# Patient Record
Sex: Male | Born: 1965 | Race: Black or African American | Hispanic: No | Smoking: Current every day smoker
Health system: Southern US, Community
[De-identification: ages and names within clinical notes are randomized; demographics above are authoritative.]

## PROBLEM LIST (undated history)

## (undated) ENCOUNTER — Emergency Department (HOSPITAL_COMMUNITY): Discharge status: Absent without leave | Payer: Medicare Other

## (undated) DIAGNOSIS — M1990 Unspecified osteoarthritis, unspecified site: Secondary | ICD-10-CM

## (undated) DIAGNOSIS — T7840XA Allergy, unspecified, initial encounter: Secondary | ICD-10-CM

## (undated) DIAGNOSIS — I10 Essential (primary) hypertension: Secondary | ICD-10-CM

## (undated) DIAGNOSIS — F419 Anxiety disorder, unspecified: Secondary | ICD-10-CM

## (undated) DIAGNOSIS — R0602 Shortness of breath: Secondary | ICD-10-CM

## (undated) DIAGNOSIS — F1010 Alcohol abuse, uncomplicated: Secondary | ICD-10-CM

## (undated) DIAGNOSIS — F329 Major depressive disorder, single episode, unspecified: Secondary | ICD-10-CM

## (undated) DIAGNOSIS — F32A Depression, unspecified: Secondary | ICD-10-CM

## (undated) DIAGNOSIS — J449 Chronic obstructive pulmonary disease, unspecified: Secondary | ICD-10-CM

## (undated) DIAGNOSIS — J45909 Unspecified asthma, uncomplicated: Secondary | ICD-10-CM

## (undated) HISTORY — PX: BACK SURGERY: SHX140

## (undated) HISTORY — DX: Anxiety disorder, unspecified: F41.9

## (undated) HISTORY — PX: MANDIBLE FRACTURE SURGERY: SHX706

## (undated) HISTORY — DX: Allergy, unspecified, initial encounter: T78.40XA

## (undated) HISTORY — PX: HERNIA REPAIR: SHX51

## (undated) HISTORY — DX: Unspecified osteoarthritis, unspecified site: M19.90

## (undated) HISTORY — PX: HEMORRHOID SURGERY: SHX153

---

## 1998-05-11 ENCOUNTER — Emergency Department (HOSPITAL_COMMUNITY): Admission: EM | Admit: 1998-05-11 | Discharge: 1998-05-11 | Payer: Self-pay | Admitting: Internal Medicine

## 1998-12-08 ENCOUNTER — Emergency Department (HOSPITAL_COMMUNITY): Admission: EM | Admit: 1998-12-08 | Discharge: 1998-12-08 | Payer: Self-pay

## 1999-05-17 ENCOUNTER — Emergency Department (HOSPITAL_COMMUNITY): Admission: EM | Admit: 1999-05-17 | Discharge: 1999-05-18 | Payer: Self-pay | Admitting: Emergency Medicine

## 1999-05-17 ENCOUNTER — Encounter: Payer: Self-pay | Admitting: Emergency Medicine

## 1999-07-27 ENCOUNTER — Ambulatory Visit (HOSPITAL_COMMUNITY): Admission: RE | Admit: 1999-07-27 | Discharge: 1999-07-27 | Payer: Self-pay | Admitting: Orthopedic Surgery

## 1999-07-27 ENCOUNTER — Encounter: Payer: Self-pay | Admitting: Orthopedic Surgery

## 1999-12-21 ENCOUNTER — Emergency Department (HOSPITAL_COMMUNITY): Admission: EM | Admit: 1999-12-21 | Discharge: 1999-12-21 | Payer: Self-pay | Admitting: *Deleted

## 1999-12-30 ENCOUNTER — Encounter (INDEPENDENT_AMBULATORY_CARE_PROVIDER_SITE_OTHER): Payer: Self-pay | Admitting: *Deleted

## 1999-12-30 ENCOUNTER — Ambulatory Visit (HOSPITAL_BASED_OUTPATIENT_CLINIC_OR_DEPARTMENT_OTHER): Admission: RE | Admit: 1999-12-30 | Discharge: 1999-12-30 | Payer: Self-pay | Admitting: General Surgery

## 2000-02-02 ENCOUNTER — Emergency Department (HOSPITAL_COMMUNITY): Admission: EM | Admit: 2000-02-02 | Discharge: 2000-02-02 | Payer: Self-pay | Admitting: Emergency Medicine

## 2000-02-06 ENCOUNTER — Emergency Department (HOSPITAL_COMMUNITY): Admission: EM | Admit: 2000-02-06 | Discharge: 2000-02-06 | Payer: Self-pay | Admitting: *Deleted

## 2000-02-09 ENCOUNTER — Emergency Department (HOSPITAL_COMMUNITY): Admission: EM | Admit: 2000-02-09 | Discharge: 2000-02-09 | Payer: Self-pay | Admitting: Emergency Medicine

## 2000-05-27 ENCOUNTER — Emergency Department (HOSPITAL_COMMUNITY): Admission: EM | Admit: 2000-05-27 | Discharge: 2000-05-27 | Payer: Self-pay | Admitting: Emergency Medicine

## 2000-05-27 ENCOUNTER — Encounter: Payer: Self-pay | Admitting: Emergency Medicine

## 2000-06-24 ENCOUNTER — Emergency Department (HOSPITAL_COMMUNITY): Admission: EM | Admit: 2000-06-24 | Discharge: 2000-06-24 | Payer: Self-pay | Admitting: Emergency Medicine

## 2001-01-07 ENCOUNTER — Emergency Department (HOSPITAL_COMMUNITY): Admission: EM | Admit: 2001-01-07 | Discharge: 2001-01-08 | Payer: Self-pay | Admitting: Emergency Medicine

## 2001-03-13 ENCOUNTER — Emergency Department (HOSPITAL_COMMUNITY): Admission: EM | Admit: 2001-03-13 | Discharge: 2001-03-13 | Payer: Self-pay | Admitting: Internal Medicine

## 2001-04-30 ENCOUNTER — Emergency Department (HOSPITAL_COMMUNITY): Admission: EM | Admit: 2001-04-30 | Discharge: 2001-04-30 | Payer: Self-pay | Admitting: Emergency Medicine

## 2001-04-30 ENCOUNTER — Encounter: Payer: Self-pay | Admitting: Emergency Medicine

## 2001-10-19 ENCOUNTER — Emergency Department (HOSPITAL_COMMUNITY): Admission: EM | Admit: 2001-10-19 | Discharge: 2001-10-19 | Payer: Self-pay | Admitting: *Deleted

## 2002-04-11 ENCOUNTER — Emergency Department (HOSPITAL_COMMUNITY): Admission: EM | Admit: 2002-04-11 | Discharge: 2002-04-11 | Payer: Self-pay | Admitting: Emergency Medicine

## 2002-06-02 ENCOUNTER — Emergency Department (HOSPITAL_COMMUNITY): Admission: EM | Admit: 2002-06-02 | Discharge: 2002-06-02 | Payer: Self-pay | Admitting: Emergency Medicine

## 2002-06-11 ENCOUNTER — Emergency Department (HOSPITAL_COMMUNITY): Admission: EM | Admit: 2002-06-11 | Discharge: 2002-06-11 | Payer: Self-pay | Admitting: Emergency Medicine

## 2002-10-02 ENCOUNTER — Emergency Department (HOSPITAL_COMMUNITY): Admission: EM | Admit: 2002-10-02 | Discharge: 2002-10-02 | Payer: Self-pay | Admitting: Emergency Medicine

## 2002-12-18 ENCOUNTER — Encounter: Payer: Self-pay | Admitting: Emergency Medicine

## 2002-12-18 ENCOUNTER — Inpatient Hospital Stay (HOSPITAL_COMMUNITY): Admission: EM | Admit: 2002-12-18 | Discharge: 2002-12-20 | Payer: Self-pay | Admitting: Emergency Medicine

## 2002-12-19 ENCOUNTER — Encounter: Payer: Self-pay | Admitting: Oral Surgery

## 2003-02-02 ENCOUNTER — Encounter: Payer: Self-pay | Admitting: Emergency Medicine

## 2003-02-02 ENCOUNTER — Emergency Department (HOSPITAL_COMMUNITY): Admission: EM | Admit: 2003-02-02 | Discharge: 2003-02-02 | Payer: Self-pay | Admitting: Unknown Physician Specialty

## 2003-08-12 ENCOUNTER — Emergency Department (HOSPITAL_COMMUNITY): Admission: EM | Admit: 2003-08-12 | Discharge: 2003-08-12 | Payer: Self-pay | Admitting: Emergency Medicine

## 2003-08-20 ENCOUNTER — Emergency Department (HOSPITAL_COMMUNITY): Admission: EM | Admit: 2003-08-20 | Discharge: 2003-08-20 | Payer: Self-pay | Admitting: Emergency Medicine

## 2008-08-27 ENCOUNTER — Emergency Department (HOSPITAL_COMMUNITY): Admission: EM | Admit: 2008-08-27 | Discharge: 2008-08-27 | Payer: Self-pay | Admitting: Emergency Medicine

## 2009-08-19 ENCOUNTER — Emergency Department (HOSPITAL_COMMUNITY): Admission: EM | Admit: 2009-08-19 | Discharge: 2009-08-19 | Payer: Self-pay | Admitting: Emergency Medicine

## 2009-08-24 ENCOUNTER — Emergency Department (HOSPITAL_COMMUNITY): Admission: EM | Admit: 2009-08-24 | Discharge: 2009-08-24 | Payer: Self-pay | Admitting: Emergency Medicine

## 2010-01-24 ENCOUNTER — Emergency Department (HOSPITAL_COMMUNITY): Admission: EM | Admit: 2010-01-24 | Discharge: 2010-01-25 | Payer: Self-pay | Admitting: Emergency Medicine

## 2010-01-28 ENCOUNTER — Emergency Department (HOSPITAL_COMMUNITY): Admission: EM | Admit: 2010-01-28 | Discharge: 2010-01-28 | Payer: Self-pay | Admitting: Emergency Medicine

## 2010-02-04 ENCOUNTER — Emergency Department (HOSPITAL_COMMUNITY): Admission: EM | Admit: 2010-02-04 | Discharge: 2010-02-05 | Payer: Self-pay | Admitting: Emergency Medicine

## 2010-02-14 ENCOUNTER — Emergency Department (HOSPITAL_COMMUNITY): Admission: EM | Admit: 2010-02-14 | Discharge: 2010-02-14 | Payer: Self-pay | Admitting: Emergency Medicine

## 2010-03-18 ENCOUNTER — Emergency Department (HOSPITAL_COMMUNITY): Admission: EM | Admit: 2010-03-18 | Discharge: 2010-03-19 | Payer: Self-pay | Admitting: Emergency Medicine

## 2010-07-21 ENCOUNTER — Emergency Department (HOSPITAL_COMMUNITY)
Admission: EM | Admit: 2010-07-21 | Discharge: 2010-07-21 | Payer: Self-pay | Source: Home / Self Care | Admitting: Emergency Medicine

## 2010-11-05 LAB — BASIC METABOLIC PANEL
CO2: 27 mEq/L (ref 19–32)
Calcium: 8.8 mg/dL (ref 8.4–10.5)
GFR calc Af Amer: >60 mL/min (ref >60)
GFR calc non Af Amer: >60 mL/min (ref >60)
Glucose, Bld: 79 mg/dL (ref 70–99)
Potassium: 3.7 mEq/L (ref 3.5–5.1)
Sodium: 143 mEq/L (ref 135–145)

## 2010-11-05 LAB — DIFFERENTIAL
Lymphs Abs: 1.8 10*3/uL (ref 0.7–4.0)
Monocytes Absolute: 0.4 10*3/uL (ref 0.1–1.0)
Monocytes Relative: 8 % (ref 3–12)
Neutro Abs: 2.2 10*3/uL (ref 1.7–7.7)
Neutrophils Relative %: 49 % (ref 43–77)

## 2010-11-05 LAB — RAPID URINE DRUG SCREEN, HOSP PERFORMED
Cocaine: NOT DETECTED
Opiates: NOT DETECTED
Tetrahydrocannabinol: NOT DETECTED

## 2010-11-05 LAB — CBC
HCT: 39.7 % (ref 39.0–52.0)
Hemoglobin: 13.8 g/dL (ref 13.0–17.0)
MCH: 35 pg — ABNORMAL HIGH (ref 26.0–34.0)
MCHC: 34.9 g/dL (ref 30.0–36.0)
RBC: 3.96 MIL/uL — ABNORMAL LOW (ref 4.22–5.81)

## 2010-11-06 LAB — BASIC METABOLIC PANEL
BUN: 11 mg/dL (ref 6–23)
CO2: 25 mEq/L (ref 19–32)
Calcium: 9.1 mg/dL (ref 8.4–10.5)
Creatinine, Ser: 0.68 mg/dL (ref 0.4–1.5)
GFR calc non Af Amer: >60 mL/min (ref >60)
Glucose, Bld: 80 mg/dL (ref 70–99)

## 2010-11-06 LAB — HEPATIC FUNCTION PANEL
AST: 37 U/L (ref 0–37)
Albumin: 4.5 g/dL (ref 3.5–5.2)
Bilirubin, Direct: 0.1 mg/dL (ref 0.0–0.3)
Total Protein: 7.8 g/dL (ref 6.0–8.3)

## 2010-11-06 LAB — DIFFERENTIAL
Basophils Absolute: 0 10*3/uL (ref 0.0–0.1)
Basophils Relative: 0 % (ref 0–1)
Lymphocytes Relative: 24 % (ref 12–46)
Monocytes Absolute: 0.4 10*3/uL (ref 0.1–1.0)
Neutro Abs: 4.5 10*3/uL (ref 1.7–7.7)
Neutrophils Relative %: 68 % (ref 43–77)

## 2010-11-06 LAB — CBC
MCHC: 33.7 g/dL (ref 30.0–36.0)
Platelets: 282 10*3/uL (ref 150–400)
RDW: 14.2 % (ref 11.5–15.5)

## 2010-11-06 LAB — RAPID URINE DRUG SCREEN, HOSP PERFORMED
Amphetamines: NOT DETECTED
Barbiturates: NOT DETECTED
Benzodiazepines: NOT DETECTED
Opiates: NOT DETECTED
Tetrahydrocannabinol: POSITIVE — AB

## 2010-11-07 LAB — CBC
Hemoglobin: 11.6 g/dL — ABNORMAL LOW (ref 13.0–17.0)
MCHC: 33.9 g/dL (ref 30.0–36.0)
MCV: 103.6 fL — ABNORMAL HIGH (ref 78.0–100.0)
RBC: 3.3 MIL/uL — ABNORMAL LOW (ref 4.22–5.81)

## 2010-11-07 LAB — DIFFERENTIAL
Basophils Relative: 0 % (ref 0–1)
Eosinophils Absolute: 0.1 10*3/uL (ref 0.0–0.7)
Monocytes Absolute: 0.4 10*3/uL (ref 0.1–1.0)
Monocytes Relative: 8 % (ref 3–12)

## 2010-11-07 LAB — BASIC METABOLIC PANEL
CO2: 29 mEq/L (ref 19–32)
Chloride: 102 mEq/L (ref 96–112)
Creatinine, Ser: 0.69 mg/dL (ref 0.4–1.5)
GFR calc Af Amer: >60 mL/min (ref >60)
Glucose, Bld: 89 mg/dL (ref 70–99)
Sodium: 137 mEq/L (ref 135–145)

## 2010-11-07 LAB — RAPID URINE DRUG SCREEN, HOSP PERFORMED
Amphetamines: NOT DETECTED
Barbiturates: NOT DETECTED
Cocaine: NOT DETECTED
Opiates: NOT DETECTED

## 2010-11-18 ENCOUNTER — Emergency Department (HOSPITAL_COMMUNITY)
Admission: EM | Admit: 2010-11-18 | Discharge: 2010-11-18 | Disposition: A | Discharge status: Home / Self Care | Payer: Self-pay | Attending: Emergency Medicine | Admitting: Emergency Medicine

## 2010-11-18 DIAGNOSIS — F329 Major depressive disorder, single episode, unspecified: Secondary | ICD-10-CM | POA: Insufficient documentation

## 2010-11-18 DIAGNOSIS — K644 Residual hemorrhoidal skin tags: Secondary | ICD-10-CM | POA: Insufficient documentation

## 2010-11-18 DIAGNOSIS — F3289 Other specified depressive episodes: Secondary | ICD-10-CM | POA: Insufficient documentation

## 2010-11-18 DIAGNOSIS — K625 Hemorrhage of anus and rectum: Secondary | ICD-10-CM | POA: Insufficient documentation

## 2010-11-18 LAB — COMPREHENSIVE METABOLIC PANEL
AST: 52 U/L — ABNORMAL HIGH (ref 0–37)
Albumin: 4 g/dL (ref 3.5–5.2)
CO2: 28 mEq/L (ref 19–32)
Calcium: 8.7 mg/dL (ref 8.4–10.5)
Creatinine, Ser: 0.67 mg/dL (ref 0.4–1.5)
GFR calc Af Amer: >60 mL/min (ref >60)
GFR calc non Af Amer: >60 mL/min (ref >60)

## 2010-11-18 LAB — OCCULT BLOOD, POC DEVICE: Fecal Occult Bld: POSITIVE

## 2010-11-18 LAB — DIFFERENTIAL
Basophils Absolute: 0 10*3/uL (ref 0.0–0.1)
Basophils Relative: 1 % (ref 0–1)
Eosinophils Absolute: 0.2 10*3/uL (ref 0.0–0.7)
Eosinophils Relative: 5 % (ref 0–5)
Monocytes Absolute: 0.4 10*3/uL (ref 0.1–1.0)

## 2010-11-18 LAB — CBC
MCHC: 36.1 g/dL — ABNORMAL HIGH (ref 30.0–36.0)
RDW: 14 % (ref 11.5–15.5)

## 2010-11-19 ENCOUNTER — Emergency Department (HOSPITAL_COMMUNITY)
Admission: EM | Admit: 2010-11-19 | Discharge: 2010-11-19 | Disposition: A | Discharge status: Home / Self Care | Payer: Self-pay | Attending: Emergency Medicine | Admitting: Emergency Medicine

## 2010-11-19 DIAGNOSIS — K645 Perianal venous thrombosis: Secondary | ICD-10-CM | POA: Insufficient documentation

## 2010-11-19 DIAGNOSIS — K6289 Other specified diseases of anus and rectum: Secondary | ICD-10-CM | POA: Insufficient documentation

## 2010-11-19 DIAGNOSIS — K5900 Constipation, unspecified: Secondary | ICD-10-CM | POA: Insufficient documentation
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
## 2011-01-06 NOTE — Op Note (Signed)
[place]  Patient:    [name], [name]                     MRN: [id number] Proc. Date: 12/30/99 Adm. Date:  [id number] Disc. Date: [id number] Attending:  Ephriam Knuckles H                           Operative Report  PREOPERATIVE DIAGNOSIS:  Bleeding and painful hemorrhoids.  POSTOPERATIVE DIAGNOSIS:  Bleeding and painful hemorrhoids.  PROCEDURE:  Complete hemorrhoidectomy.  SURGEON:  Adolph Pollack, M.D.  ASSISTANT:  ANESTHESIA:  General.  INDICATIONS:  [name] is a 45 year old man who has had problems with hemorrhoids now that have been escalating.  These lead to bleeding, pain, and itching.  He has tried medical therapy, but this has failed and he is now here for hemorrhoidectomy.  He has significant hemorrhoidal disease in all three areas.  DESCRIPTION OF PROCEDURE:  He was placed supine on the operating table and a general anesthetic was administered.  He was then placed in the lithotomy position.  The perianal area was sterilely prepped and draped.  A left lateral hemorrhoid was grasped by its external component and retracted laterally after introduction of an anal speculum.  The area of excision of the hemorrhoid was marked with the cautery and then local anesthetic consisting of 0.5% Marcaine with epinephrine was infiltrated and the hemorrhoid was excised sharply.  The mucosa and anoderm were subsequently approximated after hemostasis was obtained with the cautery using a running locking 3-0 chromic suture.  Next, the right posterolateral area was approached and likewise had a sizeable hemorrhoid both in an internal and external component.  The external component was grasped and retracted laterally.  Using the Bovie, the areas of excision were marked and then ______ was infiltrated.  The hemorrhoid was then excised sharply.  The hemorrhoidal pedicle was ligated with 3-0 chromic suture and the mucosa and anoderm were closed  with a running 3-0 chromic suture.  Finally the right anterolateral hemorrhoid was identified with both an internal and external component.  The area of excision was marked with the cautery.  Then local anesthetic was infiltrated.  The hemorrhoidal pedicle was ligated with a 3-0 chromic suture and then the hemorrhoid was excised sharply. The mucosa and anoderm were closed with a running locking 3-0 chromic suture.  At the end of this procedure, he did not appear to have a significant anal tightening.  Everything was hemostatic.  A piece of Gelfoam was placed in the distal anus and a bulky dressing applied.  He tolerated the procedure well without any apparent complications and was taken to the recovery room in satisfactory condition. DD:  02/07/00 TD:  02/09/00 Job: [id number]

## 2011-01-13 ENCOUNTER — Inpatient Hospital Stay (INDEPENDENT_AMBULATORY_CARE_PROVIDER_SITE_OTHER)
Admission: RE | Admit: 2011-01-13 | Discharge: 2011-01-13 | Disposition: A | Discharge status: Home / Self Care | Payer: Self-pay | Source: Ambulatory Visit | Attending: Family Medicine | Admitting: Family Medicine

## 2011-01-13 DIAGNOSIS — K602 Anal fissure, unspecified: Secondary | ICD-10-CM

## 2011-02-05 ENCOUNTER — Inpatient Hospital Stay (INDEPENDENT_AMBULATORY_CARE_PROVIDER_SITE_OTHER)
Admission: RE | Admit: 2011-02-05 | Discharge: 2011-02-05 | Disposition: A | Discharge status: Home / Self Care | Payer: Self-pay | Source: Ambulatory Visit | Attending: Family Medicine | Admitting: Family Medicine

## 2011-02-05 DIAGNOSIS — K649 Unspecified hemorrhoids: Secondary | ICD-10-CM

## 2011-02-23 ENCOUNTER — Emergency Department (HOSPITAL_COMMUNITY): Payer: Self-pay

## 2011-02-23 ENCOUNTER — Emergency Department (HOSPITAL_COMMUNITY)
Admission: EM | Admit: 2011-02-23 | Discharge: 2011-02-23 | Disposition: A | Discharge status: Home / Self Care | Payer: Self-pay | Attending: Emergency Medicine | Admitting: Emergency Medicine

## 2011-02-23 DIAGNOSIS — G319 Degenerative disease of nervous system, unspecified: Secondary | ICD-10-CM | POA: Insufficient documentation

## 2011-02-23 DIAGNOSIS — R220 Localized swelling, mass and lump, head: Secondary | ICD-10-CM | POA: Insufficient documentation

## 2011-02-23 DIAGNOSIS — F17200 Nicotine dependence, unspecified, uncomplicated: Secondary | ICD-10-CM | POA: Insufficient documentation

## 2011-02-23 DIAGNOSIS — R221 Localized swelling, mass and lump, neck: Secondary | ICD-10-CM | POA: Insufficient documentation

## 2011-02-23 DIAGNOSIS — S060XAA Concussion with loss of consciousness status unknown, initial encounter: Secondary | ICD-10-CM | POA: Insufficient documentation

## 2011-02-23 DIAGNOSIS — S0180XA Unspecified open wound of other part of head, initial encounter: Secondary | ICD-10-CM | POA: Insufficient documentation

## 2011-02-23 DIAGNOSIS — S060X9A Concussion with loss of consciousness of unspecified duration, initial encounter: Secondary | ICD-10-CM | POA: Insufficient documentation

## 2012-01-22 ENCOUNTER — Encounter (HOSPITAL_COMMUNITY): Payer: Self-pay

## 2012-01-22 ENCOUNTER — Emergency Department (HOSPITAL_COMMUNITY)
Admission: EM | Admit: 2012-01-22 | Discharge: 2012-01-22 | Disposition: A | Discharge status: Home / Self Care | Payer: Self-pay | Attending: Emergency Medicine | Admitting: Emergency Medicine

## 2012-01-22 ENCOUNTER — Emergency Department (HOSPITAL_COMMUNITY): Payer: Self-pay

## 2012-01-22 DIAGNOSIS — M79609 Pain in unspecified limb: Secondary | ICD-10-CM | POA: Insufficient documentation

## 2012-01-22 DIAGNOSIS — F17200 Nicotine dependence, unspecified, uncomplicated: Secondary | ICD-10-CM | POA: Insufficient documentation

## 2012-01-22 DIAGNOSIS — M109 Gout, unspecified: Secondary | ICD-10-CM

## 2012-01-22 MED ORDER — DEXAMETHASONE SODIUM PHOSPHATE 10 MG/ML IJ SOLN
10.0000 mg | Freq: Once | INTRAMUSCULAR | Status: AC
  Administered 2012-01-22: 10 mg via INTRAMUSCULAR
  Filled 2012-01-22: qty 1

## 2012-01-22 MED ORDER — OXYCODONE-ACETAMINOPHEN 5-325 MG PO TABS
1.0000 | ORAL_TABLET | Freq: Once | ORAL | Status: AC
  Administered 2012-01-22: 1 via ORAL
  Filled 2012-01-22: qty 1

## 2012-01-22 MED ORDER — KETOROLAC TROMETHAMINE 60 MG/2ML IM SOLN
60.0000 mg | Freq: Once | INTRAMUSCULAR | Status: AC
  Administered 2012-01-22: 60 mg via INTRAMUSCULAR
  Filled 2012-01-22: qty 2

## 2012-01-22 MED ORDER — INDOMETHACIN 25 MG PO CAPS: 25.0000 mg | ORAL_CAPSULE | Freq: Three times a day (TID) | ORAL | Status: AC | PRN

## 2012-01-22 MED ORDER — HYDROCODONE-ACETAMINOPHEN 5-325 MG PO TABS: 1.0000 | ORAL_TABLET | Freq: Four times a day (QID) | ORAL | Status: AC | PRN

## 2012-01-22 NOTE — ED Provider Notes (Signed)
Medical screening examination/treatment/procedure(s) were performed by non-physician practitioner and as supervising physician I was immediately available for consultation/collaboration.   Koah Chisenhall, MD 01/22/12 1803 

## 2012-01-22 NOTE — ED Provider Notes (Signed)
History     CSN: [id number]  Arrival date & time 01/22/12  1128   First MD Initiated Contact with Patient 01/22/12 1211       12:37 PM HPI Patient reports pain in his left great toe that began last night. Reports swelling and redness. Denies known injury. Reports pain radiates to the entire foot. States very painful to ambulate.  Patient is a 46 y.o. male presenting with toe pain. The history is provided by the patient.  Toe Pain This is a new problem. The current episode started yesterday. The problem has been gradually worsening. Pertinent negatives include no fever, nausea, neck pain, numbness, vomiting or weakness. The symptoms are aggravated by walking and standing. He has tried nothing for the symptoms.    No past medical history on file.  No past surgical history on file.  No family history on file.  History  Substance Use Topics  . Smoking status: Current Everyday Smoker  . Smokeless tobacco: Not on file  . Alcohol Use: Yes      Review of Systems  Constitutional: Negative for fever.  HENT: Negative for neck pain.   Gastrointestinal: Negative for nausea and vomiting.  Musculoskeletal:       Toe pain  Skin: Negative for wound.  Neurological: Negative for weakness and numbness.  All other systems reviewed and are negative.    Allergies  Review of patient's allergies indicates no known allergies.  Home Medications  No current outpatient prescriptions on file.  BP 108/77  Pulse 89  Temp(Src) 98.1 F (36.7 C) (Oral)  Resp 18  SpO2 95%  Physical Exam  Constitutional: He is oriented to person, place, and time. He appears well-developed and well-nourished.  HENT:  Head: Normocephalic and atraumatic.  Eyes: Pupils are equal, round, and reactive to light.  Musculoskeletal:       Left great toe: erythema and edema of left MTP. TTP. Full ROM of foot and ankle. NML sensation and pedal pulse. Cap refill <2 secs  Neurological: He is alert and oriented to  person, place, and time.  Skin: Skin is warm and dry. No rash noted. No erythema. No pallor.  Psychiatric: He has a normal mood and affect. His behavior is normal.    ED Course  Procedures  Results for orders placed during the hospital encounter of 11/18/10  DIFFERENTIAL      Component Value Range   Neutrophils Relative 46  43 - 77 (%)   Neutro Abs 1.8  1.7 - 7.7 (K/uL)   Lymphocytes Relative 38  12 - 46 (%)   Lymphs Abs 1.4  0.7 - 4.0 (K/uL)   Monocytes Relative 11  3 - 12 (%)   Monocytes Absolute 0.4  0.1 - 1.0 (K/uL)   Eosinophils Relative 5  0 - 5 (%)   Eosinophils Absolute 0.2  0.0 - 0.7 (K/uL)   Basophils Relative 1  0 - 1 (%)   Basophils Absolute 0.0  0.0 - 0.1 (K/uL)  CBC      Component Value Range   WBC 3.8 (*) 4.0 - 10.5 (K/uL)   RBC 3.74 (*) 4.22 - 5.81 (MIL/uL)   Hemoglobin 13.3  13.0 - 17.0 (g/dL)   HCT 30.8 (*) 65.7 - 52.0 (%)   MCV 98.4  78.0 - 100.0 (fL)   MCH 35.6 (*) 26.0 - 34.0 (pg)   MCHC 36.1 (*) 30.0 - 36.0 (g/dL)   RDW 84.6  96.2 - 95.2 (%)   Platelets 210  150 -  400 (K/uL)  OCCULT BLOOD, POC      Component Value Range   Fecal Occult Bld POSITIVE    COMPREHENSIVE METABOLIC PANEL      Component Value Range   Sodium 147 (*) 135 - 145 (mEq/L)   Potassium 3.6  3.5 - 5.1 (mEq/L)   Chloride 109  96 - 112 (mEq/L)   CO2 28  19 - 32 (mEq/L)   Glucose, Bld 88  70 - 99 (mg/dL)   BUN 4 (*) 6 - 23 (mg/dL)   Creatinine, Ser 4.09  0.4 - 1.5 (mg/dL)   Calcium 8.7  8.4 - 81.1 (mg/dL)   Total Protein 7.1  6.0 - 8.3 (g/dL)   Albumin 4.0  3.5 - 5.2 (g/dL)   AST 52 (*) 0 - 37 (U/L)   ALT 43  0 - 53 (U/L)   Alkaline Phosphatase 52  39 - 117 (U/L)   Total Bilirubin 0.4  0.3 - 1.2 (mg/dL)   GFR calc non Af Amer >60  >60 (mL/min)   GFR calc Af Amer    >60 (mL/min)   Value: >60            The eGFR has been calculated     using the MDRD equation.     This calculation has not been     validated in all clinical     situations.     eGFR's persistently     <60  mL/min signify     possible Chronic Kidney Disease.   Dg Foot Complete Left  01/22/2012  *RADIOLOGY REPORT*  Clinical Data: Pain and distal left great toe since yesterday.  No known injury.  LEFT FOOT - COMPLETE 3+ VIEW  Comparison: None.  Findings: The joints are aligned.  The second through fifth toes are extended at the midportion of phalangeal joints, and flexed at the metatarsophalangeal joints.  There are degenerative changes at the first metatarsophalangeal joint, evidenced by joint space narrowing, subchondral sclerosis, and osteophyte formation. Negative for fracture.  No discrete soft tissue swelling appreciated.  IMPRESSION:  1.  Degenerative changes of the first metatarsophalangeal joint. 2.  No acute bony abnormality.  Original Report Authenticated By: Britta Mccreedy, M.D.     MDM    First MTP is edematous, erythematous, warm to touch. Suspect patient has gout. Will treat patient with indomethacin and analgesics advised followup primary care provider further evaluation if needed. Patient voices understanding and is ready for discharge      Thomasene Lot, Cordelia Poche 01/22/12 1326

## 2012-01-22 NOTE — ED Notes (Signed)
sts toe pain and swelling onset last night and worse today on right great toe

## 2012-01-22 NOTE — Progress Notes (Signed)
Orthopedic Tech Progress Note Patient Details:  [name] 1966/05/22 [id number]  Ortho Devices Type of Ortho Device: Crutches Ortho Device/Splint Interventions: Application   Layli Capshaw T 01/22/2012, 2:16 PM

## 2012-04-22 ENCOUNTER — Emergency Department (HOSPITAL_COMMUNITY)
Admission: EM | Admit: 2012-04-22 | Discharge: 2012-04-22 | Disposition: A | Discharge status: Home / Self Care | Payer: Self-pay | Attending: Emergency Medicine | Admitting: Emergency Medicine

## 2012-04-22 ENCOUNTER — Encounter (HOSPITAL_COMMUNITY): Payer: Self-pay | Admitting: Emergency Medicine

## 2012-04-22 DIAGNOSIS — R195 Other fecal abnormalities: Secondary | ICD-10-CM | POA: Insufficient documentation

## 2012-04-22 DIAGNOSIS — M79601 Pain in right arm: Secondary | ICD-10-CM

## 2012-04-22 DIAGNOSIS — F17200 Nicotine dependence, unspecified, uncomplicated: Secondary | ICD-10-CM | POA: Insufficient documentation

## 2012-04-22 DIAGNOSIS — M79609 Pain in unspecified limb: Secondary | ICD-10-CM | POA: Insufficient documentation

## 2012-04-22 MED ORDER — OXYCODONE-ACETAMINOPHEN 5-325 MG PO TABS
2.0000 | ORAL_TABLET | Freq: Once | ORAL | Status: AC
  Administered 2012-04-22: 2 via ORAL
  Filled 2012-04-22: qty 2

## 2012-04-22 MED ORDER — OXYCODONE-ACETAMINOPHEN 5-325 MG PO TABS: 1.0000 | ORAL_TABLET | Freq: Four times a day (QID) | ORAL | Status: AC | PRN

## 2012-04-22 NOTE — ED Provider Notes (Signed)
Medical screening examination/treatment/procedure(s) were performed by non-physician practitioner and as supervising physician I was immediately available for consultation/collaboration.   Laray Anger, DO 04/22/12 1933

## 2012-04-22 NOTE — ED Provider Notes (Signed)
History     CSN: [id number]  Arrival date & time 04/22/12  0547   First MD Initiated Contact with Patient 04/22/12 0800      Chief Complaint  Patient presents with  . Arm Pain    (Consider location/radiation/quality/duration/timing/severity/associated sxs/prior treatment) HPI Comments: [name] 46 y.o. male   The chief complaint is: Patient presents with:   Arm Pain   The patient has medical history significant for:   History reviewed. No pertinent past medical history.  Patient presents with a 4 week history of arm pain. He states that the pain is constant, 7/10, without radiation or transmission. Patient has taken 4 BC powder and 4 Advil a day with resolution. Patient states that this has caused him some bright red blood in his stool. Patient denies fever, chills, or night sweats. Denies NVD.       The history is provided by the patient.    History reviewed. No pertinent past medical history.  Past Surgical History  Procedure Date  . Hemorrhoid surgery   . Mandible fracture surgery   . Back surgery     No family history on file.  History  Substance Use Topics  . Smoking status: Current Everyday Smoker -- 1.5 packs/day  . Smokeless tobacco: Not on file  . Alcohol Use: 63.0 oz/week    105 Cans of beer per week      Review of Systems  Constitutional: Negative for fever, chills and diaphoresis.  Gastrointestinal: Positive for anal bleeding. Negative for nausea, vomiting, abdominal pain and diarrhea.  Musculoskeletal: Positive for myalgias and arthralgias.  All other systems reviewed and are negative.    Allergies  Review of patient's allergies indicates no known allergies.  Home Medications   Current Outpatient Rx  Name Route Sig Dispense Refill  . ASPIRIN-SALICYLAMIDE-CAFFEINE 650-195-33.3 MG PO PACK Oral Take 1 packet by mouth every 6 (six) hours as needed.    . IBUPROFEN 200 MG PO TABS Oral Take 800 mg by mouth every 6 (six) hours as  needed. For pain    . INDOMETHACIN 25 MG PO CAPS Oral Take 1 capsule (25 mg total) by mouth 3 (three) times daily as needed. 30 capsule 0    BP 117/81  Pulse 94  Temp 97.6 F (36.4 C) (Oral)  Resp 20  Ht 5\' 7"  (1.702 m)  Wt 130 lb (58.968 kg)  BMI 20.36 kg/m2  SpO2 98%  Physical Exam  Nursing note and vitals reviewed. Constitutional: He appears well-developed and well-nourished.  HENT:  Head: Normocephalic and atraumatic.  Mouth/Throat: Oropharynx is clear and moist.  Eyes: Conjunctivae and EOM are normal. No scleral icterus.  Neck: Normal range of motion.  Cardiovascular: Normal rate, regular rhythm and normal heart sounds.   Pulmonary/Chest: Effort normal and breath sounds normal.  Abdominal: Soft. Bowel sounds are normal. There is no tenderness.  Musculoskeletal: He exhibits tenderness.       Patient has pain with abduction, adduction, internal and external rotation.  Tenderness to pa;pation of the right bicep.  Neurological: He is alert.  Skin: Skin is warm and dry.    ED Course  Procedures (including critical care time)  Labs Reviewed - No data to display No results found. Results for orders placed during the hospital encounter of 11/18/10  DIFFERENTIAL      Component Value Range   Neutrophils Relative 46  43 - 77 %   Neutro Abs 1.8  1.7 - 7.7 K/uL   Lymphocytes Relative 38  12 - 46 %   Lymphs Abs 1.4  0.7 - 4.0 K/uL   Monocytes Relative 11  3 - 12 %   Monocytes Absolute 0.4  0.1 - 1.0 K/uL   Eosinophils Relative 5  0 - 5 %   Eosinophils Absolute 0.2  0.0 - 0.7 K/uL   Basophils Relative 1  0 - 1 %   Basophils Absolute 0.0  0.0 - 0.1 K/uL  CBC      Component Value Range   WBC 3.8 (*) 4.0 - 10.5 K/uL   RBC 3.74 (*) 4.22 - 5.81 MIL/uL   Hemoglobin 13.3  13.0 - 17.0 g/dL   HCT 16.1 (*) 09.6 - 04.5 %   MCV 98.4  78.0 - 100.0 fL   MCH 35.6 (*) 26.0 - 34.0 pg   MCHC 36.1 (*) 30.0 - 36.0 g/dL   RDW 40.9  81.1 - 91.4 %   Platelets 210  150 - 400 K/uL  OCCULT  BLOOD, POC      Component Value Range   Fecal Occult Bld POSITIVE    COMPREHENSIVE METABOLIC PANEL      Component Value Range   Sodium 147 (*) 135 - 145 mEq/L   Potassium 3.6  3.5 - 5.1 mEq/L   Chloride 109  96 - 112 mEq/L   CO2 28  19 - 32 mEq/L   Glucose, Bld 88  70 - 99 mg/dL   BUN 4 (*) 6 - 23 mg/dL   Creatinine, Ser 7.82  0.4 - 1.5 mg/dL   Calcium 8.7  8.4 - 95.6 mg/dL   Total Protein 7.1  6.0 - 8.3 g/dL   Albumin 4.0  3.5 - 5.2 g/dL   AST 52 (*) 0 - 37 U/L   ALT 43  0 - 53 U/L   Alkaline Phosphatase 52  39 - 117 U/L   Total Bilirubin 0.4  0.3 - 1.2 mg/dL   GFR calc non Af Amer >60  >60 mL/min   GFR calc Af Amer    >60 mL/min   Value: >60            The eGFR has been calculated     using the MDRD equation.     This calculation has not been     validated in all clinical     situations.     eGFR's persistently     <60 mL/min signify     possible Chronic Kidney Disease.     1. Arm pain, right       MDM  Patient presented with a 4 week history of right arm pain. Patient given pain medication in ED with improvement upon reassessment, Patient discharged on pain medication with referral to orthopedics. Physical exam findings suspicious for biceps tendinitis. Return precautions given. No red flags for rupture bicep tendon, cellulitis, or septic arthritis.        Pixie Casino, PA-C 04/22/12 [phone]

## 2012-04-22 NOTE — ED Notes (Signed)
Pt presents to ED c/o R arm pain. Pt states that his arm has been hurting for approx 4 weeks. Pt denies fall or injury. Pt reports that he came here today because he is tired of hurting. Pt also reports that he has taken "so many BC's that I bleed when I go to the bathroom." Pt last BM was around 2:30am with "blood in it." Pt denies SOB, but states that he has had been more tired lately. Pt alert and in NAD.

## 2012-04-23 LAB — POCT I-STAT, CHEM 8
Calcium, Ion: 1.15 mmol/L (ref 1.12–1.23)
Creatinine, Ser: 0.9 mg/dL (ref 0.50–1.35)
Glucose, Bld: 85 mg/dL (ref 70–99)
Hemoglobin: 15 g/dL (ref 13.0–17.0)
Potassium: 4.1 mEq/L (ref 3.5–5.1)

## 2012-09-01 ENCOUNTER — Emergency Department (HOSPITAL_COMMUNITY)
Admission: EM | Admit: 2012-09-01 | Discharge: 2012-09-02 | Disposition: A | Discharge status: Home / Self Care | Payer: Self-pay | Attending: Emergency Medicine | Admitting: Emergency Medicine

## 2012-09-01 ENCOUNTER — Encounter (HOSPITAL_COMMUNITY): Payer: Self-pay | Admitting: Emergency Medicine

## 2012-09-01 DIAGNOSIS — F1010 Alcohol abuse, uncomplicated: Secondary | ICD-10-CM | POA: Insufficient documentation

## 2012-09-01 DIAGNOSIS — F17200 Nicotine dependence, unspecified, uncomplicated: Secondary | ICD-10-CM | POA: Insufficient documentation

## 2012-09-01 LAB — CBC
Hemoglobin: 13.2 g/dL (ref 13.0–17.0)
MCH: 35.6 pg — ABNORMAL HIGH (ref 26.0–34.0)
MCV: 98.9 fL (ref 78.0–100.0)
Platelets: 206 10*3/uL (ref 150–400)
RBC: 3.71 MIL/uL — ABNORMAL LOW (ref 4.22–5.81)

## 2012-09-01 LAB — COMPREHENSIVE METABOLIC PANEL
CO2: 23 mEq/L (ref 19–32)
Calcium: 9 mg/dL (ref 8.4–10.5)
Creatinine, Ser: 0.55 mg/dL (ref 0.50–1.35)
GFR calc Af Amer: >90 mL/min (ref >90)
GFR calc non Af Amer: >90 mL/min (ref >90)
Glucose, Bld: 80 mg/dL (ref 70–99)

## 2012-09-01 LAB — RAPID URINE DRUG SCREEN, HOSP PERFORMED: Opiates: NOT DETECTED

## 2012-09-01 LAB — SALICYLATE LEVEL: Salicylate Lvl: 19.6 mg/dL (ref 2.8–20.0)

## 2012-09-01 MED ORDER — ZOLPIDEM TARTRATE 5 MG PO TABS: 5.0000 mg | ORAL_TABLET | Freq: Every evening | ORAL | Status: DC | PRN

## 2012-09-01 MED ORDER — ACETAMINOPHEN 325 MG PO TABS: 650.0000 mg | ORAL_TABLET | ORAL | Status: DC | PRN

## 2012-09-01 MED ORDER — THIAMINE HCL 100 MG/ML IJ SOLN: 100.0000 mg | Freq: Every day | INTRAMUSCULAR | Status: DC

## 2012-09-01 MED ORDER — FOLIC ACID 1 MG PO TABS
1.0000 mg | ORAL_TABLET | Freq: Every day | ORAL | Status: DC
  Administered 2012-09-01: 1 mg via ORAL
  Filled 2012-09-01: qty 1

## 2012-09-01 MED ORDER — ONDANSETRON HCL 4 MG PO TABS: 4.0000 mg | ORAL_TABLET | Freq: Three times a day (TID) | ORAL | Status: DC | PRN

## 2012-09-01 MED ORDER — ADULT MULTIVITAMIN W/MINERALS CH
1.0000 | ORAL_TABLET | Freq: Every day | ORAL | Status: DC
  Administered 2012-09-01: 1 via ORAL
  Filled 2012-09-01: qty 1

## 2012-09-01 MED ORDER — NICOTINE 21 MG/24HR TD PT24
21.0000 mg | MEDICATED_PATCH | Freq: Every day | TRANSDERMAL | Status: DC
  Filled 2012-09-01: qty 1

## 2012-09-01 MED ORDER — VITAMIN B-1 100 MG PO TABS
100.0000 mg | ORAL_TABLET | Freq: Every day | ORAL | Status: DC
  Administered 2012-09-01: 100 mg via ORAL
  Filled 2012-09-01: qty 1

## 2012-09-01 MED ORDER — LORAZEPAM 1 MG PO TABS
1.0000 mg | ORAL_TABLET | Freq: Four times a day (QID) | ORAL | Status: DC | PRN
  Filled 2012-09-01 (×4): qty 1

## 2012-09-01 MED ORDER — IBUPROFEN 200 MG PO TABS
600.0000 mg | ORAL_TABLET | Freq: Three times a day (TID) | ORAL | Status: DC | PRN
  Filled 2012-09-01: qty 3

## 2012-09-01 MED ORDER — LORAZEPAM 1 MG PO TABS
0.0000 mg | ORAL_TABLET | Freq: Four times a day (QID) | ORAL | Status: DC
  Administered 2012-09-01 – 2012-09-02 (×5): 1 mg via ORAL
  Filled 2012-09-01: qty 1

## 2012-09-01 MED ORDER — LORAZEPAM 2 MG/ML IJ SOLN: 1.0000 mg | Freq: Four times a day (QID) | INTRAMUSCULAR | Status: DC | PRN

## 2012-09-01 MED ORDER — ALUM & MAG HYDROXIDE-SIMETH 200-200-20 MG/5ML PO SUSP: 30.0000 mL | ORAL | Status: DC | PRN

## 2012-09-01 MED ORDER — LORAZEPAM 1 MG PO TABS: 0.0000 mg | ORAL_TABLET | Freq: Two times a day (BID) | ORAL | Status: DC

## 2012-09-01 NOTE — BH Assessment (Signed)
Assessment Note   [name] is an 47 y.o. male.   Pt requesting alcohol detox after 2 years of daily drinking of 10 (40s) per day.  Pt denied SI, HI and AVH.  Pt denies being on medication.  Pt last detox was 2 yrs ago at [facility].    Pt eye contact was good, dishelved, Ox3, speech slurred but coherent, sleep decrease 4 hours  Recommend alcohol detox.  Info will be faxed to [facility] once pt BAL is under 200  Axis I: Alcohol Dep Axis II: Deferred Axis III: History reviewed. No pertinent past medical history. Axis IV: other psychosocial or environmental problems, problems related to legal system/crime, problems related to social environment and problems with primary support group Axis V: 51-60 moderate symptoms  Past Medical History: History reviewed. No pertinent past medical history.  Past Surgical History  Procedure Date  . Hemorrhoid surgery   . Mandible fracture surgery   . Back surgery     Family History: History reviewed. No pertinent family history.  Social History:  reports that he has been smoking.  He does not have any smokeless tobacco history on file. He reports that he drinks about 63 ounces of alcohol per week. He reports that he does not use illicit drugs.  Additional Social History:  Alcohol / Drug Use Pain Medications: na Prescriptions: na Over the Counter: na History of alcohol / drug use?: Yes Substance #1 Name of Substance 1: alcohol 1 - Age of First Use: teen 1 - Amount (size/oz): 10 40x per day 1 - Frequency: daily 1 - Duration: 2 years 1 - Last Use / Amount: 09-02-11  CIWA: CIWA-Ar BP: 109/59 mmHg Pulse Rate: 98  Nausea and Vomiting: no nausea and no vomiting Tactile Disturbances: none Tremor: no tremor Auditory Disturbances: not present Paroxysmal Sweats: no sweat visible Visual Disturbances: not present Anxiety: two Headache, Fullness in Head: very mild Agitation: normal activity Orientation and Clouding of Sensorium: oriented and can do  serial additions CIWA-Ar Total: 3  COWS:    Allergies: No Known Allergies  Home Medications:  (Not in a hospital admission)  OB/GYN Status:  No LMP for male patient.  General Assessment Data Location of Assessment: [facility] Living Arrangements: Other relatives Can pt return to current living arrangement?: Yes Admission Status: Voluntary Is patient capable of signing voluntary admission?: Yes Transfer from: Acute Hospital Referral Source: MD  Education Status Is patient currently in school?: No  Risk to self Suicidal Ideation: No Suicidal Intent: No Is patient at risk for suicide?: No Suicidal Plan?: No Access to Means: No What has been your use of drugs/alcohol within the last 12 months?: no Previous Attempts/Gestures: No How many times?: 0  Other Self Harm Risks: na Triggers for Past Attempts: None known Intentional Self Injurious Behavior: None Family Suicide History: No Recent stressful life event(s): Other (Comment) Persecutory voices/beliefs?: No Depression: No Substance abuse history and/or treatment for substance abuse?: No Suicide prevention information given to non-admitted patients: Not applicable  Risk to Others Homicidal Ideation: No Thoughts of Harm to Others: No Current Homicidal Intent: No Current Homicidal Plan: No Access to Homicidal Means: No Identified Victim: 0 History of harm to others?: No Assessment of Violence: None Noted Violent Behavior Description: cooperatives Does patient have access to weapons?: No Criminal Charges Pending?: No Does patient have a court date: No  Psychosis Hallucinations: None noted Delusions: None noted  Mental Status Report Appear/Hygiene: Disheveled Eye Contact: Fair Motor Activity: Unremarkable Speech: Logical/coherent Level of  Consciousness: Alert Mood: Worthless, low self-esteem Affect: Preoccupied Anxiety Level: Minimal Thought Processes: Relevant Judgement: Unimpaired Orientation:  Person;Time;Situation Obsessive Compulsive Thoughts/Behaviors: None  Cognitive Functioning Concentration: Decreased Memory: Remote Intact;Recent Intact IQ: Average Insight: Fair Impulse Control: Fair Appetite: Poor Weight Loss: 0  Weight Gain: 0  Sleep: Decreased Total Hours of Sleep: 4  Vegetative Symptoms: None  ADLScreening [facility]) Patient's cognitive ability adequate to safely complete daily activities?: Yes Patient able to express need for assistance with ADLs?: Yes Independently performs ADLs?: Yes (appropriate for developmental age)  Abuse/Neglect [facility]) Physical Abuse: Denies Verbal Abuse: Denies Sexual Abuse: Denies  Prior Inpatient Therapy Prior Inpatient Therapy: Yes Prior Therapy Dates: 2011 Prior Therapy Facilty/Provider(s): [facility] Reason for Treatment: detox  Prior Outpatient Therapy Prior Outpatient Therapy: No Prior Therapy Dates: na Prior Therapy Facilty/Provider(s): na Reason for Treatment: na  ADL Screening (condition at time of admission) Patient's cognitive ability adequate to safely complete daily activities?: Yes Patient able to express need for assistance with ADLs?: Yes Independently performs ADLs?: Yes (appropriate for developmental age) Weakness of Legs: None Weakness of Arms/Hands: None  Home Assistive Devices/Equipment Home Assistive Devices/Equipment: None  Therapy Consults (therapy consults require a physician order) PT Evaluation Needed: No OT Evalulation Needed: No SLP Evaluation Needed: No Abuse/Neglect Assessment (Assessment to be complete while patient is alone) Physical Abuse: Denies Verbal Abuse: Denies Sexual Abuse: Denies Exploitation of patient/patient's resources: Denies Self-Neglect: Denies Values / Beliefs Cultural Requests During Hospitalization: None Spiritual Requests During Hospitalization: None Consults Spiritual Care Consult Needed: No Social Work Consult Needed: No Merchant navy officer (For  Healthcare) Advance Directive: Patient does not have advance directive Pre-existing out of facility DNR order (yellow form or pink MOST form): No Nutrition Screen- [facility] Adult/[facility] Have you recently lost weight without trying?: No Have you been eating poorly because of a decreased appetite?: No Malnutrition Screening Tool Score: 0   Additional Information 1:1 In Past 12 Months?: No CIRT Risk: No Elopement Risk: No Does patient have medical clearance?: No     Disposition:   Alcohol detox recommended.  Pursue [facility]. Disposition Disposition of Patient: Inpatient treatment program Type of inpatient treatment program: Adult  On Site Evaluation by:   Reviewed with Physician:     Titus Mould, Eppie Gibson 09/01/2012 2:25 PM

## 2012-09-01 NOTE — ED Notes (Signed)
Pt here for detox from alcohol.  Pt states he drank 2 40 oz beers this am around 7.  Pt states he is tired of drinking and wants some help.

## 2012-09-01 NOTE — ED Notes (Signed)
Pt's O2 sat 87% to 91% on RA; O2 at 2L via [place] placed on patient; Pt's O2 sat 96% on 2L O2.

## 2012-09-01 NOTE — ED Provider Notes (Addendum)
History     CSN: [id number]  Arrival date & time 09/01/12  0843   First MD Initiated Contact with Patient 09/01/12 [phone]      No chief complaint on file.   (Consider location/radiation/quality/duration/timing/severity/associated sxs/prior treatment) Patient is a 47 y.o. male presenting with alcohol problem. The history is provided by the patient.  Alcohol Problem This is a chronic problem. Episode onset: years. The problem occurs constantly. The problem has not changed since onset.Pertinent negatives include no chest pain, no abdominal pain, no headaches and no shortness of breath. Nothing aggravates the symptoms. Nothing relieves the symptoms. He has tried nothing for the symptoms. The treatment provided no relief.    No past medical history on file.  Past Surgical History  Procedure Date  . Hemorrhoid surgery   . Mandible fracture surgery   . Back surgery     No family history on file.  History  Substance Use Topics  . Smoking status: Current Every Day Smoker -- 1.5 packs/day  . Smokeless tobacco: Not on file  . Alcohol Use: 63.0 oz/week    105 Cans of beer per week      Review of Systems  Respiratory: Negative for shortness of breath.   Cardiovascular: Negative for chest pain.  Gastrointestinal: Negative for abdominal pain.  Neurological: Negative for headaches.  All other systems reviewed and are negative.    Allergies  Review of patient's allergies indicates no known allergies.  Home Medications   Current Outpatient Rx  Name  Route  Sig  Dispense  Refill  . ASPIRIN-SALICYLAMIDE-CAFFEINE 650-195-33.3 MG PO PACK   Oral   Take 1 packet by mouth every 6 (six) hours as needed.         . IBUPROFEN 200 MG PO TABS   Oral   Take 800 mg by mouth every 6 (six) hours as needed. For pain           There were no vitals taken for this visit.  Physical Exam  Nursing note and vitals reviewed. Constitutional: He is oriented to person, place, and time. He  appears well-developed and well-nourished. No distress.  HENT:  Head: Normocephalic and atraumatic.  Mouth/Throat: Oropharynx is clear and moist.  Eyes: Conjunctivae normal and EOM are normal. Pupils are equal, round, and reactive to light.  Neck: Normal range of motion. Neck supple.  Cardiovascular: Normal rate, regular rhythm and intact distal pulses.   No murmur heard. Pulmonary/Chest: Effort normal and breath sounds normal. No respiratory distress. He has no wheezes. He has no rales.  Abdominal: Soft. He exhibits no distension. There is no tenderness. There is no rebound and no guarding.  Musculoskeletal: Normal range of motion. He exhibits no edema and no tenderness.  Neurological: He is alert and oriented to person, place, and time.  Skin: Skin is warm and dry. No rash noted. No erythema.  Psychiatric: He has a normal mood and affect. His behavior is normal.    ED Course  Procedures (including critical care time)   Labs Reviewed  ACETAMINOPHEN LEVEL  CBC  COMPREHENSIVE METABOLIC PANEL  ETHANOL  SALICYLATE LEVEL  URINE RAPID DRUG SCREEN (HOSP PERFORMED)   No results found.   1. Alcohol abuse       MDM   Patient is here for alcohol detox. He denies any medical problems and does not take medications regularly. He states he drank 240 ounce beers this morning because he was shaky. He denies history of seizures from alcohol withdrawal but states he  does get very shaky. Last time he attempted to stop drinking was approximately 2 or 3 years ago. States he drinks 7 to 840s daily. Currently on exam he is medically cleared. Medical clearance labs sent in patient sent to the psych ED.  Pt placed on CIWA       Gwyneth Sprout, MD 09/01/12 0930  Gwyneth Sprout, MD 09/01/12 [phone]

## 2012-09-01 NOTE — ED Notes (Signed)
Secretary to order breakfast tray for pt.

## 2012-09-01 NOTE — ED Notes (Addendum)
Pt calm and cooperative, denies SI, HI.  Pt's belongings locked up in # 31  Locker.

## 2012-09-01 NOTE — Progress Notes (Signed)
Spoke to [facility] at [place] and there are no beds available at present time.  ACT can contact [facility] after shift change 2000 or in the AM to pursue placement.  Pt did not want to go to [facility] as first choice but is cooperative and wants placement.  Pt wants [facility] and last placement there was 2 yrs ago.  Pt reported [facility] helped him.

## 2012-09-02 NOTE — BHH Counselor (Signed)
Faxed referral to Pat at [facility]. Spoke with pt who says he has a court date am of 09/03/12. Therefore he won't be accepted at [facility] as they don't take patients off campus to attend court during detox. He can start admission process again after court date is he is still interested according to [facility].

## 2012-09-02 NOTE — ED Notes (Signed)
Patient is resting comfortably. 

## 2012-09-02 NOTE — ED Notes (Signed)
Pt discharged home. Denies SI/HI. States has court date tomorrow.

## 2012-09-02 NOTE — ED Notes (Addendum)
Reviewed instructions for discharge.  Pt tremulous with e signature. Reporting mild withdrawal symptoms.

## 2012-09-02 NOTE — BH Assessment (Signed)
[facility] Assessment Progress Note   1/13 0437 Referral faxed to [facility] as they now have beds. Pt has court date am 09/03/12 so can't go to [facility]. Conctacted [facility] and per Larita Fife they will also not be able to accepted patient due to his court date. Writer met with patient to inform him that [facility] and [facility] will not be able to assist him at this time due to his legal issues. Patient asked to go home and stated that he would follow up with his court date tomorrow and seek treatment at another time. Writer provide patient with several community referrals that would be able to assist him with substance abuse treatment.

## 2012-09-10 ENCOUNTER — Emergency Department (HOSPITAL_COMMUNITY)
Admission: EM | Admit: 2012-09-10 | Discharge: 2012-09-11 | Disposition: A | Discharge status: Home / Self Care | Payer: Self-pay | Attending: Emergency Medicine | Admitting: Emergency Medicine

## 2012-09-10 ENCOUNTER — Encounter (HOSPITAL_COMMUNITY): Payer: Self-pay | Admitting: Emergency Medicine

## 2012-09-10 ENCOUNTER — Emergency Department (HOSPITAL_COMMUNITY): Payer: Self-pay

## 2012-09-10 DIAGNOSIS — F17200 Nicotine dependence, unspecified, uncomplicated: Secondary | ICD-10-CM | POA: Insufficient documentation

## 2012-09-10 DIAGNOSIS — F1010 Alcohol abuse, uncomplicated: Secondary | ICD-10-CM

## 2012-09-10 DIAGNOSIS — J3489 Other specified disorders of nose and nasal sinuses: Secondary | ICD-10-CM | POA: Insufficient documentation

## 2012-09-10 DIAGNOSIS — R0602 Shortness of breath: Secondary | ICD-10-CM | POA: Insufficient documentation

## 2012-09-10 DIAGNOSIS — R05 Cough: Secondary | ICD-10-CM | POA: Insufficient documentation

## 2012-09-10 DIAGNOSIS — F10929 Alcohol use, unspecified with intoxication, unspecified: Secondary | ICD-10-CM

## 2012-09-10 DIAGNOSIS — I10 Essential (primary) hypertension: Secondary | ICD-10-CM | POA: Insufficient documentation

## 2012-09-10 DIAGNOSIS — R5383 Other fatigue: Secondary | ICD-10-CM | POA: Insufficient documentation

## 2012-09-10 DIAGNOSIS — Z7982 Long term (current) use of aspirin: Secondary | ICD-10-CM | POA: Insufficient documentation

## 2012-09-10 DIAGNOSIS — R509 Fever, unspecified: Secondary | ICD-10-CM | POA: Insufficient documentation

## 2012-09-10 DIAGNOSIS — R630 Anorexia: Secondary | ICD-10-CM | POA: Insufficient documentation

## 2012-09-10 DIAGNOSIS — J189 Pneumonia, unspecified organism: Secondary | ICD-10-CM

## 2012-09-10 DIAGNOSIS — R059 Cough, unspecified: Secondary | ICD-10-CM | POA: Insufficient documentation

## 2012-09-10 DIAGNOSIS — R5381 Other malaise: Secondary | ICD-10-CM | POA: Insufficient documentation

## 2012-09-10 HISTORY — DX: Essential (primary) hypertension: I10

## 2012-09-10 LAB — CBC WITH DIFFERENTIAL/PLATELET
Basophils Relative: 2 % — ABNORMAL HIGH (ref 0–1)
HCT: 36 % — ABNORMAL LOW (ref 39.0–52.0)
Hemoglobin: 13 g/dL (ref 13.0–17.0)
Lymphocytes Relative: 38 % (ref 12–46)
Lymphs Abs: 2 10*3/uL (ref 0.7–4.0)
MCHC: 36.1 g/dL — ABNORMAL HIGH (ref 30.0–36.0)
Monocytes Absolute: 0.7 10*3/uL (ref 0.1–1.0)
Monocytes Relative: 13 % — ABNORMAL HIGH (ref 3–12)
Neutro Abs: 2.3 10*3/uL (ref 1.7–7.7)
Neutrophils Relative %: 44 % (ref 43–77)
RBC: 3.64 MIL/uL — ABNORMAL LOW (ref 4.22–5.81)
WBC: 5.2 10*3/uL (ref 4.0–10.5)

## 2012-09-10 LAB — RAPID URINE DRUG SCREEN, HOSP PERFORMED
Amphetamines: NOT DETECTED
Barbiturates: NOT DETECTED
Benzodiazepines: NOT DETECTED
Cocaine: NOT DETECTED

## 2012-09-10 LAB — BASIC METABOLIC PANEL
BUN: 3 mg/dL — ABNORMAL LOW (ref 6–23)
CO2: 22 mEq/L (ref 19–32)
Chloride: 94 mEq/L — ABNORMAL LOW (ref 96–112)
Creatinine, Ser: 0.52 mg/dL (ref 0.50–1.35)
GFR calc Af Amer: >90 mL/min (ref >90)
Potassium: 3.2 mEq/L — ABNORMAL LOW (ref 3.5–5.1)

## 2012-09-10 MED ORDER — CEFTRIAXONE SODIUM 1 G IJ SOLR
1.0000 g | Freq: Once | INTRAMUSCULAR | Status: AC
  Administered 2012-09-10: 1 g via INTRAMUSCULAR
  Filled 2012-09-10: qty 10

## 2012-09-10 MED ORDER — AZITHROMYCIN 250 MG PO TABS
500.0000 mg | ORAL_TABLET | Freq: Once | ORAL | Status: AC
  Administered 2012-09-10: 500 mg via ORAL
  Filled 2012-09-10: qty 2

## 2012-09-10 MED ORDER — BENZONATATE 100 MG PO CAPS
100.0000 mg | ORAL_CAPSULE | Freq: Once | ORAL | Status: AC
  Administered 2012-09-10: 100 mg via ORAL
  Filled 2012-09-10: qty 1

## 2012-09-10 NOTE — ED Provider Notes (Signed)
History     CSN: [id number]  Arrival date & time 09/10/12  2125   First MD Initiated Contact with Patient 09/10/12 2206      Chief Complaint  Patient presents with  . Generalized Body Aches  . Cough  . Chills   HPI  History provided by the patient. Patient is a 47 year old male with history of hypertension and alcohol abuse who presents with complaints of cough, fatigue and body aches. Patient states symptoms have been present for the past 3 weeks. Cough has been productive of yellow sputum. Denies hemoptysis. Symptoms have also been associated with some congestion. He denies any body aches. Does have decreased appetite. Denies diarrhea. Patient has been trying over-the-counter cough and cold medicines without relief. Denies any known sick contacts. Patient also states that he was planning to go to our cuff 1-2 weeks ago but did not end up following through. He is also requesting to go to our cuff this time for help with alcohol detox. Denies any depression, denies any SI or HI. Last drink was earlier today prior to arrival.    Past Medical History  Diagnosis Date  . Hypertension     Past Surgical History  Procedure Date  . Hemorrhoid surgery   . Mandible fracture surgery   . Back surgery   . Hernia repair     No family history on file.  History  Substance Use Topics  . Smoking status: Current Every Day Smoker -- 1.5 packs/day  . Smokeless tobacco: Not on file  . Alcohol Use: 63.0 oz/week    105 Cans of beer per week      Review of Systems  Constitutional: Positive for fever and chills. Negative for appetite change.  HENT: Positive for congestion and rhinorrhea. Negative for sore throat.   Respiratory: Positive for cough and shortness of breath. Negative for wheezing.   Cardiovascular: Negative for chest pain and palpitations.  Gastrointestinal: Negative for nausea, vomiting, abdominal pain and diarrhea.  Musculoskeletal: Negative for myalgias.  All other systems  reviewed and are negative.    Allergies  Review of patient's allergies indicates no known allergies.  Home Medications   Current Outpatient Rx  Name  Route  Sig  Dispense  Refill  . ASPIRIN-SALICYLAMIDE-CAFFEINE 650-195-33.3 MG PO PACK   Oral   Take 1-2 packets by mouth every 6 (six) hours as needed.          . GUAIFENESIN 100 MG/5ML PO SYRP   Oral   Take 200 mg by mouth 3 (three) times daily as needed. For cough           BP 123/80  Pulse 91  Temp 98.5 F (36.9 C) (Oral)  Resp 23  SpO2 98%  Physical Exam  Nursing note and vitals reviewed. Constitutional: He is oriented to person, place, and time. He appears well-developed and well-nourished. No distress.  HENT:  Head: Normocephalic.  Mouth/Throat: Oropharynx is clear and moist.  Neck: Normal range of motion. Neck supple.       No meningeal sign  Cardiovascular: Normal rate and regular rhythm.   No murmur heard. Pulmonary/Chest: Effort normal and breath sounds normal. No respiratory distress. He has no wheezes. He has no rales.  Abdominal: Soft. There is no tenderness. There is no rebound and no guarding.  Neurological: He is alert and oriented to person, place, and time.  Skin: Skin is warm.  Psychiatric: He has a normal mood and affect. His behavior is normal.    ED  Course  Procedures   Results for orders placed during the hospital encounter of 09/10/12  CBC WITH DIFFERENTIAL      Component Value Range   WBC 5.2  4.0 - 10.5 K/uL   RBC 3.64 (*) 4.22 - 5.81 MIL/uL   Hemoglobin 13.0  13.0 - 17.0 g/dL   HCT 16.1 (*) 09.6 - 04.5 %   MCV 98.9  78.0 - 100.0 fL   MCH 35.7 (*) 26.0 - 34.0 pg   MCHC 36.1 (*) 30.0 - 36.0 g/dL   RDW 40.9  81.1 - 91.4 %   Platelets 203  150 - 400 K/uL   Neutrophils Relative 44  43 - 77 %   Neutro Abs 2.3  1.7 - 7.7 K/uL   Lymphocytes Relative 38  12 - 46 %   Lymphs Abs 2.0  0.7 - 4.0 K/uL   Monocytes Relative 13 (*) 3 - 12 %   Monocytes Absolute 0.7  0.1 - 1.0 K/uL    Eosinophils Relative 4  0 - 5 %   Eosinophils Absolute 0.2  0.0 - 0.7 K/uL   Basophils Relative 2 (*) 0 - 1 %   Basophils Absolute 0.1  0.0 - 0.1 K/uL  BASIC METABOLIC PANEL      Component Value Range   Sodium 132 (*) 135 - 145 mEq/L   Potassium 3.2 (*) 3.5 - 5.1 mEq/L   Chloride 94 (*) 96 - 112 mEq/L   CO2 22  19 - 32 mEq/L   Glucose, Bld 87  70 - 99 mg/dL   BUN 3 (*) 6 - 23 mg/dL   Creatinine, Ser 7.82  0.50 - 1.35 mg/dL   Calcium 9.2  8.4 - 95.6 mg/dL   GFR calc non Af Amer >90  >90 mL/min   GFR calc Af Amer >90  >90 mL/min  ETHANOL      Component Value Range   Alcohol, Ethyl (B) 282 (*) 0 - 11 mg/dL  URINE RAPID DRUG SCREEN (HOSP PERFORMED)      Component Value Range   Opiates NONE DETECTED  NONE DETECTED   Cocaine NONE DETECTED  NONE DETECTED   Benzodiazepines NONE DETECTED  NONE DETECTED   Amphetamines NONE DETECTED  NONE DETECTED   Tetrahydrocannabinol NONE DETECTED  NONE DETECTED   Barbiturates NONE DETECTED  NONE DETECTED       Dg Chest 2 View  09/10/2012  *RADIOLOGY REPORT*  Clinical Data: Cough.  CHEST - 2 VIEW  Comparison: Chest radiograph performed 07/21/2010  Findings: The lungs are well-aerated.  Mild left basilar opacity may reflect atelectasis or possibly pneumonia.  There is no evidence of pleural effusion or pneumothorax.  The heart is normal in size; the mediastinal contour is within normal limits.  No acute osseous abnormalities are seen.  IMPRESSION: Mild left basilar airspace opacity may reflect atelectasis or possibly mild pneumonia.   Original Report Authenticated By: Tonia Ghent, M.D.      1. CAP (community acquired pneumonia)   2. Alcohol abuse   3. Alcohol intoxication       MDM  10:20 PM patient seen and evaluated. Patient with coughing but otherwise appears well resting complaint bed.   X-ray with signs of left basilar opacity. Will treat for possible pneumonia.   spoke with Aurther Loft with [facility] act team.  She will see patient and evaluate  for possible placement for alcohol detox. Patient will be moved to psych ED when available. Psych holding orders and alcohol detox protocol in place.  Angus Seller, [place] 09/11/12 [phone]

## 2012-09-10 NOTE — ED Notes (Signed)
Bed:WA19<BR> Expected date:<BR> Expected time:<BR> Means of arrival:<BR> Comments:<BR> EMS

## 2012-09-10 NOTE — ED Notes (Signed)
Per EMS: Pt c/o flu like symptoms for three weeks that include generalized body aches and cough. Pt has tried OTC cough medicine that did not relieve symptoms.

## 2012-09-11 MED ORDER — LORAZEPAM 1 MG PO TABS: 0.0000 mg | ORAL_TABLET | Freq: Two times a day (BID) | ORAL | Status: DC

## 2012-09-11 MED ORDER — VITAMIN B-1 100 MG PO TABS: 100.0000 mg | ORAL_TABLET | Freq: Every day | ORAL | Status: DC

## 2012-09-11 MED ORDER — IBUPROFEN 200 MG PO TABS
600.0000 mg | ORAL_TABLET | Freq: Three times a day (TID) | ORAL | Status: DC | PRN
  Administered 2012-09-11: 600 mg via ORAL
  Filled 2012-09-11: qty 3

## 2012-09-11 MED ORDER — ADULT MULTIVITAMIN W/MINERALS CH: 1.0000 | ORAL_TABLET | Freq: Every day | ORAL | Status: DC

## 2012-09-11 MED ORDER — AZITHROMYCIN 250 MG PO TABS: 250.0000 mg | ORAL_TABLET | Freq: Every day | ORAL | Status: DC

## 2012-09-11 MED ORDER — ALBUTEROL SULFATE HFA 108 (90 BASE) MCG/ACT IN AERS
2.0000 | INHALATION_SPRAY | RESPIRATORY_TRACT | Status: DC | PRN
  Administered 2012-09-11: 2 via RESPIRATORY_TRACT
  Filled 2012-09-11: qty 6.7

## 2012-09-11 MED ORDER — LEVOFLOXACIN 500 MG PO TABS
500.0000 mg | ORAL_TABLET | Freq: Once | ORAL | Status: AC
Start: 2012-09-11 — End: 2012-09-11
  Administered 2012-09-11: 500 mg via ORAL
  Filled 2012-09-11: qty 1

## 2012-09-11 MED ORDER — HYDROCOD POLST-CHLORPHEN POLST 10-8 MG/5ML PO LQCR
5.0000 mL | Freq: Once | ORAL | Status: AC
  Administered 2012-09-11: 5 mL via ORAL
  Filled 2012-09-11: qty 5

## 2012-09-11 MED ORDER — LORAZEPAM 1 MG PO TABS: 0.0000 mg | ORAL_TABLET | Freq: Four times a day (QID) | ORAL | Status: DC

## 2012-09-11 MED ORDER — ONDANSETRON HCL 4 MG PO TABS: 4.0000 mg | ORAL_TABLET | Freq: Three times a day (TID) | ORAL | Status: DC | PRN

## 2012-09-11 MED ORDER — FOLIC ACID 1 MG PO TABS: 1.0000 mg | ORAL_TABLET | Freq: Every day | ORAL | Status: DC

## 2012-09-11 MED ORDER — LORAZEPAM 2 MG/ML IJ SOLN: 1.0000 mg | Freq: Four times a day (QID) | INTRAMUSCULAR | Status: DC | PRN

## 2012-09-11 MED ORDER — LEVOFLOXACIN 500 MG PO TABS: 500.0000 mg | ORAL_TABLET | Freq: Every day | ORAL | Status: DC

## 2012-09-11 MED ORDER — ZOLPIDEM TARTRATE 5 MG PO TABS: 5.0000 mg | ORAL_TABLET | Freq: Every evening | ORAL | Status: DC | PRN

## 2012-09-11 MED ORDER — THIAMINE HCL 100 MG/ML IJ SOLN: 100.0000 mg | Freq: Every day | INTRAMUSCULAR | Status: DC

## 2012-09-11 MED ORDER — ALUM & MAG HYDROXIDE-SIMETH 200-200-20 MG/5ML PO SUSP: 30.0000 mL | ORAL | Status: DC | PRN

## 2012-09-11 MED ORDER — LORAZEPAM 1 MG PO TABS: 1.0000 mg | ORAL_TABLET | Freq: Four times a day (QID) | ORAL | Status: DC | PRN

## 2012-09-11 MED ORDER — NICOTINE 21 MG/24HR TD PT24: 21.0000 mg | MEDICATED_PATCH | Freq: Every day | TRANSDERMAL | Status: DC

## 2012-09-11 NOTE — ED Provider Notes (Signed)
8:25 AM Filed Vitals:   09/11/12 0818  BP: 130/89  Pulse: 98  Temp: 99.2 F (37.3 C)  Resp:     Dg Chest 2 View  09/10/2012  *RADIOLOGY REPORT*  Clinical Data: Cough.  CHEST - 2 VIEW  Comparison: Chest radiograph performed 07/21/2010  Findings: The lungs are well-aerated.  Mild left basilar opacity may reflect atelectasis or possibly pneumonia.  There is no evidence of pleural effusion or pneumothorax.  The heart is normal in size; the mediastinal contour is within normal limits.  No acute osseous abnormalities are seen.  IMPRESSION: Mild left basilar airspace opacity may reflect atelectasis or possibly mild pneumonia.   Original Report Authenticated By: Tonia Ghent, M.D.    No SI or HI. Outpatient resources for ETOH. Questionable PNA on cxr. Home with abx and albuterol inhaler for cough. Vitals normal this AM. No increased work of breathing. Pulse ox adequate  Lyanne Co, MD 09/11/12 0830

## 2012-09-12 NOTE — ED Provider Notes (Signed)
Medical screening examination/treatment/procedure(s) were performed by non-physician practitioner and as supervising physician I was immediately available for consultation/collaboration.  Vontae T Wannetta Langland, MD 09/12/12 1328 

## 2012-09-23 ENCOUNTER — Inpatient Hospital Stay (HOSPITAL_COMMUNITY)
Admission: EM | Admit: 2012-09-23 | Discharge: 2012-09-26 | DRG: 897 | Disposition: A | Discharge status: Home / Self Care | Payer: Federal, State, Local not specified - Other | Source: Intra-hospital | Attending: Psychiatry | Admitting: Psychiatry

## 2012-09-23 ENCOUNTER — Encounter (HOSPITAL_COMMUNITY): Payer: Self-pay | Admitting: *Deleted

## 2012-09-23 ENCOUNTER — Encounter (HOSPITAL_COMMUNITY): Payer: Self-pay | Admitting: Emergency Medicine

## 2012-09-23 ENCOUNTER — Emergency Department (HOSPITAL_COMMUNITY): Payer: Self-pay

## 2012-09-23 ENCOUNTER — Emergency Department (HOSPITAL_COMMUNITY)
Admission: EM | Admit: 2012-09-23 | Discharge: 2012-09-23 | Disposition: A | Discharge status: Other Acute Inpatient Hospital | Payer: Self-pay | Attending: Emergency Medicine | Admitting: Emergency Medicine

## 2012-09-23 DIAGNOSIS — I10 Essential (primary) hypertension: Secondary | ICD-10-CM | POA: Insufficient documentation

## 2012-09-23 DIAGNOSIS — F10939 Alcohol use, unspecified with withdrawal, unspecified: Secondary | ICD-10-CM | POA: Diagnosis present

## 2012-09-23 DIAGNOSIS — J449 Chronic obstructive pulmonary disease, unspecified: Secondary | ICD-10-CM | POA: Diagnosis present

## 2012-09-23 DIAGNOSIS — H519 Unspecified disorder of binocular movement: Secondary | ICD-10-CM | POA: Insufficient documentation

## 2012-09-23 DIAGNOSIS — F1010 Alcohol abuse, uncomplicated: Secondary | ICD-10-CM | POA: Diagnosis present

## 2012-09-23 DIAGNOSIS — F1020 Alcohol dependence, uncomplicated: Principal | ICD-10-CM | POA: Diagnosis present

## 2012-09-23 DIAGNOSIS — J4489 Other specified chronic obstructive pulmonary disease: Secondary | ICD-10-CM | POA: Diagnosis present

## 2012-09-23 DIAGNOSIS — R05 Cough: Secondary | ICD-10-CM | POA: Insufficient documentation

## 2012-09-23 DIAGNOSIS — F17200 Nicotine dependence, unspecified, uncomplicated: Secondary | ICD-10-CM | POA: Insufficient documentation

## 2012-09-23 DIAGNOSIS — R4789 Other speech disturbances: Secondary | ICD-10-CM | POA: Insufficient documentation

## 2012-09-23 DIAGNOSIS — R0602 Shortness of breath: Secondary | ICD-10-CM | POA: Insufficient documentation

## 2012-09-23 DIAGNOSIS — F10239 Alcohol dependence with withdrawal, unspecified: Secondary | ICD-10-CM | POA: Diagnosis present

## 2012-09-23 DIAGNOSIS — R059 Cough, unspecified: Secondary | ICD-10-CM | POA: Insufficient documentation

## 2012-09-23 HISTORY — DX: Depression, unspecified: F32.A

## 2012-09-23 HISTORY — DX: Chronic obstructive pulmonary disease, unspecified: J44.9

## 2012-09-23 HISTORY — DX: Major depressive disorder, single episode, unspecified: F32.9

## 2012-09-23 HISTORY — DX: Shortness of breath: R06.02

## 2012-09-23 LAB — CBC
HCT: 37.5 % — ABNORMAL LOW (ref 39.0–52.0)
Platelets: 368 10*3/uL (ref 150–400)
RBC: 3.85 MIL/uL — ABNORMAL LOW (ref 4.22–5.81)
RDW: 13.3 % (ref 11.5–15.5)
WBC: 4.2 10*3/uL (ref 4.0–10.5)

## 2012-09-23 LAB — ACETAMINOPHEN LEVEL: Acetaminophen (Tylenol), Serum: <15 ug/mL (ref 10–30)

## 2012-09-23 LAB — COMPREHENSIVE METABOLIC PANEL
AST: 63 U/L — ABNORMAL HIGH (ref 0–37)
Albumin: 4 g/dL (ref 3.5–5.2)
Alkaline Phosphatase: 63 U/L (ref 39–117)
BUN: 4 mg/dL — ABNORMAL LOW (ref 6–23)
Chloride: 101 mEq/L (ref 96–112)
Potassium: 3.7 mEq/L (ref 3.5–5.1)
Total Bilirubin: 0.1 mg/dL — ABNORMAL LOW (ref 0.3–1.2)

## 2012-09-23 MED ORDER — THIAMINE HCL 100 MG/ML IJ SOLN: 100.0000 mg | Freq: Once | INTRAMUSCULAR | Status: DC

## 2012-09-23 MED ORDER — MAGNESIUM HYDROXIDE 400 MG/5ML PO SUSP: 30.0000 mL | Freq: Every day | ORAL | Status: DC | PRN

## 2012-09-23 MED ORDER — IBUPROFEN 200 MG PO TABS: 600.0000 mg | ORAL_TABLET | Freq: Three times a day (TID) | ORAL | Status: DC | PRN

## 2012-09-23 MED ORDER — ACETAMINOPHEN 325 MG PO TABS: 650.0000 mg | ORAL_TABLET | ORAL | Status: DC | PRN

## 2012-09-23 MED ORDER — CHLORDIAZEPOXIDE HCL 25 MG PO CAPS: 25.0000 mg | ORAL_CAPSULE | Freq: Every day | ORAL | Status: DC

## 2012-09-23 MED ORDER — ONDANSETRON 4 MG PO TBDP: 4.0000 mg | ORAL_TABLET | Freq: Four times a day (QID) | ORAL | Status: DC | PRN

## 2012-09-23 MED ORDER — LOPERAMIDE HCL 2 MG PO CAPS: 2.0000 mg | ORAL_CAPSULE | ORAL | Status: DC | PRN

## 2012-09-23 MED ORDER — FOLIC ACID 1 MG PO TABS
1.0000 mg | ORAL_TABLET | Freq: Every day | ORAL | Status: DC
  Administered 2012-09-23: 1 mg via ORAL
  Filled 2012-09-23: qty 1

## 2012-09-23 MED ORDER — CHLORDIAZEPOXIDE HCL 25 MG PO CAPS
25.0000 mg | ORAL_CAPSULE | Freq: Four times a day (QID) | ORAL | Status: AC
  Administered 2012-09-24 – 2012-09-25 (×5): 25 mg via ORAL
  Filled 2012-09-23 (×4): qty 1

## 2012-09-23 MED ORDER — LORAZEPAM 1 MG PO TABS: 1.0000 mg | ORAL_TABLET | Freq: Four times a day (QID) | ORAL | Status: DC | PRN

## 2012-09-23 MED ORDER — LORAZEPAM 2 MG/ML IJ SOLN: 1.0000 mg | Freq: Four times a day (QID) | INTRAMUSCULAR | Status: DC | PRN

## 2012-09-23 MED ORDER — CHLORDIAZEPOXIDE HCL 25 MG PO CAPS
50.0000 mg | ORAL_CAPSULE | Freq: Once | ORAL | Status: AC
  Administered 2012-09-23: 50 mg via ORAL
  Filled 2012-09-23: qty 2

## 2012-09-23 MED ORDER — NICOTINE 21 MG/24HR TD PT24
21.0000 mg | MEDICATED_PATCH | Freq: Every day | TRANSDERMAL | Status: DC
  Filled 2012-09-23 (×4): qty 1

## 2012-09-23 MED ORDER — NICOTINE 21 MG/24HR TD PT24
21.0000 mg | MEDICATED_PATCH | Freq: Every day | TRANSDERMAL | Status: DC
  Filled 2012-09-23: qty 1

## 2012-09-23 MED ORDER — VITAMIN B-1 100 MG PO TABS
100.0000 mg | ORAL_TABLET | Freq: Every day | ORAL | Status: DC
  Administered 2012-09-24 – 2012-09-26 (×3): 100 mg via ORAL
  Filled 2012-09-23 (×4): qty 1

## 2012-09-23 MED ORDER — ALUM & MAG HYDROXIDE-SIMETH 200-200-20 MG/5ML PO SUSP: 30.0000 mL | ORAL | Status: DC | PRN

## 2012-09-23 MED ORDER — CHLORDIAZEPOXIDE HCL 25 MG PO CAPS: 25.0000 mg | ORAL_CAPSULE | ORAL | Status: DC

## 2012-09-23 MED ORDER — ACETAMINOPHEN 325 MG PO TABS: 650.0000 mg | ORAL_TABLET | Freq: Four times a day (QID) | ORAL | Status: DC | PRN

## 2012-09-23 MED ORDER — THIAMINE HCL 100 MG/ML IJ SOLN: 100.0000 mg | Freq: Every day | INTRAMUSCULAR | Status: DC

## 2012-09-23 MED ORDER — CHLORDIAZEPOXIDE HCL 25 MG PO CAPS
25.0000 mg | ORAL_CAPSULE | Freq: Three times a day (TID) | ORAL | Status: AC
  Administered 2012-09-25 – 2012-09-26 (×2): 25 mg via ORAL
  Filled 2012-09-23: qty 1

## 2012-09-23 MED ORDER — ADULT MULTIVITAMIN W/MINERALS CH
1.0000 | ORAL_TABLET | Freq: Every day | ORAL | Status: DC
  Administered 2012-09-23: 1 via ORAL
  Filled 2012-09-23: qty 1

## 2012-09-23 MED ORDER — ONDANSETRON HCL 4 MG PO TABS: 4.0000 mg | ORAL_TABLET | Freq: Three times a day (TID) | ORAL | Status: DC | PRN

## 2012-09-23 MED ORDER — ADULT MULTIVITAMIN W/MINERALS CH
1.0000 | ORAL_TABLET | Freq: Every day | ORAL | Status: DC
  Administered 2012-09-24 – 2012-09-26 (×3): 1 via ORAL
  Filled 2012-09-23 (×4): qty 1

## 2012-09-23 MED ORDER — VITAMIN B-1 100 MG PO TABS
100.0000 mg | ORAL_TABLET | Freq: Every day | ORAL | Status: DC
  Administered 2012-09-23: 100 mg via ORAL
  Filled 2012-09-23: qty 1

## 2012-09-23 MED ORDER — CHLORDIAZEPOXIDE HCL 25 MG PO CAPS
25.0000 mg | ORAL_CAPSULE | Freq: Four times a day (QID) | ORAL | Status: DC | PRN
  Filled 2012-09-23 (×3): qty 1

## 2012-09-23 MED ORDER — HYDROXYZINE HCL 25 MG PO TABS
25.0000 mg | ORAL_TABLET | Freq: Four times a day (QID) | ORAL | Status: DC | PRN
  Administered 2012-09-24: 25 mg via ORAL

## 2012-09-23 NOTE — ED Provider Notes (Signed)
History     CSN: [id number]  Arrival date & time 09/23/12  1001   First MD Initiated Contact with Patient 09/23/12 1101      Chief Complaint  Patient presents with  . Medical Clearance    (Consider location/radiation/quality/duration/timing/severity/associated sxs/prior treatment) Patient is a 47 y.o. male presenting with cough and intoxication. The history is provided by the patient. The history is limited by the condition of the patient (pt is intoxicated).  Cough This is a new problem. Episode onset: 1-2 weeks ago. The problem occurs constantly. Cough characteristics: unknown. Associated symptoms include shortness of breath. Pertinent negatives include no wheezing. He is a smoker. His past medical history does not include bronchitis or asthma. Past medical history comments: seen here a week ago and dx with possible CAP.  Alcohol Intoxication This is a chronic (pt is requesting detox) problem. Episode onset: states he can't remember when he started drinking but thinks it is 11pm when actually is 11am. The problem occurs constantly. The problem has been gradually worsening. Associated symptoms include shortness of breath. Associated symptoms comments: cough.    Past Medical History  Diagnosis Date  . Hypertension     Past Surgical History  Procedure Date  . Hemorrhoid surgery   . Mandible fracture surgery   . Back surgery   . Hernia repair     No family history on file.  History  Substance Use Topics  . Smoking status: Current Every Day Smoker -- 2.0 packs/day  . Smokeless tobacco: Not on file  . Alcohol Use: 6.0 oz/week    10 Cans of beer per week      Review of Systems  Unable to perform ROS Respiratory: Positive for cough and shortness of breath. Negative for wheezing.     Allergies  Review of patient's allergies indicates no known allergies.  Home Medications   Current Outpatient Rx  Name  Route  Sig  Dispense  Refill  . ASPIRIN-SALICYLAMIDE-CAFFEINE  [id number] MG PO PACK   Oral   Take 1-2 packets by mouth every 6 (six) hours as needed. For pain.         Marland Kitchen GUAIFENESIN 100 MG/5ML PO SYRP   Oral   Take 200 mg by mouth 3 (three) times daily as needed. For cough           BP 112/71  Pulse 97  Temp 97.5 F (36.4 C) (Oral)  Resp 18  SpO2 96%  Physical Exam  Nursing note and vitals reviewed. Constitutional: He is oriented to person, place, and time. He appears well-developed and well-nourished. No distress.       Intoxicated, hard to arouse and smells of alcohol  HENT:  Head: Normocephalic and atraumatic.  Mouth/Throat: Oropharynx is clear and moist.  Eyes: Conjunctivae normal and EOM are normal. Pupils are equal, round, and reactive to light.       Strabismus of the right eye  Neck: Normal range of motion. Neck supple.  Cardiovascular: Normal rate, regular rhythm and intact distal pulses.   No murmur heard. Pulmonary/Chest: Effort normal and breath sounds normal. No respiratory distress. He has no wheezes. He has no rales.  Abdominal: Soft. He exhibits no distension. There is no tenderness. There is no rebound and no guarding.  Musculoskeletal: Normal range of motion. He exhibits no edema and no tenderness.  Neurological: He is alert and oriented to person, place, and time.  Skin: Skin is warm and dry. No rash noted. No erythema.  Psychiatric: His speech is  slurred.       intoxicated    ED Course  Procedures (including critical care time)  Labs Reviewed  CBC - Abnormal; Notable for the following:    RBC 3.85 (*)     HCT 37.5 (*)     MCH 35.3 (*)     MCHC 36.3 (*)     All other components within normal limits  COMPREHENSIVE METABOLIC PANEL - Abnormal; Notable for the following:    BUN 4 (*)     AST 63 (*)     Total Bilirubin 0.1 (*)     All other components within normal limits  ETHANOL - Abnormal; Notable for the following:    Alcohol, Ethyl (B) 403 (*)     All other components within normal limits   SALICYLATE LEVEL - Abnormal; Notable for the following:    Salicylate Lvl <2.0 (*)     All other components within normal limits  ACETAMINOPHEN LEVEL  URINE RAPID DRUG SCREEN (HOSP PERFORMED)   No results found.   1. Alcohol abuse       MDM   Patient is here for alcohol detox. However he was drinking beer when he walked in the door. Patient was here last month and they were trying to get him into our care however because of some court cases he left. He states he now has that issue taking care of and does need help with his alcohol problem. Currently patient is too intoxicated to be evaluated by [facility].  Will check medical clearance labs and allow the patient to sober up before being seen. Secondly patient states he's had a persistent cough and was seen here approximately one to 2 weeks ago and diagnosed with a possible pneumonia. On exam he satting 96% on room air and is not tachypnea. He has no focal findings on exam other than his intoxication. We'll repeat chest x-ray.        Gwyneth Sprout, MD 09/23/12 1553

## 2012-09-23 NOTE — BH Assessment (Signed)
Assessment Note   [name] is an 47 y.o. male who presents to the ED requesting detox. CSW met with pt at bedside to complete assessment. Pt denies any currrent SI/HI/AH/VH. Patient reports that he drinks 8 to 9 40 oz beers daily and liquor every now and then but unsure of amount. Pt reports that he has been drinking since he was 16 quite heavily, and his last drink was today as he arrived into the hosptial. Pt reports that he is needing detox and and is needing help.   Pt states he currently lives with his girlfriends brother and can return once he completes detox. Pt reports his only supports are his girlfriend and her brother.  Pt reports that he used to use crack and marijuana however he stopped 6 to 7 months ago trying to get clean.    Pt denies any current symptoms of depression. Pt does report that 8 months ago he had suicidal ideations and he has attempted thought about committing suicide twice in the past. Patient denies any current or recent suicidal ideations.   Pt reports that he is wanting to complete detox and stay sober.   Pt denies any current charges or pending court dates.   Axis I: alcohol abuse  Axis II: Deferred Axis III:  Past Medical History  Diagnosis Date  . Hypertension    Axis IV: economic problems, other psychosocial or environmental problems and problems related to social environment Axis V: 51-60 moderate symptoms  Past Medical History:  Past Medical History  Diagnosis Date  . Hypertension     Past Surgical History  Procedure Date  . Hemorrhoid surgery   . Mandible fracture surgery   . Back surgery   . Hernia repair     Family History: No family history on file.  Social History:  reports that he has been smoking.  He does not have any smokeless tobacco history on file. He reports that he drinks about 6 ounces of alcohol per week. He reports that he uses illicit drugs (Cocaine).  Additional Social History:  Alcohol / Drug Use History of  alcohol / drug use?: Yes Substance #1 Name of Substance 1: alcohol  1 - Age of First Use: 16 1 - Amount (size/oz): 8-9 40 oz beers daily, liquor occasional 1 - Frequency: daily  1 - Duration: years, since he was 16 1 - Last Use / Amount: 09/23/2012  Substance #2 Name of Substance 2: cocaine  2 - Age of First Use: 19 2 - Amount (size/oz): unknown 2 - Frequency: unknown 2 - Duration: years on and off 2 - Last Use / Amount: 6 months ago unknown  Substance #3 Name of Substance 3: thc  3 - Age of First Use: 16 3 - Amount (size/oz): unknown 3 - Frequency: unknown 3 - Duration: years 3 - Last Use / Amount: 7 months ago   CIWA: CIWA-Ar BP: 104/64 mmHg Pulse Rate: 95  Nausea and Vomiting: no nausea and no vomiting Tactile Disturbances: none Tremor: no tremor Auditory Disturbances: not present Paroxysmal Sweats: no sweat visible Visual Disturbances: not present Anxiety: no anxiety, at ease Headache, Fullness in Head: none present Agitation: normal activity Orientation and Clouding of Sensorium: oriented and can do serial additions CIWA-Ar Total: 0  COWS:    Allergies: No Known Allergies  Home Medications:  (Not in a hospital admission)  OB/GYN Status:  No LMP for male patient.  General Assessment Data Location of Assessment: [facility] ED Living Arrangements: Other relatives  Can pt return to current living arrangement?: Yes Admission Status: Voluntary Is patient capable of signing voluntary admission?: Yes Transfer from: Home Referral Source: Self/Family/Friend  Education Status Is patient currently in school?: No Highest grade of school patient has completed: 12  Risk to self Suicidal Ideation: No Suicidal Intent: No Is patient at risk for suicide?: No Suicidal Plan?: No Access to Means: No What has been your use of drugs/alcohol within the last 12 months?: alcohol  Previous Attempts/Gestures: Yes How many times?: 2  Other Self Harm Risks: no Triggers for Past  Attempts: None known Intentional Self Injurious Behavior: None Family Suicide History: No Recent stressful life event(s): Financial Problems Depression: No Substance abuse history and/or treatment for substance abuse?: Yes  Risk to Others Homicidal Ideation: No Thoughts of Harm to Others: No Current Homicidal Intent: No Current Homicidal Plan: No Access to Homicidal Means: No Identified Victim: n/a History of harm to others?: No Assessment of Violence: None Noted Violent Behavior Description: none Does patient have access to weapons?: No Criminal Charges Pending?: No Does patient have a court date: No  Psychosis Hallucinations: None noted Delusions: None noted  Mental Status Report Appear/Hygiene: Disheveled Eye Contact: Poor Motor Activity: Unremarkable Speech: Logical/coherent Level of Consciousness: Alert Mood: Sad Affect: Appropriate to circumstance Anxiety Level: Minimal Thought Processes: Relevant Judgement: Unimpaired Orientation: Person;Place;Time;Situation Obsessive Compulsive Thoughts/Behaviors: None  Cognitive Functioning Concentration: Normal Memory: Recent Intact;Remote Intact IQ: Average Insight: Fair Impulse Control: Poor Appetite: Fair Sleep: No Change Total Hours of Sleep: 6  Vegetative Symptoms: None  ADLScreening [facility]) Patient's cognitive ability adequate to safely complete daily activities?: Yes Patient able to express need for assistance with ADLs?: Yes Independently performs ADLs?: Yes (appropriate for developmental age)  Abuse/Neglect [facility]) Physical Abuse: Denies Verbal Abuse: Denies Sexual Abuse: Denies  Prior Inpatient Therapy Prior Inpatient Therapy: Yes Prior Therapy Dates: 2011  Prior Therapy Facilty/Provider(s): [facility], [place]  Reason for Treatment: detox  Prior Outpatient Therapy Prior Outpatient Therapy: No Prior Therapy Dates: na Prior Therapy Facilty/Provider(s): na Reason for Treatment:  na  ADL Screening (condition at time of admission) Patient's cognitive ability adequate to safely complete daily activities?: Yes Patient able to express need for assistance with ADLs?: Yes Independently performs ADLs?: Yes (appropriate for developmental age)       Abuse/Neglect Assessment (Assessment to be complete while patient is alone) Physical Abuse: Denies Verbal Abuse: Denies Sexual Abuse: Denies          Additional Information 1:1 In Past 12 Months?: No CIRT Risk: No Elopement Risk: No Does patient have medical clearance?: Yes     Disposition:  Disposition Disposition of Patient: Inpatient treatment program Type of inpatient treatment program: Adult  On Site Evaluation by:   Reviewed with Physician:     Catha Gosselin A 09/23/2012 5:50 PM

## 2012-09-23 NOTE — Progress Notes (Addendum)
Pt referred to [facility] pending review.  Pt accepted to [facility], which pt preferred. EDP informed and RN. Pt accepted [id number].   Pt referred to [facility] pending review.   Pt referred to [facility] pending review.  Pt accepted pending alcohol level at 0.16 or 160, patient bal to be redrawn at approximately 8pm per RN.   CSW discussed with patient who agreed if nothing available closer. If patient discharges to [facility] transportation will be needed. CSW to follow up in morning to assist with transportation assistance with train and bus.   CSW informed [facility] that patient accepted bed at [facility].   Catha Gosselin, LCSWA  [phone] .09/23/2012 1809pm   Addendum

## 2012-09-23 NOTE — ED Notes (Signed)
Pt states "I need detox" Pt states his last dink was "3 minutes ago"

## 2012-09-24 ENCOUNTER — Encounter (HOSPITAL_COMMUNITY): Payer: Self-pay | Admitting: Psychiatry

## 2012-09-24 DIAGNOSIS — F1010 Alcohol abuse, uncomplicated: Secondary | ICD-10-CM

## 2012-09-24 DIAGNOSIS — F10939 Alcohol use, unspecified with withdrawal, unspecified: Secondary | ICD-10-CM | POA: Diagnosis present

## 2012-09-24 DIAGNOSIS — F1020 Alcohol dependence, uncomplicated: Secondary | ICD-10-CM | POA: Diagnosis present

## 2012-09-24 DIAGNOSIS — F10239 Alcohol dependence with withdrawal, unspecified: Secondary | ICD-10-CM | POA: Diagnosis present

## 2012-09-24 NOTE — Progress Notes (Signed)
[facility] Group Notes:  (Nursing/MHT/Case Management/Adjunct)  Type of Therapy:  Psychoeducational Skills  Participation Level:  Minimal  Participation Quality:  Attentive  Affect:  Blunted and Depressed  Cognitive:  Appropriate and Oriented  Insight:  Lacking  Engagement in Group:  Limited  Modes of Intervention:  Activity, Discussion, Education, Problem-solving, Rapport Building, Socialization and Support  Summary of Progress/Problems: [name] attended psychoeducational group on labels. 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 [name] was quiet but shared when prompted while group discussed what labels are, how we use them, how they affect the way we think about and perceive the world, and listed positive and negative labels they have used or been called. [name] was given homework assignment to list 10 words he has been labeled and to find the reality of the situation/label.    Wandra Scot 09/24/2012 1:16 PM

## 2012-09-24 NOTE — H&P (Signed)
Psychiatric Admission Assessment Adult  Patient Identification:  [name]  Date of Evaluation:  09/24/2012  Chief Complaint:  Alcohol Dependence  History of Present Illness: This is a 47 year old African-American male, admitted to [facility] from the [facility] ED with complaints of alcohol intoxication requesting detoxification treatment. Patient reports, "I was taken to the [facility] ED yesterday by my brother in-law because I was drunk. I'm an alcoholic. I have been drinking heavily since the age of 42. I need help to stop drinking alcohol. I need to be detoxified here, and then send me to [facility] for further substance abuse treatment because I abuse cocaine and pills too. I was at [facility] 2 years ago for my drinking problems. Going to [facility] at the that time helped me because I stayed sober x 10 months afterwards. I just could not maintain sobriety. I fall off the wagon too soon. That is why I need to go to treatment for a long time. I was at [facility] in [place] about 8 years ago also for my alcohol abuse. I can control myself when it pertains to alcohol. I enjoy the taste of alcohol. Besides, alcohol gives me the buzz as well. I like the buzz that I get from drinking alcohol. Alcohol has also caused me quite much. I have lost jobs, relationships and had received DUI charge in the past. I'm not depressed, suicidal and or homicidal".  Elements:  Location:  [facility] adult unit. Quality:  "Constant craving of alcohol, drinking 8-9 (40oz) daily". Severity:  "I can no longer control how much I drink on daily basis". Timing:  "I have been drinking heavily x 2 weeks". Duration:  "I started drinking at the age of 53 ". Context:  "I have lost jobs, relationships, money, DUI".  Associated Signs/Synptoms:  Depression Symptoms:  feelings of worthlessness/guilt, anxiety,  (Hypo) Manic Symptoms:  Denies hallucinations, delusional thoughts and or paranoia  Anxiety Symptoms:  Excessive  Worry,  Psychotic Symptoms:  Hallucinations: None  PTSD Symptoms: Denies  Psychiatric Specialty Exam: Physical Exam  Constitutional: He is oriented to person, place, and time. He appears well-developed.  HENT:  Head: Normocephalic.  Eyes: Pupils are equal, round, and reactive to light.  Neck: Normal range of motion.  Cardiovascular: Normal rate.   Respiratory: Effort normal.  GI: Soft.  Musculoskeletal: Normal range of motion.  Neurological: He is alert and oriented to person, place, and time.  Skin: Skin is warm and dry.  Psychiatric: Thought content normal. His mood appears anxious. His affect is not angry, not blunt, not labile and not inappropriate. His speech is slurred. He is slowed. Cognition and memory are normal. He expresses impulsivity. He does not exhibit a depressed mood.    Review of Systems  Constitutional: Negative.   HENT: Negative.   Eyes:       Patient appears to have lazy eye (left eye)  Respiratory: Negative.   Cardiovascular: Negative.   Gastrointestinal: Negative.   Genitourinary: Negative.   Musculoskeletal: Negative.   Skin: Negative.   Neurological: Positive for tremors.  Endo/Heme/Allergies: Negative.   Psychiatric/Behavioral: Positive for hallucinations ("I hear voices sometimes") and substance abuse. Negative for depression, suicidal ideas and memory loss. The patient is nervous/anxious. The patient does not have insomnia.     Blood pressure 125/85, pulse 108, temperature 98.3 F (36.8 C), temperature source Oral, resp. rate 18, height 5' 5.5" (1.664 m), weight 56.7 kg (125 lb).Body mass index is 20.48 kg/(m^2).  General Appearance: Disheveled  Eye Contact::  Fair  Speech:  Slow and Slurred at times.  Volume:  Decreased  Mood:  Anxious  Affect:  Restricted  Thought Process:  Coherent  Orientation:  Full (Time, Place, and Person)  Thought Content:  Rumination.  Suicidal Thoughts:  No  Homicidal Thoughts:  No  Memory:  Immediate;    Good Recent;   Fair Remote;   Fair  Judgement:  Impaired  Insight:  Shallow  Psychomotor Activity:  Tremor  Concentration:  Fair  Recall:  Fair  Akathisia:  No  Handed:  Right  AIMS (if indicated):     Assets:  Desire for Improvement  Sleep:  Number of Hours: 5.5     Past Psychiatric History: Diagnosis: Alcohol abuse  Hospitalizations: [facility]  Outpatient Care:  ago.Substance Abuse Care: [facility] 2 years ago, [facility] a long time ago.  Self-Mutilation: Denies  Suicidal Attempts: Denies attempt and or thoughts.  Violent Behaviors: Denies.   Past Medical History:   Past Medical History  Diagnosis Date  . Hypertension   . Shortness of breath   . COPD (chronic obstructive pulmonary disease)   . Depression     Allergies:  No Known Allergies  PTA Medications: Prescriptions prior to admission  Medication Sig Dispense Refill  . Aspirin-Salicylamide-Caffeine (BC FAST PAIN RELIEF) 650-195-33.3 MG PACK Take 1-2 packets by mouth every 6 (six) hours as needed. For pain.      Marland Kitchen guaifenesin (ROBITUSSIN) 100 MG/5ML syrup Take 200 mg by mouth 3 (three) times daily as needed. For cough        Previous Psychotropic Medications:  Medication/Dose  No current medication in use               Substance Abuse History in the last 12 months:  yes  Consequences of Substance Abuse: Medical Consequences:  Liver damage, Possible death by overdose Legal Consequences:  Arrests, jail time, Loss of driving privilege. Family Consequences:  Family discord, divorce and or separation.   Social History:  reports that he has been smoking Cigarettes.  He has been smoking about 2 packs per day. He does not have any smokeless tobacco history on file. He reports that he drinks about 6 ounces of alcohol per week. He reports that he uses illicit drugs (Cocaine and Marijuana). Additional Social History: Pain Medications: See home med list Prescriptions: See home med list Over the Counter: See home med  list History of alcohol / drug use?: Yes Longest period of sobriety (when/how long): 1 week Negative Consequences of Use: Financial;Personal relationships;Work / Programmer, multimedia Withdrawal Symptoms: Weakness Name of Substance 1: ETOH 1 - Age of First Use: 16 1 - Amount (size/oz): 8-9 40 oz beers 1 - Frequency: daily 1 - Duration: since age 69 1 - Last Use / Amount: 09/23/12 Name of Substance 2: cocaine 2 - Age of First Use: 19 2 - Amount (size/oz): varies 2 - Frequency: occasionally 2 - Duration: off and on for years 2 - Last Use / Amount: aboutt 6 mon ago Name of Substance 3: THC 3 - Age of First Use: 16 3 - Amount (size/oz): varies 3 - Frequency: occasionally 3 - Duration: for years 3 - Last Use / Amount: about 6 months ago  Current Place of Residence: [place], [place]    Place of Birth: [place], [place]  Family Members: "I have a girl-friend"  Marital Status:  Separated  Children: 0  Sons: 0  Daughters: 0  Relationships: "I have a girl-friend"  Education:  HS Graduate  Educational Problems/Performance: Completed high school.  Religious Beliefs/Practices: None reported  History of Abuse (Emotional/Phsycial/Sexual): Denies any hx of abuse.  Occupational Experiences: English as a second language teacher History:  None.  Legal History: Hx of DUI   Hobbies/Interests: None reported.  Family History:  History reviewed. No pertinent family history.  Results for orders placed during the hospital encounter of 09/23/12 (from the past 72 hour(s))  ACETAMINOPHEN LEVEL     Status: Normal   Collection Time   09/23/12 10:50 AM      Component Value Range Comment   Acetaminophen (Tylenol), Serum <15.0  10 - 30 ug/mL   CBC     Status: Abnormal   Collection Time   09/23/12 10:50 AM      Component Value Range Comment   WBC 4.2  4.0 - 10.5 K/uL    RBC 3.85 (*) 4.22 - 5.81 MIL/uL    Hemoglobin 13.6  13.0 - 17.0 g/dL    HCT 09.8 (*) 11.9 - 52.0 %    MCV 97.4  78.0 - 100.0 fL    MCH 35.3 (*) 26.0 -  34.0 pg    MCHC 36.3 (*) 30.0 - 36.0 g/dL    RDW 14.7  82.9 - 56.2 %    Platelets 368  150 - 400 K/uL   COMPREHENSIVE METABOLIC PANEL     Status: Abnormal   Collection Time   09/23/12 10:50 AM      Component Value Range Comment   Sodium 141  135 - 145 mEq/L    Potassium 3.7  3.5 - 5.1 mEq/L    Chloride 101  96 - 112 mEq/L    CO2 27  19 - 32 mEq/L    Glucose, Bld 91  70 - 99 mg/dL    BUN 4 (*) 6 - 23 mg/dL    Creatinine, Ser 1.30  0.50 - 1.35 mg/dL    Calcium 8.9  8.4 - 86.5 mg/dL    Total Protein 8.1  6.0 - 8.3 g/dL    Albumin 4.0  3.5 - 5.2 g/dL    AST 63 (*) 0 - 37 U/L    ALT 32  0 - 53 U/L    Alkaline Phosphatase 63  39 - 117 U/L    Total Bilirubin 0.1 (*) 0.3 - 1.2 mg/dL    GFR calc non Af Amer >90  >90 mL/min    GFR calc Af Amer >90  >90 mL/min   ETHANOL     Status: Abnormal   Collection Time   09/23/12 10:50 AM      Component Value Range Comment   Alcohol, Ethyl (B) 403 (*) 0 - 11 mg/dL   SALICYLATE LEVEL     Status: Abnormal   Collection Time   09/23/12 10:50 AM      Component Value Range Comment   Salicylate Lvl <2.0 (*) 2.8 - 20.0 mg/dL   ETHANOL     Status: Abnormal   Collection Time   09/23/12  8:00 PM      Component Value Range Comment   Alcohol, Ethyl (B) 132 (*) 0 - 11 mg/dL    Psychological Evaluations:  Assessment:   AXIS I:  Alcohol Abuse AXIS II:  Deferred AXIS III:   Past Medical History  Diagnosis Date  . Hypertension   . Shortness of breath   . COPD (chronic obstructive pulmonary disease)   . Depression    AXIS IV:  economic problems, occupational problems and other psychosocial or environmental problems AXIS V:  50  Treatment Plan/Recommendations: 1. Admit for crisis management and stabilization, estimated length of stay 3-5 days.  2. Medication management to reduce current symptoms to base line and improve the patient's overall level of functioning  3. Treat health problems as indicated.  4. Develop treatment plan to decrease risk of relapse  upon discharge and the need for readmission.  5. Psycho-social education regarding relapse prevention and self care.  6. Health care follow up as needed for medical problems.  7. Review, reconcile, and reinstate any pertinent home medications for other health issues where appropriate. 8. Call for consults with hospitalist for any additional specialty patient care services as needed.   Treatment Plan Summary: Daily contact with patient to assess and evaluate symptoms and progress in treatment Medication management  Current Medications:  Current Facility-Administered Medications  Medication Dose Route Frequency Provider Last Rate Last Dose  . acetaminophen (TYLENOL) tablet 650 mg  650 mg Oral Q6H PRN Verne Spurr, PA-C      . alum & mag hydroxide-simeth (MAALOX/MYLANTA) 200-200-20 MG/5ML suspension 30 mL  30 mL Oral Q4H PRN Verne Spurr, PA-C      . chlordiazePOXIDE (LIBRIUM) capsule 25 mg  25 mg Oral Q6H PRN Verne Spurr, PA-C      . chlordiazePOXIDE (LIBRIUM) capsule 25 mg  25 mg Oral QID Verne Spurr, PA-C   25 mg at 09/24/12 9562   Followed by  . chlordiazePOXIDE (LIBRIUM) capsule 25 mg  25 mg Oral TID Verne Spurr, PA-C       Followed by  . chlordiazePOXIDE (LIBRIUM) capsule 25 mg  25 mg Oral BH-qamhs Verne Spurr, PA-C       Followed by  . chlordiazePOXIDE (LIBRIUM) capsule 25 mg  25 mg Oral Daily Verne Spurr, PA-C      . hydrOXYzine (ATARAX/VISTARIL) tablet 25 mg  25 mg Oral Q6H PRN Verne Spurr, PA-C      . loperamide (IMODIUM) capsule 2-4 mg  2-4 mg Oral PRN Verne Spurr, PA-C      . magnesium hydroxide (MILK OF MAGNESIA) suspension 30 mL  30 mL Oral Daily PRN Verne Spurr, PA-C      . multivitamin with minerals tablet 1 tablet  1 tablet Oral Daily Verne Spurr, PA-C   1 tablet at 09/24/12 [phone]  . nicotine (NICODERM CQ - dosed in mg/24 hours) patch 21 mg  21 mg Transdermal Q0600 Verne Spurr, PA-C      . ondansetron (ZOFRAN-ODT) disintegrating tablet 4 mg  4 mg Oral  Q6H PRN Verne Spurr, PA-C      . thiamine (B-1) injection 100 mg  100 mg Intramuscular Once [employer], PA-C      . thiamine (VITAMIN B-1) tablet 100 mg  100 mg Oral Daily Verne Spurr, PA-C   100 mg at 09/24/12 6578    Observation Level/Precautions:  15 minute checks  Laboratory:  Reviewed ED lab findings on file.  Psychotherapy:  Group counseling sessions.  Medications:  See medication lists  Consultations:  None indicated at this time.  Discharge Concerns:  Maintaining sobriety  Estimated LOS: 3-5 days.  Other:     I certify that inpatient services furnished can reasonably be expected to improve the patient's condition.   Armandina Stammer I 2/4/20149:24 AM

## 2012-09-24 NOTE — Progress Notes (Signed)
Adult Psychoeducational Group Note  Date:  09/24/2012 Time:  2000  Group Topic/Focus:  The Sober Life: Dedicating Yourself to Sobriety  Participation Level:  Did Not Attend  Participation Quality:    Affect:    Cognitive:    Insight:   Engagement in Group:    Modes of Intervention:    Additional Comments:  Pt was in bed asleep during group.  Humberto Seals Monique 09/24/2012, 10:18 PM

## 2012-09-24 NOTE — Progress Notes (Signed)
Adult Psychoeducational Group Note  Date:  09/24/2012 Time:  2:04 PM  Group Topic/Focus:  Recovery Goals:   The focus of this group is to identify appropriate goals for recovery and establish a plan to achieve them.  Participation Level:  Minimal  Participation Quality:  Appropriate and Redirectable  Affect:  Appropriate and Flat  Cognitive:  Alert  Insight: Limited  Engagement in Group:  Limited  Modes of Intervention:  Discussion, Education, Limit-setting and Support  Additional Comments:  During ice breaker, pt reported that his favorite activity was drinking. Staff redirected pt to verbalize a more positive activity, so pt reported that he enjoys spending time with others. Pt made no other active contributions to group discussion, but participated by listening and filling out his worksheet.  Reinaldo Raddle K 09/24/2012, 2:04 PM

## 2012-09-24 NOTE — [facility] Suicide Risk Assessment (Signed)
Suicide Risk Assessment  Admission Assessment     Nursing information obtained from:  Patient Demographic factors:  Male;Low socioeconomic status;Unemployed Current Mental Status:  NA (Pt denies suicidal ideation) Loss Factors:  NA Historical Factors:  Prior suicide attempts;Family history of mental illness or substance abuse Risk Reduction Factors:  Living with another person, especially a relative;Positive social support  CLINICAL FACTORS:   Alcohol/Substance Abuse/Dependencies  COGNITIVE FEATURES THAT CONTRIBUTE TO RISK:  Closed-mindedness Thought constriction (tunnel vision)    SUICIDE RISK:   Mild:  Suicidal ideation of limited frequency, intensity, duration, and specificity.  There are no identifiable plans, no associated intent, mild dysphoria and related symptoms, good self-control (both objective and subjective assessment), few other risk factors, and identifiable protective factors, including available and accessible social support.  PLAN OF CARE: Supportive approach/coping skills/relapse prevention                               Detox/refer to [facility]  I certify that inpatient services furnished can reasonably be expected to improve the patient's condition.  Rosselyn Martha A 09/24/2012, 6:14 PM

## 2012-09-24 NOTE — Tx Team (Signed)
Initial Interdisciplinary Treatment Plan  PATIENT STRENGTHS: (choose at least two) Capable of independent living General fund of knowledge Motivation for treatment/growth Supportive family/friends  PATIENT STRESSORS: Financial difficulties Substance abuse   PROBLEM LIST: Problem List/Patient Goals Date to be addressed Date deferred Reason deferred Estimated date of resolution  "I need help to stop drinking."      "I want at least a 30 day program for rehab."        depression      Hx suicide attempt, although pt denies SI at this time      Substance abuse-alcohol, cocaine, THC                               DISCHARGE CRITERIA:  Ability to meet basic life and health needs Adequate post-discharge living arrangements Improved stabilization in mood, thinking, and/or behavior Motivation to continue treatment in a less acute level of care Verbal commitment to aftercare and medication compliance Withdrawal symptoms are absent or subacute and managed without 24-hour nursing intervention  PRELIMINARY DISCHARGE PLAN: Attend aftercare/continuing care group Attend 12-step recovery group Return to previous living arrangement  PATIENT/FAMIILY INVOLVEMENT: This treatment plan has been presented to and reviewed with the patient, [name], and/or family member.  The patient and family have been given the opportunity to ask questions and make suggestions.  [name] [facility] 09/24/2012, 12:01 AM

## 2012-09-24 NOTE — Progress Notes (Signed)
Vol admit to the 300 hall requesting detox from alcohol.  Pt reports he used cocaine and THC occasionally, but hasn't used in about 6-7 months.  Pt reports drinking 8-9 40oz beers daily. Pt states he is not feeling any withdrawal symptoms at this time.  ED reports pt was drinking when he arrived at the hospital.  BAL on arrival was 403.  Pt states he is tired of drinking and needs detox.  He says he lives with his girlfriend's brother and they are his only supports.  He has hx of HTN, but BP was WNL on admission.  Chest x-ray at the ED shows pt has emphysema.  Pt has hx of back surgery, hemorrhoid surgery, and hernia repair.  Pt reports he wants long term rehab, and then he can return to his girlfriend's brother's home.  Pt denies SI/HI/AV, but states he has tried to hurt himself in the past.  Pt was cooperative with the admission process.  Pt was started on the LIbrium protocol for alcohol detox.  He was oriented to unit/room.  Safety checks q15 minutes initiated.

## 2012-09-24 NOTE — Progress Notes (Signed)
D:  Patient up and active in the milieu today.  Has attended groups and is interacting with peers.  A bit shaky this morning, but progressing through the detox process.  Denies depressive symptoms or suicidal thoughts.   A:  Medications as ordered.  Encouraged participation in groups.  Educated about withdrawal symptoms.   R:  Pleasant and cooperative.  Interacting well with staff and peers.  Tolerating detox medications well.

## 2012-09-24 NOTE — Progress Notes (Signed)
[facility] LCSW Group Therapy  09/24/2012 4:25 PM  [facility] LCSW Group Therapy  09/24/2012   Type of Therapy: Group Therapy at 1:15  Participation Level: Active   Participation Quality: Appropriate and Attentive   Affect: Appropriate   Cognitive: Alert and Appropriate   Insight: Developing/Improving   Engagement in Therapy: Developing/Improving   Modes of Intervention: Activity, Clarification, Discussion, Education, Socialization and Support   Summary of Progress/Problems: Patient was attentive and engaged with speaker from [employer]. Patient expressed interest in their programs and services. Patient processed ways they can relate to the speaker. Patient thanked speaker for coming in and shared his hopes of going to [facility].  Clide Dales 09/24/2012, 4:25 PM

## 2012-09-24 NOTE — Progress Notes (Signed)
[facility] LCSW Aftercare Discharge Planning Group Note  09/24/2012 8:45 AM  Participation Quality:  Attentive and Sharing  Affect:  Depressed and Flat  Cognitive:  Alert and Oriented  Insight:  Limited  Engagement in Group:  Limited  Modes of Intervention:  Clarification, Exploration, Rapport Building and Support  Summary of Progress/Problems:  Patient was able to share in group that he has been in similar situation before and it has only gotten worse this time.  Patient reports he completed [facility] program 2 years ago.Patient feels inpatient program would best suit his needs for follow up at discharge.   Clide Dales 09/24/2012, 12:47 PM

## 2012-09-25 DIAGNOSIS — F10939 Alcohol use, unspecified with withdrawal, unspecified: Secondary | ICD-10-CM

## 2012-09-25 DIAGNOSIS — F3289 Other specified depressive episodes: Secondary | ICD-10-CM

## 2012-09-25 DIAGNOSIS — F1020 Alcohol dependence, uncomplicated: Secondary | ICD-10-CM

## 2012-09-25 DIAGNOSIS — F10239 Alcohol dependence with withdrawal, unspecified: Secondary | ICD-10-CM

## 2012-09-25 DIAGNOSIS — F329 Major depressive disorder, single episode, unspecified: Secondary | ICD-10-CM

## 2012-09-25 MED ORDER — TRAZODONE HCL 100 MG PO TABS
100.0000 mg | ORAL_TABLET | Freq: Every evening | ORAL | Status: DC | PRN
  Filled 2012-09-25: qty 1
  Filled 2012-09-25: qty 14

## 2012-09-25 NOTE — Progress Notes (Signed)
Psychoeducational Group Note  Date:  09/25/2012 Time:  2000  Group Topic/Focus:  NA  Participation Level: Did Not Attend  Participation Quality:  Not Applicable  Affect:  Not Applicable  Cognitive:  Not Applicable  Insight:  Not Applicable  Engagement in Group: Not Applicable  Additional Comments:    Flonnie Hailstone 09/25/2012, 9:55 PM

## 2012-09-25 NOTE — Progress Notes (Signed)
[facility] LCSW Group Therapy  09/25/2012   Type of Therapy:  Group Therapy  Participation Level:  Active  Participation Quality:  Appropriate and Sharing  Affect:  Lethargic  Cognitive:  Alert and Oriented  Insight:  Limited  Engagement in Therapy:  Improving  Modes of Intervention:  Clarification, Exploration and Support  Summary of Progress/Problems: Patient joined conversation late in group and shared that "I can't get away from my big cup (of alcohol)."  Patient did process that once he fills it up he continues to drink for that day.  Patient reports that he has only been able to stay sober while housed in a facility.  "That gets real hard because you don't have much freedom."  Clide Dales 09/25/2012,

## 2012-09-25 NOTE — Progress Notes (Signed)
[facility] MD Progress Note  09/25/2012 4:13 PM [name]  MRN:  [id number] Subjective:  Still detoxing. He is feeling weak. He did not sleep that well last night. Not sure what is going to happen with him. He did well when he went to [facility]. Wants to go back there. He is not eating that much. Still feels "sick." Feels depressed. Diagnosis:  Alcohol Dependence, withdrawal, Depressive disorder NOS  ADL's:  Intact  Sleep: Poor  Appetite:  Poor  Suicidal Ideation:  Plan:  denies Intent:  denies Means:  denies Homicidal Ideation:  Plan:  denies Intent:  denies Means:  denies AEB (as evidenced by):  Psychiatric Specialty Exam: Review of Systems  Constitutional: Positive for malaise/fatigue.  HENT: Negative.   Eyes: Negative.   Respiratory: Negative.   Cardiovascular: Negative.   Gastrointestinal: Positive for nausea.  Genitourinary: Negative.   Musculoskeletal: Positive for myalgias.  Skin: Negative.   Neurological: Positive for tremors and weakness.  Endo/Heme/Allergies: Negative.   Psychiatric/Behavioral: Positive for depression and substance abuse. The patient is nervous/anxious and has insomnia.     Blood pressure 119/88, pulse 88, temperature 97.8 F (36.6 C), temperature source Oral, resp. rate 16, height 5' 5.5" (1.664 m), weight 56.7 kg (125 lb).Body mass index is 20.48 kg/(m^2).  General Appearance: Disheveled  Eye Contact::  Minimal  Speech:  Clear and Coherent, Slow and not spontaneous  Volume:  Decreased  Mood:  Depressed  Affect:  Restricted  Thought Process:  Coherent and Goal Directed  Orientation:  Full (Time, Place, and Person)  Thought Content:  WDL and worries  Suicidal Thoughts:  No  Homicidal Thoughts:  No  Memory:  Immediate;   Fair Recent;   Fair Remote;   Fair  Judgement:  Fair  Insight:  Shallow  Psychomotor Activity:  Decreased  Concentration:  Fair  Recall:  Fair  Akathisia:  No  Handed:  Right  AIMS (if indicated):     Assets:  Desire  for Improvement  Sleep:  Number of Hours: 5.75    Current Medications: Current Facility-Administered Medications  Medication Dose Route Frequency Provider Last Rate Last Dose  . acetaminophen (TYLENOL) tablet 650 mg  650 mg Oral Q6H PRN Verne Spurr, PA-C      . alum & mag hydroxide-simeth (MAALOX/MYLANTA) 200-200-20 MG/5ML suspension 30 mL  30 mL Oral Q4H PRN Verne Spurr, PA-C      . chlordiazePOXIDE (LIBRIUM) capsule 25 mg  25 mg Oral Q6H PRN Verne Spurr, PA-C      . chlordiazePOXIDE (LIBRIUM) capsule 25 mg  25 mg Oral TID Verne Spurr, PA-C   25 mg at 09/25/12 1141   Followed by  . chlordiazePOXIDE (LIBRIUM) capsule 25 mg  25 mg Oral BH-qamhs Verne Spurr, PA-C       Followed by  . chlordiazePOXIDE (LIBRIUM) capsule 25 mg  25 mg Oral Daily Verne Spurr, PA-C      . hydrOXYzine (ATARAX/VISTARIL) tablet 25 mg  25 mg Oral Q6H PRN Verne Spurr, PA-C   25 mg at 09/24/12 2141  . loperamide (IMODIUM) capsule 2-4 mg  2-4 mg Oral PRN Verne Spurr, PA-C      . magnesium hydroxide (MILK OF MAGNESIA) suspension 30 mL  30 mL Oral Daily PRN Verne Spurr, PA-C      . multivitamin with minerals tablet 1 tablet  1 tablet Oral Daily Verne Spurr, PA-C   1 tablet at 09/25/12 0801  . nicotine (NICODERM CQ - dosed in mg/24 hours) patch 21 mg  21 mg Transdermal Q0600 Verne Spurr, PA-C      . ondansetron (ZOFRAN-ODT) disintegrating tablet 4 mg  4 mg Oral Q6H PRN Verne Spurr, PA-C      . thiamine (B-1) injection 100 mg  100 mg Intramuscular Once [employer], PA-C      . thiamine (VITAMIN B-1) tablet 100 mg  100 mg Oral Daily Verne Spurr, PA-C   100 mg at 09/25/12 0801  . traZODone (DESYREL) tablet 100 mg  100 mg Oral QHS PRN Rachael Fee, MD        Lab Results:  Results for orders placed during the hospital encounter of 09/23/12 (from the past 48 hour(s))  ETHANOL     Status: Abnormal   Collection Time   09/23/12  8:00 PM      Component Value Range Comment   Alcohol, Ethyl (B) 132 (*) 0  - 11 mg/dL     Physical Findings: AIMS: Facial and Oral Movements Muscles of Facial Expression: None, normal Lips and Perioral Area: None, normal Jaw: None, normal Tongue: None, normal,Extremity Movements Upper (arms, wrists, hands, fingers): None, normal Lower (legs, knees, ankles, toes): None, normal, Trunk Movements Neck, shoulders, hips: None, normal, Overall Severity Severity of abnormal movements (highest score from questions above): None, normal Incapacitation due to abnormal movements: None, normal Patient's awareness of abnormal movements (rate only patient's report): No Awareness, Dental Status Current problems with teeth and/or dentures?: No Does patient usually wear dentures?: No  CIWA:  CIWA-Ar Total: 2  COWS:     Treatment Plan Summary: Daily contact with patient to assess and evaluate symptoms and progress in treatment Medication management  Plan: Supportive approach/coping skills/relapse prevention           Continue detox           Reassess comorbidities  Medical Decision Making Problem Points:  Review of psycho-social stressors (1) Data Points:  Review of medication regiment & side effects (2)  I certify that inpatient services furnished can reasonably be expected to improve the patient's condition.   Macee Venables A 09/25/2012, 4:13 PM

## 2012-09-25 NOTE — Tx Team (Addendum)
Interdisciplinary Treatment Plan Update (Adult)  Date: 09/25/2012  Time Reviewed: 9:24 AM   Progress in Treatment:  Attending groups: Yes.  Participating in groups: Yes.  Taking medication as prescribed: Yes.  Tolerating medication: Yes.  Family/Significant othe contact made: No, will contact once CSW has consent Patient understands diagnosis: Yes.  Discussing patient identified problems/goals with staff: Yes.  Medical problems stabilized or resolved: Yes.  Denies suicidal/homicidal ideation: Yes.  Issues/concerns per patient self-inventory: No.  Other:  New problem(s) identified: No, Describe:   Discharge Plan or Barriers: Patient well enough to call [facility] today, no beds available  Reason for Continuation of Hospitalization:  Medication stabilization Withdrawal symptoms   Comments: NA Estimated length of stay: 3-4 days New goal(s): NA  Review of initial/current patient goals per problem list:  1. Goal: Patient will be able to identify effective and ineffective coping patterns   Met: No   Target Date: Discharge  As evidenced by: patient's group attendance and participation 2. Goal(s): Address suicidal ideation  Met: No  Target date: Discharge As evidenced by: Contact with Support and suicide prevention education as patient has history of attempt 3. Goal (s): Reduce depressive symptoms from a 10 to a 3  Met: No  Target date: 3-4 days  As evidenced by: Pt rates at a 6 today 4. Complete Detox Protocol and Identify comprehensive mental wellness and sobriety plan  Met: No  Target date: Discharge As evidenced YN:WGNF report, with support of CSW who continues to call [facility] daily   Attendees:  Patient:  2/5/20149:24 AM   Family:  2/5/20149:24 AM   Physician: Geoffery Lyons,  2/5/20149:24 AM   Nursing: Roswell Miners, RN  2/5/20149:24 AM   Case Manager: Ronda Fairly, LCSWA  2/5/20149:24 AM   Other: Nestor Ramp, RN  2/5/20149:24 AM   Other: Lucia Estelle, NP  2/5/20149:24 AM    Other: Olivia Mackie, Psych Intern  2/5/20149:24 AM   Other: Jonell Cluck, Nursing Student  2/5/20149:24 AM   Other: Leo Rod, PA Intern  2/5/20149:24 AM   Other:  2/5/20149:24 AM   Other:  2/5/20149:24 AM   Other:  2/5/20149:24 AM   Other:  2/5/20149:24 AM   Other:  2/5/20149:24 AM   Other:  2/5/20149:24 AM   Scribe for Treatment Team:  Clide Dales, 09/25/2012, 9:24 AM

## 2012-09-25 NOTE — Progress Notes (Signed)
[facility] LCSW Aftercare Discharge Planning Group Note  09/25/2012   Participation Quality:  Drowsy  Affect:  Depressed and Flat  Cognitive:  Oriented  Insight:  Limited  Engagement in Group:  Limited  Modes of Intervention:  Clarification, Exploration and Rapport Building  Summary of Progress/Problems: Pt denies suicidal and homicidal ideation.  On a scale of 1 to 10 with ten being the most ever experienced, the patient rates depression at a 6 and anxiety at a 0. Patient continues to report he only wishes to go to [facility]; CSW shared that all placements will need to be explored; patient accepting of the information.      Clide Dales 09/25/2012,

## 2012-09-25 NOTE — Progress Notes (Signed)
Pt observed in his room in bed asleep.  He has been in bed since the beginning of this writer's shift at 1900.  He easily responded to his name being called.  He reports he is doing ok, just tired.  He denies SI/HI/AV.  He says he feels a little shaky, but having minimal withdrawal symptoms.  He says he attended some groups today.  He is still unsure of his discharge plans.  Pt was encouraged to make his needs known to staff.  Support and encouragement given.  Safety maintained with q15 minute checks.

## 2012-09-25 NOTE — Progress Notes (Signed)
Adult Psychoeducational Group Note  Date:  09/25/2012 Time:  11:49 AM  Group Topic/Focus:  Gratitude group. Group consisted of going over the gratitude worksheet and discussion personal views about the topic. Group ended with reading the graditutde prayer and talking about the value of saying it everyday.   Participation Level:  Active  Participation Quality:  Drowsy  Affect:  Flat  Cognitive:  Oriented  Insight: Good  Engagement in Group:  Engaged  Modes of Intervention:  Discussion, Education and Support  Additional Comments:  Pt is looking forward to continuing treatment at [facility] and wants to work substance abuse while in the hospital.  Berton Mount T 09/25/2012, 11:49 AM

## 2012-09-25 NOTE — Progress Notes (Addendum)
D: Patient denies SI/HI and auditory and visual hallucinations. The patient has a depressed mood and affect. The patient states that he feels "better today" but that he "didn't get enough sleep." The patient states that he wants to speak to the MD today to discuss getting "somthing for sleep." The patient states that he has no complaints or questions at this time and that he "physically feels fine."  A: Patient given emotional support from RN. Patient encouraged to come to staff with concerns and/or questions. Patient's medication routine continued. Patient's orders and plan of care reviewed.   R: Patient remains appropriate and cooperative. Will continue to monitor patient q15 minutes for safety.

## 2012-09-26 MED ORDER — TRAZODONE HCL 100 MG PO TABS: 100.0000 mg | ORAL_TABLET | Freq: Every evening | ORAL | Status: DC | PRN

## 2012-09-26 NOTE — Progress Notes (Signed)
[facility] LCSW Aftercare Discharge Planning Group Note  09/26/2012 10:59 AM  Participation Quality:  Appropriate  Affect:  Appropriate  Cognitive:  Alert and Oriented  Insight:  Developing/Improving  Engagement in Group:  Developing/Improving  Modes of Intervention:  Clarification, Exploration, Rapport Building and Socialization  Summary of Progress/Problems: [name] looks like he is feeling better today.  Patient was open to sharing his experience at [facility] with other group member who is considering program.  [name] continues to report that he is interested in [place].   Clide Dales 09/26/2012, 10:59 AM

## 2012-09-26 NOTE — [facility] Suicide Risk Assessment (Signed)
Suicide Risk Assessment  Discharge Assessment     Demographic Factors:  Living alone and Unemployed  Mental Status Per Nursing Assessment::   On Admission:  NA (Pt denies suicidal ideation)  Current Mental Status by Physician: In full contact with reality. His mood is euthymic. His affect is approppiate. He is fully detox. He will go to [facility] today   Loss Factors: NA  Historical Factors: NA  Risk Reduction Factors:   wants to get better  Continued Clinical Symptoms:  Depression:   Comorbid alcohol abuse/dependence Alcohol/Substance Abuse/Dependencies  Cognitive Features That Contribute To Risk: None identified   Suicide Risk:  Minimal: No identifiable suicidal ideation.  Patients presenting with no risk factors but with morbid ruminations; may be classified as minimal risk based on the severity of the depressive symptoms  Discharge Diagnoses:   AXIS I:  Alcohol dependence, S/P alcohol withdrawal/ substance induced mood disorder AXIS II:  Deferred AXIS III:   Past Medical History  Diagnosis Date  . Hypertension   . Shortness of breath   . COPD (chronic obstructive pulmonary disease)   . Depression    AXIS IV:  problems with primary support group AXIS V:  61-70 mild symptoms  Plan Of Care/Follow-up recommendations:  Activity:  As tolerated Diet:  Regular [facility] Is patient on multiple antipsychotic therapies at discharge:  No   Has Patient had three or more failed trials of antipsychotic monotherapy by history:  No  Recommended Plan for Multiple Antipsychotic Therapies: N/A   [name] A 09/26/2012, 1:00 PM

## 2012-09-26 NOTE — Progress Notes (Signed)
Pt observed in his room in bed asleep.  He is easily awakened in response to his name.  He says he is starting to feel the effects of detox.  He feels anxious and shaky.  He said he was dizzy earlier this evening.  He is having mild nausea.  Pt stays to himself and forwards little with staff.  Pt is encouraged to make his needs known to staff.  Pt was given ginger ale to settle his stomach.  He denies SI/HI/AV at this time.  Pt says he wants to go to [facility] at discharge.  Support/encouragement given.  Safety maintained with q15 minute checks.

## 2012-09-26 NOTE — Progress Notes (Signed)
[facility] Adult Case Management Discharge Plan :  Will you be returning to the same living situation after discharge: No. At discharge, do you have transportation home?:Yes,  [facility] transporting to [facility], [place] Do you have the ability to pay for your medications:No.; not applicable  Release of information consent forms completed and in the chart;  Patient's signature needed at discharge.  Patient to Follow up at: Follow-up Information    Follow up with [facility]. On 09/26/2012. ([facility] will pick you up at [facility] for transport today)    Contact information:   [address] [place], [place]  [address] [facility] [phone] FAX: [phone]         Patient denies SI/HI:   Yes,      Safety Planning and Suicide Prevention discussed:  Yes,  in discharge planning group  Clide Dales 09/26/2012, 11:38 AM

## 2012-09-26 NOTE — Discharge Summary (Signed)
Physician Discharge Summary Note  Patient:  [name] is an 47 y.o., male MRN:  [id number] DOB:  [birth year] Patient phone:  [phone] (home)  Patient address:   [address] [place] [address],   Date of Admission:  09/23/2012 Date of Discharge: 09/26/12  Reason for Admission:  Alcohol detoxification  Discharge Diagnoses: Principal Problem:  *Alcohol dependence Active Problems:  Alcohol abuse, continuous  Alcohol withdrawal  Review of Systems  Constitutional: Negative.   HENT: Negative.   Eyes: Negative.   Cardiovascular: Negative.   Gastrointestinal: Negative.   Genitourinary: Negative.   Musculoskeletal: Negative.   Skin: Negative.   Neurological: Negative.   Endo/Heme/Allergies: Negative.   Psychiatric/Behavioral: Positive for substance abuse (Hx alcoholism, received detoxification treatment.). Negative for depression, suicidal ideas, hallucinations and memory loss. The patient has insomnia (Stabilized with medication prior to discharge). The patient is not nervous/anxious.    Axis Diagnosis:   AXIS I:  Alcohol abuse, continuous, Alcohol dependence AXIS II:  Deferred AXIS III:   Past Medical History  Diagnosis Date  . Hypertension   . Shortness of breath   . COPD (chronic obstructive pulmonary disease)   . Depression    AXIS IV:  other psychosocial or environmental problems and Substance abuse issues AXIS V:  63  Level of Care:  [facility]  Hospital Course:  This is a 47 year old African-American male, admitted to [facility] from the [facility] ED with complaints of alcohol intoxication requesting detoxification treatment. Patient reports, "I was taken to the [facility] ED yesterday by my brother in-law because I was drunk. I'm an alcoholic. I have been drinking heavily since the age of 13. I need help to stop drinking alcohol. I need to be detoxified here, and then send me to [facility] for further substance abuse treatment because I abuse cocaine  and pills too. I was at [facility] 2 years ago for my drinking problems. Going to [facility] at the that time helped me because I stayed sober x 10 months afterwards. I just could not maintain sobriety. I fall off the wagon too soon. That is why I need to go to treatment for a long time. I was at [facility] in [place] about 8 years ago also for my alcohol abuse. I can control myself when it pertains to alcohol. I enjoy the taste of alcohol. Besides, alcohol gives me the buzz as well. I like the buzz that I get from drinking alcohol. Alcohol has also caused me quite much. I have lost jobs, relationships and had received DUI charge in the past. I'm not depressed, suicidal and or homicidal".   After admission assessment and evaluation, it was determined that [name] will need detoxification treatment to stabilize his body from alcohol intoxication and also, combat the withdrawal symptoms of alcohol. And his discharge plans included a referral to a long term treatment facility for more intense substance abuse treatment. [name] was then started on Librium protocol for his alcohol detoxification treatment. He was also enrolled in group counseling sessions and activities to learn coping skills that will help help him after discharge to cope better, manage his substance abuse problems to maintain a much longer sobriety. He also was enrolled and attended AA/NA meetings being offered and held on this unit. He has no previous and or identifiable medical conditions that required treatment and or monitoring. However, he was monitored closely for any potential problems that may arise as a result of and or during detoxification treatment. Patient tolerated  detoxification treatment without any significant adverse effects and or reactions reported.  During sessions, patient as well as other patients were informed the benefit of AA/NA meeting.He was encouraged to take advantage of the free weekly meetings to reap the benefit. He was encouraged  to join/attend weekly AA/NA meetings being offered and held within his community. He is encouraged to get a trusted sponsor from the advise of others and or from whomever within the AA meetings seems to make sense, and has a proven track record, and will hold him responsible for his sobriety, and both expects and insists on his total  abstinence from alcohol.   Patient attended treatment team meeting this am and met with the team. His reason for admission, symptoms, substance abuse issues, response to treatment and discharge plans discussed. Patient endorsed that he is doing well and stable for discharge to pursue the next phase of his substance abuse treatment. It was agreed then upon between patient and the team that he will be discharged to  [facility] residential treatment for further substance abuse treatment. Upon discharge, patient adamantly denies suicidal, homicidal ideations, auditory, visual hallucinations, delusional thinking and or withdrawal symptoms. Patient left [facility] with all personal belongings in no apparent distress. He received 2 weeks worth samples of his discharge medications. Transportation per [facility].  Consults:  None  Significant Diagnostic Studies:  labs: CBC with diff, CMP, UDS, Toxicology tests  Discharge Vitals:   Blood pressure 107/71, pulse 75, temperature 97.9 F (36.6 C), temperature source Oral, resp. rate 20, height 5' 5.5" (1.664 m), weight 56.7 kg (125 lb). Body mass index is 20.48 kg/(m^2). Lab Results:   Results for orders placed during the hospital encounter of 09/23/12 (from the past 72 hour(s))  ETHANOL     Status: Abnormal   Collection Time   09/23/12  8:00 PM      Component Value Range Comment   Alcohol, Ethyl (B) 132 (*) 0 - 11 mg/dL     Physical Findings: AIMS: Facial and Oral Movements Muscles of Facial Expression: None, normal Lips and Perioral Area: None, normal Jaw: None, normal Tongue: None, normal,Extremity Movements Upper (arms, wrists, hands,  fingers): None, normal Lower (legs, knees, ankles, toes): None, normal, Trunk Movements Neck, shoulders, hips: None, normal, Overall Severity Severity of abnormal movements (highest score from questions above): None, normal Incapacitation due to abnormal movements: None, normal Patient's awareness of abnormal movements (rate only patient's report): No Awareness, Dental Status Current problems with teeth and/or dentures?: No Does patient usually wear dentures?: No  CIWA:  CIWA-Ar Total: 0  COWS:     Psychiatric Specialty Exam: See Psychiatric Specialty Exam and Suicide Risk Assessment completed by Attending Physician prior to discharge.  Discharge destination:  [facility]  Is patient on multiple antipsychotic therapies at discharge:  No   Has Patient had three or more failed trials of antipsychotic monotherapy by history:  No  Recommended Plan for Multiple Antipsychotic Therapies: NA     Medication List     As of 09/26/2012  3:15 PM    STOP taking these medications         BC FAST PAIN RELIEF 650-195-33.3 MG Pack   Generic drug: Aspirin-Salicylamide-Caffeine      guaifenesin 100 MG/5ML syrup   Commonly known as: ROBITUSSIN      TAKE these medications      Indication    traZODone 100 MG tablet   Commonly known as: DESYREL   Take 1 tablet (100 mg total) by mouth at  bedtime as needed for sleep. For depression/sleep    Indication: Trouble Sleeping, Major Depressive Disorder        Follow-up Information    Follow up with [facility]. On 09/26/2012. ([facility] will pick you up at [facility] for transport today)    Contact information:   [address] [place], [place]  [address] [facility] [phone] FAX: [phone]         Follow-up recommendations:  Activity:  as tolerated Other:  Keep all scheduled follow-up appointments as recommended.    Comments:  Take all your medications as prescribed by your mental healthcare provider. Report any adverse effects and or reactions from your  medicines to your outpatient provider promptly. Patient is instructed and cautioned to not engage in alcohol and or illegal drug use while on prescription medicines. In the event of worsening symptoms, patient is instructed to call the crisis hotline, 911 and or go to the nearest ED for appropriate evaluation and treatment of symptoms. Follow-up with your primary care provider for your other medical issues, concerns and or health care needs.     Total Discharge Time:  Greater than 30 minutes.  SignedArmandina Stammer I 09/26/2012, 3:15 PM

## 2012-09-26 NOTE — Progress Notes (Signed)
D: Patient denies SI/HI and auditory and visual hallucinations. The patient has a depressed mood and affect. The patient states that he feels "a little bit better today" but that he can tell that he is "going through detox." The patient reports sleeping fairly well and states that his appetite is improving and that his energy level is low. The patient is attending some groups throughout the day and is interacting appropriately within the milieu.  A: Patient given emotional support from RN. Patient encouraged to come to staff with concerns and/or questions. Patient's medication routine continued and patient given education regarding detox medication. Patient's orders and plan of care reviewed.   R: Patient remains appropriate and cooperative. Will continue to monitor patient q15 minutes for safety.

## 2012-09-26 NOTE — Progress Notes (Signed)
Pt discharged per MD orders; pt currently denies SI/HI and auditory/visual hallucinations; pt was given education by RN regarding follow-up appointments and medications and pt denied any questions or concerns about these instructions; pt was then escorted to search room to retrieve his belongings by RN before being discharged to hospital lobby. 
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
## 2012-09-27 NOTE — Discharge Summary (Signed)
Agree with assessment and plan Kaizen Ibsen A. Jenette Rayson, M.D. 

## 2012-09-27 NOTE — H&P (Signed)
Agree with assessment and plan Asencion Loveday A. Claira Jeter, M.D. 

## 2012-10-01 NOTE — Progress Notes (Signed)
Patient Discharge Instructions:  After Visit Summary (AVS):   Faxed to:  10/01/12 Discharge Summary Note:   Faxed to:  10/01/12 Psychiatric Admission Assessment Note:   Faxed to:  10/01/12 Suicide Risk Assessment - Discharge Assessment:   Faxed to:  10/01/12 Faxed/Sent to the Next Level Care provider:  10/01/12 Faxed to [facility] @ [phone]  Jerelene Redden, 10/01/2012, 4:17 PM

## 2012-12-07 ENCOUNTER — Encounter (HOSPITAL_COMMUNITY): Payer: Self-pay | Admitting: *Deleted

## 2012-12-07 ENCOUNTER — Emergency Department (HOSPITAL_COMMUNITY): Payer: Self-pay

## 2012-12-07 ENCOUNTER — Emergency Department (HOSPITAL_COMMUNITY)
Admission: EM | Admit: 2012-12-07 | Discharge: 2012-12-07 | Disposition: A | Discharge status: Home / Self Care | Payer: Self-pay | Attending: Emergency Medicine | Admitting: Emergency Medicine

## 2012-12-07 DIAGNOSIS — R059 Cough, unspecified: Secondary | ICD-10-CM | POA: Insufficient documentation

## 2012-12-07 DIAGNOSIS — J441 Chronic obstructive pulmonary disease with (acute) exacerbation: Secondary | ICD-10-CM | POA: Insufficient documentation

## 2012-12-07 DIAGNOSIS — R112 Nausea with vomiting, unspecified: Secondary | ICD-10-CM | POA: Insufficient documentation

## 2012-12-07 DIAGNOSIS — R0602 Shortness of breath: Secondary | ICD-10-CM | POA: Insufficient documentation

## 2012-12-07 DIAGNOSIS — Z8701 Personal history of pneumonia (recurrent): Secondary | ICD-10-CM | POA: Insufficient documentation

## 2012-12-07 DIAGNOSIS — K429 Umbilical hernia without obstruction or gangrene: Secondary | ICD-10-CM | POA: Insufficient documentation

## 2012-12-07 DIAGNOSIS — F17200 Nicotine dependence, unspecified, uncomplicated: Secondary | ICD-10-CM | POA: Insufficient documentation

## 2012-12-07 DIAGNOSIS — J449 Chronic obstructive pulmonary disease, unspecified: Secondary | ICD-10-CM

## 2012-12-07 DIAGNOSIS — Z8659 Personal history of other mental and behavioral disorders: Secondary | ICD-10-CM | POA: Insufficient documentation

## 2012-12-07 DIAGNOSIS — Z79899 Other long term (current) drug therapy: Secondary | ICD-10-CM | POA: Insufficient documentation

## 2012-12-07 DIAGNOSIS — Z7189 Other specified counseling: Secondary | ICD-10-CM | POA: Insufficient documentation

## 2012-12-07 DIAGNOSIS — R05 Cough: Secondary | ICD-10-CM | POA: Insufficient documentation

## 2012-12-07 DIAGNOSIS — F1010 Alcohol abuse, uncomplicated: Secondary | ICD-10-CM | POA: Insufficient documentation

## 2012-12-07 DIAGNOSIS — I10 Essential (primary) hypertension: Secondary | ICD-10-CM | POA: Insufficient documentation

## 2012-12-07 DIAGNOSIS — Z9889 Other specified postprocedural states: Secondary | ICD-10-CM | POA: Insufficient documentation

## 2012-12-07 MED ORDER — AEROCHAMBER PLUS FLO-VU MEDIUM MISC
1.0000 | Freq: Once | Status: AC
  Administered 2012-12-07: 1
  Filled 2012-12-07 (×2): qty 1

## 2012-12-07 MED ORDER — ALBUTEROL SULFATE HFA 108 (90 BASE) MCG/ACT IN AERS
2.0000 | INHALATION_SPRAY | Freq: Four times a day (QID) | RESPIRATORY_TRACT | Status: DC | PRN
  Administered 2012-12-07: 2 via RESPIRATORY_TRACT
  Filled 2012-12-07: qty 6.7

## 2012-12-07 NOTE — ED Provider Notes (Signed)
Presents with cough for 2 months patient smokes 1.5 packs per day since age 47. On exam patient is no respiratory distress speaks in paragraphs lungs with diffuse scant rhonchi.Marland Kitchen Spent 5 minutes counseling patient on smoking cessation.  Doug Sou, MD 12/07/12 2111

## 2012-12-07 NOTE — ED Notes (Signed)
Pt c/o cough x's a few months. Reports "I had pneumonia before I went into [place]."

## 2012-12-07 NOTE — ED Provider Notes (Signed)
History     CSN: [id number]  Arrival date & time 12/07/12  1826   First MD Initiated Contact with Patient 12/07/12 1831      Chief Complaint  Patient presents with  . Cough    (Consider location/radiation/quality/duration/timing/severity/associated sxs/prior treatment) Patient is a 47 y.o. male presenting with cough. The history is provided by the patient. No language interpreter was used.  Cough Associated symptoms: shortness of breath ( occassional )   Associated symptoms: no chest pain, no chills and no fever   Pt is a 47yo male c/o 18mo hx of productive cough w/ clear sputum. Also c/o occasional SOB w/o chest pain.  States he was tx for pneumonia in 1/14.  Was then reevaluated after tx with antibiotics then sent to [facility] for alcohol abuse.  Pt states he is a 2ppd smoker since he was 47yo.  Also states he has been nauseous with 1 episode of emesis each morning for the past yr.  Use to drink 9-12 40oz alcohol per day but down to 5-6 40oz per day. Denies chest pain, fever, chills, or diarrhea. Denies hx of asthma or COPD.  States he was given an inhaler when dx with pneumonia but states he is almost out of it.  Last used it 2 days ago.  Has also tried cough drops and Goodie powder with minimal relief. Denies abdominal pain but reports unknown umbilical hernia that protrudes on occasion when he "eats too much."   Denies known cardiac hx.   Past Medical History  Diagnosis Date  . Hypertension   . Shortness of breath   . COPD (chronic obstructive pulmonary disease)   . Depression     Past Surgical History  Procedure Laterality Date  . Hemorrhoid surgery    . Mandible fracture surgery    . Back surgery    . Hernia repair      History reviewed. No pertinent family history.  History  Substance Use Topics  . Smoking status: Current Every Day Smoker -- 2.00 packs/day    Types: Cigarettes  . Smokeless tobacco: Not on file  . Alcohol Use: 6.0 oz/week    10 Cans of beer per week       Review of Systems  Constitutional: Negative for fever, chills and fatigue.  Respiratory: Positive for cough and shortness of breath ( occassional ). Negative for chest tightness.   Cardiovascular: Negative for chest pain.  Gastrointestinal: Positive for nausea. Negative for vomiting and diarrhea.    Allergies  Review of patient's allergies indicates no known allergies.  Home Medications   Current Outpatient Rx  Name  Route  Sig  Dispense  Refill  . albuterol (PROVENTIL HFA;VENTOLIN HFA) 108 (90 BASE) MCG/ACT inhaler   Inhalation   Inhale 2 puffs into the lungs every 6 (six) hours as needed for wheezing.         . Aspirin-Salicylamide-Caffeine (BC HEADACHE POWDER PO)   Oral   Take 2 packets by mouth every 6 (six) hours as needed (for pain).           BP 117/81  Pulse 80  Temp(Src) 98 F (36.7 C) (Oral)  Resp 18  SpO2 98%  Physical Exam  Nursing note and vitals reviewed. Constitutional: He appears well-developed and well-nourished. No distress.  Thin male resting comfortably in exam bed. NAD.   HENT:  Head: Normocephalic and atraumatic.  Eyes: Conjunctivae are normal. No scleral icterus.  Neck: Normal range of motion. Neck supple. No JVD present.  Cardiovascular:  Normal rate, regular rhythm and normal heart sounds.   Pulmonary/Chest: Effort normal and breath sounds normal. No respiratory distress. He has no wheezes. He has no rales. He exhibits no tenderness.  Speaks in full paragraphs.   Abdominal: Soft. Bowel sounds are normal. He exhibits no distension and no mass. There is tenderness ( mild perumbilical TTP. ). There is no rebound and no guarding.   Easily reducable periumbilical hernia noted.    Musculoskeletal: Normal range of motion.  Lymphadenopathy:    He has no cervical adenopathy.  Neurological: He is alert.  Skin: Skin is warm and dry. He is not diaphoretic.  Psychiatric: He has a normal mood and affect. His behavior is normal. Judgment and  thought content normal.    ED Course  Procedures (including critical care time)  Labs Reviewed - No data to display Dg Chest 2 View  12/07/2012  *RADIOLOGY REPORT*  Clinical Data: Cough for 7 days, history hypertension, COPD  CHEST - 2 VIEW  Comparison: 09/23/2012  Findings: Upper-normal size of cardiac silhouette. Mediastinal contours and pulmonary vascularity normal. Emphysematous minimal bronchitic changes consistent with COPD. No acute infiltrate, pleural effusion, or pneumothorax. Minimal bibasilar atelectasis. Bones demineralized.  IMPRESSION: COPD changes with minimal bibasilar atelectasis.   Original Report Authenticated By: Ulyses Southward, M.D.      1. COPD (chronic obstructive pulmonary disease)   2. Cough   3. Periumbilical hernia       MDM  Pt reports productive cough x29mo with clear sputum. Hx of pneumonia back in 1/14.  Smoker 2ppd for 67yrs along with alcohol abuse.  Sent to [place] for alcohol rehab.  Denies known hx for asthma or COPD. Denies known cardiac hx.   Has tried albuterol inhaler given to him in Jan after dx of pneumonia, cough drops, and Goodie Powder with minimal relief.  Denies fever but reports nausea and vomiting 1x/day for past yr.    CXR: COPD changes with minimal bibasilar atelectasis.  Consulted Dr. Ethelda Chick.  Will reexamine pt with Dr. Ethelda Chick.   9:06 PM Pt still NAD.  Advised pt that respiratory will provide him with albuterol inhaler with spacer today before he leaves.  Pt is homeless and unemployed, concerned about cost of inhaler and f/u care.  Counseled pt for importance of smoking cessation and alcohol sobriety.  Advised pt to f/u with [facility] Department for community resources for primary care providers as well as substance abuse programs.  Encouraged pt to f/u with PCP for management of COPD and cough as well as help with smoking cessation.  PCP will also be able to refer pt to general surgeon when pt is ready/wanting hernia  repair.  Advised pt to seek medical care immediately if hernia becomes difficult to reduce, increased redness and tenderness.     Rx: albuterol inhaler with spacer.    Vitals: unremarkable. Discharged in stable condition.    Discussed pt with attending during ED encounter.       Junius Finner, PA-C 12/07/12 2115

## 2012-12-07 NOTE — ED Notes (Signed)
PA at bedside.

## 2012-12-07 NOTE — ED Notes (Signed)
Patient is alert and oriented x3.  He was given DC instructions and follow up visit instructions.  Patient gave verbal understanding.  He was DC ambulatory under his own power to home.  V/S stable.  He was not showing any signs of distress on DC 

## 2012-12-08 NOTE — ED Provider Notes (Signed)
Medical screening examination/treatment/procedure(s) were conducted as a shared visit with non-physician practitioner(s) and myself.  I personally evaluated the patient during the encounter  Doug Sou, MD 12/08/12 [phone]

## 2013-05-13 ENCOUNTER — Emergency Department (HOSPITAL_COMMUNITY)
Admission: EM | Admit: 2013-05-13 | Discharge: 2013-05-13 | Disposition: A | Discharge status: Home / Self Care | Payer: Self-pay | Attending: Emergency Medicine | Admitting: Emergency Medicine

## 2013-05-13 ENCOUNTER — Emergency Department (HOSPITAL_COMMUNITY): Payer: Self-pay

## 2013-05-13 DIAGNOSIS — K429 Umbilical hernia without obstruction or gangrene: Secondary | ICD-10-CM | POA: Insufficient documentation

## 2013-05-13 DIAGNOSIS — J441 Chronic obstructive pulmonary disease with (acute) exacerbation: Secondary | ICD-10-CM | POA: Insufficient documentation

## 2013-05-13 DIAGNOSIS — Z720 Tobacco use: Secondary | ICD-10-CM

## 2013-05-13 DIAGNOSIS — F1010 Alcohol abuse, uncomplicated: Secondary | ICD-10-CM | POA: Insufficient documentation

## 2013-05-13 DIAGNOSIS — F17200 Nicotine dependence, unspecified, uncomplicated: Secondary | ICD-10-CM | POA: Insufficient documentation

## 2013-05-13 DIAGNOSIS — I10 Essential (primary) hypertension: Secondary | ICD-10-CM | POA: Insufficient documentation

## 2013-05-13 DIAGNOSIS — Z8659 Personal history of other mental and behavioral disorders: Secondary | ICD-10-CM | POA: Insufficient documentation

## 2013-05-13 DIAGNOSIS — Z716 Tobacco abuse counseling: Secondary | ICD-10-CM

## 2013-05-13 DIAGNOSIS — IMO0002 Reserved for concepts with insufficient information to code with codable children: Secondary | ICD-10-CM | POA: Insufficient documentation

## 2013-05-13 MED ORDER — PREDNISONE 20 MG PO TABS
60.0000 mg | ORAL_TABLET | Freq: Once | ORAL | Status: AC
  Administered 2013-05-13: 60 mg via ORAL
  Filled 2013-05-13: qty 3

## 2013-05-13 MED ORDER — ALBUTEROL SULFATE HFA 108 (90 BASE) MCG/ACT IN AERS: 2.0000 | INHALATION_SPRAY | RESPIRATORY_TRACT | Status: DC | PRN

## 2013-05-13 MED ORDER — PREDNISONE 20 MG PO TABS: 40.0000 mg | ORAL_TABLET | Freq: Every day | ORAL | Status: DC

## 2013-05-13 MED ORDER — ALBUTEROL SULFATE HFA 108 (90 BASE) MCG/ACT IN AERS
2.0000 | INHALATION_SPRAY | RESPIRATORY_TRACT | Status: DC | PRN
  Administered 2013-05-13: 2 via RESPIRATORY_TRACT
  Filled 2013-05-13: qty 6.7

## 2013-05-13 NOTE — ED Notes (Signed)
Bed: WA13 Expected date:  Expected time:  Means of arrival:  Comments: SOB 

## 2013-05-13 NOTE — ED Provider Notes (Signed)
CSN: [id number]     Arrival date & time 05/13/13  1713 History   First MD Initiated Contact with Patient 05/13/13 1728     Chief Complaint  Patient presents with  . Shortness of Breath   (Consider location/radiation/quality/duration/timing/severity/associated sxs/prior Treatment) Patient is a 47 y.o. male presenting with shortness of breath. The history is provided by the patient and medical records. No language interpreter was used.  Shortness of Breath Severity:  Mild Onset quality:  Gradual Duration:  3 weeks Timing:  Constant Progression:  Waxing and waning Chronicity:  Recurrent Context: activity, smoke exposure and weather changes   Relieved by:  Inhaler Worsened by:  Coughing and exertion Ineffective treatments:  NSAIDs Associated symptoms: cough   Associated symptoms: no abdominal pain, no chest pain, no diaphoresis, no fever, no headaches, no rash, no vomiting and no wheezing   Risk factors: alcohol use and tobacco use   Risk factors: no family hx of DVT, no hx of cancer, no hx of PE/DVT, no obesity, no oral contraceptive use, no prolonged immobilization and no recent surgery     DRURY ARDIZZONE is a 47 y.o. male  with a hx of HTN, COPD presents to the Emergency Department complaining of gradual, persistent, progressively worsening cough and SOB for several weeks.  Pt states he is a 2ppd smoker since he was 47yo. He reports he is more frequently short of breath such as pneumonia in January 2014.  He reports his used his albuterol inhaler was prescribed him here in the emergency department when he felt short of breath and this has given good relief however he is out.   He reports he takes several Goody powders per day.  Also states he has been nauseous with 1 episode of nonbloody, nonbilious emesis each morning after his first beer for the past year and a half. Patient drinks drink 8-10 40oz beers per day.  Denies chest pain, fever, chills, abdominal pain, diarrhea, hematochezia,  melena, dysuria, hematuria.  Patient reports he came today because his brother-in-law told him his cough sounded bad. Associated symptoms include cough, shortness of breath with exertion.  Albuterol inhaler makes it better and exertion makes it worse.   Past Medical History  Diagnosis Date  . Hypertension   . Shortness of breath   . COPD (chronic obstructive pulmonary disease)   . Depression    Past Surgical History  Procedure Laterality Date  . Hemorrhoid surgery    . Mandible fracture surgery    . Back surgery    . Hernia repair     No family history on file. History  Substance Use Topics  . Smoking status: Current Every Day Smoker -- 2.00 packs/day    Types: Cigarettes  . Smokeless tobacco: Not on file  . Alcohol Use: 6.0 oz/week    10 Cans of beer per week    Review of Systems  Constitutional: Negative for fever, diaphoresis, appetite change, fatigue and unexpected weight change.  HENT: Negative for mouth sores and neck stiffness.   Eyes: Negative for visual disturbance.  Respiratory: Positive for cough, chest tightness and shortness of breath. Negative for wheezing.   Cardiovascular: Negative for chest pain.  Gastrointestinal: Negative for nausea, vomiting, abdominal pain, diarrhea and constipation.  Endocrine: Negative for polydipsia, polyphagia and polyuria.  Genitourinary: Negative for dysuria, urgency, frequency and hematuria.  Musculoskeletal: Negative for back pain.  Skin: Negative for rash.  Allergic/Immunologic: Negative for immunocompromised state.  Neurological: Negative for syncope, light-headedness and headaches.  Hematological:  Does not bruise/bleed easily.  Psychiatric/Behavioral: Negative for sleep disturbance. The patient is not nervous/anxious.     Allergies  Review of patient's allergies indicates no known allergies.  Home Medications   Current Outpatient Rx  Name  Route  Sig  Dispense  Refill  . albuterol (PROVENTIL HFA;VENTOLIN HFA) 108 (90  BASE) MCG/ACT inhaler   Inhalation   Inhale 2 puffs into the lungs every 6 (six) hours as needed for wheezing.         . Aspirin-Salicylamide-Caffeine (BC HEADACHE POWDER PO)   Oral   Take 2 packets by mouth every 6 (six) hours as needed (for pain).         Marland Kitchen albuterol (PROVENTIL HFA;VENTOLIN HFA) 108 (90 BASE) MCG/ACT inhaler   Inhalation   Inhale 2 puffs into the lungs every 4 (four) hours as needed for wheezing or shortness of breath.   1 Inhaler   3   . predniSONE (DELTASONE) 20 MG tablet   Oral   Take 2 tablets (40 mg total) by mouth daily.   10 tablet   0    BP 128/90  Pulse 87  Temp(Src) 97.9 F (36.6 C) (Oral)  Resp 20  SpO2 97% Physical Exam  Nursing note and vitals reviewed. Constitutional: He is oriented to person, place, and time. He appears well-developed. No distress.  Awake, alert, nontoxic appearance Thin appearing  HENT:  Head: Normocephalic and atraumatic.  Right Ear: Hearing, tympanic membrane, external ear and ear canal normal.  Left Ear: Hearing, tympanic membrane, external ear and ear canal normal.  Nose: Nose normal. No mucosal edema or rhinorrhea. Right sinus exhibits no maxillary sinus tenderness and no frontal sinus tenderness. Left sinus exhibits no maxillary sinus tenderness and no frontal sinus tenderness.  Mouth/Throat: Uvula is midline, oropharynx is clear and moist and mucous membranes are normal. Mucous membranes are not dry. No edematous. No oropharyngeal exudate, posterior oropharyngeal edema, posterior oropharyngeal erythema or tonsillar abscesses.  Eyes: Conjunctivae are normal. No scleral icterus.  Neck: Normal range of motion. Neck supple.  Cardiovascular: Normal rate, regular rhythm, normal heart sounds and intact distal pulses.   Pulmonary/Chest: Effort normal. No respiratory distress. He has decreased breath sounds (Throughout). He has no wheezes. He has no rhonchi. He has no rales.  Abdominal: Soft. Normal appearance and bowel  sounds are normal. He exhibits no distension. There is no tenderness. There is no rebound.  Reducible umbilical hernia No pain to palpation  Musculoskeletal: Normal range of motion. He exhibits no edema.  Neurological: He is alert and oriented to person, place, and time. He exhibits normal muscle tone. Coordination normal.  Speech is clear and goal oriented Moves extremities without ataxia  Skin: Skin is warm and dry. No rash noted. He is not diaphoretic. No erythema.  Psychiatric: He has a normal mood and affect. His behavior is normal.    ED Course  Procedures (including critical care time) Labs Review Labs Reviewed - No data to display Imaging Review Dg Chest 2 View  05/13/2013   CLINICAL DATA:  Shortness of breath. History of COPD and smoking.  EXAM: CHEST  2 VIEW  COMPARISON:  12/07/2012 and 09/23/2012.  FINDINGS: The heart size and mediastinal contours are stable. There are stable emphysematous changes throughout the lungs with mild bibasilar atelectasis or scarring. No superimposed airspace disease or edema is identified. There is no pleural effusion or pneumothorax. No acute osseous findings are seen.  IMPRESSION: Stable findings of chronic obstructive pulmonary disease. No acute cardiopulmonary  process.   Electronically Signed   By: Roxy Horseman   On: 05/13/2013 18:16    MDM   1. COPD exacerbation   2. Alcohol abuse, continuous   3. Tobacco abuse   4. Tobacco abuse counseling      KENYATA GUESS presents with COPD exacerbation after running out of his albuterol inhaler.  Patient reports increasing respiratory difficulty for 9 months after about a pneumonia.  Reports nonproductive cough, no fevers, chills, weight loss, palpitations.  Will give albuterol inhaler here in the emergency department.  Chest x-ray with stable findings of COPD no evidence of consolidation, pneumothorax or pneumonia.  I personally reviewed the imaging tests through PACS system.  I reviewed available  ER/hospitalization records through the EMR.    Patient ambulated in ED with O2 saturations maintained >90, no current signs of respiratory distress. Lung exam improved after albuterol. Prednisone given in the ED and pt will bd dc with 5 day burst. Pt states they are breathing at baseline. Pt has been instructed to continue using prescribed medications and to obtain PCP for further treatments.  Pt given resources for this.   It has been determined that no acute conditions requiring further emergency intervention are present at this time. The patient/guardian have been advised of the diagnosis and plan. We have discussed signs and symptoms that warrant return to the ED, such as changes or worsening in symptoms.   Vital signs are stable at discharge.   BP 128/90  Pulse 87  Temp(Src) 97.9 F (36.6 C) (Oral)  Resp 20  SpO2 97%  Patient/guardian has voiced understanding and agreed to follow-up with the PCP or specialist.         Dierdre Forth, PA-C 05/13/13 1841

## 2013-05-13 NOTE — ED Notes (Signed)
Patient states he is not going to be here long and doesn't want to change into a gown.

## 2013-05-13 NOTE — ED Notes (Addendum)
Per EMS patient with Hx of COPD c/o respiratory difficulty, ongoing for a year. Patient smokes 1.5 pack of cigarrettes per day. Per EMS lungs sound clear, non productive dry cough. Patient states he is at the ED to get a refill on his inhaler. States "I ain't got time to get checked out, I ain't got time for all that. Too much time." Patient states that he will leave if the ED tries to make him take tests such as xray.

## 2013-05-14 NOTE — ED Provider Notes (Signed)
Medical screening examination/treatment/procedure(s) were performed by non-physician practitioner and as supervising physician I was immediately available for consultation/collaboration.  Shon Baton, MD 05/14/13 [phone]

## 2013-06-06 ENCOUNTER — Emergency Department (HOSPITAL_COMMUNITY): Payer: Self-pay

## 2013-06-06 ENCOUNTER — Encounter (HOSPITAL_COMMUNITY): Payer: Self-pay | Admitting: Emergency Medicine

## 2013-06-06 ENCOUNTER — Emergency Department (HOSPITAL_COMMUNITY)
Admission: EM | Admit: 2013-06-06 | Discharge: 2013-06-06 | Disposition: A | Discharge status: Home / Self Care | Payer: Self-pay | Attending: Emergency Medicine | Admitting: Emergency Medicine

## 2013-06-06 DIAGNOSIS — Z79899 Other long term (current) drug therapy: Secondary | ICD-10-CM | POA: Insufficient documentation

## 2013-06-06 DIAGNOSIS — I10 Essential (primary) hypertension: Secondary | ICD-10-CM | POA: Insufficient documentation

## 2013-06-06 DIAGNOSIS — J441 Chronic obstructive pulmonary disease with (acute) exacerbation: Secondary | ICD-10-CM | POA: Insufficient documentation

## 2013-06-06 DIAGNOSIS — Z8659 Personal history of other mental and behavioral disorders: Secondary | ICD-10-CM | POA: Insufficient documentation

## 2013-06-06 DIAGNOSIS — F17200 Nicotine dependence, unspecified, uncomplicated: Secondary | ICD-10-CM | POA: Insufficient documentation

## 2013-06-06 LAB — CBC WITH DIFFERENTIAL/PLATELET
Basophils Absolute: 0.1 10*3/uL (ref 0.0–0.1)
Basophils Relative: 2 % — ABNORMAL HIGH (ref 0–1)
Eosinophils Relative: 5 % (ref 0–5)
HCT: 37.1 % — ABNORMAL LOW (ref 39.0–52.0)
Lymphocytes Relative: 36 % (ref 12–46)
Lymphs Abs: 1.4 10*3/uL (ref 0.7–4.0)
MCHC: 36.1 g/dL — ABNORMAL HIGH (ref 30.0–36.0)
MCV: 96.6 fL (ref 78.0–100.0)
Monocytes Absolute: 0.4 10*3/uL (ref 0.1–1.0)
RDW: 14.1 % (ref 11.5–15.5)
WBC: 3.9 10*3/uL — ABNORMAL LOW (ref 4.0–10.5)

## 2013-06-06 LAB — BASIC METABOLIC PANEL
CO2: 24 mEq/L (ref 19–32)
Calcium: 8.8 mg/dL (ref 8.4–10.5)
Creatinine, Ser: 0.76 mg/dL (ref 0.50–1.35)
GFR calc Af Amer: >90 mL/min (ref >90)
Glucose, Bld: 86 mg/dL (ref 70–99)

## 2013-06-06 MED ORDER — PREDNISONE 20 MG PO TABS
60.0000 mg | ORAL_TABLET | Freq: Once | ORAL | Status: AC
  Administered 2013-06-06: 60 mg via ORAL
  Filled 2013-06-06: qty 3

## 2013-06-06 MED ORDER — ALBUTEROL SULFATE HFA 108 (90 BASE) MCG/ACT IN AERS
2.0000 | INHALATION_SPRAY | Freq: Once | RESPIRATORY_TRACT | Status: AC
  Administered 2013-06-06: 2 via RESPIRATORY_TRACT
  Filled 2013-06-06: qty 6.7

## 2013-06-06 MED ORDER — SODIUM CHLORIDE 0.9 % IV BOLUS (SEPSIS)
1000.0000 mL | Freq: Once | INTRAVENOUS | Status: AC
  Administered 2013-06-06: 1000 mL via INTRAVENOUS

## 2013-06-06 MED ORDER — ALBUTEROL SULFATE HFA 108 (90 BASE) MCG/ACT IN AERS
6.0000 | INHALATION_SPRAY | Freq: Once | RESPIRATORY_TRACT | Status: AC
  Administered 2013-06-06: 6 via RESPIRATORY_TRACT
  Filled 2013-06-06: qty 6.7

## 2013-06-06 MED ORDER — PREDNISONE 10 MG PO TABS: 50.0000 mg | ORAL_TABLET | Freq: Every day | ORAL | Status: DC

## 2013-06-06 NOTE — ED Notes (Addendum)
Pt here via ems for c/o sob x2 days with a cough pt is a smoker pox on ra 90-92 2ln 97%

## 2013-06-06 NOTE — ED Notes (Signed)
Pt ambulated on rm air and stayed a consistant 96% while ambulating

## 2013-06-06 NOTE — ED Notes (Signed)
Bed: WA03 Expected date:  Expected time:  Means of arrival:  Comments: EMS-SOB/tachy

## 2013-06-06 NOTE — ED Notes (Signed)
Pt ambulated on rm air and at the start pt was 95%, half way through the walk the pt started coughing and the pt;s o2 drooped to 90%. By the time the patient returned to his room he was at 87%. Pt stated feeling a little short of breath.

## 2013-06-06 NOTE — ED Provider Notes (Signed)
CSN: [id number]     Arrival date & time 06/06/13  1524 History   First MD Initiated Contact with Patient 06/06/13 1546     Chief Complaint  Patient presents with  . Shortness of Breath   (Consider location/radiation/quality/duration/timing/severity/associated sxs/prior Treatment) Patient is a 47 y.o. male presenting with shortness of breath.  Shortness of Breath Severity:  Moderate Onset quality:  Gradual Duration:  3 days Timing:  Intermittent Progression:  Partially resolved Chronicity:  New Context comment:  Smoking Relieved by:  Rest and oxygen Exacerbated by: smoking, not exertion. Associated symptoms: cough (productive of clear sputum) and sputum production   Associated symptoms: no abdominal pain, no chest pain, no fever and no vomiting     Past Medical History  Diagnosis Date  . Hypertension   . Shortness of breath   . COPD (chronic obstructive pulmonary disease)   . Depression    Past Surgical History  Procedure Laterality Date  . Hemorrhoid surgery    . Mandible fracture surgery    . Back surgery    . Hernia repair     History reviewed. No pertinent family history. History  Substance Use Topics  . Smoking status: Current Every Day Smoker -- 2.00 packs/day    Types: Cigarettes  . Smokeless tobacco: Not on file  . Alcohol Use: 6.0 oz/week    10 Cans of beer per week    Review of Systems  Constitutional: Negative for fever.  HENT: Negative for congestion.   Respiratory: Positive for cough (productive of clear sputum), sputum production and shortness of breath.   Cardiovascular: Negative for chest pain.  Gastrointestinal: Negative for nausea, vomiting, abdominal pain and diarrhea.  All other systems reviewed and are negative.    Allergies  Review of patient's allergies indicates no known allergies.  Home Medications   Current Outpatient Rx  Name  Route  Sig  Dispense  Refill  . albuterol (PROVENTIL HFA;VENTOLIN HFA) 108 (90 BASE) MCG/ACT  inhaler   Inhalation   Inhale 2 puffs into the lungs every 4 (four) hours as needed for wheezing or shortness of breath.   1 Inhaler   3   . Aspirin-Salicylamide-Caffeine (BC HEADACHE POWDER PO)   Oral   Take 2 packets by mouth every 6 (six) hours as needed (for pain).         . predniSONE (DELTASONE) 20 MG tablet   Oral   Take 2 tablets (40 mg total) by mouth daily.   10 tablet   0    BP 127/87  Pulse 105  Temp(Src) 98.3 F (36.8 C) (Oral)  Resp 20  Ht 5\' 7"  (1.702 m)  Wt 135 lb (61.236 kg)  BMI 21.14 kg/m2  SpO2 93% Physical Exam  Nursing note and vitals reviewed. Constitutional: He is oriented to person, place, and time. He appears well-developed and well-nourished. No distress.  HENT:  Head: Normocephalic and atraumatic.  Mouth/Throat: Oropharynx is clear and moist.  Eyes: Conjunctivae are normal. Pupils are equal, round, and reactive to light. No scleral icterus.  Neck: Neck supple.  Cardiovascular: Normal rate, regular rhythm, normal heart sounds and intact distal pulses.   No murmur heard. Pulmonary/Chest: Effort normal. No stridor. No respiratory distress. He has no wheezes. He has rales (mild, LLQ).  Abdominal: Soft. He exhibits no distension. There is no tenderness.  Musculoskeletal: Normal range of motion. He exhibits no edema.  Neurological: He is alert and oriented to person, place, and time.  Skin: Skin is warm and dry. No  rash noted.  Psychiatric: He has a normal mood and affect. His behavior is normal.    ED Course  Procedures (including critical care time) Labs Review Labs Reviewed  CBC WITH DIFFERENTIAL - Abnormal; Notable for the following:    WBC 3.9 (*)    RBC 3.84 (*)    HCT 37.1 (*)    MCH 34.9 (*)    MCHC 36.1 (*)    Basophils Relative 2 (*)    All other components within normal limits  BASIC METABOLIC PANEL - Abnormal; Notable for the following:    Potassium 3.3 (*)    BUN 5 (*)    All other components within normal limits    Imaging Review Dg Chest 2 View  06/06/2013   CLINICAL DATA:  Shortness of breath. Cough. COPD. Hypertension.  EXAM: CHEST  2 VIEW  COMPARISON:  05/13/2013  FINDINGS: Heart size remains within normal limits. Pulmonary hyperinflation again seen, consistent with COPD. No evidence of pulmonary infiltrate or edema. No evidence of pleural effusion. No mass or lymphadenopathy identified. Visualized skeletal structures are unremarkable.  IMPRESSION: Stable exam. COPD. No active disease.   Electronically Signed   By: Myles Rosenthal M.D.   On: 06/06/2013 16:48  All radiology studies independently viewed by me.     EKG Interpretation     Ventricular Rate:  102 PR Interval:  174 QRS Duration: 94 QT Interval:  353 QTC Calculation: 460 R Axis:   68 Text Interpretation:  Sinus tachycardia ST elev, probable normal early repol pattern Baseline wander in lead(s) V2 V3 V4 V5 no prior ecg for comparison            MDM   1. COPD with exacerbation    Well appearing 47 year old male presenting with URI symptoms, cough productive of clear sputum, and shortness of breath which is exacerbated by cigarette smoking.  States he has a history of COPD. No wheezing on my exam but does have mild left lower lung rales.  Is also coughing, so will provide albuterol treatment.  Willl also check chest x-ray and give IV fluids for his mild tachycardia.  I took him off his supplemental O2 and he maintained his O2 sats in high 90's with no increased WOB.  History not consistent with ACS.    O2 sats initially dropped upon ambulation. However, after additional treatments steroids he maintained his O2 sats well with ambulation. He stated he felt better and would like to be discharged. Prescribed steroids, given inhaler, and advised outpatient follow up.  Also counseled him on smoking cessation.    Candyce Churn, MD 06/06/13 2000

## 2013-06-19 ENCOUNTER — Encounter (HOSPITAL_COMMUNITY): Payer: Self-pay | Admitting: Emergency Medicine

## 2013-06-19 ENCOUNTER — Emergency Department (INDEPENDENT_AMBULATORY_CARE_PROVIDER_SITE_OTHER)
Admission: EM | Admit: 2013-06-19 | Discharge: 2013-06-19 | Disposition: A | Discharge status: Home / Self Care | Payer: Self-pay | Source: Home / Self Care | Attending: Family Medicine | Admitting: Family Medicine

## 2013-06-19 ENCOUNTER — Emergency Department (HOSPITAL_COMMUNITY)
Admission: EM | Admit: 2013-06-19 | Discharge: 2013-06-20 | Disposition: A | Discharge status: Other Acute Inpatient Hospital | Payer: Federal, State, Local not specified - Other | Attending: Emergency Medicine | Admitting: Emergency Medicine

## 2013-06-19 ENCOUNTER — Emergency Department (HOSPITAL_COMMUNITY): Payer: Self-pay

## 2013-06-19 DIAGNOSIS — J441 Chronic obstructive pulmonary disease with (acute) exacerbation: Secondary | ICD-10-CM | POA: Insufficient documentation

## 2013-06-19 DIAGNOSIS — IMO0002 Reserved for concepts with insufficient information to code with codable children: Secondary | ICD-10-CM | POA: Insufficient documentation

## 2013-06-19 DIAGNOSIS — F1010 Alcohol abuse, uncomplicated: Secondary | ICD-10-CM | POA: Insufficient documentation

## 2013-06-19 DIAGNOSIS — F17200 Nicotine dependence, unspecified, uncomplicated: Secondary | ICD-10-CM | POA: Insufficient documentation

## 2013-06-19 DIAGNOSIS — F411 Generalized anxiety disorder: Secondary | ICD-10-CM | POA: Insufficient documentation

## 2013-06-19 DIAGNOSIS — Z79899 Other long term (current) drug therapy: Secondary | ICD-10-CM | POA: Insufficient documentation

## 2013-06-19 DIAGNOSIS — I10 Essential (primary) hypertension: Secondary | ICD-10-CM | POA: Insufficient documentation

## 2013-06-19 HISTORY — DX: Alcohol abuse, uncomplicated: F10.10

## 2013-06-19 LAB — COMPREHENSIVE METABOLIC PANEL
ALT: 52 U/L (ref 0–53)
Albumin: 4.1 g/dL (ref 3.5–5.2)
Alkaline Phosphatase: 64 U/L (ref 39–117)
BUN: 7 mg/dL (ref 6–23)
Calcium: 9.2 mg/dL (ref 8.4–10.5)
Creatinine, Ser: 0.86 mg/dL (ref 0.50–1.35)
GFR calc Af Amer: >90 mL/min (ref >90)
Potassium: 3.2 mEq/L — ABNORMAL LOW (ref 3.5–5.1)
Total Bilirubin: 0.2 mg/dL — ABNORMAL LOW (ref 0.3–1.2)
Total Protein: 7.5 g/dL (ref 6.0–8.3)

## 2013-06-19 LAB — ETHANOL
Alcohol, Ethyl (B): 408 mg/dL (ref 0–11)
Alcohol, Ethyl (B): 291 mg/dL — ABNORMAL HIGH (ref 0–11)

## 2013-06-19 LAB — CBC
HCT: 36.2 % — ABNORMAL LOW (ref 39.0–52.0)
MCH: 35.8 pg — ABNORMAL HIGH (ref 26.0–34.0)
MCHC: 36.5 g/dL — ABNORMAL HIGH (ref 30.0–36.0)
MCV: 98.1 fL (ref 78.0–100.0)
RDW: 14.6 % (ref 11.5–15.5)

## 2013-06-19 LAB — RAPID URINE DRUG SCREEN, HOSP PERFORMED
Barbiturates: NOT DETECTED
Benzodiazepines: NOT DETECTED
Cocaine: NOT DETECTED
Tetrahydrocannabinol: NOT DETECTED

## 2013-06-19 LAB — ACETAMINOPHEN LEVEL: Acetaminophen (Tylenol), Serum: <15 ug/mL (ref 10–30)

## 2013-06-19 LAB — URINALYSIS, ROUTINE W REFLEX MICROSCOPIC
Bilirubin Urine: NEGATIVE
Glucose, UA: NEGATIVE mg/dL
Ketones, ur: NEGATIVE mg/dL
Leukocytes, UA: NEGATIVE
Nitrite: NEGATIVE
Specific Gravity, Urine: 1.015 (ref 1.005–1.030)
pH: 5.5 (ref 5.0–8.0)

## 2013-06-19 LAB — SALICYLATE LEVEL: Salicylate Lvl: 12.3 mg/dL (ref 2.8–20.0)

## 2013-06-19 MED ORDER — ALBUTEROL SULFATE HFA 108 (90 BASE) MCG/ACT IN AERS: 2.0000 | INHALATION_SPRAY | Freq: Four times a day (QID) | RESPIRATORY_TRACT | Status: DC | PRN

## 2013-06-19 MED ORDER — IPRATROPIUM BROMIDE 0.02 % IN SOLN
0.5000 mg | Freq: Once | RESPIRATORY_TRACT | Status: AC
  Administered 2013-06-19: 0.5 mg via RESPIRATORY_TRACT

## 2013-06-19 MED ORDER — LORAZEPAM 1 MG PO TABS
0.0000 mg | ORAL_TABLET | Freq: Four times a day (QID) | ORAL | Status: DC
  Administered 2013-06-19 – 2013-06-20 (×2): 1 mg via ORAL
  Filled 2013-06-19 (×2): qty 1

## 2013-06-19 MED ORDER — LORAZEPAM 1 MG PO TABS: 0.0000 mg | ORAL_TABLET | Freq: Two times a day (BID) | ORAL | Status: DC

## 2013-06-19 MED ORDER — IPRATROPIUM BROMIDE 0.02 % IN SOLN
RESPIRATORY_TRACT | Status: AC
  Filled 2013-06-19: qty 2.5

## 2013-06-19 MED ORDER — SODIUM CHLORIDE 0.9 % IN NEBU
INHALATION_SOLUTION | RESPIRATORY_TRACT | Status: AC
  Filled 2013-06-19: qty 6

## 2013-06-19 MED ORDER — ALBUTEROL SULFATE (5 MG/ML) 0.5% IN NEBU
5.0000 mg | INHALATION_SOLUTION | Freq: Once | RESPIRATORY_TRACT | Status: AC
  Administered 2013-06-19: 5 mg via RESPIRATORY_TRACT

## 2013-06-19 MED ORDER — ALBUTEROL SULFATE HFA 108 (90 BASE) MCG/ACT IN AERS
INHALATION_SPRAY | RESPIRATORY_TRACT | Status: AC
  Filled 2013-06-19: qty 6.7

## 2013-06-19 MED ORDER — PREDNISONE 50 MG PO TABS: 50.0000 mg | ORAL_TABLET | Freq: Every day | ORAL | Status: DC

## 2013-06-19 MED ORDER — ALBUTEROL SULFATE (5 MG/ML) 0.5% IN NEBU
INHALATION_SOLUTION | RESPIRATORY_TRACT | Status: AC
  Filled 2013-06-19: qty 1

## 2013-06-19 MED ORDER — ALBUTEROL SULFATE HFA 108 (90 BASE) MCG/ACT IN AERS: 1.0000 | INHALATION_SPRAY | Freq: Four times a day (QID) | RESPIRATORY_TRACT | Status: DC | PRN

## 2013-06-19 MED ORDER — SODIUM CHLORIDE 0.9 % IV BOLUS (SEPSIS)
1000.0000 mL | Freq: Once | INTRAVENOUS | Status: AC
  Administered 2013-06-19: 1000 mL via INTRAVENOUS

## 2013-06-19 NOTE — ED Provider Notes (Signed)
[name] is a 47 y.o. male who presents to Urgent Care today for cough. Patient notes increased coughing with increased sputum production and shortness of breath over one week. He was seen in the emergency room it was a long on October 17 and diagnosed with a COPD exacerbation. He was unable to afford his prednisone. He notes that he is feeling about the same as he was 2 weeks ago. He denies any nausea vomiting or diarrhea.   Past Medical History  Diagnosis Date  . Hypertension   . Shortness of breath   . COPD (chronic obstructive pulmonary disease)   . Depression    History  Substance Use Topics  . Smoking status: Current Every Day Smoker -- 2.00 packs/day    Types: Cigarettes  . Smokeless tobacco: Not on file  . Alcohol Use: 6.0 oz/week    10 Cans of beer per week   ROS as above Medications reviewed. Current Facility-Administered Medications  Medication Dose Route Frequency Provider Last Rate Last Dose  . albuterol (PROVENTIL HFA;VENTOLIN HFA) 108 (90 BASE) MCG/ACT inhaler 1-2 puff  1-2 puff Inhalation Q6H PRN Rodolph Bong, MD       Current Outpatient Prescriptions  Medication Sig Dispense Refill  . albuterol (PROVENTIL HFA;VENTOLIN HFA) 108 (90 BASE) MCG/ACT inhaler Inhale 2 puffs into the lungs every 6 (six) hours as needed for wheezing or shortness of breath.  1 Inhaler  3  . Aspirin-Salicylamide-Caffeine (BC HEADACHE POWDER PO) Take 2 packets by mouth every 6 (six) hours as needed (for pain).      . predniSONE (DELTASONE) 50 MG tablet Take 1 tablet (50 mg total) by mouth daily.  7 tablet  0    Exam:  BP 115/79  Pulse 97  Temp(Src) 97.4 F (36.3 C) (Oral)  Resp 16  SpO2 97% Gen: Well NAD. Patient appears to be intoxicated HEENT: EOMI,  MMM Lungs: Prolonged respiratory phase with wheezing. Normal work of breathing. Frequent coughing Heart: RRR no MRG Abd: NABS, NT, ND Exts: Non edematous BL  LE, warm and well perfused.   Patient was given a 0.5 mg Atrovent and  5 mg albuterol nebulizer treatment. He discontinued the treatment about halfway through Saying it seems to make his coughing worse.  Additionally patient refused a chest x-ray  Assessment and Plan: 47 y.o. male with COPD exacerbation. Oxygen saturation is within normal limits the patient appears to be in not severe respiratory distress. He would like to leave the urgent care now seems possible.  we have arranged with [place] care management a fully paid outpatient prescriptions for prednisone and albuterol. Patient will go to the [facility] outpatient pharmacy and receive 7 days of prednisone and a new albuterol prescription. Additionally we dispensed an albuterol inhaler.    Patient refuses multiple treatment options however I feel that we can safely treat the patient as an outpatient. We strongly advised patient to discontinue smoking, and have referred him to the [facility] [employer].   Discussed warning signs or symptoms. Please see discharge instructions. Patient expresses understanding.    ]  Rodolph Bong, MD 06/19/13 [phone]

## 2013-06-19 NOTE — Care Management Note (Signed)
    Page 1 of 1   06/19/2013     11:10:22 AM   CARE MANAGEMENT NOTE 06/19/2013  Patient:  [name], [name]   Account Number:  [id number]  Date Initiated:  06/19/2013  Documentation initiated by:  Chondra Boyde  Subjective/Objective Assessment:     Action/Plan:   Anticipated DC Date:  06/19/2013   Anticipated DC Plan:        DC Planning Services  MATCH Program      Choice offered to / List presented to:             Status of service:   Medicare Important Message given?   (If response is "NO", the following Medicare IM given date fields will be blank) Date Medicare IM given:   Date Additional Medicare IM given:    Discharge Disposition:    Per UR Regulation:    If discussed at Long Length of Stay Meetings, dates discussed:    Comments:  Patient seen at urgent care this morning and needed medication assistance.  Patient eligible for the [facility] program and per the MD patient was unable to pay the co-pay.  His co-pay was overriden and his MATCH letter was faxes to the [facility] OP pharmacy.

## 2013-06-19 NOTE — ED Notes (Addendum)
Pt changed into blue scrubs. Pt. Has 1 belongings bag and 1 red bag. Pt. Has shirt, shoes, belt, socks,underwear, cell phone, little amount of coin in left pocket, cigarette lighter, thermal shirt, thermal underpants. Pt. And belongings searched and wanded by security. Pt. Belongings locked up in [facility] [address] in TCU near the psych-ed.

## 2013-06-19 NOTE — ED Notes (Addendum)
Pt reports he does not want the albuterol tx anymore... It's making him more nauseas.  Dr. Denyse Amass has been notified.   Also, albuterol inhaler has been dispensed and given to him per Dr. Denyse Amass.... Pt was shown how to use it Notified pt that he Dr. Denyse Amass has called in his meds to [facility] and he will get it for free. Pt verbalized understanding.

## 2013-06-19 NOTE — ED Notes (Signed)
Pt sts he has COPD, asthma and bronchitis, still not feeling well since he was last seen here on the 17th of October. Pt also sts he wants to detox off of alcohol. Pt. Had last drink x 30 minutes ago. A&Ox4.

## 2013-06-19 NOTE — ED Notes (Signed)
Walked pt. While checking pulse oximetry. Pulse ox fluctuated between 91% and 98%. Provider made aware.

## 2013-06-19 NOTE — ED Notes (Signed)
Pt is here for SOB/cold sxs onset 1 week Seen at [facility] ER for similar sxs Given Rx at ER but has not filled it b/c he can't afford it??? Sxs include: cough, SOB, f/v/d Smokes 1.5 PPD... Hx of COPD Alert w/no signs of acute distress.

## 2013-06-19 NOTE — ED Notes (Signed)
Pt was seen this am at urgent care for copd and cough he was told he had to see a specialist and he said that he did not have a job to pay for this. Pt also wants to bee admitted for detox drinking due to he drinking 8 40oz a day and that causes him to drinking more. Denies SI/HI. Last drink was approx 30 mins ago while [place] bus stop.

## 2013-06-19 NOTE — ED Provider Notes (Signed)
CSN: [id number]     Arrival date & time 06/19/13  1916 History   First MD Initiated Contact with Patient 06/19/13 2018     Chief Complaint  Patient presents with  . Medical Clearance  . Cough   (Consider location/radiation/quality/duration/timing/severity/associated sxs/prior Treatment) HPI  This is a 47 year old male with a history of hypertension, shortness of breath, COPD, and alcohol abuse who presents requesting detox. Patient states that he was seen earlier today at urgent care for a COPD exacerbation. He was given outpatient medications including inhaler and prednisone. Patient states that he drinks 4-6 40 ounce beers daily.  He states that he smokes when he drinks and he needs to quit smoking so he was sent for detox from alcohol.  Patient states his last drink was just prior to arrival.  He denies any other drug use. Patient has a history of withdrawal seizures. He currently states that he feels anxious. He denies any chest pain or shortness of breath at this time.  Past Medical History  Diagnosis Date  . Hypertension   . Shortness of breath   . COPD (chronic obstructive pulmonary disease)   . Depression   . Alcohol abuse    Past Surgical History  Procedure Laterality Date  . Hemorrhoid surgery    . Mandible fracture surgery    . Back surgery    . Hernia repair     No family history on file. History  Substance Use Topics  . Smoking status: Current Every Day Smoker -- 2.00 packs/day    Types: Cigarettes  . Smokeless tobacco: Not on file  . Alcohol Use: 6.0 oz/week    10 Cans of beer per week    Review of Systems  Constitutional: Negative for fever.  Respiratory: Negative for chest tightness and shortness of breath.   Cardiovascular: Negative for chest pain.  Psychiatric/Behavioral:       Anxiety  All other systems reviewed and are negative.    Allergies  Review of patient's allergies indicates no known allergies.  Home Medications   Current Outpatient Rx   Name  Route  Sig  Dispense  Refill  . albuterol (PROVENTIL HFA;VENTOLIN HFA) 108 (90 BASE) MCG/ACT inhaler   Inhalation   Inhale 2 puffs into the lungs every 6 (six) hours as needed for wheezing or shortness of breath.   1 Inhaler   3   . Aspirin-Salicylamide-Caffeine (BC HEADACHE POWDER PO)   Oral   Take 2 packets by mouth every 6 (six) hours as needed (for pain).         . predniSONE (DELTASONE) 50 MG tablet   Oral   Take 1 tablet (50 mg total) by mouth daily.   7 tablet   0    BP 128/96  Pulse 104  Temp(Src) 98.3 F (36.8 C) (Oral)  Resp 20  SpO2 96% Physical Exam  Nursing note and vitals reviewed. Constitutional: He is oriented to person, place, and time.  Disheveled, nontoxic, no acute distress  HENT:  Head: Normocephalic and atraumatic.  Mucous membranes dry  Eyes:  Conjunctiva injected  Cardiovascular: Normal rate, regular rhythm and normal heart sounds.   No murmur heard. Pulmonary/Chest: Effort normal. No respiratory distress.  Scant expiratory wheezing  Abdominal: Soft. There is no tenderness.  Musculoskeletal: He exhibits no edema.  Neurological: He is alert and oriented to person, place, and time.  Skin: Skin is warm and dry.  Psychiatric: He has a normal mood and affect.    ED Course  Procedures (including critical care time) Labs Review Labs Reviewed  CBC - Abnormal; Notable for the following:    RBC 3.69 (*)    HCT 36.2 (*)    MCH 35.8 (*)    MCHC 36.5 (*)    All other components within normal limits  COMPREHENSIVE METABOLIC PANEL - Abnormal; Notable for the following:    Sodium 134 (*)    Potassium 3.2 (*)    Chloride 95 (*)    Glucose, Bld 130 (*)    AST 94 (*)    Total Bilirubin 0.2 (*)    All other components within normal limits  ETHANOL - Abnormal; Notable for the following:    Alcohol, Ethyl (B) 408 (*)    All other components within normal limits  ACETAMINOPHEN LEVEL  SALICYLATE LEVEL  URINE RAPID DRUG SCREEN (HOSP  PERFORMED)  URINALYSIS, ROUTINE W REFLEX MICROSCOPIC  ETHANOL   Imaging Review Dg Chest 2 View  06/19/2013   CLINICAL DATA:  Cough, congestion  EXAM: CHEST - 2 VIEW  COMPARISON:  06/06/2013  FINDINGS: The heart size and mediastinal contours are within normal limits. Stable hyperinflation. Both lungs are clear. The visualized skeletal structures are unremarkable. No effusion.  IMPRESSION: No acute cardiopulmonary disease.   Electronically Signed   By: Oley Balm M.D.   On: 06/19/2013 21:31    EKG Interpretation   None       MDM  No diagnosis found.   Patient presents requesting detox. Patient has a history of DTs and drinks daily. Currently being treated for COPD exacerbation. He has scant expiratory wheezing on exam. Patient was ambulated with pulse ox. Lowest pulse ox noted to be 92% which given his history of COPD is acceptable. Chest x-ray is negative. Patient was given a normal saline bolus. Lab work is notable for a blood alcohol level 408.  Patient placed on CIWA.  10:54 PM Repeat EtOH level sent. Patient will need TTS evaluation for detox.    Shon Baton, MD 06/19/13 2255

## 2013-06-19 NOTE — ED Notes (Signed)
Pt. With critical alcohol level of 408. Provider made aware.

## 2013-06-20 ENCOUNTER — Inpatient Hospital Stay (HOSPITAL_COMMUNITY)
Admission: AD | Admit: 2013-06-20 | Discharge: 2013-06-24 | DRG: 897 | Disposition: A | Discharge status: Home / Self Care | Payer: Federal, State, Local not specified - Other | Source: Intra-hospital | Attending: Psychiatry | Admitting: Psychiatry

## 2013-06-20 ENCOUNTER — Encounter (HOSPITAL_COMMUNITY): Payer: Self-pay | Admitting: Intensive Care

## 2013-06-20 ENCOUNTER — Encounter (HOSPITAL_COMMUNITY): Payer: Self-pay | Admitting: *Deleted

## 2013-06-20 DIAGNOSIS — F10239 Alcohol dependence with withdrawal, unspecified: Principal | ICD-10-CM | POA: Diagnosis present

## 2013-06-20 DIAGNOSIS — J449 Chronic obstructive pulmonary disease, unspecified: Secondary | ICD-10-CM | POA: Diagnosis present

## 2013-06-20 DIAGNOSIS — F1010 Alcohol abuse, uncomplicated: Secondary | ICD-10-CM

## 2013-06-20 DIAGNOSIS — F1020 Alcohol dependence, uncomplicated: Secondary | ICD-10-CM | POA: Diagnosis present

## 2013-06-20 DIAGNOSIS — J4489 Other specified chronic obstructive pulmonary disease: Secondary | ICD-10-CM | POA: Diagnosis present

## 2013-06-20 DIAGNOSIS — R0602 Shortness of breath: Secondary | ICD-10-CM | POA: Diagnosis present

## 2013-06-20 DIAGNOSIS — Z79899 Other long term (current) drug therapy: Secondary | ICD-10-CM

## 2013-06-20 DIAGNOSIS — F10939 Alcohol use, unspecified with withdrawal, unspecified: Principal | ICD-10-CM | POA: Diagnosis present

## 2013-06-20 DIAGNOSIS — I10 Essential (primary) hypertension: Secondary | ICD-10-CM | POA: Diagnosis present

## 2013-06-20 MED ORDER — ADULT MULTIVITAMIN W/MINERALS CH
ORAL_TABLET | ORAL | Status: AC
  Filled 2013-06-20: qty 1

## 2013-06-20 MED ORDER — ACETAMINOPHEN 325 MG PO TABS
650.0000 mg | ORAL_TABLET | Freq: Four times a day (QID) | ORAL | Status: DC | PRN
  Administered 2013-06-23: 650 mg via ORAL
  Filled 2013-06-20: qty 2

## 2013-06-20 MED ORDER — HYDROXYZINE HCL 25 MG PO TABS
25.0000 mg | ORAL_TABLET | Freq: Four times a day (QID) | ORAL | Status: AC | PRN
  Administered 2013-06-20 – 2013-06-23 (×5): 25 mg via ORAL
  Filled 2013-06-20 (×6): qty 1

## 2013-06-20 MED ORDER — CHLORDIAZEPOXIDE HCL 25 MG PO CAPS
25.0000 mg | ORAL_CAPSULE | Freq: Every day | ORAL | Status: AC
  Administered 2013-06-24: 25 mg via ORAL

## 2013-06-20 MED ORDER — MAGNESIUM HYDROXIDE 400 MG/5ML PO SUSP: 30.0000 mL | Freq: Every day | ORAL | Status: DC | PRN

## 2013-06-20 MED ORDER — CHLORDIAZEPOXIDE HCL 25 MG PO CAPS
25.0000 mg | ORAL_CAPSULE | ORAL | Status: AC
  Administered 2013-06-23 (×2): 25 mg via ORAL
  Filled 2013-06-20 (×2): qty 1

## 2013-06-20 MED ORDER — CHLORDIAZEPOXIDE HCL 25 MG PO CAPS
25.0000 mg | ORAL_CAPSULE | Freq: Three times a day (TID) | ORAL | Status: AC
  Administered 2013-06-22 (×3): 25 mg via ORAL
  Filled 2013-06-20 (×3): qty 1

## 2013-06-20 MED ORDER — CHLORDIAZEPOXIDE HCL 25 MG PO CAPS
25.0000 mg | ORAL_CAPSULE | Freq: Four times a day (QID) | ORAL | Status: DC | PRN
  Administered 2013-06-20 – 2013-06-21 (×2): 25 mg via ORAL
  Filled 2013-06-20 (×4): qty 1

## 2013-06-20 MED ORDER — ONDANSETRON 4 MG PO TBDP
4.0000 mg | ORAL_TABLET | Freq: Four times a day (QID) | ORAL | Status: AC | PRN
  Administered 2013-06-21 – 2013-06-22 (×4): 4 mg via ORAL
  Filled 2013-06-20 (×5): qty 1

## 2013-06-20 MED ORDER — LOPERAMIDE HCL 2 MG PO CAPS: 2.0000 mg | ORAL_CAPSULE | ORAL | Status: AC | PRN

## 2013-06-20 MED ORDER — ALUM & MAG HYDROXIDE-SIMETH 200-200-20 MG/5ML PO SUSP: 30.0000 mL | ORAL | Status: DC | PRN

## 2013-06-20 MED ORDER — CHLORDIAZEPOXIDE HCL 25 MG PO CAPS
25.0000 mg | ORAL_CAPSULE | Freq: Four times a day (QID) | ORAL | Status: AC
  Administered 2013-06-20 – 2013-06-21 (×6): 25 mg via ORAL
  Filled 2013-06-20 (×6): qty 1

## 2013-06-20 MED ORDER — THIAMINE HCL 100 MG/ML IJ SOLN: 100.0000 mg | Freq: Once | INTRAMUSCULAR | Status: DC

## 2013-06-20 MED ORDER — POTASSIUM CHLORIDE CRYS ER 20 MEQ PO TBCR
40.0000 meq | EXTENDED_RELEASE_TABLET | Freq: Once | ORAL | Status: AC
  Administered 2013-06-20: 40 meq via ORAL
  Filled 2013-06-20: qty 2

## 2013-06-20 MED ORDER — VITAMIN B-1 100 MG PO TABS
100.0000 mg | ORAL_TABLET | Freq: Every day | ORAL | Status: DC
  Administered 2013-06-21 – 2013-06-24 (×4): 100 mg via ORAL
  Filled 2013-06-20 (×5): qty 1

## 2013-06-20 MED ORDER — ADULT MULTIVITAMIN W/MINERALS CH
1.0000 | ORAL_TABLET | Freq: Every day | ORAL | Status: DC
  Administered 2013-06-20 – 2013-06-24 (×5): 1 via ORAL
  Filled 2013-06-20 (×6): qty 1

## 2013-06-20 NOTE — ED Notes (Signed)
Pt transferred from main ed, presents, requesting detox from alcohol.  Pt reports he drinks 5-6 40 oz beers per day.  Denies SI or HI, no AV hallucinations.  Pt calm, cooperative and intoxicated at present.

## 2013-06-20 NOTE — Progress Notes (Signed)
Patient admitted to [facility]. Patient presents for alcohol detox. Patient states that he was drinking four to six 40oz of beer per day. Patient states that his last drink was on 06/19/13. Patient has a CIWA of 4 due to tremors, nausea and anxiety. The patient was given education regarding unit rules and regulations. Patient was given education regarding fall risk safety (medium). Patient currently denies SI/HI and auditory and visual hallucinations. Patient was escorted to unit by RN and MHT. Will continue to monitor patient for safety.

## 2013-06-20 NOTE — BH Assessment (Signed)
Assessment Note  [name] is a 47 y.o. male who presents to [facility] for detox from alcohol.  Pt denies SI/HI/Psych.  Pt reports drinking 5-6 40's, daily.  Pt.'s last drink was 06/19/13, he drank 6-40's. Pt denies any current w/d sxs, however, pt states that he experiences tremors and sweats when withdrawing.  Pt has past inpt admissions with [facility] in 2012.  Pt states that he wants to stop drinking because of his extensive health problems--COPD and HTN.  Pt denies any issues with seizures/blackouts, no legal issues.     Axis I: Alcohol Dependence  Axis II: Deferred Axis III:  Past Medical History  Diagnosis Date  . Hypertension   . Shortness of breath   . COPD (chronic obstructive pulmonary disease)   . Depression   . Alcohol abuse    Axis IV: other psychosocial or environmental problems, problems related to social environment and problems with primary support group Axis V: 51-60 moderate symptoms  Past Medical History:  Past Medical History  Diagnosis Date  . Hypertension   . Shortness of breath   . COPD (chronic obstructive pulmonary disease)   . Depression   . Alcohol abuse     Past Surgical History  Procedure Laterality Date  . Hemorrhoid surgery    . Mandible fracture surgery    . Back surgery    . Hernia repair      Family History: No family history on file.  Social History:  reports that he has been smoking Cigarettes.  He has been smoking about 2.00 packs per day. He does not have any smokeless tobacco history on file. He reports that he drinks about 6.0 ounces of alcohol per week. He reports that he uses illicit drugs (Cocaine and Marijuana).  Additional Social History:  Alcohol / Drug Use Pain Medications: See MAR  Prescriptions: See MAR  Over the Counter: See MAR  History of alcohol / drug use?: Yes Longest period of sobriety (when/how long): None  Negative Consequences of Use: Personal relationships Withdrawal Symptoms: Other (Comment) (No current w/d sxs  ) Substance #1 Name of Substance 1: Alcohol  1 - Age of First Use: 17 YOM  1 - Amount (size/oz): 6-40's  1 - Frequency: Daily  1 - Duration: On-going  1 - Last Use / Amount: 06/19/13  CIWA: CIWA-Ar BP: 118/73 mmHg Pulse Rate: 99 Nausea and Vomiting: no nausea and no vomiting Tactile Disturbances: none Tremor: no tremor Auditory Disturbances: not present Paroxysmal Sweats: no sweat visible Visual Disturbances: not present Anxiety: no anxiety, at ease Headache, Fullness in Head: none present Agitation: normal activity Orientation and Clouding of Sensorium: oriented and can do serial additions CIWA-Ar Total: 0 COWS:    Allergies: No Known Allergies  Home Medications:  (Not in a hospital admission)  OB/GYN Status:  No LMP for male patient.  General Assessment Data Location of Assessment: [facility] Is this a Tele or Face-to-Face Assessment?: Tele Assessment Is this an Initial Assessment or a Re-assessment for this encounter?: Initial Assessment Living Arrangements: Non-relatives/Friends Can pt return to current living arrangement?: Yes Admission Status: Voluntary Is patient capable of signing voluntary admission?: Yes Transfer from: Acute Hospital Referral Source: MD  Medical Screening Exam [facility] Walk-in ONLY) Medical Exam completed: No Reason for MSE not completed: Other: (None )  [facility] Crisis Care Plan Living Arrangements: Non-relatives/Friends Name of Psychiatrist: None  Name of Therapist: None   Education Status Is patient currently in school?: No Current Grade: None  Highest grade of  school patient has completed: None  Name of school: None  Contact person: None   Risk to self Suicidal Ideation: No Suicidal Intent: No Is patient at risk for suicide?: No Suicidal Plan?: No Access to Means: No What has been your use of drugs/alcohol within the last 12 months?: Abusing: alcohol  Previous Attempts/Gestures: No How many times?: 0 Other Self Harm Risks: None   Triggers for Past Attempts: None known Intentional Self Injurious Behavior: None Family Suicide History: No Recent stressful life event(s): Other (Comment) (Health problems ) Persecutory voices/beliefs?: No Depression: Yes Depression Symptoms: Loss of interest in usual pleasures Substance abuse history and/or treatment for substance abuse?: Yes Suicide prevention information given to non-admitted patients: Not applicable  Risk to Others Homicidal Ideation: No Thoughts of Harm to Others: No Current Homicidal Intent: No Current Homicidal Plan: No Access to Homicidal Means: No Identified Victim: None  History of harm to others?: No Assessment of Violence: None Noted Violent Behavior Description: None  Does patient have access to weapons?: No Criminal Charges Pending?: No Does patient have a court date: No  Psychosis Hallucinations: None noted Delusions: None noted  Mental Status Report Appear/Hygiene: Disheveled Eye Contact: Fair Motor Activity: Unremarkable Speech: Logical/coherent Level of Consciousness: Alert Mood: Sad Affect: Sad Anxiety Level: None Thought Processes: Coherent;Relevant Judgement: Unimpaired Orientation: Person;Place;Time;Situation Obsessive Compulsive Thoughts/Behaviors: None  Cognitive Functioning Concentration: Decreased Memory: Recent Intact;Remote Intact IQ: Average Insight: Fair Impulse Control: Fair Appetite: Fair Weight Loss: 0 Weight Gain: 0 Sleep: No Change Total Hours of Sleep: 6 Vegetative Symptoms: None  ADLScreening [facility]) Patient's cognitive ability adequate to safely complete daily activities?: Yes Patient able to express need for assistance with ADLs?: Yes Independently performs ADLs?: Yes (appropriate for developmental age)  Prior Inpatient Therapy Prior Inpatient Therapy: Yes Prior Therapy Dates: 2012 Prior Therapy Facilty/Provider(s): [facility] Reason for Treatment: Detox/Rehab   Prior Outpatient  Therapy Prior Outpatient Therapy: No Prior Therapy Dates: None  Prior Therapy Facilty/Provider(s): None  Reason for Treatment: None   ADL Screening (condition at time of admission) Patient's cognitive ability adequate to safely complete daily activities?: Yes Is the patient deaf or have difficulty hearing?: No Does the patient have difficulty seeing, even when wearing glasses/contacts?: No Does the patient have difficulty concentrating, remembering, or making decisions?: No Patient able to express need for assistance with ADLs?: Yes Does the patient have difficulty dressing or bathing?: No Independently performs ADLs?: Yes (appropriate for developmental age) Does the patient have difficulty walking or climbing stairs?: No Weakness of Legs: None Weakness of Arms/Hands: None  Home Assistive Devices/Equipment Home Assistive Devices/Equipment: None  Therapy Consults (therapy consults require a physician order) PT Evaluation Needed: No OT Evalulation Needed: No SLP Evaluation Needed: No Abuse/Neglect Assessment (Assessment to be complete while patient is alone) Physical Abuse: Denies Verbal Abuse: Denies Sexual Abuse: Denies Exploitation of patient/patient's resources: Denies Self-Neglect: Denies Values / Beliefs Cultural Requests During Hospitalization: None Spiritual Requests During Hospitalization: None Consults Spiritual Care Consult Needed: No Social Work Consult Needed: No Merchant navy officer (For Healthcare) Advance Directive: Patient does not have advance directive;Patient would not like information Pre-existing out of facility DNR order (yellow form or pink MOST form): No Nutrition Screen- [facility] Adult/[facility] Patient's home diet: Regular  Additional Information 1:1 In Past 12 Months?: No CIRT Risk: No Elopement Risk: No Does patient have medical clearance?: Yes     Disposition:  Disposition Initial Assessment Completed for this Encounter: Yes Disposition of  Patient: Inpatient treatment program;Referred to [facility])  Type of inpatient treatment program: Adult Patient referred to: Other (Comment) [facility] )  On Site Evaluation by:   Reviewed with Physician:    Murrell Redden 06/20/2013 1:33 AM

## 2013-06-20 NOTE — BHH Counselor (Addendum)
This Clinical research associate contacted Dr. Aaron Mose) for clinical, not avail for consult.  This Clinical research associate informed Dr. Silverio Lay with disposition---seeking placement at [facility]. Addt'l BAL requested at 0500am and faxed information to [facility] for review.

## 2013-06-20 NOTE — Consult Note (Signed)
[facility] Face-to-Face Psychiatry Consult   Reason for Consult:  Alcohol add Referring Physician:  ER MD  [name] is an 47 y.o. male.  Assessment: AXIS I:  Alcohol Abuse AXIS II:  Deferred AXIS III:   Past Medical History  Diagnosis Date  . Hypertension   . Shortness of breath   . COPD (chronic obstructive pulmonary disease)   . Depression   . Alcohol abuse    AXIS IV:  economic problems AXIS V:  51-60 moderate symptoms  Plan:  Recommend psychiatric Inpatient admission when medically cleared.  Subjective:   [name] is a 47 y.o. male patient admitted with alcohol abuse.  HPI:  [name] has continued drinking 6-8 forty oz beers daily and says he will need help to stop and he wants to stop again.  He believes he last got help in 2012 but the record indicates he was in detox at [facility] in February 2014.  He does not go to AA saying he just does not like to go to those meetings.  He is depressed but does not take medication.  He is not suicidal and says if he not drinking he is okay. HPI Elements:   Location:  ER. Quality:  daily drinking. Severity:  6-8 forty oz beers daily. Timing:  does not follow up after detox. Duration:  years. Context:  addiction to alcohol.  Past Psychiatric History: Past Medical History  Diagnosis Date  . Hypertension   . Shortness of breath   . COPD (chronic obstructive pulmonary disease)   . Depression   . Alcohol abuse     reports that he has been smoking Cigarettes.  He has been smoking about 2.00 packs per day. He does not have any smokeless tobacco history on file. He reports that he drinks about 6.0 ounces of alcohol per week. He reports that he uses illicit drugs (Cocaine and Marijuana). No family history on file. Family History Substance Abuse: No Family Supports: No Living Arrangements: Non-relatives/Friends Can pt return to current living arrangement?: Yes Abuse/Neglect [facility]) Physical Abuse: Denies Verbal Abuse:  Denies Sexual Abuse: Denies Allergies:  No Known Allergies  ACT Assessment Complete:  Yes:    Educational Status    Risk to Self: Risk to self Suicidal Ideation: No Suicidal Intent: No Is patient at risk for suicide?: No Suicidal Plan?: No Access to Means: No What has been your use of drugs/alcohol within the last 12 months?: Abusing: alcohol  Previous Attempts/Gestures: No How many times?: 0 Other Self Harm Risks: None  Triggers for Past Attempts: None known Intentional Self Injurious Behavior: None Family Suicide History: No Recent stressful life event(s): Other (Comment) (Health problems ) Persecutory voices/beliefs?: No Depression: Yes Depression Symptoms: Loss of interest in usual pleasures Substance abuse history and/or treatment for substance abuse?: Yes Suicide prevention information given to non-admitted patients: Not applicable  Risk to Others: Risk to Others Homicidal Ideation: No Thoughts of Harm to Others: No Current Homicidal Intent: No Current Homicidal Plan: No Access to Homicidal Means: No Identified Victim: None  History of harm to others?: No Assessment of Violence: None Noted Violent Behavior Description: None  Does patient have access to weapons?: No Criminal Charges Pending?: No Does patient have a court date: No  Abuse: Abuse/Neglect Assessment (Assessment to be complete while patient is alone) Physical Abuse: Denies Verbal Abuse: Denies Sexual Abuse: Denies Exploitation of patient/patient's resources: Denies Self-Neglect: Denies  Prior Inpatient Therapy: Prior Inpatient Therapy Prior Inpatient Therapy: Yes Prior Therapy Dates:  2012 Prior Therapy Facilty/Provider(s): [facility] Reason for Treatment: Detox/Rehab   Prior Outpatient Therapy: Prior Outpatient Therapy Prior Outpatient Therapy: No Prior Therapy Dates: None  Prior Therapy Facilty/Provider(s): None  Reason for Treatment: None   Additional Information: Additional Information 1:1 In Past  12 Months?: No CIRT Risk: No Elopement Risk: No Does patient have medical clearance?: Yes                  Objective: Blood pressure 121/75, pulse 73, temperature 97.9 F (36.6 C), temperature source Oral, resp. rate 18, SpO2 96.00%.There is no weight on file to calculate BMI. Results for orders placed during the hospital encounter of 06/19/13 (from the past 72 hour(s))  ACETAMINOPHEN LEVEL     Status: None   Collection Time    06/19/13  7:38 PM      Result Value Range   Acetaminophen (Tylenol), Serum <15.0  10 - 30 ug/mL   Comment:            THERAPEUTIC CONCENTRATIONS VARY     SIGNIFICANTLY. A RANGE OF 10-30     ug/mL MAY BE AN EFFECTIVE     CONCENTRATION FOR MANY PATIENTS.     HOWEVER, SOME ARE BEST TREATED     AT CONCENTRATIONS OUTSIDE THIS     RANGE.     ACETAMINOPHEN CONCENTRATIONS     >150 ug/mL AT 4 HOURS AFTER     INGESTION AND >50 ug/mL AT 12     HOURS AFTER INGESTION ARE     OFTEN ASSOCIATED WITH TOXIC     REACTIONS.  CBC     Status: Abnormal   Collection Time    06/19/13  7:38 PM      Result Value Range   WBC 5.1  4.0 - 10.5 K/uL   RBC 3.69 (*) 4.22 - 5.81 MIL/uL   Hemoglobin 13.2  13.0 - 17.0 g/dL   HCT 16.1 (*) 09.6 - 04.5 %   MCV 98.1  78.0 - 100.0 fL   MCH 35.8 (*) 26.0 - 34.0 pg   MCHC 36.5 (*) 30.0 - 36.0 g/dL   RDW 40.9  81.1 - 91.4 %   Platelets 220  150 - 400 K/uL  COMPREHENSIVE METABOLIC PANEL     Status: Abnormal   Collection Time    06/19/13  7:38 PM      Result Value Range   Sodium 134 (*) 135 - 145 mEq/L   Potassium 3.2 (*) 3.5 - 5.1 mEq/L   Chloride 95 (*) 96 - 112 mEq/L   CO2 28  19 - 32 mEq/L   Glucose, Bld 130 (*) 70 - 99 mg/dL   BUN 7  6 - 23 mg/dL   Creatinine, Ser 7.82  0.50 - 1.35 mg/dL   Calcium 9.2  8.4 - 95.6 mg/dL   Total Protein 7.5  6.0 - 8.3 g/dL   Albumin 4.1  3.5 - 5.2 g/dL   AST 94 (*) 0 - 37 U/L   ALT 52  0 - 53 U/L   Alkaline Phosphatase 64  39 - 117 U/L   Total Bilirubin 0.2 (*) 0.3 - 1.2 mg/dL    GFR calc non Af Amer >90  >90 mL/min   GFR calc Af Amer >90  >90 mL/min   Comment: (NOTE)     The eGFR has been calculated using the CKD EPI equation.     This calculation has not been validated in all clinical situations.     eGFR's persistently <90 mL/min  signify possible Chronic Kidney     Disease.  ETHANOL     Status: Abnormal   Collection Time    06/19/13  7:38 PM      Result Value Range   Alcohol, Ethyl (B) 408 (*) 0 - 11 mg/dL   Comment:            LOWEST DETECTABLE LIMIT FOR     SERUM ALCOHOL IS 11 mg/dL     FOR MEDICAL PURPOSES ONLY     CRITICAL RESULT CALLED TO, READ BACK BY AND VERIFIED WITH:     Lytle Michaels RN [id number] 06/19/13 A NAVARRO  SALICYLATE LEVEL     Status: None   Collection Time    06/19/13  7:38 PM      Result Value Range   Salicylate Lvl 12.3  2.8 - 20.0 mg/dL  URINE RAPID DRUG SCREEN (HOSP PERFORMED)     Status: None   Collection Time    06/19/13  8:25 PM      Result Value Range   Opiates NONE DETECTED  NONE DETECTED   Cocaine NONE DETECTED  NONE DETECTED   Benzodiazepines NONE DETECTED  NONE DETECTED   Amphetamines NONE DETECTED  NONE DETECTED   Tetrahydrocannabinol NONE DETECTED  NONE DETECTED   Barbiturates NONE DETECTED  NONE DETECTED   Comment:            DRUG SCREEN FOR MEDICAL PURPOSES     ONLY.  IF CONFIRMATION IS NEEDED     FOR ANY PURPOSE, NOTIFY LAB     WITHIN 5 DAYS.                LOWEST DETECTABLE LIMITS     FOR URINE DRUG SCREEN     Drug Class       Cutoff (ng/mL)     Amphetamine      1000     Barbiturate      200     Benzodiazepine   200     Tricyclics       300     Opiates          300     Cocaine          300     THC              50  URINALYSIS, ROUTINE W REFLEX MICROSCOPIC     Status: None   Collection Time    06/19/13  8:25 PM      Result Value Range   Color, Urine YELLOW  YELLOW   APPearance CLEAR  CLEAR   Specific Gravity, Urine 1.015  1.005 - 1.030   pH 5.5  5.0 - 8.0   Glucose, UA NEGATIVE  NEGATIVE mg/dL   Hgb  urine dipstick NEGATIVE  NEGATIVE   Bilirubin Urine NEGATIVE  NEGATIVE   Ketones, ur NEGATIVE  NEGATIVE mg/dL   Protein, ur NEGATIVE  NEGATIVE mg/dL   Urobilinogen, UA 0.2  0.0 - 1.0 mg/dL   Nitrite NEGATIVE  NEGATIVE   Leukocytes, UA NEGATIVE  NEGATIVE   Comment: MICROSCOPIC NOT DONE ON URINES WITH NEGATIVE PROTEIN, BLOOD, LEUKOCYTES, NITRITE, OR GLUCOSE <1000 mg/dL.  ETHANOL     Status: Abnormal   Collection Time    06/19/13 10:57 PM      Result Value Range   Alcohol, Ethyl (B) 291 (*) 0 - 11 mg/dL   Comment:            LOWEST DETECTABLE LIMIT FOR  SERUM ALCOHOL IS 11 mg/dL     FOR MEDICAL PURPOSES ONLY  ETHANOL     Status: Abnormal   Collection Time    06/20/13  6:42 AM      Result Value Range   Alcohol, Ethyl (B) 80 (*) 0 - 11 mg/dL   Comment:            LOWEST DETECTABLE LIMIT FOR     SERUM ALCOHOL IS 11 mg/dL     FOR MEDICAL PURPOSES ONLY   Labs are reviewed and are pertinent for alcohol presence.  Current Facility-Administered Medications  Medication Dose Route Frequency Provider Last Rate Last Dose  . LORazepam (ATIVAN) tablet 0-4 mg  0-4 mg Oral Q6H Shon Baton, MD   1 mg at 06/19/13 2056   Followed by  . [START ON 06/21/2013] LORazepam (ATIVAN) tablet 0-4 mg  0-4 mg Oral Q12H Shon Baton, MD       Current Outpatient Prescriptions  Medication Sig Dispense Refill  . albuterol (PROVENTIL HFA;VENTOLIN HFA) 108 (90 BASE) MCG/ACT inhaler Inhale 2 puffs into the lungs every 6 (six) hours as needed for wheezing or shortness of breath.  1 Inhaler  3  . Aspirin-Salicylamide-Caffeine (BC HEADACHE POWDER PO) Take 2 packets by mouth every 6 (six) hours as needed (for pain).      . predniSONE (DELTASONE) 50 MG tablet Take 1 tablet (50 mg total) by mouth daily.  7 tablet  0    Psychiatric Specialty Exam:     Blood pressure 121/75, pulse 73, temperature 97.9 F (36.6 C), temperature source Oral, resp. rate 18, SpO2 96.00%.There is no weight on file to calculate  BMI.  General Appearance: Casual  Eye Contact::  Good  Speech:  Clear and Coherent  Volume:  Normal  Mood:  Depressed  Affect:  Congruent  Thought Process:  Coherent and Logical  Orientation:  Full (Time, Place, and Person)  Thought Content:  Negative  Suicidal Thoughts:  No  Homicidal Thoughts:  No  Memory:  Immediate;   Good Recent;   Good Remote;   Good  Judgement:  Intact  Insight:  Fair  Psychomotor Activity:  Normal  Concentration:  Good  Recall:  Good  Akathisia:  Negative  Handed:  Right  AIMS (if indicated):     Assets:  Communication Skills  Sleep:   good   Treatment Plan Summary: Daily contact with patient to assess and evaluate symptoms and progress in treatment Medication management needs inpatient for detox and initial treatment of depression  TAYLOR,GERALD D 06/20/2013 10:39 AM

## 2013-06-20 NOTE — ED Notes (Signed)
Pt is awake and alert, pleasant and cooperative. Patient denies HI, SI AH or VH. Discharge vitals 142/93 HR 84 RR 16 and unlabored. Pt has inpatient treatment scheduled at [facility]. Will continue to monitor for safety. Patient escorted with pelham without incident. T.Melvyn Neth RN

## 2013-06-20 NOTE — Progress Notes (Signed)
Attempted to contact [facility] [phone]) to inquire about pt. Referral. Left message on Tenika Young's voicemail. -Rodman Pickle, MHT

## 2013-06-20 NOTE — Progress Notes (Signed)
Patient ID: [name], male   DOB: 1965-08-29, 47 y.o.   MRN: [id number] 06-20-13 shift report given to linsey s.rn.

## 2013-06-20 NOTE — BHH Group Notes (Signed)
[facility] LCSW Group Therapy  06/20/2013 2:04 PM  Type of Therapy:  Group Therapy  Participation Level:  Did Not Attend  Merritt, Danny Pieroni 06/20/2013, 2:04 PM

## 2013-06-20 NOTE — Tx Team (Signed)
Initial Interdisciplinary Treatment Plan  PATIENT STRENGTHS: (choose at least two) Active sense of humor Communication skills Motivation for treatment/growth  PATIENT STRESSORS: Educational concerns Financial difficulties Health problems Medication change or noncompliance Occupational concerns Substance abuse   PROBLEM LIST: Problem List/Patient Goals Date to be addressed Date deferred Reason deferred Estimated date of resolution  ETOH Dependence 06/20/13                                                      DISCHARGE CRITERIA:  Ability to meet basic life and health needs Adequate post-discharge living arrangements Improved stabilization in mood, thinking, and/or behavior Medical problems require only outpatient monitoring  PRELIMINARY DISCHARGE PLAN: Attend aftercare/continuing care group Outpatient therapy  PATIENT/FAMIILY INVOLVEMENT: This treatment plan has been presented to and reviewed with the patient, [name].  The patient has been given the opportunity to ask questions and make suggestions.  [name], [name] 06/20/2013, 1:01 PM

## 2013-06-20 NOTE — Progress Notes (Signed)
Patient in his room during this assessment. He reports  Feeling tired. Mood and affect flat and depressed. He said he's been in bed all day and have not been attending groups. Denies SI/HI and denies Hallucinations. Writer encouraged and supported patient. Q 15 minute check continues as ordered to maintains safety.

## 2013-06-20 NOTE — BHH Group Notes (Signed)
Adult Psychoeducational Group Note  Date:  06/20/2013 Time:  11:58 PM  Group Topic/Focus:  AA Meeting  Participation Level:  Did Not Attend  Participation Quality:  None  Affect:  None  Cognitive:  None  Insight: None  Engagement in Group:  None  Modes of Intervention:  Discussion and Education  Additional Comments:  Takota did not attend group.  Caroll Rancher A 06/20/2013, 11:58 PM

## 2013-06-21 DIAGNOSIS — F329 Major depressive disorder, single episode, unspecified: Secondary | ICD-10-CM

## 2013-06-21 DIAGNOSIS — F3289 Other specified depressive episodes: Secondary | ICD-10-CM

## 2013-06-21 DIAGNOSIS — F1910 Other psychoactive substance abuse, uncomplicated: Secondary | ICD-10-CM

## 2013-06-21 MED ORDER — CHLORDIAZEPOXIDE HCL 25 MG PO CAPS
50.0000 mg | ORAL_CAPSULE | Freq: Once | ORAL | Status: AC
  Administered 2013-06-21: 50 mg via ORAL

## 2013-06-21 MED ORDER — CHLORDIAZEPOXIDE HCL 25 MG PO CAPS
50.0000 mg | ORAL_CAPSULE | Freq: Four times a day (QID) | ORAL | Status: DC | PRN
  Administered 2013-06-22 (×2): 50 mg via ORAL
  Filled 2013-06-21 (×3): qty 2

## 2013-06-21 NOTE — Progress Notes (Signed)
Patient ID: [name], male   DOB: [birth year], 47 y.o.   MRN: [id number] D. Patient presents with depressed mood, affect congruent. Patient continues to endorse various withdrawal symptoms, noted tremors anxiety and pt reported nausea. . Pt completed self inventory and reported mood as ''good'' Pt denies any SI/HI and requests follow up at [facility]. A. Medication given as ordered. Pt remains on CIWA protocol. Support and encouragement provided. R.Pt has been in bed throughout most of shift except at meal times ,  Will continue to monitor q 15 minutes for safety.

## 2013-06-21 NOTE — BHH Group Notes (Signed)
BHH Group Notes:  (Clinical Social Work)  06/21/2013     10-11AM  Summary of Progress/Problems:   The main focus of today's process group was for the patient to identify ways in which they have in the past sabotaged their own recovery. Motivational Interviewing was utilized to ask the group members what they get out of their substance use, and what reasons they may have for wanting to change.  The Stages of Change were explained using a handout, and patients identified where they currently are with regard to stages of change.  The patient expressed that he loves to drink, and that is why he drinks.  With motivational interviewing, he ultimately revealed that he is never happy unless he is drinking.  This is affecting his "insides" and also compromises his COPD, as he always smokes cigarettes when he drinks alcohol.  He seemed to feel some pride in stating that he no longer drinks liquor, thought, only [employer].  Type of Therapy:  Group Therapy - Process   Participation Level:  Active  Participation Quality:  Attentive  Affect:  Flat  Cognitive:  Oriented  Insight:  Developing/Improving  Engagement in Therapy:  Engaged  Modes of Intervention:  Education, Support and Processing, Motivational Interviewing  Ambrose Mantle, LCSW 06/21/2013, 1:47 PM

## 2013-06-21 NOTE — BHH Counselor (Signed)
Adult Psychosocial Assessment Update Interdisciplinary Team  Previous [facility] admissions/discharges:  Admissions Discharges  Date:  09/23/12 Date: 09/26/12  Date: Date:  Date: Date:  Date: Date:  Date: Date:   Changes since the last Psychosocial Assessment (including adherence to outpatient mental health and/or substance abuse treatment, situational issues contributing to decompensation and/or relapse). Patient reports he discharge to [facility] in February and completed program there remain-  Ing sober for 6-7 months.  He then experienced difficulty getting work, was panhandling,   Hanging out with the wrong crowd and began using alcohol once again. Pt reports he  Is drinking 6-7 40 ounce beers daily for last 1.5 month.  Patient reports desire to get   Clean again and would like to go to [facility] if possible     Discharge Plan Pqa1. Will you be returning to the same living situation after discharge?   Yes: X No:      If no, what is your plan?    Patient living with a good friend and can return there if sober       2. Would you like a referral for services when you are discharged? Yes: X    If yes, for what services?  No:       Would like to go to [facility] again; was there February 2014       Summary and Recommendations (to be completed by the evaluator) Patient is 47 YO single unemployed [place] American male admitted with diagnosis of   Alcohol Abuse.  Patient reports relapse of 1.5 months with quick return to daily drinking.  Patient would benefit from crisis stabilization, medication evaluation, therapy groups for   processing thoughts/feelings/experiences, psycho ed groups for increasing coping   skills, and aftercare planning               Signature:  Clide Dales, 06/21/2013 5:00 PM

## 2013-06-21 NOTE — BHH Group Notes (Signed)
[facility] Group Notes:  (Nursing/MHT/Case Management/Adjunct)  Date:  06/21/2013  Time:  10:58 AM  Type of Therapy:  Psychoeducational Skills  Participation Level:  Active  Participation Quality:  Drowsy and Inattentive  Affect:  Anxious  Cognitive:  Lacking  Insight:  Lacking  Engagement in Group:  Engaged  Modes of Intervention:  Discussion, Education and Exploration  Summary of Progress/Problems: self inventory review with RN and healthy coping skills  [name] 06/21/2013, 10:58 AM

## 2013-06-21 NOTE — Progress Notes (Signed)
Patient ID: [name], male   DOB: 02-22-1966, 47 y.o.   MRN: [id number] Pt with episode of nausea and vomiting stating '' i took a really long hot shower and I felt really thirsty when I got out and I got my water and then it made me really feel sick '' Patient observed to be sweaty and tremulous. Discussed above information with Molli Knock NP and orders received. Pt provided gatorade, medications given as ordered and assisted to bed. Pt re-educated to notify staff if withdrawal symptoms persist / worsen. Pt verbalized understanding currently resting quietly in bed. Will continue to monitor q 15 minutes for safety.

## 2013-06-21 NOTE — Progress Notes (Signed)
Pt did not attend afternoon group. Pt was not feeling well.  

## 2013-06-21 NOTE — H&P (Addendum)
Psychiatric Admission Assessment Adult  Patient Identification:  [name] Date of Evaluation:  06/21/2013 Chief Complaint:  alcohol dependence History of Present Illness:: Assessment Note   [name] is a 47 y.o. male who presents to [facility] for detox from alcohol. Has had prior treatment with [facility]. Was not able to cut down on his alcohol. Denies psychotic symptoms but says started feeling tremulous and needed help. Denies manic symptoms. Showed poor insight.  Pt denies SI/HI/Psych. Pt reports drinking 5-6 40's, daily. Pt.'s last drink was 06/19/13, he drank 6-40's. Pt denies any current w/d sxs, however, pt states that he experiences tremors and sweats when withdrawing. Pt has past inpt admissions with [facility] in 2012. Pt states that he wants to stop drinking because of his extensive health problems--COPD and HTN. Pt denies any issues with seizures/blackouts, no legal issues.   Elements:  Location:  hospital. Quality:  moderate. Severity:  moderate. Associated Signs/Synptoms: Depression Symptoms:  depressed mood, anhedonia, hopelessness, anxiety, (Hypo) Manic Symptoms:  Distractibility, Impulsivity, Anxiety Symptoms:  Excessive Worry, Social Anxiety, Psychotic Symptoms:  Paranoia, PTSD Symptoms: NA  Psychiatric Specialty Exam: Physical Exam  Constitutional: He is oriented to person, place, and time.  HENT:  Head: Normocephalic and atraumatic.  Neurological: He is alert and oriented to person, place, and time.  Skin: He is diaphoretic.  Psychiatric: He has a normal mood and affect.    ROS  Blood pressure 142/101, pulse 106, temperature 98 F (36.7 C), temperature source Oral, resp. rate 18, height 5\' 7"  (1.702 m), weight 61.689 kg (136 lb).Body mass index is 21.3 kg/(m^2).  General Appearance: Casual and Disheveled  Eye Contact::  Fair  Speech:  Slow  Volume:  Decreased  Mood:  Dysphoric  Affect:  Constricted  Thought Process:  Linear  Orientation:  Full (Time,  Place, and Person)  Thought Content:  Rumination  Suicidal Thoughts:  No  Homicidal Thoughts:  No  Memory:  Recent;   Poor  Judgement:  Impaired  Insight:  Lacking  Psychomotor Activity:  Restlessness  Concentration:  Fair  Recall:  Fair  Akathisia:  No  Handed:  Right  AIMS (if indicated):     Assets:  Leisure Time Resilience Transportation  Sleep:  Number of Hours: 6.75    Past Psychiatric History: Diagnosis: Alcohol use disorder  Hospitalizations: yes  Outpatient Care:irregular  Substance Abuse Care: Alcohol detox  Self-Mutilation: denies  Suicidal Attempts: denies  Violent Behaviors: vague history   Past Medical History:   Past Medical History  Diagnosis Date  . Hypertension   . Shortness of breath   . COPD (chronic obstructive pulmonary disease)   . Depression   . Alcohol abuse    None. Allergies:  No Known Allergies PTA Medications: Prescriptions prior to admission  Medication Sig Dispense Refill  . albuterol (PROVENTIL HFA;VENTOLIN HFA) 108 (90 BASE) MCG/ACT inhaler Inhale 2 puffs into the lungs every 6 (six) hours as needed for wheezing or shortness of breath.      . Aspirin-Salicylamide-Caffeine (BC HEADACHE POWDER PO) Take 2 packets by mouth every 6 (six) hours as needed (for pain).      . predniSONE (DELTASONE) 50 MG tablet Take 50 mg by mouth daily.      . [DISCONTINUED] albuterol (PROVENTIL HFA;VENTOLIN HFA) 108 (90 BASE) MCG/ACT inhaler Inhale 2 puffs into the lungs every 6 (six) hours as needed for wheezing or shortness of breath.  1 Inhaler  3  . [DISCONTINUED] predniSONE (DELTASONE) 50 MG tablet Take 1 tablet (  50 mg total) by mouth daily.  7 tablet  0    Previous Psychotropic Medications:  Medication/Dose                 Substance Abuse History in the last 12 months:  yes  Consequences of Substance Abuse: Legal Consequences:  related to alcohol Family Consequences:  relationship  Social History:  reports that he has been smoking  Cigarettes.  He has been smoking about 2.00 packs per day. He does not have any smokeless tobacco history on file. He reports that he drinks about 6.0 ounces of alcohol per week. He reports that he uses illicit drugs (Cocaine and Marijuana). Additional Social History:                      Current Place of Residence:   Place of Birth:   Family Members: Marital Status:  Separated Children:  Sons:  Daughters: Relationships: Education:  [employer] Problems/Performance: Religious Beliefs/Practices: History of Abuse (Emotional/Phsycial/Sexual) Teacher, music History:  None. Legal History: Hobbies/Interests:  Family History:  History reviewed. No pertinent family history.  Results for orders placed during the hospital encounter of 06/19/13 (from the past 72 hour(s))  ACETAMINOPHEN LEVEL     Status: None   Collection Time    06/19/13  7:38 PM      Result Value Range   Acetaminophen (Tylenol), Serum <15.0  10 - 30 ug/mL   Comment:            THERAPEUTIC CONCENTRATIONS VARY     SIGNIFICANTLY. A RANGE OF 10-30     ug/mL MAY BE AN EFFECTIVE     CONCENTRATION FOR MANY PATIENTS.     HOWEVER, SOME ARE BEST TREATED     AT CONCENTRATIONS OUTSIDE THIS     RANGE.     ACETAMINOPHEN CONCENTRATIONS     >150 ug/mL AT 4 HOURS AFTER     INGESTION AND >50 ug/mL AT 12     HOURS AFTER INGESTION ARE     OFTEN ASSOCIATED WITH TOXIC     REACTIONS.  CBC     Status: Abnormal   Collection Time    06/19/13  7:38 PM      Result Value Range   WBC 5.1  4.0 - 10.5 K/uL   RBC 3.69 (*) 4.22 - 5.81 MIL/uL   Hemoglobin 13.2  13.0 - 17.0 g/dL   HCT 16.1 (*) 09.6 - 04.5 %   MCV 98.1  78.0 - 100.0 fL   MCH 35.8 (*) 26.0 - 34.0 pg   MCHC 36.5 (*) 30.0 - 36.0 g/dL   RDW 40.9  81.1 - 91.4 %   Platelets 220  150 - 400 K/uL  COMPREHENSIVE METABOLIC PANEL     Status: Abnormal   Collection Time    06/19/13  7:38 PM      Result Value Range   Sodium 134 (*) 135 - 145  mEq/L   Potassium 3.2 (*) 3.5 - 5.1 mEq/L   Chloride 95 (*) 96 - 112 mEq/L   CO2 28  19 - 32 mEq/L   Glucose, Bld 130 (*) 70 - 99 mg/dL   BUN 7  6 - 23 mg/dL   Creatinine, Ser 7.82  0.50 - 1.35 mg/dL   Calcium 9.2  8.4 - 95.6 mg/dL   Total Protein 7.5  6.0 - 8.3 g/dL   Albumin 4.1  3.5 - 5.2 g/dL   AST 94 (*) 0 - 37 U/L  ALT 52  0 - 53 U/L   Alkaline Phosphatase 64  39 - 117 U/L   Total Bilirubin 0.2 (*) 0.3 - 1.2 mg/dL   GFR calc non Af Amer >90  >90 mL/min   GFR calc Af Amer >90  >90 mL/min   Comment: (NOTE)     The eGFR has been calculated using the CKD EPI equation.     This calculation has not been validated in all clinical situations.     eGFR's persistently <90 mL/min signify possible Chronic Kidney     Disease.  ETHANOL     Status: Abnormal   Collection Time    06/19/13  7:38 PM      Result Value Range   Alcohol, Ethyl (B) 408 (*) 0 - 11 mg/dL   Comment:            LOWEST DETECTABLE LIMIT FOR     SERUM ALCOHOL IS 11 mg/dL     FOR MEDICAL PURPOSES ONLY     CRITICAL RESULT CALLED TO, READ BACK BY AND VERIFIED WITH:     Lytle Michaels RN 2115 06/19/13 A NAVARRO  SALICYLATE LEVEL     Status: None   Collection Time    06/19/13  7:38 PM      Result Value Range   Salicylate Lvl 12.3  2.8 - 20.0 mg/dL  URINE RAPID DRUG SCREEN (HOSP PERFORMED)     Status: None   Collection Time    06/19/13  8:25 PM      Result Value Range   Opiates NONE DETECTED  NONE DETECTED   Cocaine NONE DETECTED  NONE DETECTED   Benzodiazepines NONE DETECTED  NONE DETECTED   Amphetamines NONE DETECTED  NONE DETECTED   Tetrahydrocannabinol NONE DETECTED  NONE DETECTED   Barbiturates NONE DETECTED  NONE DETECTED   Comment:            DRUG SCREEN FOR MEDICAL PURPOSES     ONLY.  IF CONFIRMATION IS NEEDED     FOR ANY PURPOSE, NOTIFY LAB     WITHIN 5 DAYS.                LOWEST DETECTABLE LIMITS     FOR URINE DRUG SCREEN     Drug Class       Cutoff (ng/mL)     Amphetamine      1000      Barbiturate      200     Benzodiazepine   200     Tricyclics       300     Opiates          300     Cocaine          300     THC              50  URINALYSIS, ROUTINE W REFLEX MICROSCOPIC     Status: None   Collection Time    06/19/13  8:25 PM      Result Value Range   Color, Urine YELLOW  YELLOW   APPearance CLEAR  CLEAR   Specific Gravity, Urine 1.015  1.005 - 1.030   pH 5.5  5.0 - 8.0   Glucose, UA NEGATIVE  NEGATIVE mg/dL   Hgb urine dipstick NEGATIVE  NEGATIVE   Bilirubin Urine NEGATIVE  NEGATIVE   Ketones, ur NEGATIVE  NEGATIVE mg/dL   Protein, ur NEGATIVE  NEGATIVE mg/dL   Urobilinogen, UA 0.2  0.0 - 1.0  mg/dL   Nitrite NEGATIVE  NEGATIVE   Leukocytes, UA NEGATIVE  NEGATIVE   Comment: MICROSCOPIC NOT DONE ON URINES WITH NEGATIVE PROTEIN, BLOOD, LEUKOCYTES, NITRITE, OR GLUCOSE <1000 mg/dL.  ETHANOL     Status: Abnormal   Collection Time    06/19/13 10:57 PM      Result Value Range   Alcohol, Ethyl (B) 291 (*) 0 - 11 mg/dL   Comment:            LOWEST DETECTABLE LIMIT FOR     SERUM ALCOHOL IS 11 mg/dL     FOR MEDICAL PURPOSES ONLY  ETHANOL     Status: Abnormal   Collection Time    06/20/13  6:42 AM      Result Value Range   Alcohol, Ethyl (B) 80 (*) 0 - 11 mg/dL   Comment:            LOWEST DETECTABLE LIMIT FOR     SERUM ALCOHOL IS 11 mg/dL     FOR MEDICAL PURPOSES ONLY   Psychological Evaluations:  Assessment:   DSM5:  Schizophrenia Disorders:  Obsessive-Compulsive Disorders:   Trauma-Stressor Disorders:   Substance/Addictive Disorders:  Alcohol Related Disorder - Severe (303.90) Depressive Disorders:  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 Hypertension   . Shortness of breath   . COPD (chronic obstructive pulmonary disease)   . Depression   . Alcohol abuse     AXIS IV:  occupational problems and other psychosocial or environmental problems AXIS V:  41-50 serious symptoms  Treatment Plan/Recommendations:  Inpatient stabilization and Detox  Treatment Plan Summary: Daily contact with patient to assess and evaluate symptoms and progress in treatment Medication management Support groups. Monitor for depression and withdrawal symptoms of alcohol. Already started Alcohol detox protocol. Current Medications:  Current Facility-Administered Medications  Medication Dose Route Frequency Provider Last Rate Last Dose  . acetaminophen (TYLENOL) tablet 650 mg  650 mg Oral Q6H PRN Benjaman Pott, MD      . alum & mag hydroxide-simeth (MAALOX/MYLANTA) 200-200-20 MG/5ML suspension 30 mL  30 mL Oral Q4H PRN Benjaman Pott, MD      . chlordiazePOXIDE (LIBRIUM) capsule 25 mg  25 mg Oral Q6H PRN Earney Navy, NP   25 mg at 06/21/13 0826  . chlordiazePOXIDE (LIBRIUM) capsule 25 mg  25 mg Oral QID Rachael Fee, MD   25 mg at 06/21/13 0825   Followed by  . [START ON 06/22/2013] chlordiazePOXIDE (LIBRIUM) capsule 25 mg  25 mg Oral TID Rachael Fee, MD       Followed by  . [START ON 06/23/2013] chlordiazePOXIDE (LIBRIUM) capsule 25 mg  25 mg Oral BH-qamhs Rachael Fee, MD       Followed by  . [START ON 06/24/2013] chlordiazePOXIDE (LIBRIUM) capsule 25 mg  25 mg Oral Daily Rachael Fee, MD      . hydrOXYzine (ATARAX/VISTARIL) tablet 25 mg  25 mg Oral Q6H PRN Earney Navy, NP   25 mg at 06/20/13 2144  . loperamide (IMODIUM) capsule 2-4 mg  2-4 mg Oral PRN Earney Navy, NP      . magnesium hydroxide (MILK OF MAGNESIA) suspension 30 mL  30 mL Oral Daily PRN Benjaman Pott, MD      . multivitamin with minerals  tablet 1 tablet  1 tablet Oral Daily Earney Navy, NP   1 tablet at 06/21/13 0826  . ondansetron (ZOFRAN-ODT) disintegrating tablet 4 mg  4 mg Oral Q6H PRN Earney Navy, NP   4 mg at 06/21/13 0826  . thiamine (B-1) injection 100 mg   100 mg Intramuscular Once Earney Navy, NP      . thiamine (VITAMIN B-1) tablet 100 mg  100 mg Oral Daily Earney Navy, NP   100 mg at 06/21/13 1610    Observation Level/Precautions:  Detox 15 minute checks  Laboratory:  Chemistry Profile  Psychotherapy:  Support groups  Medications:  Librium for detox  Consultations:  As per needed  Discharge Concerns:  Long term rehab or support groups  Estimated LOS: 5 days  Other:     I certify that inpatient services furnished can reasonably be expected to improve the patient's condition.   Shantana Christon 11/1/201410:27 AM

## 2013-06-21 NOTE — BHH Suicide Risk Assessment (Signed)
Suicide Risk Assessment  Admission Assessment     Nursing information obtained from:    Demographic factors:    Current Mental Status:    Loss Factors:    Historical Factors:    Risk Reduction Factors:     CLINICAL FACTORS:   Dysthymia Alcohol/Substance Abuse/Dependencies  COGNITIVE FEATURES THAT CONTRIBUTE TO RISK:  Closed-mindedness Polarized thinking    SUICIDE RISK:   Mild:  Suicidal ideation of limited frequency, intensity, duration, and specificity.  There are no identifiable plans, no associated intent, mild dysphoria and related symptoms, good self-control (both objective and subjective assessment), few other risk factors, and identifiable protective factors, including available and accessible social support.  PLAN OF CARE:  I certify that inpatient services furnished can reasonably be expected to improve the patient's condition.  Nadra Hritz 06/21/2013, 10:40 AM

## 2013-06-21 NOTE — Progress Notes (Signed)
D: Pt. In room asleep. When writer woke up pt, Pt. Denied SI/HI/AVH. Pt. Stated he is just tired and feels like sleeping. A: q 15 min safety checks. Scheduled meds given. R: pt. Remains safe on unit

## 2013-06-22 LAB — BASIC METABOLIC PANEL
BUN: 9 mg/dL (ref 6–23)
CO2: 30 mEq/L (ref 19–32)
Chloride: 100 mEq/L (ref 96–112)
Creatinine, Ser: 0.84 mg/dL (ref 0.50–1.35)
Sodium: 139 mEq/L (ref 135–145)

## 2013-06-22 MED ORDER — ALBUTEROL SULFATE HFA 108 (90 BASE) MCG/ACT IN AERS
2.0000 | INHALATION_SPRAY | Freq: Four times a day (QID) | RESPIRATORY_TRACT | Status: DC | PRN
  Administered 2013-06-23: 2 via RESPIRATORY_TRACT
  Filled 2013-06-22: qty 6.7

## 2013-06-22 MED ORDER — ONDANSETRON 4 MG PO TBDP
4.0000 mg | ORAL_TABLET | Freq: Once | ORAL | Status: AC
  Administered 2013-06-22: 4 mg via ORAL

## 2013-06-22 NOTE — Progress Notes (Signed)
Psychoeducational Group Note  Date:  06/22/2013 Time:  2000  Group Topic/Focus:  Wrap-Up Group:   The focus of this group is to help patients review their daily goal of treatment and discuss progress on daily workbooks.  Participation Level: Did Not Attend  Participation Quality:  Not Applicable  Affect:  Not Applicable  Cognitive:  Not Applicable  Insight:  Not Applicable  Engagement in Group: Not Applicable  Additional Comments:  The patient did not attend group since he wasn't feeling well   Tanis Burnley S 06/22/2013, 5:11 AM

## 2013-06-22 NOTE — Progress Notes (Signed)
Patient did not attend the evening speaker AA meeting. Pt remained in bed with severe withdrawal symptoms.

## 2013-06-22 NOTE — [facility] Group Notes (Signed)
[facility] Group Notes:  (Clinical Social Work)  06/22/2013  10:00-11:00AM  Summary of Progress/Problems:   The main focus of today's process group was to   identify the patient's current support system and decide on other supports that can be put in place.  The picture on workbook was used to discuss why additional supports are needed, and a hand-out was distributed with four definitions/levels of support, then used to talk about how patients have given and received all different kinds of support.  An emphasis was placed on using counselor, doctor, therapy groups, 12-step groups, and problem-specific support groups to expand supports.  The patient identified his supports as being his family and his wife.  He listened carefully during the group, and contributed when called on.  Is known by the group for being quiet.  Toward the end of group, he took a sip of water, and start coughing then vomited.    Type of Therapy:  Process Group with Motivational Interviewing  Participation Level:  Active  Participation Quality:  Attentive  Affect:  Blunted  Cognitive:  Appropriate  Insight:  Developing/Improving  Engagement in Therapy:  Developing/Improving  Modes of Intervention:   Education, Support and Processing, Activity  Ambrose Mantle, LCSW 06/22/2013, 2:48 PM

## 2013-06-22 NOTE — Progress Notes (Signed)
[facility] MD Progress Note  06/22/2013 3:26 PM [name]  MRN:  [id number] Subjective:   Patient states "I'm starting to have the withdrawal symptoms and I don't feel that great."   Objective:  Patient observed on first approach vomiting in a trash can in the hallway. He appears flat and depressed. Patient receiving prn medications to manage his withdrawal symptoms. Patient is cooperative with care that is being given to him. He admits to drinking a great deal of alcohol prior to admission. Patient received a dose of zofran this morning. Nursing staff reports that the patient also vomited yesterday.   Diagnosis:   DSM5: Schizophrenia Disorders:   Obsessive-Compulsive Disorders:   Trauma-Stressor Disorders:   Substance/Addictive Disorders:  Alcohol Related Disorder - Severe (303.90) Depressive Disorders:  Disruptive Mood Dysregulation Disorder (296.99) AXIS I: Depressive Disorder NOS, Substance Abuse, Substance Induced Mood Disorder and Alcohol use disorder severe  AXIS II: Deferred  AXIS III:  Past Medical History   Diagnosis  Date   .  Hypertension    .  Shortness of breath    .  COPD (chronic obstructive pulmonary disease)    .  Depression    .  Alcohol abuse     AXIS IV: occupational problems and other psychosocial or environmental problems  AXIS V: 41-50 serious symptoms  ADL's:  Intact  Sleep: Fair  Appetite:  Poor  Suicidal Ideation:  Denies  Homicidal Ideation:  Denies AEB (as evidenced by):  Psychiatric Specialty Exam: Review of Systems  Constitutional: Positive for malaise/fatigue.  Eyes: Negative.   Respiratory: Negative.   Cardiovascular: Negative.   Gastrointestinal: Positive for nausea and vomiting.  Genitourinary: Negative.   Skin: Negative.   Neurological: Positive for weakness.  Endo/Heme/Allergies: Negative.   Psychiatric/Behavioral: Positive for depression and substance abuse. Negative for hallucinations. The patient is nervous/anxious.      Blood pressure 109/76, pulse 79, temperature 97.7 F (36.5 C), temperature source Oral, resp. rate 16, height 5\' 7"  (1.702 m), weight 61.689 kg (136 lb).Body mass index is 21.3 kg/(m^2).  General Appearance: Disheveled  Eye Solicitor::  Fair  Speech:  Clear and Coherent  Volume:  Normal  Mood:  Depressed  Affect:  Flat  Thought Process:  Goal Directed  Orientation:  Full (Time, Place, and Person)  Thought Content:  WDL  Suicidal Thoughts:  No  Homicidal Thoughts:  No  Memory:  Immediate;   Fair Recent;   Fair Remote;   Fair  Judgement:  Impaired  Insight:  Shallow  Psychomotor Activity:  Decreased  Concentration:  Fair  Recall:  Fair  Akathisia:  No  Handed:  Right  AIMS (if indicated):     Assets:  Desire for Improvement Leisure Time Resilience  Sleep:  Number of Hours: 7.5   Current Medications: Current Facility-Administered Medications  Medication Dose Route Frequency Provider Last Rate Last Dose  . acetaminophen (TYLENOL) tablet 650 mg  650 mg Oral Q6H PRN Benjaman Pott, MD      . alum & mag hydroxide-simeth (MAALOX/MYLANTA) 200-200-20 MG/5ML suspension 30 mL  30 mL Oral Q4H PRN Benjaman Pott, MD      . chlordiazePOXIDE (LIBRIUM) capsule 25 mg  25 mg Oral TID Rachael Fee, MD   25 mg at 06/22/13 1217   Followed by  . [START ON 06/23/2013] chlordiazePOXIDE (LIBRIUM) capsule 25 mg  25 mg Oral BH-qamhs Rachael Fee, MD       Followed by  . [START ON 06/24/2013] chlordiazePOXIDE (LIBRIUM) capsule  25 mg  25 mg Oral Daily Rachael Fee, MD      . chlordiazePOXIDE (LIBRIUM) capsule 50 mg  50 mg Oral Q6H PRN Nanine Means, NP   50 mg at 06/22/13 1115  . hydrOXYzine (ATARAX/VISTARIL) tablet 25 mg  25 mg Oral Q6H PRN Earney Navy, NP   25 mg at 06/22/13 0804  . loperamide (IMODIUM) capsule 2-4 mg  2-4 mg Oral PRN Earney Navy, NP      . magnesium hydroxide (MILK OF MAGNESIA) suspension 30 mL  30 mL Oral Daily PRN Benjaman Pott, MD      . multivitamin with  minerals tablet 1 tablet  1 tablet Oral Daily Earney Navy, NP   1 tablet at 06/22/13 0804  . ondansetron (ZOFRAN-ODT) disintegrating tablet 4 mg  4 mg Oral Q6H PRN Earney Navy, NP   4 mg at 06/22/13 0804  . ondansetron (ZOFRAN-ODT) disintegrating tablet 4 mg  4 mg Oral Once Fransisca Kaufmann, NP      . thiamine (B-1) injection 100 mg  100 mg Intramuscular Once Earney Navy, NP      . thiamine (VITAMIN B-1) tablet 100 mg  100 mg Oral Daily Earney Navy, NP   100 mg at 06/22/13 3086    Lab Results: No results found for this or any previous visit (from the past 48 hour(s)).  Physical Findings: AIMS: Facial and Oral Movements Muscles of Facial Expression: None, normal Lips and Perioral Area: None, normal Jaw: None, normal Tongue: None, normal,Extremity Movements Upper (arms, wrists, hands, fingers): None, normal Lower (legs, knees, ankles, toes): None, normal, Trunk Movements Neck, shoulders, hips: None, normal, Overall Severity Severity of abnormal movements (highest score from questions above): None, normal Incapacitation due to abnormal movements: None, normal Patient's awareness of abnormal movements (rate only patient's report): No Awareness, Dental Status Current problems with teeth and/or dentures?: No Does patient usually wear dentures?: No  CIWA:  CIWA-Ar Total: 12 COWS:     Treatment Plan Summary: Daily contact with patient to assess and evaluate symptoms and progress in treatment Medication management  Plan: Continue crisis management and stabilization.  Medication management: Continue librium detox protocol. Patient has order for librium 50 mg every six hours prn due to increased withdrawal symptoms. The patient has order for zofran prn nausea.  Encouraged patient to attend groups and participate in group counseling sessions and activities as able.  Discharge plan in progress.  Continue current treatment plan.  Address health issues: Vitals reviewed and  stable. Gatorade prn being given by nursing staff. Repeat basic metabolic panel this evening to evaluate electrolyte status.   Medical Decision Making Problem Points:  Established problem, stable/improving (1) and Review of psycho-social stressors (1) Data Points:  Review of medication regiment & side effects (2)  I certify that inpatient services furnished can reasonably be expected to improve the patient's condition.   DAVIS, LAURA NP-C 06/22/2013, 3:26 PM I agreed with the findings, treatment and disposition plan of this patient. Kathryne Sharper, MD

## 2013-06-22 NOTE — Progress Notes (Signed)
Pt. Heard vomiting in room. Pt. Reports being having n/v and anxious. Medications given, along with Gatorade and ginger ale. Pt. Told to stay in bed and medication would be brought to him. Medication rovers not working and scanner does not reach pt. Bed. Pt. Unable to be scanned, medications put in manually ( Zofran, librium, and vistaril)

## 2013-06-22 NOTE — Progress Notes (Signed)
Patient ID: [name], male   DOB: September 14, 1965, 47 y.o.   MRN: [id number] D. Patient presents with depressed mood, affect flat. Patient continues to report various withdrawal symptoms, however reports improvement stating '' I think I'm getting better. The shakes aren't as bad today. '' Medications given as ordered, multiple prn medications given in am. Patient later had episode of vomiting in dayroom at 1045 . Pt states '' I just got all sick so quick all of a sudden. It just sort of hit me '' Re educated patient to alert staff if withdrawal symptoms worsen. Gatorade given, vitals assessed and notified provider. Orders received. A. Medications given as ordered. Support and encouragement provided. R. Pt currently resting quietly. Will continue to monitor q 15 minutes for safety.

## 2013-06-22 NOTE — [facility] Group Notes (Signed)
[facility] Group Notes:  (Nursing/MHT/Case Management/Adjunct) Date: 06/21/2013  Time: 0900 AM  Type of Therapy: Psychoeducational Skills  Participation Level: Active  Participation Quality: Drowsy and Inattentive  Affect: Anxious  Cognitive: Lacking  Insight: Lacking  Engagement in Group: Engaged  Modes of Intervention: Discussion, Education and Exploration  Summary of Progress/Problems: self inventory review with RN and healthy support system    Malva Limes 06/22/2013, 4:24 PM

## 2013-06-23 DIAGNOSIS — F1994 Other psychoactive substance use, unspecified with psychoactive substance-induced mood disorder: Secondary | ICD-10-CM

## 2013-06-23 NOTE — Progress Notes (Signed)
Adult Psychoeducational Group Note  Date:  06/23/2013 Time:  4:27 PM  Group Topic/Focus:  Self Care:   The focus of this group is to help patients understand the importance of self-care in order to improve or restore emotional, physical, spiritual, interpersonal, and financial health.  Participation Level:  Minimal  Participation Quality:  Resistant  Affect:  Appropriate  Cognitive:  Appropriate  Insight: Appropriate  Engagement in Group:  Engaged  Modes of Intervention:  Discussion, Socialization and Support  Additional Comments:  Pt only spoke when this Clinical research associate addressed him. Pt stated he wanted to improve his self care by going fishing more when he has the opportunity  [name] 06/23/2013, 4:27 PM

## 2013-06-23 NOTE — Progress Notes (Signed)
D.  Pt. Denies SI/HI.  Attended group and reports that he is feeling better.  Reported shortness of breath. A.  PRN Albuterol given. R.  Pt. Reports that the Albuterol was effective.

## 2013-06-23 NOTE — Progress Notes (Signed)
D: pt. In bed. Pt. Stated that he feels a shaky and very tired. Pt. Reported that he had some n/v. Pt. Stated that its hard for him to stand up for long periods of time and that his legs are kind of shaky. Denies SI/HI/AVH. Denies having pain. Reports having mild anxiety and tremors visible. A: Gatorade and ginger ale given. Prn and scheduled medications given. q 15 min safety checks R: pt. Reports decrease in n/v. Pt. Remains safe on unit. Pt. Did not attend groups, stayed in bed due to generalized weakness/ unsteadiness

## 2013-06-23 NOTE — Tx Team (Signed)
Interdisciplinary Treatment Plan Update (Adult)  Date: 06/23/2013   Time Reviewed: 12:34 PM  Progress in Treatment:  Attending groups: yes  Participating in groups:  Minimally (pt has COPD and severe difficulty breathing and speaking)  Taking medication as prescribed: Yes  Tolerating medication: Yes  Family/Significant othe contact made: No. SPE not required for this pt.  Patient understands diagnosis: Yes, AEB seeking treatment for ETOH detox and mood stabilization.  Discussing patient identified problems/goals with staff: Yes  Medical problems stabilized or resolved: Yes  Denies suicidal/homicidal ideation: Yes during admission, group/self report.  Patient has not harmed self or Others: Yes  New problem(s) identified: n/a  Discharge Plan or Barriers: Pt interested in referral to [facility] and [facility]. PT reports he went to [facility] "earlier in the year." Pt to follow-up at [facility] for med management. He currently lives with his brother in [place].  Additional comments: [name] is a 47 y.o. male who presents to [facility] for detox from alcohol. Pt denies SI/HI/Psych. Pt reports drinking 5-6 40's, daily. Pt.'s last drink was 06/19/13, he drank 6-40's. Pt denies any current w/d sxs, however, pt states that he experiences tremors and sweats when withdrawing. Pt has past inpt admissions with [facility] in 2012. Pt states that he wants to stop drinking because of his extensive health problems--COPD and HTN. Pt denies any issues with seizures/blackouts, no legal issues Reason for Continuation of Hospitalization: Librium taper-withdrawals Mood stabilization Estimated length of stay: 2-3 days  For review of initial/current patient goals, please see plan of care.  Attendees:  Patient:    Family:    Physician: Geoffery Lyons MD 06/23/2013 12:34 PM   Nursing: Victorino Dike RN 06/23/2013 12:34 PM   Clinical Social Worker Derrica Sieg Smart, LCSWA  06/23/2013 12:34 PM   Other:    Other:    Other: Darden Dates Nurse CM   06/23/2013 12:34 PM   Other:    Scribe for Treatment Team:  Trula Slade LCSWA 06/23/2013 12:34 PM

## 2013-06-23 NOTE — [facility] Group Notes (Signed)
[facility] LCSW Aftercare Discharge Planning Group Note   06/23/2013 9:58 AM  Participation Quality:  Appropriate   Mood/Affect:  Depressed  Depression Rating:  5  Anxiety Rating: 4  Thoughts of Suicide:  No Will you contract for safety?   NA  Current AVH:  No  Plan for Discharge/Comments:  Pt states that he is intersted in i/p referral to [facility] and/or [facility]. Pt reports that he was at [facility] "earlier this year." PT suffers from severe COPD and has noticeable difficulty breathing and speaking. Pt lives with brother currently. He rates withdrawals as 4 today.   Transportation Means: unknown at this time.   Supports: none identified at this time.   Smart, [employer]

## 2013-06-23 NOTE — [facility] Suicide Risk Assessment (Signed)
[facility] INPATIENT: Family/Significant Other Suicide Prevention Education  Suicide Prevention Education:  Education Completed; No one has been identified by the patient as the family member/significant other with whom the patient will be residing, and identified as the person(s) who will aid the patient in the event of a mental health crisis (suicidal ideations/suicide attempt).   Pt did not c/o SI at admission, nor have they endorsed SI during their stay here. SPE not required. SPI pamphlet provided to pt and he was encouraged to share this with his support network, ask questions, and talk about any concerns.   [employer], LCSWA 06/23/2013 12:42 PM

## 2013-06-23 NOTE — Progress Notes (Signed)
Patient ID: [name], male   DOB: [birth year], 47 y.o.   MRN: [id number]  D: Patient pleasant on approach today. He currently denies any depression. Wrote "no" on depression scale and "2" for hopelessness. Currently denies any SI. He is a high fall risk at present. Has a tremor and is very slowed at present. Told technicians on the unit to monitor and assist with meals. Patient following fall precautions (holding on to rails & getting up slowly). Also told that if he needs a walker or wheelchair to let us know. A: Staff will monitor on q 15 minute checks, follow treatment plan, and give meds as ordered. R: Cooperative on unit at present.

## 2013-06-23 NOTE — [facility] Group Notes (Signed)
[facility] LCSW Group Therapy  06/23/2013 4:30 PM  Type of Therapy:  Group Therapy  Participation Level:  Minimal  Participation Quality:  Attentive  Affect:  Appropriate  Cognitive:  Alert and Oriented  Insight:  Improving  Engagement in Therapy:  Improving  Modes of Intervention:  Confrontation, Discussion, Education, Exploration, Socialization and Support  Summary of Progress/Problems: Today's Topic: Overcoming Obstacles. Pt identified obstacles faced currently and processed barriers involved in overcoming these obstacles. Pt identified steps necessary for overcoming these obstacles and explored motivation (internal and external) for facing these difficulties head on. Pt further identified one area of concern in their lives and chose a skill of focus pulled from their "toolbox." [name] was attentive and engaged throughout today's therapy group. He still demonstrates extreme difficulty with breathing, coughing, and speaking due to COPD. [name] did not wish to discuss any obstacles that he anticipates facing but provided resource information for other group members "The [facility] is a great [facility] to [facility]." [name] shared waht this facility does in terms of services and encouraged others to take advantage of this resource, thus demonstrating progress in the group setting and improving insight.    Smart, [name] 06/23/2013, 4:30 PM

## 2013-06-23 NOTE — Progress Notes (Signed)
[facility] MD Progress Note  06/23/2013 5:23 PM [name]  MRN:  [id number] Subjective:  [name] is detoxing. His COPD is better today, He will like to go to [facility] but if he was not able to get a bed in the next day he would like to go home. He is not spontaneous, volunteers little information. He did sleep OK last nigth Diagnosis:   DSM5: Schizophrenia Disorders:  None Obsessive-Compulsive Disorders:  none Trauma-Stressor Disorders:  None  Substance/Addictive Disorders:  Alcohol Related Disorder - Severe (303.90) Depressive Disorders:  Major Depressive Disorder - Moderate (296.22)  Axis I: Substance Induced Mood Disorder  ADL's:  Intact  Sleep: Fair  Appetite:  Fair  Suicidal Ideation:  Plan:  denies Intent:  denies Means:  denies Homicidal Ideation:  Plan:  denies Intent:  denies Means:  denies AEB (as evidenced by):  Psychiatric Specialty Exam: Review of Systems  HENT: Negative.   Eyes: Negative.   Respiratory: Positive for shortness of breath.   Cardiovascular: Negative.   Gastrointestinal: Negative.   Genitourinary: Negative.   Musculoskeletal: Positive for back pain.       Side pain  Skin: Negative.   Neurological: Positive for weakness.  Endo/Heme/Allergies: Negative.   Psychiatric/Behavioral: Positive for depression and substance abuse. The patient is nervous/anxious.     Blood pressure 105/71, pulse 97, temperature 97.4 F (36.3 C), temperature source Oral, resp. rate 16, height 5\' 7"  (1.702 m), weight 61.689 kg (136 lb).Body mass index is 21.3 kg/(m^2).  General Appearance: Fairly Groomed  Patent attorney::  Fair  Speech:  Clear and Coherent and Slow  Volume:  Decreased  Mood:  Depressed  Affect:  Restricted  Thought Process:  Coherent and Goal Directed  Orientation:  Full (Time, Place, and Person)  Thought Content:  no spontaneous content, symptoms  Suicidal Thoughts:  No  Homicidal Thoughts:  No  Memory:  Immediate;   Fair Recent;   Fair Remote;    Fair  Judgement:  Fair  Insight:  Shallow  Psychomotor Activity:  Restlessness  Concentration:  Poor  Recall:  Poor  Akathisia:  No  Handed:    AIMS (if indicated):     Assets:  Desire for Improvement  Sleep:  Number of Hours: 5.25   Current Medications: Current Facility-Administered Medications  Medication Dose Route Frequency Provider Last Rate Last Dose  . acetaminophen (TYLENOL) tablet 650 mg  650 mg Oral Q6H PRN Benjaman Pott, MD   650 mg at 06/23/13 1657  . albuterol (PROVENTIL HFA;VENTOLIN HFA) 108 (90 BASE) MCG/ACT inhaler 2 puff  2 puff Inhalation Q6H PRN Fransisca Kaufmann, NP      . alum & mag hydroxide-simeth (MAALOX/MYLANTA) 200-200-20 MG/5ML suspension 30 mL  30 mL Oral Q4H PRN Benjaman Pott, MD      . chlordiazePOXIDE (LIBRIUM) capsule 25 mg  25 mg Oral BH-qamhs Rachael Fee, MD   25 mg at 06/23/13 0853   Followed by  . [START ON 06/24/2013] chlordiazePOXIDE (LIBRIUM) capsule 25 mg  25 mg Oral Daily Rachael Fee, MD      . chlordiazePOXIDE (LIBRIUM) capsule 50 mg  50 mg Oral Q6H PRN Nanine Means, NP   50 mg at 06/22/13 2042  . magnesium hydroxide (MILK OF MAGNESIA) suspension 30 mL  30 mL Oral Daily PRN Benjaman Pott, MD      . multivitamin with minerals tablet 1 tablet  1 tablet Oral Daily Earney Navy, NP   1 tablet at 06/23/13 0853  .  thiamine (B-1) injection 100 mg  100 mg Intramuscular Once Earney Navy, NP      . thiamine (VITAMIN B-1) tablet 100 mg  100 mg Oral Daily Earney Navy, NP   100 mg at 06/23/13 6045    Lab Results:  Results for orders placed during the hospital encounter of 06/20/13 (from the past 48 hour(s))  BASIC METABOLIC PANEL     Status: None   Collection Time    06/22/13  7:45 PM      Result Value Range   Sodium 139  135 - 145 mEq/L   Potassium 3.9  3.5 - 5.1 mEq/L   Chloride 100  96 - 112 mEq/L   CO2 30  19 - 32 mEq/L   Glucose, Bld 76  70 - 99 mg/dL   BUN 9  6 - 23 mg/dL   Creatinine, Ser 4.09  0.50 - 1.35 mg/dL    Calcium 81.1  8.4 - 10.5 mg/dL   GFR calc non Af Amer >90  >90 mL/min   GFR calc Af Amer >90  >90 mL/min   Comment: (NOTE)     The eGFR has been calculated using the CKD EPI equation.     This calculation has not been validated in all clinical situations.     eGFR's persistently <90 mL/min signify possible Chronic Kidney     Disease.     Performed at [facility]    Physical Findings: AIMS: Facial and Oral Movements Muscles of Facial Expression: None, normal Lips and Perioral Area: None, normal Jaw: None, normal Tongue: None, normal,Extremity Movements Upper (arms, wrists, hands, fingers): None, normal Lower (legs, knees, ankles, toes): None, normal, Trunk Movements Neck, shoulders, hips: None, normal, Overall Severity Severity of abnormal movements (highest score from questions above): None, normal Incapacitation due to abnormal movements: None, normal Patient's awareness of abnormal movements (rate only patient's report): No Awareness, Dental Status Current problems with teeth and/or dentures?: No Does patient usually wear dentures?: No  CIWA:  CIWA-Ar Total: 4 COWS:     Treatment Plan Summary: Daily contact with patient to assess and evaluate symptoms and progress in treatment Medication management  Plan: Supportive approach/coping skills/relapse prevention           Continue Librium detox            Reassess co morbidites  Medical Decision Making Problem Points:  Review of psycho-social stressors (1) Data Points:  Review of medication regiment & side effects (2)  I certify that inpatient services furnished can reasonably be expected to improve the patient's condition.   Kolten Ryback A 06/23/2013, 5:23 PM

## 2013-06-24 DIAGNOSIS — F1020 Alcohol dependence, uncomplicated: Secondary | ICD-10-CM

## 2013-06-24 DIAGNOSIS — F10939 Alcohol use, unspecified with withdrawal, unspecified: Principal | ICD-10-CM

## 2013-06-24 DIAGNOSIS — F10239 Alcohol dependence with withdrawal, unspecified: Principal | ICD-10-CM

## 2013-06-24 NOTE — Progress Notes (Signed)
[facility] Adult Case Management Discharge Plan :  Will you be returning to the same living situation after discharge: Yes,  returning home At discharge, do you have transportation home?:Yes,  CSW provided pt with a bus pass Do you have the ability to pay for your medications:Yes,  access to meds  Release of information consent forms completed and in the chart;  Patient's signature needed at discharge.  Patient to Follow up at: Follow-up Information   Follow up with [facility] On 06/25/2013. (Walk in on this date for hospital discharge appointment. Walk in clinic is Monday - Friday 8 am - 3 pm. They will then schedule you for medication management and therapy. )    Contact information:   [address], [place] [address] Phone: [phone] Fax: [phone]      Patient denies SI/HI:   Yes,  denies SI/HI    Safety Planning and Suicide Prevention discussed:  Yes,  discussed with pt, n/a to contact family/friend due to no SI on admission  Horton, Salome Arnt 06/24/2013, 10:27 AM

## 2013-06-24 NOTE — Progress Notes (Signed)
Pt discharged per MD orders; pt currently denies SI/HI and auditory/visual hallucinations; pt was given education by RN regarding follow-up appointments and medications and pt denied any questions or concerns about these instructions; pt was then escorted to search room to retrieve his belongings by RN before being discharged to hospital lobby. 

## 2013-06-24 NOTE — [facility] Suicide Risk Assessment (Signed)
Suicide Risk Assessment  Discharge Assessment     Demographic Factors:  Male  Mental Status Per Nursing Assessment::   On Admission:     Current Mental Status by Physician: In full contact with reality. There are no suicidal ideas, plans or intent. There are no active S/S of withdrawal. His mood is euthymic, his affect is appropriate. He is wanting to be D/C go back to where he was staying and follow up outpatient basis   Loss Factors: NA  Historical Factors: NA  Risk Reduction Factors:   Living with another person, especially a relative  Continued Clinical Symptoms:  Alcohol/Substance Abuse/Dependencies  Cognitive Features That Contribute To Risk:  Closed-mindedness Polarized thinking Thought constriction (tunnel vision)    Suicide Risk:  Minimal: No identifiable suicidal ideation.  Patients presenting with no risk factors but with morbid ruminations; may be classified as minimal risk based on the severity of the depressive symptoms  Discharge Diagnoses:   AXIS I:  Alcohol Dependence, S/P alcohol wihtdrawal AXIS II:  Deferred AXIS III:   Past Medical History  Diagnosis Date  . Hypertension   . Shortness of breath   . COPD (chronic obstructive pulmonary disease)   . Depression   . Alcohol abuse    AXIS IV:  other psychosocial or environmental problems AXIS V:  61-70 mild symptoms  Plan Of Care/Follow-up recommendations:  Activity:  as tolerated Diet:  regular Follow up outpatient basis Continue to work the relapse prevention plan Is patient on multiple antipsychotic therapies at discharge:  No   Has Patient had three or more failed trials of antipsychotic monotherapy by history:  No  Recommended Plan for Multiple Antipsychotic Therapies: NA  Tylan Briguglio A 06/24/2013, 9:40 AM

## 2013-06-24 NOTE — Progress Notes (Addendum)
Pt exhibited some anxiety tonight in relation to the security of his items. Writer went over the belongings sheet with pt. Writer informed pt of the location of his red bag. Pt was informed that he will sign off on the sheet, if his items are present. Pt assured that staff will address any missing items accordingly. Pt felt relieve to know more about his items being secured in our search-room. This Clinical research associate extended herself to this pt as needed. Pt was accepting of offer. Pt denied any physical pain or complications. He was alert and orientated x4.   0330 Pt is up with his bags packed at 0330 to leave and start his journey of walking home. He reports the need to report near the airport to complete a job application and to speak with someone around 1130 today/Tuesday. Pt informed that there is no discharge order present for this writer to send him home. Writer explained how the physician must perform a suicide risk assessment prior to him leaving. Writer apologized to pt for the inconvenience. Pt mumbles vulgar words about being in the hospital. Writer assisted pt in walking back to his room with his belongings. Writer and staff will continue to closely monitor pt.   Pt remains orientated x4.  Pts vital wdl with pulse ox of 97% room air. See vitals on doc flow sheets

## 2013-06-27 NOTE — Progress Notes (Signed)
Patient Discharge Instructions:  After Visit Summary (AVS):   Faxed to:  06/27/13 Psychiatric Admission Assessment Note:   Faxed to:  06/27/13 Suicide Risk Assessment - Discharge Assessment:   Faxed to:  06/27/13 Faxed/Sent to the Next Level Care provider:  06/27/13 Faxed to [facility] @ [phone]  Jerelene Redden, 06/27/2013, 3:10 PM

## 2013-07-02 NOTE — Discharge Summary (Signed)
Physician Discharge Summary Note  Patient:  [name] is an 47 y.o., male MRN:  [id number] DOB:  09/14/1965 Patient phone:  [phone] (home)  Patient address:   [address] [place] [address],   Date of Admission:  06/20/2013 Date of Discharge: 06/24/2013  Reason for Admission:  47 Y/O male with a history of alcohol dependence, came requesting detox. He had been drinking six plus 40 ounces. He experienced withdrawal when he tried to stop or decrease his alcohol intake. He had COPD and felt his COPD was getting worst.   Discharge Diagnoses: Active Problems:   * No active hospital problems. *  Review of Systems  Constitutional: Negative.   HENT: Negative.   Eyes: Negative.   Respiratory: Positive for shortness of breath.   Cardiovascular: Negative.   Gastrointestinal: Negative.   Genitourinary: Negative.   Musculoskeletal: Negative.   Skin: Negative.   Neurological: Negative.   Endo/Heme/Allergies: Negative.   Psychiatric/Behavioral: Positive for substance abuse.    DSM5:  Schizophrenia Disorders:  none Obsessive-Compulsive Disorders:  none Trauma-Stressor Disorders:  none Substance/Addictive Disorders:  Alcohol Related Disorder - Severe (303.90) Depressive Disorders:  none  Axis Diagnosis:   AXIS I:  alcohol withdrawal AXIS II:  Deferred AXIS III:   Past Medical History  Diagnosis Date  . Hypertension   . Shortness of breath   . COPD (chronic obstructive pulmonary disease)   . Depression   . Alcohol abuse    AXIS IV:  other psychosocial or environmental problems AXIS V:  51-60 moderate symptoms  Level of Care:  OP  Hospital Course:  He was admitted, started in individual and group psychotherapy. He was detox with Librium. Detox went uneventfully. He was initially wanting to go to [facility] but later changed his mind and wanted to go home and pursue outpatient treatment. Upon D/C he was in full contact with reality. There were no active suicidal  ideas plans or intent. There were no S/S of withdrawal. He expressed motivation to pursue further treatment.  Consults:  None  Significant Diagnostic Studies:  As per the chart  Discharge Vitals:   Blood pressure 129/70, pulse 79, temperature 98 F (36.7 C), temperature source Oral, resp. rate 20, height 5\' 7"  (1.702 m), weight 61.689 kg (136 lb), SpO2 97.00%. Body mass index is 21.3 kg/(m^2). Lab Results:   No results found for this or any previous visit (from the past 72 hour(s)).  Physical Findings: AIMS: Facial and Oral Movements Muscles of Facial Expression: None, normal Lips and Perioral Area: None, normal Jaw: None, normal Tongue: None, normal,Extremity Movements Upper (arms, wrists, hands, fingers): None, normal Lower (legs, knees, ankles, toes): None, normal, Trunk Movements Neck, shoulders, hips: None, normal, Overall Severity Severity of abnormal movements (highest score from questions above): None, normal Incapacitation due to abnormal movements: None, normal Patient's awareness of abnormal movements (rate only patient's report): No Awareness, Dental Status Current problems with teeth and/or dentures?: No Does patient usually wear dentures?: No  CIWA:  CIWA-Ar Total: 1 COWS:     Psychiatric Specialty Exam: See Psychiatric Specialty Exam and Suicide Risk Assessment completed by Attending Physician prior to discharge.  Discharge destination:  Home  Is patient on multiple antipsychotic therapies at discharge:  No   Has Patient had three or more failed trials of antipsychotic monotherapy by history:  No  Recommended Plan for Multiple Antipsychotic Therapies: NA     Medication List    STOP taking these medications       BC  HEADACHE POWDER PO     predniSONE 50 MG tablet  Commonly known as:  DELTASONE      TAKE these medications     Indication   albuterol 108 (90 BASE) MCG/ACT inhaler  Commonly known as:  PROVENTIL HFA;VENTOLIN HFA  Inhale 2 puffs into  the lungs every 6 (six) hours as needed for wheezing or shortness of breath.            Follow-up Information   Follow up with [facility] On 06/25/2013. (Walk in on this date for hospital discharge appointment. Walk in clinic is Monday - Friday 8 am - 3 pm. They will then schedule you for medication management and therapy. )    Contact information:   [address] [place] [address] Phone: [phone] Fax: [phone]      Follow-up recommendations:  Activity:  as tolerated Diet:  regular  Comments:  Continue to work your relapse prevention plan. Call [facility] if once you are home you think you need this level of care  Total Discharge Time:  Greater than 30 minutes.  Signed: Paquita Printy A 07/02/2013, 7:54 PM

## 2014-01-20 ENCOUNTER — Emergency Department (HOSPITAL_COMMUNITY): Payer: Self-pay

## 2014-01-20 ENCOUNTER — Emergency Department (HOSPITAL_COMMUNITY)
Admission: EM | Admit: 2014-01-20 | Discharge: 2014-01-20 | Disposition: A | Discharge status: Home / Self Care | Payer: Federal, State, Local not specified - Other | Attending: Emergency Medicine | Admitting: Emergency Medicine

## 2014-01-20 ENCOUNTER — Encounter (HOSPITAL_COMMUNITY): Payer: Self-pay | Admitting: Emergency Medicine

## 2014-01-20 DIAGNOSIS — M7989 Other specified soft tissue disorders: Secondary | ICD-10-CM

## 2014-01-20 DIAGNOSIS — T39091A Poisoning by salicylates, accidental (unintentional), initial encounter: Secondary | ICD-10-CM | POA: Insufficient documentation

## 2014-01-20 DIAGNOSIS — F1020 Alcohol dependence, uncomplicated: Secondary | ICD-10-CM | POA: Insufficient documentation

## 2014-01-20 DIAGNOSIS — T39094A Poisoning by salicylates, undetermined, initial encounter: Secondary | ICD-10-CM | POA: Insufficient documentation

## 2014-01-20 DIAGNOSIS — M79651 Pain in right thigh: Secondary | ICD-10-CM | POA: Diagnosis present

## 2014-01-20 DIAGNOSIS — M899 Disorder of bone, unspecified: Secondary | ICD-10-CM | POA: Insufficient documentation

## 2014-01-20 DIAGNOSIS — Z7982 Long term (current) use of aspirin: Secondary | ICD-10-CM | POA: Insufficient documentation

## 2014-01-20 DIAGNOSIS — F1010 Alcohol abuse, uncomplicated: Secondary | ICD-10-CM

## 2014-01-20 DIAGNOSIS — Y929 Unspecified place or not applicable: Secondary | ICD-10-CM | POA: Insufficient documentation

## 2014-01-20 DIAGNOSIS — M949 Disorder of cartilage, unspecified: Principal | ICD-10-CM

## 2014-01-20 DIAGNOSIS — Z79899 Other long term (current) drug therapy: Secondary | ICD-10-CM | POA: Insufficient documentation

## 2014-01-20 DIAGNOSIS — Y939 Activity, unspecified: Secondary | ICD-10-CM | POA: Insufficient documentation

## 2014-01-20 DIAGNOSIS — Z09 Encounter for follow-up examination after completed treatment for conditions other than malignant neoplasm: Secondary | ICD-10-CM

## 2014-01-20 DIAGNOSIS — F17200 Nicotine dependence, unspecified, uncomplicated: Secondary | ICD-10-CM | POA: Insufficient documentation

## 2014-01-20 DIAGNOSIS — J449 Chronic obstructive pulmonary disease, unspecified: Secondary | ICD-10-CM | POA: Insufficient documentation

## 2014-01-20 DIAGNOSIS — Z9289 Personal history of other medical treatment: Secondary | ICD-10-CM | POA: Insufficient documentation

## 2014-01-20 DIAGNOSIS — M79609 Pain in unspecified limb: Secondary | ICD-10-CM

## 2014-01-20 DIAGNOSIS — I10 Essential (primary) hypertension: Secondary | ICD-10-CM | POA: Insufficient documentation

## 2014-01-20 DIAGNOSIS — J4489 Other specified chronic obstructive pulmonary disease: Secondary | ICD-10-CM | POA: Insufficient documentation

## 2014-01-20 LAB — COMPREHENSIVE METABOLIC PANEL
ALT: 34 U/L (ref 0–53)
AST: 68 U/L — ABNORMAL HIGH (ref 0–37)
Albumin: 4.2 g/dL (ref 3.5–5.2)
Alkaline Phosphatase: 56 U/L (ref 39–117)
BUN: 4 mg/dL — AB (ref 6–23)
CHLORIDE: 101 meq/L (ref 96–112)
CO2: 21 meq/L (ref 19–32)
CREATININE: 0.64 mg/dL (ref 0.50–1.35)
Calcium: 9.2 mg/dL (ref 8.4–10.5)
GFR calc Af Amer: >90 mL/min (ref >90)
GLUCOSE: 93 mg/dL (ref 70–99)
Potassium: 3.8 mEq/L (ref 3.7–5.3)
Sodium: 140 mEq/L (ref 137–147)
Total Protein: 7.4 g/dL (ref 6.0–8.3)

## 2014-01-20 LAB — CBC WITH DIFFERENTIAL/PLATELET
Basophils Absolute: 0.1 10*3/uL (ref 0.0–0.1)
Basophils Relative: 2 % — ABNORMAL HIGH (ref 0–1)
Eosinophils Absolute: 0.2 10*3/uL (ref 0.0–0.7)
Eosinophils Relative: 5 % (ref 0–5)
HEMATOCRIT: 38.4 % — AB (ref 39.0–52.0)
HEMOGLOBIN: 14.1 g/dL (ref 13.0–17.0)
LYMPHS PCT: 45 % (ref 12–46)
Lymphs Abs: 1.7 10*3/uL (ref 0.7–4.0)
MCH: 36.4 pg — ABNORMAL HIGH (ref 26.0–34.0)
MCHC: 36.7 g/dL — ABNORMAL HIGH (ref 30.0–36.0)
MCV: 99.2 fL (ref 78.0–100.0)
MONO ABS: 0.3 10*3/uL (ref 0.1–1.0)
MONOS PCT: 8 % (ref 3–12)
Neutro Abs: 1.5 10*3/uL — ABNORMAL LOW (ref 1.7–7.7)
Neutrophils Relative %: 41 % — ABNORMAL LOW (ref 43–77)
Platelets: 215 10*3/uL (ref 150–400)
RBC: 3.87 MIL/uL — ABNORMAL LOW (ref 4.22–5.81)
RDW: 13.7 % (ref 11.5–15.5)
WBC: 3.7 10*3/uL — AB (ref 4.0–10.5)

## 2014-01-20 LAB — BASIC METABOLIC PANEL
BUN: 5 mg/dL — ABNORMAL LOW (ref 6–23)
CO2: 25 mEq/L (ref 19–32)
Calcium: 8.4 mg/dL (ref 8.4–10.5)
Chloride: 106 mEq/L (ref 96–112)
Creatinine, Ser: 0.7 mg/dL (ref 0.50–1.35)
GFR calc Af Amer: >90 mL/min (ref >90)
GFR calc non Af Amer: >90 mL/min (ref >90)
GLUCOSE: 81 mg/dL (ref 70–99)
Potassium: 3.9 mEq/L (ref 3.7–5.3)
SODIUM: 143 meq/L (ref 137–147)

## 2014-01-20 LAB — RAPID URINE DRUG SCREEN, HOSP PERFORMED
Amphetamines: NOT DETECTED
BENZODIAZEPINES: NOT DETECTED
Barbiturates: NOT DETECTED
Cocaine: NOT DETECTED
Opiates: NOT DETECTED
Tetrahydrocannabinol: NOT DETECTED

## 2014-01-20 LAB — PROTIME-INR
INR: 0.93 (ref 0.00–1.49)
PROTHROMBIN TIME: 12.3 s (ref 11.6–15.2)

## 2014-01-20 LAB — SALICYLATE LEVEL
SALICYLATE LVL: 26.8 mg/dL — AB (ref 2.8–20.0)
Salicylate Lvl: 31.7 mg/dL (ref 2.8–20.0)

## 2014-01-20 LAB — CK: Total CK: 652 U/L — ABNORMAL HIGH (ref 7–232)

## 2014-01-20 LAB — ETHANOL: ALCOHOL ETHYL (B): 317 mg/dL — AB (ref 0–11)

## 2014-01-20 LAB — ACETAMINOPHEN LEVEL: Acetaminophen (Tylenol), Serum: <15 ug/mL (ref 10–30)

## 2014-01-20 MED ORDER — LORAZEPAM 1 MG PO TABS: 0.0000 mg | ORAL_TABLET | Freq: Two times a day (BID) | ORAL | Status: DC

## 2014-01-20 MED ORDER — LORAZEPAM 1 MG PO TABS: 1.0000 mg | ORAL_TABLET | Freq: Three times a day (TID) | ORAL | Status: DC | PRN

## 2014-01-20 MED ORDER — LORAZEPAM 1 MG PO TABS: 0.0000 mg | ORAL_TABLET | Freq: Four times a day (QID) | ORAL | Status: DC

## 2014-01-20 MED ORDER — THIAMINE HCL 100 MG/ML IJ SOLN: 100.0000 mg | Freq: Every day | INTRAMUSCULAR | Status: DC

## 2014-01-20 MED ORDER — ACETAMINOPHEN 325 MG PO TABS: 650.0000 mg | ORAL_TABLET | ORAL | Status: DC | PRN

## 2014-01-20 MED ORDER — VITAMIN B-1 100 MG PO TABS
100.0000 mg | ORAL_TABLET | Freq: Every day | ORAL | Status: DC
  Administered 2014-01-20: 100 mg via ORAL
  Filled 2014-01-20: qty 1

## 2014-01-20 MED ORDER — SODIUM CHLORIDE 0.9 % IV BOLUS (SEPSIS)
1000.0000 mL | Freq: Once | INTRAVENOUS | Status: AC
  Administered 2014-01-20: 1000 mL via INTRAVENOUS

## 2014-01-20 NOTE — Progress Notes (Signed)
[facility] provided pt with a list of primary care resources to help patient establish a pcp.  °

## 2014-01-20 NOTE — ED Notes (Signed)
MD at bedside. 

## 2014-01-20 NOTE — ED Notes (Addendum)
Pt reports severe stabbing pain in r/posterior thigh x 1 week. Denies trauma. Pt reports increased pain when standing.Pt reports same pain last year, pain disappeared without treatment Pt took 6 dosages of goodie powder at 6am and repeated at 11am today

## 2014-01-20 NOTE — Consult Note (Signed)
[facility] Face-to-Face Psychiatry Consult   Reason for Consult:  Alcohol detox Referring Physician:  EDP  [name] is an 48 y.o. male. Total Time spent with patient: 20 minutes  Assessment: AXIS I:  Alcohol Abuse AXIS II:  Deferred AXIS III:   Past Medical History  Diagnosis Date  . Hypertension   . Shortness of breath   . COPD (chronic obstructive pulmonary disease)   . Depression   . Alcohol abuse    AXIS IV:  other psychosocial or environmental problems AXIS V:  61-70 mild symptoms  Plan:  No evidence of imminent risk to self or others at present.   Patient does not meet criteria for psychiatric inpatient admission. Supportive therapy provided about ongoing stressors. Discussed crisis plan, support from social network, calling 911, coming to the Emergency Department, and calling Suicide Hotline.  Subjective:   [name] is a 48 y.o. male patient.  HPI:  Patient states "I am here cause of my leg hurting.  When I'm sitting I'm okay when I stand I have pain.  No I'm not suicidal; I don't want to hurt nobody.  No not hearing voices of seeing thing, no not paranoid.  No I don't want detox.  I just had detox in [facility] and when I got out I stopped at the store and brought a beer.  I got some thing I need to do right now and I don't want detox." Patient denies suicidal/homicidal ideation, psychosis, and paranoia.  Patient is not seeking detox treatment at this time.   Past Psychiatric History: Past Medical History  Diagnosis Date  . Hypertension   . Shortness of breath   . COPD (chronic obstructive pulmonary disease)   . Depression   . Alcohol abuse     reports that he has been smoking Cigarettes.  He has been smoking about 2.00 packs per day. He does not have any smokeless tobacco history on file. He reports that he drinks about 6 ounces of alcohol per week. He reports that he uses illicit drugs (Cocaine and Marijuana). Family History  Problem Relation Age of  Onset  . Hypertension Mother   . Hypertension Father   . Hypertension Brother            Allergies:  No Known Allergies Objective: Blood pressure 119/79, pulse 84, temperature 97.5 F (36.4 C), resp. rate 23, SpO2 97.00%.There is no weight on file to calculate BMI. Results for orders placed during the hospital encounter of 01/20/14 (from the past 72 hour(s))  CBC WITH DIFFERENTIAL     Status: Abnormal   Collection Time    01/20/14 12:40 PM      Result Value Ref Range   WBC 3.7 (*) 4.0 - 10.5 K/uL   RBC 3.87 (*) 4.22 - 5.81 MIL/uL   Hemoglobin 14.1  13.0 - 17.0 g/dL   HCT 38.4 (*) 39.0 - 52.0 %   MCV 99.2  78.0 - 100.0 fL   MCH 36.4 (*) 26.0 - 34.0 pg   MCHC 36.7 (*) 30.0 - 36.0 g/dL   RDW 13.7  11.5 - 15.5 %   Platelets 215  150 - 400 K/uL   Neutrophils Relative % 41 (*) 43 - 77 %   Neutro Abs 1.5 (*) 1.7 - 7.7 K/uL   Lymphocytes Relative 45  12 - 46 %   Lymphs Abs 1.7  0.7 - 4.0 K/uL   Monocytes Relative 8  3 - 12 %   Monocytes Absolute 0.3  0.1 - 1.0 K/uL   Eosinophils Relative 5  0 - 5 %   Eosinophils Absolute 0.2  0.0 - 0.7 K/uL   Basophils Relative 2 (*) 0 - 1 %   Basophils Absolute 0.1  0.0 - 0.1 K/uL  COMPREHENSIVE METABOLIC PANEL     Status: Abnormal   Collection Time    01/20/14 12:40 PM      Result Value Ref Range   Sodium 140  137 - 147 mEq/L   Potassium 3.8  3.7 - 5.3 mEq/L   Chloride 101  96 - 112 mEq/L   CO2 21  19 - 32 mEq/L   Glucose, Bld 93  70 - 99 mg/dL   BUN 4 (*) 6 - 23 mg/dL   Creatinine, Ser 0.64  0.50 - 1.35 mg/dL   Calcium 9.2  8.4 - 10.5 mg/dL   Total Protein 7.4  6.0 - 8.3 g/dL   Albumin 4.2  3.5 - 5.2 g/dL   AST 68 (*) 0 - 37 U/L   ALT 34  0 - 53 U/L   Alkaline Phosphatase 56  39 - 117 U/L   Total Bilirubin <0.2 (*) 0.3 - 1.2 mg/dL   GFR calc non Af Amer >90  >90 mL/min   GFR calc Af Amer >90  >90 mL/min   Comment: (NOTE)     The eGFR has been calculated using the CKD EPI equation.     This calculation has not been validated in  all clinical situations.     eGFR's persistently <90 mL/min signify possible Chronic Kidney     Disease.  ETHANOL     Status: Abnormal   Collection Time    01/20/14 12:40 PM      Result Value Ref Range   Alcohol, Ethyl (B) 317 (*) 0 - 11 mg/dL   Comment:            LOWEST DETECTABLE LIMIT FOR     SERUM ALCOHOL IS 11 mg/dL     FOR MEDICAL PURPOSES ONLY  SALICYLATE LEVEL     Status: Abnormal   Collection Time    01/20/14 12:40 PM      Result Value Ref Range   Salicylate Lvl 78.2 (*) 2.8 - 20.0 mg/dL   Comment: CRITICAL RESULT CALLED TO, READ BACK BY AND VERIFIED WITH:     F. HASZ RN AT 4235 ON 06.02.15 BY SHUEA.  ACETAMINOPHEN LEVEL     Status: None   Collection Time    01/20/14 12:40 PM      Result Value Ref Range   Acetaminophen (Tylenol), Serum <15.0  10 - 30 ug/mL   Comment:            THERAPEUTIC CONCENTRATIONS VARY     SIGNIFICANTLY. A RANGE OF 10-30     ug/mL MAY BE AN EFFECTIVE     CONCENTRATION FOR MANY PATIENTS.     HOWEVER, SOME ARE BEST TREATED     AT CONCENTRATIONS OUTSIDE THIS     RANGE.     ACETAMINOPHEN CONCENTRATIONS     >150 ug/mL AT 4 HOURS AFTER     INGESTION AND >50 ug/mL AT 12     HOURS AFTER INGESTION ARE     OFTEN ASSOCIATED WITH TOXIC     REACTIONS.  PROTIME-INR     Status: None   Collection Time    01/20/14 12:40 PM      Result Value Ref Range   Prothrombin Time 12.3  11.6 - 15.2 seconds  INR 0.93  0.00 - 1.49  URINE RAPID DRUG SCREEN (HOSP PERFORMED)     Status: None   Collection Time    01/20/14  1:06 PM      Result Value Ref Range   Opiates NONE DETECTED  NONE DETECTED   Cocaine NONE DETECTED  NONE DETECTED   Benzodiazepines NONE DETECTED  NONE DETECTED   Amphetamines NONE DETECTED  NONE DETECTED   Tetrahydrocannabinol NONE DETECTED  NONE DETECTED   Barbiturates NONE DETECTED  NONE DETECTED   Comment:            DRUG SCREEN FOR MEDICAL PURPOSES     ONLY.  IF CONFIRMATION IS NEEDED     FOR ANY PURPOSE, NOTIFY LAB     WITHIN 5 DAYS.                 LOWEST DETECTABLE LIMITS     FOR URINE DRUG SCREEN     Drug Class       Cutoff (ng/mL)     Amphetamine      1000     Barbiturate      200     Benzodiazepine   626     Tricyclics       948     Opiates          300     Cocaine          300     THC              50   Labs are reviewed see above values.  Medications reviewed and no changes made.  Current Facility-Administered Medications  Medication Dose Route Frequency Provider Last Rate Last Dose  . acetaminophen (TYLENOL) tablet 650 mg  650 mg Oral Q4H PRN Wandra Arthurs, MD      . LORazepam (ATIVAN) tablet 0-4 mg  0-4 mg Oral 4 times per day Wandra Arthurs, MD       Followed by  . [START ON 01/22/2014] LORazepam (ATIVAN) tablet 0-4 mg  0-4 mg Oral Q12H Wandra Arthurs, MD      . LORazepam (ATIVAN) tablet 1 mg  1 mg Oral Q8H PRN Wandra Arthurs, MD      . thiamine (VITAMIN B-1) tablet 100 mg  100 mg Oral Daily Wandra Arthurs, MD       Or  . thiamine (B-1) injection 100 mg  100 mg Intravenous Daily Wandra Arthurs, MD       Current Outpatient Prescriptions  Medication Sig Dispense Refill  . albuterol (PROVENTIL HFA;VENTOLIN HFA) 108 (90 BASE) MCG/ACT inhaler Inhale 2 puffs into the lungs every 6 (six) hours as needed for wheezing or shortness of breath.      . Aspirin-Salicylamide-Caffeine (BC HEADACHE POWDER PO) Take by mouth.        Psychiatric Specialty Exam:     Blood pressure 119/79, pulse 84, temperature 97.5 F (36.4 C), resp. rate 23, SpO2 97.00%.There is no weight on file to calculate BMI.  General Appearance: Casual  Eye Contact::  Good  Speech:  Clear and Coherent and Normal Rate  Volume:  Normal  Mood:  "Iim good, just here for my leg pain"  Affect:  Congruent  Thought Process:  Circumstantial, Coherent, Goal Directed and "Iim good, just here for my leg pain"  Orientation:  Full (Time, Place, and Person)  Thought Content:  Rumination  Suicidal Thoughts:  No  Homicidal Thoughts:  No  Memory:  Immediate;  Good Recent;    Good Remote;   Good  Judgement:  Intact  Insight:  Present  Psychomotor Activity:  Normal  Concentration:  Fair  Recall:  Good  Fund of Knowledge:Good  Language: Good  Akathisia:  No  Handed:  Right  AIMS (if indicated):     Assets:  Communication Skills Desire for Improvement Housing Social Support  Sleep:      Musculoskeletal: Strength & Muscle Tone: within normal limits Gait & Station: Patient sitting in bed able to move all extremities without difficulty    Patient leans: N/A  Treatment Plan Summary: Patient is cleared psychiatrically.  EDP to continue treatment related to leg pan and discharge when care is completed Spoke with Dr. Doree Fudge (EDP) that patient denies SI/HI/AVH and is not seeking detox at this time  Shuvon Rankin, FNP-BC 01/20/2014 3:13 PM

## 2014-01-20 NOTE — ED Notes (Signed)
MD at bedside. ADMITTING MD PRESENT 

## 2014-01-20 NOTE — ED Notes (Signed)
Patient transported to X-ray 

## 2014-01-20 NOTE — ED Provider Notes (Addendum)
CSN: [id number]     Arrival date & time 01/20/14  1200 History   First MD Initiated Contact with Patient 01/20/14 1221     Chief Complaint  Patient presents with  . Leg Pain    r/thigh pain x 1 week. Stabbing pain in posterior thigh     (Consider location/radiation/quality/duration/timing/severity/associated sxs/prior Treatment) The history is provided by the patient.  [name] is a 48 y.o. male hx of HTN, SOB, COPD here with thigh pain. Right posterior thigh pain for the last week. Denies any fall or trauma. Has been taking Goody powder. He took 6 packets today and mother 6 packets at 68 AM. Also has been drinking more alcohol recently and last drink was today. He is a smoker. Poison control was contacted by the nurse and creat and recommended labs and salicylate levels.    Past Medical History  Diagnosis Date  . Hypertension   . Shortness of breath   . COPD (chronic obstructive pulmonary disease)   . Depression   . Alcohol abuse    Past Surgical History  Procedure Laterality Date  . Hemorrhoid surgery    . Mandible fracture surgery    . Back surgery    . Hernia repair     Family History  Problem Relation Age of Onset  . Hypertension Mother   . Hypertension Father   . Hypertension Brother    History  Substance Use Topics  . Smoking status: Current Every Day Smoker -- 2.00 packs/day    Types: Cigarettes  . Smokeless tobacco: Not on file  . Alcohol Use: 6.0 oz/week    10 Cans of beer per week     Comment: 6- 40's daily     Review of Systems  Musculoskeletal:       R leg pain   All other systems reviewed and are negative.     Allergies  Review of patient's allergies indicates no known allergies.  Home Medications   Prior to Admission medications   Medication Sig Start Date End Date Taking? Authorizing Provider  albuterol (PROVENTIL HFA;VENTOLIN HFA) 108 (90 BASE) MCG/ACT inhaler Inhale 2 puffs into the lungs every 6 (six) hours as needed for wheezing  or shortness of breath. 06/19/13  Yes Rodolph Bong, MD  Aspirin-Salicylamide-Caffeine [facility] HEADACHE POWDER PO) Take by mouth.   Yes Historical Provider, MD   BP 115/78  Pulse 89  Temp(Src) 97.5 F (36.4 C)  Resp 20  SpO2 97% Physical Exam  Nursing note and vitals reviewed. Constitutional: He is oriented to person, place, and time. He appears well-developed and well-nourished.  HENT:  Head: Normocephalic.  Mouth/Throat: Oropharynx is clear and moist.  Eyes: Conjunctivae and EOM are normal. Pupils are equal, round, and reactive to light.  Neck: Normal range of motion. Neck supple.  Cardiovascular: Normal rate, regular rhythm and normal heart sounds.   Pulmonary/Chest: Effort normal and breath sounds normal. No respiratory distress. He has no wheezes. He has no rales.  Abdominal: Soft. Bowel sounds are normal. He exhibits no distension. There is no tenderness. There is no rebound and no guarding.  Musculoskeletal:  R thigh slightly tender muscles. No obvious swelling. 2+ pulses.   Neurological: He is alert and oriented to person, place, and time. No cranial nerve deficit. Coordination normal.  Skin: Skin is warm and dry.  Psychiatric: He has a normal mood and affect. His behavior is normal. Judgment and thought content normal.    ED Course  Procedures (including critical care  time) Labs Review Labs Reviewed  CBC WITH DIFFERENTIAL - Abnormal; Notable for the following:    WBC 3.7 (*)    RBC 3.87 (*)    HCT 38.4 (*)    MCH 36.4 (*)    MCHC 36.7 (*)    Neutrophils Relative % 41 (*)    Neutro Abs 1.5 (*)    Basophils Relative 2 (*)    All other components within normal limits  COMPREHENSIVE METABOLIC PANEL - Abnormal; Notable for the following:    BUN 4 (*)    AST 68 (*)    Total Bilirubin <0.2 (*)    All other components within normal limits  ETHANOL - Abnormal; Notable for the following:    Alcohol, Ethyl (B) 317 (*)    All other components within normal limits  SALICYLATE  LEVEL - Abnormal; Notable for the following:    Salicylate Lvl 31.7 (*)    All other components within normal limits  ACETAMINOPHEN LEVEL  URINE RAPID DRUG SCREEN (HOSP PERFORMED)  PROTIME-INR  SALICYLATE LEVEL  BASIC METABOLIC PANEL  CK    Imaging Review Dg Femur Right  01/20/2014   CLINICAL DATA:  Fall  EXAM: RIGHT FEMUR - 2 VIEW  COMPARISON:  None.  FINDINGS: No acute fracture. No dislocation. There are lesions in the distal femur and proximal tibia characterized by calcified curvilinear matrix and eccentric location. 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 An chondroma is in the differential diagnosis. No soft tissue mass. No cortical breakthroughs.  IMPRESSION: No acute bony injury.  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 If there is localized pain to these locations, consider MRI to rule out underlying neoplasm.   Electronically Signed   By: Maryclare BeanArt  Hoss M.D.   On: 01/20/2014 14:17     EKG Interpretation   Date/Time:  Tuesday January 20 2014 12:50:39 EDT Ventricular Rate:  81 PR Interval:  179 QRS Duration: 96 QT Interval:  382 QTC Calculation: 443 R Axis:   76 Text Interpretation:  Sinus rhythm Borderline low voltage, extremity leads  ST elevation likely early repol No significant change since last tracing  Confirmed by Shlomie Romig  MD, Iyauna Sing ([id number]) on 01/20/2014 12:58:45 PM      MDM   Final diagnoses:  None   [name] is a 48 y.o. male here with R thigh pain and possible overdose on goody powder. Not suicidal. RLE US showed no DVT. No signs of GI bleed. Poison control contacted and recommend salicylate level, drug tox.  1:30 PM  Salicylate elevated. Poison control recommend repeat BMP and salicylate in an hour. If CO2 > 20 and salicylate trending down, will not need to recheck or monitor.   2:38 PM Xray showed bone infarcts. Will call medicine regarding  further workup.   2:49 PM I talked to Dr. Waymon AmatoHongalgi, who will review his labs, xrays and will leave consult note. Recommend repeat salicylate as per poison control. Recommend outpatient workup for possible bone lesions. Will get TTS consult regarding detox.   3 PM Signed out to Dr. Fredderick PhenixBelfi to follow up medicine consult and repeat salicylate level and BMP.   3:18 PM Patient doesn't want detox. Medicine consult pending. Labs pending.   Richardean Canalavid H Keivon Garden, MD 01/20/14 1450  Richardean Canalavid H Antoinette Borgwardt, MD 01/20/14 [phone]

## 2014-01-20 NOTE — ED Provider Notes (Signed)
Care was taken over from Dr. Silverio Lay. Patient appeared intoxicated and with an increase in salicylate usage. Poison control has been contacted and it was felt that if his salicylate level is decreasing and his bicarbonate is greater than 20, he can be discharged home. On recheck blood work, his salicylate level has decreased and his bicarbonate is 25. He initially wanted detox, but now he is refusing and wants to go home. He's been given outpatient resources for possible followup. He also presented with leg pain and on x-ray, there is a question of some bone lesions versus infarcts. He's been seen by medicine and cleared. His pain seems to be in a different location than the bone lesions. This is something that can be followed up as an outpatient. I gave him a referral to orthopedics and stressed the importance of followup.  Danny Bucco, MD 01/20/14 [phone]

## 2014-01-20 NOTE — Consult Note (Addendum)
[facility] Medical Consultation  YASHUA BRACCO RUE:[id number] DOB: [birth year] DOA: 01/20/2014 PCP: No primary provider on file.   Requesting physician: Dr. Chaney Malling Date of consultation: 01/20/2014. Reason for consultation: Right thigh pain and abnormal Xray, evaluation.  Impression/Recommendations Principal Problem:   Pain of right thigh Active Problems:   Alcohol dependence   Salicylate overdose    1. Right posterior thigh pain: Unclear etiology. Patient denies history of direct trauma or activity that might have precipitated sprain. Minimal tenderness of posterior thigh musculature but no other acute findings. Mildly elevated CK but less likely to be florid rhabdomyolysis. Right lower extremity venous Dopplers negative for DVT. X-ray of right femur showed bony lesions in the distal femur and proximal tibia suggesting bony infarcts. Discussed with the reading radiologist who indicated that these lesions are around the knee and unless he has any symptoms related to his knee, would not recommend further imaging. These bony infarcts may be secondary to patient's history of alcohol abuse. He denies history of sickle cell disease. Recommend adequate oral hydration, pain management, rest and local ice therapy. Advised patient that if his pain does not get better or if it worsens, he may need orthopedic consultation for further evaluation. He is also advised to followup with his PCP regarding further evaluation of abnormal x-ray findings. 2. Salicylate overdose: Patient took excess of BC powder for his right thigh pain. No suicidal intent or ideations. He denies tinnitus, nausea, vomiting or abdominal pain. EDP discussed with poison control center who recommended repeating his salicylate levels and bicarbonate levels 2 hours after initial one's and if salicylate level was decreasing and bicarbonate level was greater than 20, no further workup. This M.D. discussed with Ms. Jola Babinski at the poison  control center who reiterated same recommendations. Patient's salicylate level is decreasing and his bicarbonate is 25. He has been extensively counseled to avoid salicylate-containing medications i.e. aspirin, BC powder, Excedrin and other such medications in the immediate future and he verbalized understanding. He has also been counseled to seek immediate medical attention if he has any worsening symptoms. 3. Alcohol dependence/intoxication: patient continues to drink heavily. Abstinence strongly counseled. Patient declines detox at this time. 4. Tobacco abuse: Cessation counseled. 5. COPD: Stable 6. Hypertension: currently controlled. 7. Mild leukopenia: Likely secondary to alcohol abuse.   TR[facility] will sign off at this time. Please contact me if I can be of assistance in the meanwhile. Thank you for this consultation.  Chief Complaint: Right thigh pain.  HPI:  48 year old male patient with history of alcohol abuse, tobacco abuse, hypertension, depression, COPD, presented to the ED on 01/20/14 with complaints of right posterior thigh pain. Patient states that he first noticed this pain approximately 1.5 weeks ago. The pain was gradual in onset and has slowly worsened. He describes the pain in the mid posterior thigh, sharp in nature, nonradiating, relieved by lying in bed or sitting and made worse by ambulating. He denies history of direct trauma or activity that might have led to a sprain of his right thigh. She has not noticed any swelling, redness or increased warmth in that leg. This morning he took 6 packets of BC powder at 5 AM and then repeated another such dose at 11 AM. Due to persisting pain, he presented to the ED where his salicylate level was elevated at 31. 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  TRH was consulted to assist with evaluation of right thigh pain and abnormality reported on x-ray.  Review of Systems:  All systems reviewed and apart from history of presenting illness, are negative. Patient denies suicidal or homicidal ideations. No delusions or hallucinations. Denies tinnitus, nausea, vomiting or abdominal pain.  Past Medical History  Diagnosis Date  . Hypertension   . Shortness of breath   . COPD (chronic obstructive pulmonary disease)   . Depression   . Alcohol abuse    Past Surgical History  Procedure Laterality Date  . Hemorrhoid surgery    . Mandible fracture surgery    . Back surgery    . Hernia repair     Social History:  reports that he has been smoking Cigarettes.  He has been smoking about 2.00 packs per day. He has never used smokeless tobacco. He reports that he drinks about 6 ounces of alcohol per week. He reports that he uses illicit drugs (Cocaine and Marijuana). Single. Lives with his brother-in-law and brother. Independent of activities of daily living. Panhandles.  No Known Allergies Family History  Problem Relation Age of Onset  . Hypertension Mother   . Hypertension Father   . Hypertension Brother     Prior to Admission medications   Medication Sig Start Date End Date Taking? Authorizing Provider  albuterol (PROVENTIL HFA;VENTOLIN HFA) 108 (90 BASE) MCG/ACT inhaler Inhale 2 puffs into the lungs every 6 (six) hours as needed for wheezing or shortness of breath. 06/19/13  Yes Rodolph Bong, MD  Aspirin-Salicylamide-Caffeine [facility] HEADACHE POWDER PO) Take by mouth.   Yes Historical Provider, MD   Physical Exam: Blood pressure 112/64, pulse 94, temperature 98.1 F (36.7 C), temperature source Axillary, resp. rate 18, SpO2 100.00%. Filed Vitals:   01/20/14 1618  BP: 112/64  Pulse: 94  Temp: 98.1 F (36.7 C)  Resp: 18     General:  Young moderately built and nourished male lying comfortably supine on the gurney.  Eyes: Pupils equally  reacting to light and accommodation. Extraocular muscle movements intact. No nystagmus.  ENT: Oral mucosa moist. Strong alcohol smell in the breath.  Neck: Supple. No JVD, carotid bruit or thyromegaly.  Cardiovascular: S1 and S2 heard, RRR. No JVD, murmurs or pedal edema.  Respiratory: Clear to auscultation. No increased work of breathing.  Abdomen: Nondistended, soft and nontender. Normal bowel sounds heard. No organomegaly or masses appreciated. Tiny supraumbilical uncomplicated hernia.  Skin: Negative exam.  Musculoskeletal: Mild right mid posterior thigh tenderness to deep palpation but without any other acute findings. No asymmetrical swelling, increased warmth, skin changes, fluctuance or crepitus. Good bounding peripheral pulses symmetrically.  Psychiatric: Pleasant and cooperative.  Neurologic: Alert and oriented. No focal deficits.  Labs on Admission:  Basic Metabolic Panel:  Recent Labs Lab 01/20/14 1240 01/20/14 1446  NA 140 143  K 3.8 3.9  CL 101 106  CO2 21 25  GLUCOSE 93 81  BUN 4* 5*  CREATININE 0.64 0.70  CALCIUM 9.2 8.4   Liver Function Tests:  Recent Labs Lab 01/20/14 1240  AST 68*  ALT 34  ALKPHOS 56  BILITOT <0.2*  PROT 7.4  ALBUMIN 4.2   No results found for this basename: LIPASE, AMYLASE,  in the last 168 hours No results found for this basename: AMMONIA,  in the last 168 hours CBC:  Recent Labs Lab 01/20/14 1240  WBC  3.7*  NEUTROABS 1.5*  HGB 14.1  HCT 38.4*  MCV 99.2  PLT 215   Cardiac Enzymes:  Recent Labs Lab 01/20/14 1446  CKTOTAL 652*   Additional labs  Salicylate level: 31.7 > 26.8  Acetaminophen level: <15  UDS: Negative  Blood alcohol level: 317  BNP: No components found with this basename: POCBNP,  CBG: No results found for this basename: GLUCAP,  in the last 168 hours  Radiological Exams on Admission: Dg Femur Right  01/20/2014   CLINICAL DATA:  Fall  EXAM: RIGHT FEMUR - 2 VIEW  COMPARISON:  None.   FINDINGS: No acute fracture. No dislocation. There are lesions in the distal femur and proximal tibia characterized by calcified curvilinear matrix and eccentric location. 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 An chondroma is in the differential diagnosis. No soft tissue mass. No cortical breakthroughs.  IMPRESSION: No acute bony injury.  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 If there is localized pain to these locations, consider MRI to rule out underlying neoplasm.   Electronically Signed   By: Maryclare BeanArt  Hoss M.D.   On: 01/20/2014 14:17    EKG: Independently reviewed. Sinus rhythm without acute changes.  Time spent: 60 minutes  Elease EtienneAnand D Kalah Pflum, MD, FACP, [facility]. [facility] Pager [phone]  If 7PM-7AM, please contact night-coverage [email or web address] Password [email or web address] 01/20/2014, 4:30 PM  01/20/2014, 4:30 PM

## 2014-01-20 NOTE — ED Notes (Signed)
Lab at bs

## 2014-01-20 NOTE — Discharge Instructions (Signed)
Alcohol Use Disorder °Alcohol use disorder is a mental disorder. It is not a one-time incident of heavy drinking. Alcohol use disorder is the excessive and uncontrollable use of alcohol over time that leads to problems with functioning in one or more areas of daily living. People with this disorder risk harming themselves and others when they drink to excess. Alcohol use disorder also can cause other mental disorders, such as mood and anxiety disorders, and serious physical problems. People with alcohol use disorder often misuse other drugs.  °Alcohol use disorder is common and widespread. Some people with this disorder drink alcohol to cope with or escape from negative life events. Others drink to relieve chronic pain or symptoms of mental illness. People with a family history of alcohol use disorder are at higher risk of losing control and using alcohol to excess.  °SYMPTOMS  °Signs and symptoms of alcohol use disorder may include the following:  °· Consumption of alcohol in larger amounts or over a longer period of time than intended. °· Multiple unsuccessful attempts to cut down or control alcohol use.   °· A great deal of time spent obtaining alcohol, using alcohol, or recovering from the effects of alcohol (hangover). °· A strong desire or urge to use alcohol (cravings).   °· Continued use of alcohol despite problems at work, school, or home because of alcohol use.   °· Continued use of alcohol despite problems in relationships because of alcohol use. °· Continued use of alcohol in situations when it is physically hazardous, such as driving a car. °· Continued use of alcohol despite awareness of a physical or psychological problem that is likely related to alcohol use. Physical problems related to alcohol use can involve the brain, heart, liver, stomach, and intestines. Psychological problems related to alcohol use include intoxication, depression, anxiety, psychosis, delirium, and dementia.   °· The need for  increased amounts of alcohol to achieve the same desired effect, or a decreased effect from the consumption of the same amount of alcohol (tolerance). °· Withdrawal symptoms upon reducing or stopping alcohol use, or alcohol use to reduce or avoid withdrawal symptoms. Withdrawal symptoms include: °· Racing heart. °· Hand tremor. °· Difficulty sleeping. °· Nausea. °· Vomiting. °· Hallucinations. °· Restlessness. °· Seizures. °DIAGNOSIS °Alcohol use disorder is diagnosed through an assessment by your caregiver. Your caregiver may start by asking three or four questions to screen for excessive or problematic alcohol use. To confirm a diagnosis of alcohol use disorder, at least two symptoms (see SYMPTOMS) must be present within a 12-month period. The severity of alcohol use disorder depends on the number of symptoms: °· Mild two or three. °· Moderate four or five. °· Severe six or more. °Your caregiver may perform a physical exam or use results from lab tests to see if you have physical problems resulting from alcohol use. Your caregiver may refer you to a mental health professional for evaluation. °TREATMENT  °Some people with alcohol use disorder are able to reduce their alcohol use to low-risk levels. Some people with alcohol use disorder need to quit drinking alcohol. When necessary, mental health professionals with specialized training in substance use treatment can help. Your caregiver can help you decide how severe your alcohol use disorder is and what type of treatment you need. The following forms of treatment are available:  °· Detoxification. Detoxification involves the use of prescription medication to prevent alcohol withdrawal symptoms in the first week after quitting. This is important for people with a history of symptoms of withdrawal and for heavy   drinkers who are likely to have withdrawal symptoms. Alcohol withdrawal can be dangerous and, in severe cases, cause death. Detoxification is usually provided  in a hospital or in-patient substance use treatment facility.  Counseling or talk therapy. Talk therapy is provided by substance use treatment counselors. It addresses the reasons people use alcohol and ways to keep them from drinking again. The goals of talk therapy are to help people with alcohol use disorder find healthy activities and ways to cope with life stress, to identify and avoid triggers for alcohol use, and to handle cravings, which can cause relapse.  Medication.Different medications can help treat alcohol use disorder through the following actions:  Decrease alcohol cravings.  Decrease the positive reward response felt from alcohol use.  Produce an uncomfortable physical reaction when alcohol is used (aversion therapy).  Support groups. Support groups are run by people who have quit drinking. They provide emotional support, advice, and guidance. These forms of treatment are often combined. Some people with alcohol use disorder benefit from intensive combination treatment provided by specialized substance use treatment centers. Both inpatient and outpatient treatment programs are available. Document Released: 09/14/2004 Document Revised: 04/09/2013 Document Reviewed: 11/14/2012 [facility] Patient Information 2014 [place], [place].  Musculoskeletal Pain Musculoskeletal pain is muscle and boney aches and pains. These pains can occur in any part of the body. Your caregiver may treat you without knowing the cause of the pain. They may treat you if blood or urine tests, X-rays, and other tests were normal.  CAUSES There is often not a definite cause or reason for these pains. These pains may be caused by a type of germ (virus). The discomfort may also come from overuse. Overuse includes working out too hard when your body is not fit. Boney aches also come from weather changes. Bone is sensitive to atmospheric pressure changes. HOME CARE INSTRUCTIONS   Ask when your test results will be  ready. Make sure you get your test results.  Only take over-the-counter or prescription medicines for pain, discomfort, or fever as directed by your caregiver. If you were given medications for your condition, do not drive, operate machinery or power tools, or sign legal documents for 24 hours. Do not drink alcohol. Do not take sleeping pills or other medications that may interfere with treatment.  Continue all activities unless the activities cause more pain. When the pain lessens, slowly resume normal activities. Gradually increase the intensity and duration of the activities or exercise.  During periods of severe pain, bed rest may be helpful. Lay or sit in any position that is comfortable.  Putting ice on the injured area.  Put ice in a bag.  Place a towel between your skin and the bag.  Leave the ice on for 15 to 20 minutes, 3 to 4 times a day.  Follow up with your caregiver for continued problems and no reason can be found for the pain. If the pain becomes worse or does not go away, it may be necessary to repeat tests or do additional testing. Your caregiver may need to look further for a possible cause. SEEK IMMEDIATE MEDICAL CARE IF:  You have pain that is getting worse and is not relieved by medications.  You develop chest pain that is associated with shortness or breath, sweating, feeling sick to your stomach (nauseous), or throw up (vomit).  Your pain becomes localized to the abdomen.  You develop any new symptoms that seem different or that concern you. MAKE SURE YOU:   Understand  these instructions.  Will watch your condition.  Will get help right away if you are not doing well or get worse. Document Released: 08/07/2005 Document Revised: 10/30/2011 Document Reviewed: 04/11/2013 [facility] Patient Information 2014 [place], [place].

## 2014-01-20 NOTE — ED Notes (Addendum)
Dr. Silverio Lay at bedside, Ultrasound at bedside -per order. Labs ordered by Dr Silverio Lay consistent with recommendation of [facility] -Judeth Cornfield RN. [facility] will call back to review status

## 2014-01-20 NOTE — Progress Notes (Addendum)
  CARE MANAGEMENT ED NOTE 01/20/2014  Patient:  [name], [name]   Account Number:  [id number]  Date Initiated:  01/20/2014  Documentation initiated by:  Edd Arbour  Subjective/Objective Assessment:   48 yr old self pay [place] homeless patient states he is presently living off & on with a "brother or on the streets" "I use to date his sister but he is not really my brother" presented to the ED on 01/20/14 with complaints of right     Subjective/Objective Assessment Detail:   mid posterior thigh  took 6 packets of BC powder at 5 AM and then repeated another such dose at 11 AM. 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 by [facility] to get his ID     Action/Plan:   spoke with Admission RN, Spoke with admission RN about pt medication concerns   Action/Plan Detail:   1621 ED CM spoke with Dr Fredderick Phenix who states she is not discharging pt on medications therefore he will not need medication assistance   Anticipated DC Date:  01/20/2014     Status Recommendation to Physician:   Result of Recommendation:    Other ED Services  Consult Working Plan    DC Planning Services  Other  Outpatient Services - Pt will follow up  PCP issues [facility] / [facility] (established/new)    Choice offered to / List presented to:            Status of service:  Completed, signed off  ED Comments:   ED Comments Detail:   CM spoke with pt who confirms self pay [facility] resident with no pcp. CM discussed  information for self pay pcps, importance of pcp for f/u care, www.needymeds.org, discounted pharmacies and other [employer] such as financial assistance, IRC, P4[facility] orange card, DSS and  health department  Reviewed resources for [employer] self pay pcps like [facility]ov[facility] Kussmaul, family medicine at [address], [facility] family practice, general  medical clinics, [facility] urgent care plus others, [facility] out patient pharmacies and housing Pt voiced understanding and appreciation of resources provided   Encouraged use of P4[facility] information provided by [facility] staff Noted [facility] staff sheets on pt's personal belongings Pt left ED prior to receiving Engineer, mining. Noted pt giggling inappropriately during assessment without cause and fidgeting in bed

## 2014-01-20 NOTE — Progress Notes (Signed)
*  Preliminary Results* Right lower extremity venous duplex completed. Right lower extremity is negative for deep vein thrombosis. There is no evidence of right Baker's cyst.  Preliminary results discussed with Dr.Yao.  01/20/2014 12:44 PM  Gertie Fey, RVT, RDCS, RDMS

## 2014-01-20 NOTE — ED Notes (Signed)
Poison control called. Repeat salicylate level and BMP at 1440, (2 hrs after 1st blood draw) should expect salicylate level to decrease. Check potassium and CO2 level. Want CO2>20. If all levels go down pt should be fine and need no further follow up with poison control.

## 2014-01-21 NOTE — Consult Note (Signed)
Face to face evaluation and I agree with this note 

## 2014-02-19 ENCOUNTER — Encounter (HOSPITAL_COMMUNITY): Payer: Self-pay | Admitting: Emergency Medicine

## 2014-02-19 ENCOUNTER — Emergency Department (HOSPITAL_COMMUNITY)
Admission: EM | Admit: 2014-02-19 | Discharge: 2014-02-19 | Disposition: A | Discharge status: Home / Self Care | Payer: Self-pay | Attending: Emergency Medicine | Admitting: Emergency Medicine

## 2014-02-19 DIAGNOSIS — F17200 Nicotine dependence, unspecified, uncomplicated: Secondary | ICD-10-CM | POA: Insufficient documentation

## 2014-02-19 DIAGNOSIS — S76311A Strain of muscle, fascia and tendon of the posterior muscle group at thigh level, right thigh, initial encounter: Secondary | ICD-10-CM

## 2014-02-19 DIAGNOSIS — Z8659 Personal history of other mental and behavioral disorders: Secondary | ICD-10-CM | POA: Insufficient documentation

## 2014-02-19 DIAGNOSIS — J449 Chronic obstructive pulmonary disease, unspecified: Secondary | ICD-10-CM | POA: Insufficient documentation

## 2014-02-19 DIAGNOSIS — X58XXXA Exposure to other specified factors, initial encounter: Secondary | ICD-10-CM | POA: Insufficient documentation

## 2014-02-19 DIAGNOSIS — J4489 Other specified chronic obstructive pulmonary disease: Secondary | ICD-10-CM | POA: Insufficient documentation

## 2014-02-19 DIAGNOSIS — I10 Essential (primary) hypertension: Secondary | ICD-10-CM | POA: Insufficient documentation

## 2014-02-19 DIAGNOSIS — Z7982 Long term (current) use of aspirin: Secondary | ICD-10-CM | POA: Insufficient documentation

## 2014-02-19 DIAGNOSIS — Y929 Unspecified place or not applicable: Secondary | ICD-10-CM | POA: Insufficient documentation

## 2014-02-19 DIAGNOSIS — Y939 Activity, unspecified: Secondary | ICD-10-CM | POA: Insufficient documentation

## 2014-02-19 DIAGNOSIS — IMO0002 Reserved for concepts with insufficient information to code with codable children: Secondary | ICD-10-CM | POA: Insufficient documentation

## 2014-02-19 NOTE — Discharge Instructions (Signed)
Return here as needed.  Take 800 mg of ibuprofen every 6-8 hours.  Use ice and heat on your leg.  Cut down the amount of walking that you do each day and rest

## 2014-02-19 NOTE — ED Notes (Addendum)
Pt reports right upper leg pain x "several months." Denies injury. Denies swelling. States "it just aches", pain worse with ambulation. [facility]  Pt in NAD.

## 2014-02-19 NOTE — Progress Notes (Signed)
ED CM consulted by C. Lawyer PA-C regarding medication assistance. [facility] with complaining of constant right upper leg pain that started a month ago. [facility] and had x-rays performed on the right leg that were negative. The patient states that movement aggravates the pain and sitting alleviates the pain.Pt is homeless without a PCP or insurance. Discussed establishing care for f/u. Pt states, that [facility]. Explained that the [facility] have a on-site provided that serves it's clients. Instructed to patient to inquiry. Pt verbalized understanding and appreciation. He agrees with plan to f/u. Pt being discharged with prescription for 800mg  Ibuprofen. Pt made aware that prescription has been  pre-paid. [facility]. Pt verbalizes understanding teach back done. Discussed plan with C. Lawyer PA-C and Amy RN they are agreeable. No further Case Management needs identified.
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
## 2014-02-23 NOTE — Discharge Planning (Signed)
[facility] Community Liaison was not able to see patient, [facility] orange card information will be sent to the address provided. °

## 2014-03-01 NOTE — ED Provider Notes (Signed)
Medical screening examination/treatment/procedure(s) were performed by non-physician practitioner and as supervising physician I was immediately available for consultation/collaboration.   EKG Interpretation None       Jowel Waltner L Edrian Melucci, MD 03/01/14 1425 

## 2014-06-08 ENCOUNTER — Emergency Department (HOSPITAL_COMMUNITY): Payer: Self-pay

## 2014-06-08 ENCOUNTER — Encounter (HOSPITAL_COMMUNITY): Payer: Self-pay | Admitting: Emergency Medicine

## 2014-06-08 ENCOUNTER — Emergency Department (HOSPITAL_COMMUNITY)
Admission: EM | Admit: 2014-06-08 | Discharge: 2014-06-08 | Disposition: A | Discharge status: Home / Self Care | Payer: Self-pay | Attending: Emergency Medicine | Admitting: Emergency Medicine

## 2014-06-08 DIAGNOSIS — Z7982 Long term (current) use of aspirin: Secondary | ICD-10-CM | POA: Insufficient documentation

## 2014-06-08 DIAGNOSIS — I10 Essential (primary) hypertension: Secondary | ICD-10-CM | POA: Insufficient documentation

## 2014-06-08 DIAGNOSIS — J159 Unspecified bacterial pneumonia: Secondary | ICD-10-CM | POA: Insufficient documentation

## 2014-06-08 DIAGNOSIS — R0789 Other chest pain: Secondary | ICD-10-CM | POA: Insufficient documentation

## 2014-06-08 DIAGNOSIS — J189 Pneumonia, unspecified organism: Secondary | ICD-10-CM

## 2014-06-08 DIAGNOSIS — Z8659 Personal history of other mental and behavioral disorders: Secondary | ICD-10-CM | POA: Insufficient documentation

## 2014-06-08 DIAGNOSIS — J449 Chronic obstructive pulmonary disease, unspecified: Secondary | ICD-10-CM | POA: Insufficient documentation

## 2014-06-08 DIAGNOSIS — Z720 Tobacco use: Secondary | ICD-10-CM | POA: Insufficient documentation

## 2014-06-08 DIAGNOSIS — Z79899 Other long term (current) drug therapy: Secondary | ICD-10-CM | POA: Insufficient documentation

## 2014-06-08 LAB — CBC
HCT: 36.5 % — ABNORMAL LOW (ref 39.0–52.0)
Hemoglobin: 12.9 g/dL — ABNORMAL LOW (ref 13.0–17.0)
MCH: 35.9 pg — AB (ref 26.0–34.0)
MCHC: 35.3 g/dL (ref 30.0–36.0)
MCV: 101.7 fL — AB (ref 78.0–100.0)
PLATELETS: 148 10*3/uL — AB (ref 150–400)
RBC: 3.59 MIL/uL — AB (ref 4.22–5.81)
RDW: 13.7 % (ref 11.5–15.5)
WBC: 3.9 10*3/uL — ABNORMAL LOW (ref 4.0–10.5)

## 2014-06-08 LAB — BASIC METABOLIC PANEL
ANION GAP: 17 — AB (ref 5–15)
BUN: 4 mg/dL — ABNORMAL LOW (ref 6–23)
CALCIUM: 8.7 mg/dL (ref 8.4–10.5)
CO2: 23 meq/L (ref 19–32)
CREATININE: 0.48 mg/dL — AB (ref 0.50–1.35)
Chloride: 100 mEq/L (ref 96–112)
GFR calc Af Amer: >90 mL/min (ref >90)
Glucose, Bld: 86 mg/dL (ref 70–99)
Potassium: 3.8 mEq/L (ref 3.7–5.3)
SODIUM: 140 meq/L (ref 137–147)

## 2014-06-08 LAB — D-DIMER, QUANTITATIVE: D-Dimer, Quant: 1.92 ug/mL-FEU — ABNORMAL HIGH (ref 0.00–0.48)

## 2014-06-08 MED ORDER — OXYCODONE-ACETAMINOPHEN 5-325 MG PO TABS: 1.0000 | ORAL_TABLET | Freq: Four times a day (QID) | ORAL | Status: DC | PRN

## 2014-06-08 MED ORDER — MOXIFLOXACIN HCL 400 MG PO TABS: 400.0000 mg | ORAL_TABLET | Freq: Every day | ORAL | Status: DC

## 2014-06-08 MED ORDER — SODIUM CHLORIDE 0.9 % IV BOLUS (SEPSIS)
1000.0000 mL | Freq: Once | INTRAVENOUS | Status: AC
  Administered 2014-06-08: 1000 mL via INTRAVENOUS

## 2014-06-08 MED ORDER — DOXYCYCLINE HYCLATE 50 MG PO CAPS: 100.0000 mg | ORAL_CAPSULE | Freq: Two times a day (BID) | ORAL | Status: DC

## 2014-06-08 MED ORDER — IOHEXOL 350 MG/ML SOLN
100.0000 mL | Freq: Once | INTRAVENOUS | Status: AC | PRN
  Administered 2014-06-08: 100 mL via INTRAVENOUS

## 2014-06-08 NOTE — ED Notes (Signed)
Patient transported to CT 

## 2014-06-08 NOTE — Progress Notes (Signed)
CARE MANAGEMENT ED NOTE 06/08/2014  Patient:  [name],[name]   Account Number:  [id number]  Date Initiated:  06/08/2014  Documentation initiated by:  Edd ArbourGIBBS,KIMBERLY  Subjective/Objective Assessment:   48 yr old self pay cough for past year & weakness. Productive cough with ems. Drinks some etoh, 2 40oz this am.. Pt reports he fell a week ago and has had R chest wall pain since fall worse with palpation and coughing. Pt reports he fell     Subjective/Objective Assessment Detail:   again today and hit R chest again. Also complains of generalized lower body pain from arthritis.    No pcp Pt has an appointment [facility]14 per his card in his wallet he showed to ED CM  Brother at his bedside This Cm last spoke with pt and offered lots of resources on 01/20/14  Pt last with use of MATCH 06/19/13 CM lead provided over ride after Cm spoke with EDP Linker to see if medication could be changed from $119+ Avelox to $29 doxycycline  Pt states he is attempting to get disability Reports he has a Clinical research associatelawyer assisting him with help of [facility] Reports also [facility]  Pt voiced understanding of use of MATCH letter and no available program to offer the discounted co pay Brother noted to be speaking with a male on his cell phone when Cm speaking with pt Brother stopped to address cm & pt and states woman on phone said pt can get $3 from a friend     Action/Plan:   ED CM consulted by ED RN Mardella LaymanLindsey when pt voiced concern with getting his medication Cm reviewed EPIC notes Spoke with pt about options to include MATCH see notes below   Action/Plan Detail:   Encouraged pt to be compliant Discussed need of pcp to assist with disability process Discussed differences in pcp and therapists Discussed MATCH will not cover pain med, narcotic Rx given Voiced understanding   Anticipated DC Date:  06/08/2014     Status Recommendation to  Physician:   Result of Recommendation:    Other ED Services  Consult Working Plan    DC Planning Services  Other  Outpatient Services - Pt will follow up  PCP issues  Medication Assistance  [facility]    Choice offered to / List presented to:            Status of service:  Completed, signed off  ED Comments:   ED Comments Detail:  CM reviewed EPIC notes and chart review information CM spoke with the pt about [facility] program ($3 co pay for each Rx through [facility] program, does not include refills, 7 day expiration of MATCH letter and choice of pharmacies) Pt agreed to receive assistance from program  Pt is eligible for [facility] MATCH program (unable to find pt listed in PDMI per cardholder name inquiry)  PDMI information entered. MATCH letter completed and provided to pt.  CM updated EDP and ED RN   CM spoke with pt who confirms [place] no pcp. CM discussed and provided written information for self pay pcps, importance of pcp for f/u care, www.needymeds.org, discounted pharmacies and other [place] resources such as financial assistance, DSS and  health department  Reviewed resources for [place] self pay pcps like [facility], family medicine at [address], [facility], general medical clinics, [facility] urgent care plus others,  [facility] out patient pharmacies, housing, and other resources in [place]. Pt voiced understanding and appreciation of resources provided

## 2014-06-08 NOTE — ED Notes (Signed)
Patient presents to ED with multiple complaints Patient here with cough and weakness x 1 Patient also reports frequent falling related to his ETOH abuse Patient states that during these falls he lands on his back and/or chest Patient also with c/o generalized, chronic pain due to his arthritis Patient in NAD

## 2014-06-08 NOTE — ED Notes (Signed)
During the DC process, patient asking to speak with a case manager because he does not have any money for his Rx Case manager made aware

## 2014-06-08 NOTE — ED Notes (Signed)
Entered patient room to start PIV and obtain labs Patient asking for me to come back later, so he can use the urinal Urinal provided

## 2014-06-08 NOTE — ED Notes (Signed)
Per EMS pt complains of cough for past year and weakness. Productive cough with ems. Drinks some etoh, 2 40oz this am.. Pt reports he fell a week ago and has had R chest wall pain since fall worse with palpation and coughing. Pt reports he fell again today and hit R chest again. Also complains of generalized lower body pain from arthritis.

## 2014-06-08 NOTE — Progress Notes (Signed)
                  Dear _______________Anthony A Womack___________:  Bonita QuinYou have been approved to have your discharge prescriptions filled through our [facility] (Medication Assistance Through [facility]) program. This program allows for a one-time (no refills) 34-day supply of selected medications for a low copay amount.  The copay is $3.00 per prescription. For instance, if you have one prescription, you will pay $3.00; for two prescriptions, you pay $6.00; for three prescriptions, you pay $9.00; and so on.  Only certain pharmacies are participating in this program with [facility]. You will need to select one of the pharmacies from the attached list and take your prescriptions, this letter, and your photo ID to one of the participating pharmacies.   We are excited that you are able to use the [facility] program to get your medications. These prescriptions must be filled within 7 days of hospital discharge or they will no longer be valid for the [facility] program. Should you have any problems with your prescriptions please contact your case management team member at [phone].  Thank you,   [facility]   Participating [facility] Pharmacies  [facility]   [facility] [id number] [place]   [facility] [address] [place]   [facility] [address] [place]   [facility]   [address], [place], [place]   [id number], [place], [place]   [address], [place], [place]   [id number], [place], [place]   [id number], [place], [place]   [id number], [place], [place]   [address], [place], [place]   [address], [place], [place]   [address], [place], [place]   [id number] [place], [place], [place]  [facility]   [address], [place], [place]   [id number] [place],  [place]   [id number], [place], [place]   [id number], [place], [place]   [address], [place], [place]   [id number] [place] [address], [place], [place] [facility]   [address], [place], [place]   [address], [place], [place]   [id number] [address], [place], [place]   [address], [place], [place]   [address], [place], [place]   [address], [place], [place]   [address], [place], [place]   [address], [place], [place]   [id number] [address], [place], [place]   9[address], [place], [place]   [address], [place], [place]   [address], [place], [place]   603 [address], [place], [place]   [id number] [place], [place], [place]  Independent Pharmacies   [facility] [address], [address] [place], [place]   [facility] [address] [place], [place]   [facility] [address] [place], [place]   For continued medication needs, please contact the [facility] at  [phone].

## 2014-06-08 NOTE — Discharge Instructions (Signed)
Return to the ED with any concerns including difficulty breathing despite using albuterol every 4 hours, not drinking fluids, decreased urine output, vomiting and not able to keep down liquids or medications, decreased level of alertness/lethargy, or any other alarming symptoms °

## 2014-06-08 NOTE — ED Notes (Signed)
Patient transported to X-ray 

## 2014-06-08 NOTE — ED Notes (Signed)
Bed: WLPT2 Expected date:  Expected time:  Means of arrival:  Comments: ems 

## 2014-06-08 NOTE — ED Provider Notes (Signed)
CSN: [id number]     Arrival date & time 06/08/14  1118 History   First MD Initiated Contact with Patient 06/08/14 1140     Chief Complaint  Patient presents with  . Cough  . Fatigue     (Consider location/radiation/quality/duration/timing/severity/associated sxs/prior Treatment) HPI Pt presenting with c/o cough and fatigue.  He also c/o anterior chest wall pain after falling forward approx 2 weeks ago.  He states the soreness has been increasing.  No difficulty breathing.  States cough is chronic and unchanged, but he has had more pain in chest wall and more fatigue than usual.  Pt states he had another fall today and the pain is increased.  Both falls were mechanical- tripping over things.  No syncope.  No lightheadedness.  There are no other associated systemic symptoms, there are no other alleviating or modifying factors.   Past Medical History  Diagnosis Date  . Hypertension   . Shortness of breath   . COPD (chronic obstructive pulmonary disease)   . Depression   . Alcohol abuse    Past Surgical History  Procedure Laterality Date  . Hemorrhoid surgery    . Mandible fracture surgery    . Back surgery    . Hernia repair     Family History  Problem Relation Age of Onset  . Hypertension Mother   . Hypertension Father   . Hypertension Brother    History  Substance Use Topics  . Smoking status: Current Every Day Smoker -- 2.00 packs/day    Types: Cigarettes  . Smokeless tobacco: Never Used  . Alcohol Use: 6.0 oz/week    10 Cans of beer per week     Comment: 6- 40's daily     Review of Systems ROS reviewed and all otherwise negative except for mentioned in HPI    Allergies  Review of patient's allergies indicates no known allergies.  Home Medications   Prior to Admission medications   Medication Sig Start Date End Date Taking? Authorizing Provider  albuterol (PROVENTIL HFA;VENTOLIN HFA) 108 (90 BASE) MCG/ACT inhaler Inhale 2 puffs into the lungs every 6 (six)  hours as needed for wheezing or shortness of breath (wheezing).  06/19/13  Yes Rodolph BongEvan S Corey, MD  Aspirin-Salicylamide-Caffeine [facility](BC HEADACHE POWDER PO) Take 1 packet by mouth 2 (two) times daily as needed (pain).    Yes Historical Provider, MD  doxycycline (VIBRAMYCIN) 50 MG capsule Take 2 capsules (100 mg total) by mouth 2 (two) times daily. 06/08/14   Ethelda ChickMartha K Linker, MD  oxyCODONE-acetaminophen (PERCOCET/ROXICET) 5-325 MG per tablet Take 1-2 tablets by mouth every 6 (six) hours as needed for severe pain. 06/08/14   Ethelda ChickMartha K Linker, MD   BP 119/77  Pulse 81  Temp(Src) 98.1 F (36.7 C) (Oral)  Resp 16  SpO2 98% Vitals reviewed Physical Exam Physical Examination: General appearance - alert, tired appearing, and in no distress Mental status - alert, oriented to person, place, and time Eyes - no conjunctival injection, no scleral icterus Mouth - mucous membranes moist, pharynx normal without lesions Chest - clear to auscultation, no wheezes, rales or rhonchi, symmetric air entry, no crepitus, ttp over right anterior chest wall, no bruising or skin lesions Heart - normal rate, regular rhythm, normal S1, S2, no murmurs, rubs, clicks or gallops Abdomen - soft, nontender, nondistended, no masses or organomegaly Extremities - peripheral pulses normal, no pedal edema, no clubbing or cyanosis Skin - normal coloration and turgor, no rashes  ED Course  Procedures (including critical  care time) Labs Review Labs Reviewed  CBC - Abnormal; Notable for the following:    WBC 3.9 (*)    RBC 3.59 (*)    Hemoglobin 12.9 (*)    HCT 36.5 (*)    MCV 101.7 (*)    MCH 35.9 (*)    Platelets 148 (*)    All other components within normal limits  BASIC METABOLIC PANEL - Abnormal; Notable for the following:    BUN 4 (*)    Creatinine, Ser 0.48 (*)    Anion gap 17 (*)    All other components within normal limits  D-DIMER, QUANTITATIVE - Abnormal; Notable for the following:    D-Dimer, Quant 1.92 (*)     All other components within normal limits    Imaging Review Dg Chest 2 View  06/08/2014   CLINICAL DATA:  Productive cough for 1 year.  COPD.  EXAM: CHEST  2 VIEW  COMPARISON:  PA and lateral chest 06/19/2013.  FINDINGS: Lung volumes are lower than on the comparison study but there is marked enlargement the retrosternal airspace attenuation of the pulmonary vasculature consistent with emphysema. Streaky left basilar airspace opacity is identified. Heart size is normal. No pneumothorax or pleural effusion.  IMPRESSION: Mild, streaky left basilar airspace opacity is likely due to atelectasis in this low volume chest.  Emphysema.   Electronically Signed   By: Drusilla Kannerhomas  Dalessio M.D.   On: 06/08/2014 12:46   Ct Angio Chest Pe W/cm &/or Wo Cm  06/08/2014   CLINICAL DATA:  Cough, back pain and elevated D-dimer.  EXAM: CT ANGIOGRAPHY CHEST WITH CONTRAST  TECHNIQUE: Multidetector CT imaging of the chest was performed using the standard protocol during bolus administration of intravenous contrast. Multiplanar CT image reconstructions and MIPs were obtained to evaluate the vascular anatomy.  CONTRAST:  100mL OMNIPAQUE IOHEXOL 350 MG/ML SOLN  COMPARISON:  Chest x-ray earlier today.  FINDINGS: The pulmonary arteries are well opacified. There is no evidence of pulmonary embolism. The thoracic aorta is also well opacified and shows normal caliber with no evidence of aneurysmal disease or dissection.  Lungs show evidence of emphysematous disease, predominantly in the upper lobes bilaterally. Mild posterior bibasilar opacities are present, right greater than left, felt to most likely represent atelectasis. Subtle infiltrate cannot be excluded especially at the right base.  The heart size is normal. No pleural or pericardial fluid identified. No enlarged lymph nodes are seen. The visualized airways are normally patent. Upper abdominal structures are unremarkable. The bony thorax has a normal appearance by CT. No evidence of  pulmonary edema, pneumothorax or pulmonary nodule.  Review of the MIP images confirms the above findings.  IMPRESSION: 1. No evidence of pulmonary embolism. Normal appearance of thoracic aorta. 2. Emphysematous lung disease with bibasilar atelectasis. Subtle infiltrate cannot be excluded, especially at the right lung base.   Electronically Signed   By: Irish LackGlenn  Yamagata M.D.   On: 06/08/2014 14:51     EKG Interpretation   Date/Time:  Monday June 08 2014 11:32:34 EDT Ventricular Rate:  84 PR Interval:  178 QRS Duration: 101 QT Interval:  369 QTC Calculation: 436 R Axis:   63 Text Interpretation:  Sinus rhythm Borderline low voltage, extremity leads  ST elev, probable normal early repol pattern No significant change since  last tracing Confirmed by [facility]  MD, MARTHA [phone]([id number]) on 06/08/2014  11:35:38 AM      MDM   Final diagnoses:  Chest wall pain  Community acquired pneumonia  Chronic obstructive  pulmonary disease, unspecified COPD, unspecified chronic bronchitis type    Pt presenting with chest wall pain, chronic cough.  No PE on CT angio.  Possible pneumonia.  Will start on antibiotics- given rx for moxifloxacin, but he is not able to afford, so changed to doxycycline.  Discharged with strict return precautions.  Pt agreeable with plan.    Ethelda Chick, MD 06/08/14 [phone]

## 2014-06-29 ENCOUNTER — Emergency Department (HOSPITAL_COMMUNITY): Payer: Self-pay

## 2014-06-29 ENCOUNTER — Encounter (HOSPITAL_COMMUNITY): Payer: Self-pay | Admitting: *Deleted

## 2014-06-29 ENCOUNTER — Emergency Department (HOSPITAL_COMMUNITY)
Admission: EM | Admit: 2014-06-29 | Discharge: 2014-06-29 | Disposition: A | Discharge status: Other Acute Inpatient Hospital | Payer: Federal, State, Local not specified - Other | Attending: Emergency Medicine | Admitting: Emergency Medicine

## 2014-06-29 ENCOUNTER — Observation Stay (HOSPITAL_COMMUNITY)
Admission: AD | Admit: 2014-06-29 | Discharge: 2014-06-30 | Disposition: A | Discharge status: Home / Self Care | Payer: Federal, State, Local not specified - Other | Source: Intra-hospital | Attending: Psychiatry | Admitting: Psychiatry

## 2014-06-29 DIAGNOSIS — F10930 Alcohol use, unspecified with withdrawal, uncomplicated: Secondary | ICD-10-CM

## 2014-06-29 DIAGNOSIS — I10 Essential (primary) hypertension: Secondary | ICD-10-CM | POA: Insufficient documentation

## 2014-06-29 DIAGNOSIS — R05 Cough: Secondary | ICD-10-CM

## 2014-06-29 DIAGNOSIS — F10230 Alcohol dependence with withdrawal, uncomplicated: Secondary | ICD-10-CM | POA: Insufficient documentation

## 2014-06-29 DIAGNOSIS — Z8659 Personal history of other mental and behavioral disorders: Secondary | ICD-10-CM | POA: Insufficient documentation

## 2014-06-29 DIAGNOSIS — F1024 Alcohol dependence with alcohol-induced mood disorder: Principal | ICD-10-CM | POA: Insufficient documentation

## 2014-06-29 DIAGNOSIS — R000 Tachycardia, unspecified: Secondary | ICD-10-CM | POA: Insufficient documentation

## 2014-06-29 DIAGNOSIS — J449 Chronic obstructive pulmonary disease, unspecified: Secondary | ICD-10-CM | POA: Insufficient documentation

## 2014-06-29 DIAGNOSIS — Z720 Tobacco use: Secondary | ICD-10-CM | POA: Insufficient documentation

## 2014-06-29 DIAGNOSIS — Z590 Homelessness: Secondary | ICD-10-CM | POA: Insufficient documentation

## 2014-06-29 DIAGNOSIS — Z79899 Other long term (current) drug therapy: Secondary | ICD-10-CM | POA: Insufficient documentation

## 2014-06-29 DIAGNOSIS — F10251 Alcohol dependence with alcohol-induced psychotic disorder with hallucinations: Secondary | ICD-10-CM

## 2014-06-29 DIAGNOSIS — J441 Chronic obstructive pulmonary disease with (acute) exacerbation: Secondary | ICD-10-CM | POA: Insufficient documentation

## 2014-06-29 DIAGNOSIS — R6883 Chills (without fever): Secondary | ICD-10-CM | POA: Insufficient documentation

## 2014-06-29 DIAGNOSIS — R059 Cough, unspecified: Secondary | ICD-10-CM

## 2014-06-29 LAB — CBC WITH DIFFERENTIAL/PLATELET
Basophils Absolute: 0 10*3/uL (ref 0.0–0.1)
Basophils Relative: 1 % (ref 0–1)
Eosinophils Absolute: 0.1 10*3/uL (ref 0.0–0.7)
Eosinophils Relative: 1 % (ref 0–5)
HEMATOCRIT: 34 % — AB (ref 39.0–52.0)
HEMOGLOBIN: 12.3 g/dL — AB (ref 13.0–17.0)
LYMPHS PCT: 15 % (ref 12–46)
Lymphs Abs: 0.6 10*3/uL — ABNORMAL LOW (ref 0.7–4.0)
MCH: 36 pg — ABNORMAL HIGH (ref 26.0–34.0)
MCHC: 36.2 g/dL — ABNORMAL HIGH (ref 30.0–36.0)
MCV: 99.4 fL (ref 78.0–100.0)
MONO ABS: 0.7 10*3/uL (ref 0.1–1.0)
MONOS PCT: 15 % — AB (ref 3–12)
NEUTROS ABS: 2.9 10*3/uL (ref 1.7–7.7)
NEUTROS PCT: 68 % (ref 43–77)
Platelets: 155 10*3/uL (ref 150–400)
RBC: 3.42 MIL/uL — AB (ref 4.22–5.81)
RDW: 14.2 % (ref 11.5–15.5)
WBC: 4.3 10*3/uL (ref 4.0–10.5)

## 2014-06-29 LAB — BASIC METABOLIC PANEL
Anion gap: 18 — ABNORMAL HIGH (ref 5–15)
BUN: 6 mg/dL (ref 6–23)
CHLORIDE: 101 meq/L (ref 96–112)
CO2: 22 meq/L (ref 19–32)
Calcium: 9.3 mg/dL (ref 8.4–10.5)
Creatinine, Ser: 0.53 mg/dL (ref 0.50–1.35)
GFR calc Af Amer: >90 mL/min (ref >90)
GFR calc non Af Amer: >90 mL/min (ref >90)
Glucose, Bld: 71 mg/dL (ref 70–99)
POTASSIUM: 3.8 meq/L (ref 3.7–5.3)
Sodium: 141 mEq/L (ref 137–147)

## 2014-06-29 LAB — ETHANOL: Alcohol, Ethyl (B): 31 mg/dL — ABNORMAL HIGH (ref 0–11)

## 2014-06-29 MED ORDER — FOLIC ACID 1 MG PO TABS
1.0000 mg | ORAL_TABLET | Freq: Every day | ORAL | Status: DC
  Administered 2014-06-30: 1 mg via ORAL
  Filled 2014-06-29 (×2): qty 1

## 2014-06-29 MED ORDER — THIAMINE HCL 100 MG/ML IJ SOLN: 100.0000 mg | Freq: Every day | INTRAMUSCULAR | Status: DC

## 2014-06-29 MED ORDER — LORAZEPAM 1 MG PO TABS
0.0000 mg | ORAL_TABLET | Freq: Four times a day (QID) | ORAL | Status: DC
  Administered 2014-06-30: 1 mg via ORAL
  Administered 2014-06-30: 2 mg via ORAL
  Filled 2014-06-29 (×2): qty 2
  Filled 2014-06-29: qty 1

## 2014-06-29 MED ORDER — PREDNISONE 10 MG PO TABS
10.0000 mg | ORAL_TABLET | Freq: Every day | ORAL | Status: DC
  Filled 2014-06-29: qty 1

## 2014-06-29 MED ORDER — PREDNISONE 10 MG PO TABS
10.0000 mg | ORAL_TABLET | Freq: Four times a day (QID) | ORAL | Status: DC
  Filled 2014-06-29: qty 1

## 2014-06-29 MED ORDER — ALBUTEROL SULFATE HFA 108 (90 BASE) MCG/ACT IN AERS
2.0000 | INHALATION_SPRAY | Freq: Four times a day (QID) | RESPIRATORY_TRACT | Status: DC | PRN
  Administered 2014-06-29: 2 via RESPIRATORY_TRACT
  Filled 2014-06-29: qty 6.7

## 2014-06-29 MED ORDER — PREDNISONE 20 MG PO TABS
60.0000 mg | ORAL_TABLET | Freq: Once | ORAL | Status: AC
  Administered 2014-06-29: 60 mg via ORAL
  Filled 2014-06-29: qty 3

## 2014-06-29 MED ORDER — PREDNISONE 10 MG PO TABS: 10.0000 mg | ORAL_TABLET | Freq: Four times a day (QID) | ORAL | Status: DC

## 2014-06-29 MED ORDER — VITAMIN B-1 100 MG PO TABS
100.0000 mg | ORAL_TABLET | Freq: Every day | ORAL | Status: DC
  Administered 2014-06-29: 100 mg via ORAL
  Filled 2014-06-29: qty 1

## 2014-06-29 MED ORDER — IPRATROPIUM-ALBUTEROL 0.5-2.5 (3) MG/3ML IN SOLN: 3.0000 mL | Freq: Four times a day (QID) | RESPIRATORY_TRACT | Status: DC | PRN

## 2014-06-29 MED ORDER — PREDNISONE 10 MG PO TABS: 10.0000 mg | ORAL_TABLET | Freq: Two times a day (BID) | ORAL | Status: DC

## 2014-06-29 MED ORDER — ADULT MULTIVITAMIN W/MINERALS CH
1.0000 | ORAL_TABLET | Freq: Every day | ORAL | Status: DC
  Administered 2014-06-29: 1 via ORAL
  Filled 2014-06-29: qty 1

## 2014-06-29 MED ORDER — PREDNISONE 10 MG PO TABS: 10.0000 mg | ORAL_TABLET | Freq: Once | ORAL | Status: DC

## 2014-06-29 MED ORDER — PREDNISONE 10 MG PO TABS: 10.0000 mg | ORAL_TABLET | Freq: Three times a day (TID) | ORAL | Status: DC

## 2014-06-29 MED ORDER — LORAZEPAM 1 MG PO TABS: 1.0000 mg | ORAL_TABLET | Freq: Four times a day (QID) | ORAL | Status: DC | PRN

## 2014-06-29 MED ORDER — HALOPERIDOL 5 MG PO TABS
5.0000 mg | ORAL_TABLET | Freq: Once | ORAL | Status: AC
  Administered 2014-06-29: 5 mg via ORAL
  Filled 2014-06-29 (×2): qty 1

## 2014-06-29 MED ORDER — LORAZEPAM 1 MG PO TABS
2.0000 mg | ORAL_TABLET | Freq: Once | ORAL | Status: AC
  Administered 2014-06-29: 2 mg via ORAL
  Filled 2014-06-29: qty 2

## 2014-06-29 MED ORDER — PREDNISONE 10 MG PO TABS
10.0000 mg | ORAL_TABLET | Freq: Every day | ORAL | Status: DC
  Administered 2014-06-30 (×2): 10 mg via ORAL
  Filled 2014-06-29: qty 1
  Filled 2014-06-29: qty 2
  Filled 2014-06-29 (×4): qty 1

## 2014-06-29 MED ORDER — LORAZEPAM 1 MG PO TABS: 0.0000 mg | ORAL_TABLET | Freq: Four times a day (QID) | ORAL | Status: DC

## 2014-06-29 MED ORDER — LORAZEPAM 1 MG PO TABS
0.0000 mg | ORAL_TABLET | Freq: Two times a day (BID) | ORAL | Status: DC
  Administered 2014-06-30: 2 mg via ORAL

## 2014-06-29 MED ORDER — LORAZEPAM 1 MG PO TABS
1.0000 mg | ORAL_TABLET | Freq: Once | ORAL | Status: AC
  Administered 2014-06-29: 1 mg via ORAL
  Filled 2014-06-29: qty 1

## 2014-06-29 MED ORDER — LORAZEPAM 2 MG/ML IJ SOLN: 1.0000 mg | Freq: Four times a day (QID) | INTRAMUSCULAR | Status: DC | PRN

## 2014-06-29 MED ORDER — LORAZEPAM 1 MG PO TABS: 0.0000 mg | ORAL_TABLET | Freq: Two times a day (BID) | ORAL | Status: DC

## 2014-06-29 MED ORDER — OXYCODONE-ACETAMINOPHEN 5-325 MG PO TABS
2.0000 | ORAL_TABLET | Freq: Once | ORAL | Status: AC
  Administered 2014-06-29: 2 via ORAL
  Filled 2014-06-29: qty 2

## 2014-06-29 MED ORDER — VITAMIN B-1 100 MG PO TABS
100.0000 mg | ORAL_TABLET | Freq: Every day | ORAL | Status: DC
  Administered 2014-06-30: 100 mg via ORAL
  Filled 2014-06-29 (×3): qty 1

## 2014-06-29 MED ORDER — IPRATROPIUM-ALBUTEROL 0.5-2.5 (3) MG/3ML IN SOLN
3.0000 mL | Freq: Once | RESPIRATORY_TRACT | Status: AC
  Administered 2014-06-29: 3 mL via RESPIRATORY_TRACT
  Filled 2014-06-29: qty 3

## 2014-06-29 MED ORDER — ALBUTEROL SULFATE HFA 108 (90 BASE) MCG/ACT IN AERS
2.0000 | INHALATION_SPRAY | Freq: Four times a day (QID) | RESPIRATORY_TRACT | Status: DC | PRN
  Administered 2014-06-30: 2 via RESPIRATORY_TRACT
  Filled 2014-06-29: qty 6.7

## 2014-06-29 MED ORDER — PREDNISONE 10 MG PO TABS: 40.0000 mg | ORAL_TABLET | Freq: Every day | ORAL | Status: DC

## 2014-06-29 MED ORDER — ADULT MULTIVITAMIN W/MINERALS CH
1.0000 | ORAL_TABLET | Freq: Every day | ORAL | Status: DC
  Administered 2014-06-30: 1 via ORAL
  Filled 2014-06-29 (×3): qty 1

## 2014-06-29 MED ORDER — PREDNISONE 50 MG PO TABS: 50.0000 mg | ORAL_TABLET | Freq: Every day | ORAL | Status: DC

## 2014-06-29 MED ORDER — FOLIC ACID 1 MG PO TABS
1.0000 mg | ORAL_TABLET | Freq: Every day | ORAL | Status: DC
  Administered 2014-06-29: 1 mg via ORAL
  Filled 2014-06-29: qty 1

## 2014-06-29 NOTE — Progress Notes (Signed)
Pt admitted to Observation Unit with Alcohol Abuse and Depression. Pt alert, oriented and cooperative. Pt anxious and tremulous. Denies SI/HI, -A/[place]hall @ present but reports "Sometimes it sound like someone walking up on me and I say hello".  Pt denies pain or disomfort at present but states he has arthritis, weakness in his legs and use a cane for long walks or standing. Spencer NP notified of pt withdrawal symptoms and elevated pulse. Orders received see MAR. Will monitor closely and evaluate for stabilization.

## 2014-06-29 NOTE — ED Notes (Signed)
Pt states he couldn't go to sleep tonight d/t his coughing, states he's had this cough since being diagnosed w/ COPD x 3-4 years ago, pt states the cough is dry, pt states he was here about a month ago also and diagnosed w/ pna, pt resting in bed in no distress.

## 2014-06-29 NOTE — BH Assessment (Signed)
[facility] The accepting provider is Dr. Jama Flavorsobos. Nursing report [phone]

## 2014-06-29 NOTE — ED Notes (Signed)
Bed: WA21 Expected date:  Expected time:  Means of arrival:  Comments: Difficulty breathing/Hx of COPD

## 2014-06-29 NOTE — Plan of Care (Signed)
[facility] Observation Crisis Plan  Reason for Crisis Plan:  Substance Abuse   Plan of Care:  Referral for Substance Abuse  Family Support:    Launa FlightBarbara Jackson sister  Current Living Environment:   homeless  Insurance:   Hospital Account    Name Acct ID Class Status Primary Coverage   Judithe ModestWomack, Anuar A [id number] BEHAVIORAL HEALTH OBSERVATION Open None        Guarantor Account (for Hospital Account [id number])    Name Relation to Pt Service Area Active? Acct Type   Candace CruiseWomack, Orenthal A Self [facility] Yes [facility]   Address Phone       [address] [place], [place] [id number] [phone](H)          Coverage Information (for Hospital Account [id number])    Not on file      Legal Guardian:   none  Primary Care Provider:  No PCP Per Patient  Current Outpatient Providers:  None Psychiatrist:   none  Counselor/Therapist:   none  Compliant with Medications: none  Additional Information:   Celene KrasRobinson, Maiya Kates G 11/9/20158:35 PM

## 2014-06-29 NOTE — Discharge Instructions (Signed)
Return to the emergency room with worsening of symptoms, new symptoms or with symptoms that are concerning.

## 2014-06-29 NOTE — ED Notes (Signed)
Per EMS pt was sitting outside on sidewalk, called EMS, told EMS when he lays down flat he has trouble breathing, x 3-4 years since he was diagnosed w/ COPD, pt also complaining of cough, lungs clear, no chest pain, no n/v/d.

## 2014-06-29 NOTE — ED Notes (Signed)
Patient transported to X-ray 

## 2014-06-29 NOTE — ED Provider Notes (Signed)
CSN: [id number]     Arrival date & time 06/29/14  0535 History   First MD Initiated Contact with Patient 06/29/14 [phone]     Chief Complaint  Patient presents with  . Cough  . Drug / Alcohol Assessment     (Consider location/radiation/quality/duration/timing/severity/associated sxs/prior Treatment) HPI  [name] is a 48 y.o. male with PMH of COPD, treated with doxycycline for PNA 06/08/14 presenting with persistent cough that keeps him up at night. Pt cough productive of clear mucous with green hue. Cough worse with lying down. Pt states he has taken his home inhalers last night without relief. Pt found by EMS because the was sitting outside on the side walk and called due to trouble breathing. Pt denies CP, SOB in ED. Pt denies fevers and endorses some chills. Pt reports compliance with abx. Pt has not taken any inhalers this morning. Pt admits to drinking one can of beer last night but smells strongly of alcohol. Pt denies chest pain, back pain, sinus congestion, rhinorrhea, sore throat. No recent trauma, surgery, history of malignancy, PE or DVT or unilateral leg swelling. Pt with elevated d-dimer and CT angio without signs of PE with evidence of PNA in October. No home oxygen.   Past Medical History  Diagnosis Date  . Hypertension   . Shortness of breath   . COPD (chronic obstructive pulmonary disease)   . Depression   . Alcohol abuse    Past Surgical History  Procedure Laterality Date  . Hemorrhoid surgery    . Mandible fracture surgery    . Back surgery    . Hernia repair     Family History  Problem Relation Age of Onset  . Hypertension Mother   . Hypertension Father   . Hypertension Brother    History  Substance Use Topics  . Smoking status: Current Every Day Smoker -- 2.00 packs/day    Types: Cigarettes  . Smokeless tobacco: Never Used  . Alcohol Use: 6.0 oz/week    10 Cans of beer per week     Comment: 6- 40's daily     Review of Systems  Constitutional:  Negative for fever and chills.  HENT: Negative for congestion and rhinorrhea.   Eyes: Negative for visual disturbance.  Respiratory: Positive for cough, chest tightness and shortness of breath.   Cardiovascular: Negative for chest pain and palpitations.  Gastrointestinal: Negative for nausea, vomiting and diarrhea.  Genitourinary: Negative for dysuria and hematuria.  Musculoskeletal: Negative for back pain and gait problem.  Skin: Negative for rash.  Neurological: Negative for weakness and headaches.      Allergies  Review of patient's allergies indicates no known allergies.  Home Medications   Prior to Admission medications   Medication Sig Start Date End Date Taking? Authorizing Provider  albuterol (PROVENTIL HFA;VENTOLIN HFA) 108 (90 BASE) MCG/ACT inhaler Inhale 2 puffs into the lungs every 6 (six) hours as needed for wheezing or shortness of breath (wheezing).  06/19/13  Yes Rodolph BongEvan S Corey, MD  Aspirin-Salicylamide-Caffeine [facility](BC HEADACHE POWDER PO) Take 1 packet by mouth 2 (two) times daily as needed (pain).    Yes Historical Provider, MD  oxyCODONE-acetaminophen (PERCOCET/ROXICET) 5-325 MG per tablet Take 1-2 tablets by mouth every 6 (six) hours as needed for severe pain. 06/08/14  Yes Ethelda ChickMartha K Linker, MD  doxycycline (VIBRAMYCIN) 50 MG capsule Take 2 capsules (100 mg total) by mouth 2 (two) times daily. 06/08/14   Ethelda ChickMartha K Linker, MD   BP 127/84 mmHg  Pulse  66  Temp(Src) 98.7 F (37.1 C) (Oral)  Resp 20  Ht 5\' 6"  (1.676 m)  Wt 135 lb (61.236 kg)  BMI 21.80 kg/m2  SpO2 99% Physical Exam  Constitutional: He appears well-developed and well-nourished. No distress.  Pt with strong odor of alcohol  HENT:  Head: Normocephalic and atraumatic.  Eyes: Conjunctivae and EOM are normal. Right eye exhibits no discharge. Left eye exhibits no discharge.  Neck: Neck supple. No JVD present.  Cardiovascular: Normal rate, regular rhythm and normal heart sounds.   No leg swelling or  tenderness. Negative Homan's sign.  Pulmonary/Chest:  Diminished breath sounds bilaterally. No wheezing. Minimal accessory muscle use. Pt in no respiratory distress. Equal chest excursion.   Abdominal: Soft. Bowel sounds are normal. He exhibits no distension. There is no tenderness.  Neurological: He is alert. He exhibits normal muscle tone. Coordination normal.  Skin: Skin is warm and dry. He is not diaphoretic.  Nursing note and vitals reviewed.   ED Course  Procedures (including critical care time) Labs Review Labs Reviewed  CBC WITH DIFFERENTIAL - Abnormal; Notable for the following:    RBC 3.42 (*)    Hemoglobin 12.3 (*)    HCT 34.0 (*)    MCH 36.0 (*)    MCHC 36.2 (*)    Lymphs Abs 0.6 (*)    Monocytes Relative 15 (*)    All other components within normal limits  BASIC METABOLIC PANEL - Abnormal; Notable for the following:    Anion gap 18 (*)    All other components within normal limits  ETHANOL - Abnormal; Notable for the following:    Alcohol, Ethyl (B) 31 (*)    All other components within normal limits    Imaging Review Dg Chest 2 View  06/29/2014   CLINICAL DATA:  Cough and shortness of breath.  History of COPD.  EXAM: CHEST  2 VIEW  COMPARISON:  CT chest and PA and lateral chest 06/08/2014.  FINDINGS: Again seen is bilateral pulmonary hyperexpansion with attenuation of the pulmonary vasculature consistent with emphysema. The lungs are clear. Heart size is normal. No pneumothorax or pleural effusion.  IMPRESSION: Emphysema without acute disease.   Electronically Signed   By: Drusilla Kannerhomas  Dalessio M.D.   On: 06/29/2014 06:20     EKG Interpretation   Date/Time:  Monday June 29 2014 05:38:37 EST Ventricular Rate:  93 PR Interval:    QRS Duration: 95 QT Interval:  355 QTC Calculation: 441 R Axis:   74 Text Interpretation:  Sinus rhythm Artifact similar to EKG from 06/06/13  Confirmed by BELFI  MD, MELANIE ([id number]) on 06/29/2014 6:16:37 AM      MDM   Final  diagnoses:  Cough  COPD exacerbation  Alcohol withdrawal, uncomplicated   Pt with history of PNA last month and COPD with persistent cough productive of thin mucous,  intermittent SOB. VSS. Pt with good air movement with improvement after one duoneb. CXR clear. Pt likely with COPD exacerbation. Pt ambulated with O2 stat above 97% on RA. However, Pt felt weak and tremulous and had tachycardia. Pt ETOH 31 and I suspect pt in beginning of alcohol withdrawal. Pt endorses drinking 4-8 40oz cans of beer daily. Pt went through detox two years ago at a [place] facility. Pt interested in detox today. CIWA consult and protocol ordered. Patient medically cleared.  Discussed all results and patient verbalizes understanding and agrees with plan.  This is a shared patient. This patient was discussed with the physician who saw  and evaluated the patient and agrees with the plan.      Louann Sjogren, PA-C 06/30/14 1005  Richardean Canal, MD 06/30/14 [phone]

## 2014-06-29 NOTE — ED Notes (Addendum)
97% on RA. Pt with NAD. Walked unit

## 2014-06-29 NOTE — Consult Note (Signed)
[facility] Face-to-Face Psychiatry Consult   Reason for Consult:  Hallucinations/alcohol abuse  Referring Physician:  ED Physician [name] is an 48 y.o. male. Total Time spent with patient: 35 minutes   Assessment: AXIS I:  Alcohol Dependence, Psychosis NOS AXIS II:  Deferred AXIS III:   Past Medical History  Diagnosis Date  . Hypertension   . Shortness of breath   . COPD (chronic obstructive pulmonary disease)   . Depression   . Alcohol abuse    AXIS IV:  homelessnes, unemployment, poor support network AXIS V:  50-55   Plan:  No evidence of imminent risk to self or others at present.    Subjective:   [name] is a 48 y.o. male patient admitted with complaints of  Cough  and chest pain upon inspiration.  HPI:   Patient is a 48 year old man. He reports he is currently homeless. He presented to ED earlier today reporting coughing, weakness and chest wall pain, worsened by palpation and coughing. A Chest X Ray showed emphysema but no acute issues Patient also reports significant ETOH consumption, and states he has been drinking 3-4 40 ounce beers " for a long time". Last drank yesterday. He also reports intermittent , rather vague auditory hallucinations, telling him" Ovid I am here". Denies command hallucinations. Last hallucinatory experience " a couple of days ago". Currently does not appear internally preoccupied. Patient denies depression and does not endorse significant neuro-vegetative symptoms of depression- no anhedonia, no sadness, good sense of self esteem, no SI. Psychiatric History - see below ( underlined).  Substance Abuse Hx-He reports a history of alcohol abuse, drinking daily recently. Reports a remote history of cocaine abuse, not recently. Denies DTs or seizures .  Social Hx- homeless, unemployed, limited support network, separated, no bio children, denies legal issues  Family Hx- parents deceased, has siblings, (+) alcoholism in several members of  family. No suicides in family  HPI Elements:   Chronic Alcohol Abuse, progressing to pattern of dependence, intermittent auditory hallucinations.  Past Psychiatric History:- patient states he has a history of previous psychiatric admissions for alcohol detoxification. He states he has a history of one suicide attempt in 2002. He denies any PTSD or history of violence. He denies history of mania.  Past Medical History  Diagnosis Date  . Hypertension   . Shortness of breath   . COPD (chronic obstructive pulmonary disease)   . Depression   . Alcohol abuse     reports that he has been smoking Cigarettes.  He has been smoking about 2.00 packs per day. He has never used smokeless tobacco. He reports that he drinks about 6.0 oz of alcohol per week. He reports that he uses illicit drugs (Cocaine and Marijuana). Family History  Problem Relation Age of Onset  . Hypertension Mother   . Hypertension Father   . Hypertension Brother            Allergies:  No Known Allergies   Objective: Blood pressure 135/90, pulse 103, temperature 98.6 F (37 C), temperature source Oral, resp. rate 18, height _0  (1.676 m), weight 61.236 kg (135 lb), SpO2 96 %.Body mass index is 21.8 kg/(m^2). Results for orders placed or performed during the hospital encounter of 06/29/14 (from the past 72 hour(s))  CBC with Differential     Status: Abnormal   Collection Time: 06/29/14  7:03 AM  Result Value Ref Range   WBC 4.3 4.0 - 10.5 K/uL   RBC 3.42 (  L) 4.22 - 5.81 MIL/uL   Hemoglobin 12.3 (L) 13.0 - 17.0 g/dL   HCT 34.0 (L) 39.0 - 52.0 %   MCV 99.4 78.0 - 100.0 fL   MCH 36.0 (H) 26.0 - 34.0 pg   MCHC 36.2 (H) 30.0 - 36.0 g/dL   RDW 14.2 11.5 - 15.5 %   Platelets 155 150 - 400 K/uL   Neutrophils Relative % 68 43 - 77 %   Neutro Abs 2.9 1.7 - 7.7 K/uL   Lymphocytes Relative 15 12 - 46 %   Lymphs Abs 0.6 (L) 0.7 - 4.0 K/uL   Monocytes Relative 15 (H) 3 - 12 %   Monocytes Absolute 0.7 0.1 - 1.0 K/uL    Eosinophils Relative 1 0 - 5 %   Eosinophils Absolute 0.1 0.0 - 0.7 K/uL   Basophils Relative 1 0 - 1 %   Basophils Absolute 0.0 0.0 - 0.1 K/uL  Basic metabolic panel     Status: Abnormal   Collection Time: 06/29/14  7:03 AM  Result Value Ref Range   Sodium 141 137 - 147 mEq/L   Potassium 3.8 3.7 - 5.3 mEq/L   Chloride 101 96 - 112 mEq/L   CO2 22 19 - 32 mEq/L   Glucose, Bld 71 70 - 99 mg/dL   BUN 6 6 - 23 mg/dL   Creatinine, Ser 0.53 0.50 - 1.35 mg/dL   Calcium 9.3 8.4 - 10.5 mg/dL   GFR calc non Af Amer >90 >90 mL/min   GFR calc Af Amer >90 >90 mL/min    Comment: (NOTE) The eGFR has been calculated using the CKD EPI equation. This calculation has not been validated in all clinical situations. eGFR's persistently <90 mL/min signify possible Chronic Kidney Disease.    Anion gap 18 (H) 5 - 15  Ethanol     Status: Abnormal   Collection Time: 06/29/14  8:45 AM  Result Value Ref Range   Alcohol, Ethyl (B) 31 (H) 0 - 11 mg/dL    Comment:        LOWEST DETECTABLE LIMIT FOR SERUM ALCOHOL IS 11 mg/dL FOR MEDICAL PURPOSES ONLY    Labs are reviewed and are pertinent for BAL 31   Current Facility-Administered Medications  Medication Dose Route Frequency Provider Last Rate Last Dose  . folic acid (FOLVITE) tablet 1 mg  1 mg Oral Daily Pura Spice, PA-C   1 mg at 06/29/14 1103  . ipratropium-albuterol (DUONEB) 0.5-2.5 (3) MG/3ML nebulizer solution 3 mL  3 mL Nebulization Q6H PRN Pura Spice, PA-C      . LORazepam (ATIVAN) tablet 1 mg  1 mg Oral Q6H PRN Pura Spice, PA-C       Or  . LORazepam (ATIVAN) injection 1 mg  1 mg Intravenous Q6H PRN Pura Spice, PA-C      . LORazepam (ATIVAN) tablet 0-4 mg  0-4 mg Oral Q6H Pura Spice, PA-C   0 mg at 06/29/14 1054   Followed by  . [START ON 07/01/2014] LORazepam (ATIVAN) tablet 0-4 mg  0-4 mg Oral Q12H Pura Spice, PA-C      . multivitamin with minerals tablet 1 tablet  1 tablet Oral Daily Pura Spice, PA-C   1 tablet at 06/29/14 1103  . [START ON 06/30/2014] predniSONE (DELTASONE) tablet 50 mg  50 mg Oral Daily [place], PA-C      . thiamine (VITAMIN B-1) tablet 100 mg  100 mg Oral Daily [place]  L Creech, PA-C   100 mg at 06/29/14 1103   Or  . thiamine (B-1) injection 100 mg  100 mg Intravenous Daily Pura Spice, PA-C       Current Outpatient Prescriptions  Medication Sig Dispense Refill  . albuterol (PROVENTIL HFA;VENTOLIN HFA) 108 (90 BASE) MCG/ACT inhaler Inhale 2 puffs into the lungs every 6 (six) hours as needed for wheezing or shortness of breath (wheezing).     . Aspirin-Salicylamide-Caffeine (BC HEADACHE POWDER PO) Take 1 packet by mouth 2 (two) times daily as needed (pain).     Marland Kitchen oxyCODONE-acetaminophen (PERCOCET/ROXICET) 5-325 MG per tablet Take 1-2 tablets by mouth every 6 (six) hours as needed for severe pain. 6 tablet 0  . doxycycline (VIBRAMYCIN) 50 MG capsule Take 2 capsules (100 mg total) by mouth 2 (two) times daily. 28 capsule 0    Psychiatric Specialty Exam: ROS- poor sleep, poor appetite,  No fever,chest pain upon coughing, SOB upon exertion, no melenas endorsed, describes lower extremity pain from "arthritis"    Blood pressure 135/90, pulse 103, temperature 98.6 F (37 C), temperature source Oral, resp. rate 18, height _0  (1.676 m), weight 61.236 kg (135 lb), SpO2 96 %.Body mass index is 21.8 kg/(m^2).  General Appearance: Fairly Groomed  Engineer, water::  Fair  Speech:  Normal Rate  Volume:  Normal  Mood:  denies depression, states mood is "OK"  Affect:  Appropriate  Thought Process:  essentially linear  Orientation:  Other:  fully alert and attentive, oriented to place, person, and time except for day of week  Thought Content:  describes auditory hallucinations , but does not appear internally preoccupied  Suicidal Thoughts:  No- denies any suicidal or homicidal ideations  Homicidal Thoughts:  No  Memory:  recent and remote grossly  intact  Judgement:  Fair  Insight:  Fair  Psychomotor Activity:  Normal- does have some distal tremors  Concentration:  Good  Recall:  Good  Fund of Knowledge:Good  Language: Good  Akathisia:  Negative  Handed:  Right  AIMS (if indicated):     Assets:  Desire for Improvement Resilience  Sleep:      Musculoskeletal: Strength & Muscle Tone: within normal limits- presents with some distal tremors Gait & Station: gait not examined Patient leans: N/A  Treatment Plan Summary: RECOMMENDATIONS 1. There are no grounds for involuntary commitment 2. 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 May benefit from being monitored in OBS to see if he will need psychiatric admission .  [name], [place] 06/29/2014 5:09 PM

## 2014-06-29 NOTE — Progress Notes (Signed)
[facility] INPATIENT:  Family/Significant Other Suicide Prevention Education  Suicide Prevention Education:  Patient Refusal for Family/Significant Other Suicide Prevention Education: The patient [name] has refused to provide written consent for family/significant other to be provided Family/Significant Other Suicide Prevention Education during admission and/or prior to discharge.    Celene KrasRobinson, Rudolf Blizard G 06/29/2014, 7:58 PM

## 2014-06-30 DIAGNOSIS — F29 Unspecified psychosis not due to a substance or known physiological condition: Secondary | ICD-10-CM

## 2014-06-30 DIAGNOSIS — F1020 Alcohol dependence, uncomplicated: Secondary | ICD-10-CM

## 2014-06-30 DIAGNOSIS — F1994 Other psychoactive substance use, unspecified with psychoactive substance-induced mood disorder: Secondary | ICD-10-CM

## 2014-06-30 MED ORDER — PREDNISONE 10 MG PO KIT: PACK | ORAL | Status: DC

## 2014-06-30 NOTE — H&P (Signed)
[place] OBS UNIT H&P  *Face to face encounter from Dr. Parke Poisson reviewed and I concur with its findings within this note. Patient details have been updated to reflect current patient status.   [name] is an 48 y.o. male. Total Time spent with patient: 45 minutes  Assessment: AXIS I:  Alcohol Dependence, Psychosis NOS, Substance-induced mood disorder AXIS II:  Deferred AXIS III:   Past Medical History  Diagnosis Date  . Hypertension   . Shortness of breath   . COPD (chronic obstructive pulmonary disease)   . Depression   . Alcohol abuse    AXIS IV:  homelessnes, unemployment, poor support network AXIS V:  Mild Symptoms  Plan:  Discharge home with outpatient resources for psychiatry, counseling, and substance abuse. To be assisted by TTS staff.    Subjective:   [name] is a 48 y.o. male patient admitted with complaints of  Cough  and chest pain upon inspiration.Pt reports that he would like detoxification and rehab (outpatient) for his alcoholism. Pt reports a history of chronic hallucinations as mentioned below, but these are not currently present during assessment. Pt denies SI, HI, and AVH, is calm, cooperative, and alert/oriented x4.   HPI:   Patient is a 48 year old man. He reports he is currently homeless. He presented to ED earlier today reporting coughing, weakness and chest wall pain, worsened by palpation and coughing. A Chest X Ray showed emphysema but no acute issues. Patient also reports significant ETOH consumption, and states he has been drinking 3-4 40 ounce beers " for a long time". Last drank yesterday. He also reports intermittent , rather vague auditory hallucinations, telling him" Darryon I am here". Denies command hallucinations. Last hallucinatory experience " a couple of days ago". Currently does not appear internally preoccupied. Patient denies depression and does not endorse significant neuro-vegetative symptoms of depression- no anhedonia, no sadness,  good sense of self esteem, no SI. Psychiatric History - see below ( underlined). Substance Abuse Hx-He reports a history of alcohol abuse, drinking daily recently. Reports a remote history of cocaine abuse, not recently. Denies DTs or seizures . Social Hx- homeless, unemployed, limited support network, separated, no bio children, denies legal issues. Family Hx- parents deceased, has siblings, (+) alcoholism in several members of family. No suicides in family.   HPI Elements:   Chronic Alcohol Abuse, progressing to pattern of dependence, intermittent auditory hallucinations.  Past Psychiatric History:- patient states he has a history of previous psychiatric admissions for alcohol detoxification. He states he has a history of one suicide attempt in 2002. He denies any PTSD or history of violence. He denies history of mania.  Past Medical History  Diagnosis Date  . Hypertension   . Shortness of breath   . COPD (chronic obstructive pulmonary disease)   . Depression   . Alcohol abuse     reports that he has been smoking Cigarettes.  He has been smoking about 2.00 packs per day. He has never used smokeless tobacco. He reports that he drinks about 6.0 oz of alcohol per week. He reports that he uses illicit drugs (Cocaine and Marijuana). Family History  Problem Relation Age of Onset  . Hypertension Mother   . Hypertension Father   . Hypertension Brother          Abuse/Neglect [facility]) Physical Abuse: Denies Verbal Abuse: Denies Sexual Abuse: Denies Allergies:  No Known Allergies   Objective: Blood pressure 105/70, pulse 83, temperature 98.8 F (37.1 C), temperature source  Oral, resp. rate 16, height _0  (1.676 m), weight 64.864 kg (143 lb), SpO2 100 %.Body mass index is 23.09 kg/(m^2). Results for orders placed or performed during the hospital encounter of 06/29/14 (from the past 72 hour(s))  CBC with Differential     Status: Abnormal   Collection Time: 06/29/14  7:03 AM  Result Value Ref  Range   WBC 4.3 4.0 - 10.5 K/uL   RBC 3.42 (L) 4.22 - 5.81 MIL/uL   Hemoglobin 12.3 (L) 13.0 - 17.0 g/dL   HCT 34.0 (L) 39.0 - 52.0 %   MCV 99.4 78.0 - 100.0 fL   MCH 36.0 (H) 26.0 - 34.0 pg   MCHC 36.2 (H) 30.0 - 36.0 g/dL   RDW 14.2 11.5 - 15.5 %   Platelets 155 150 - 400 K/uL   Neutrophils Relative % 68 43 - 77 %   Neutro Abs 2.9 1.7 - 7.7 K/uL   Lymphocytes Relative 15 12 - 46 %   Lymphs Abs 0.6 (L) 0.7 - 4.0 K/uL   Monocytes Relative 15 (H) 3 - 12 %   Monocytes Absolute 0.7 0.1 - 1.0 K/uL   Eosinophils Relative 1 0 - 5 %   Eosinophils Absolute 0.1 0.0 - 0.7 K/uL   Basophils Relative 1 0 - 1 %   Basophils Absolute 0.0 0.0 - 0.1 K/uL  Basic metabolic panel     Status: Abnormal   Collection Time: 06/29/14  7:03 AM  Result Value Ref Range   Sodium 141 137 - 147 mEq/L   Potassium 3.8 3.7 - 5.3 mEq/L   Chloride 101 96 - 112 mEq/L   CO2 22 19 - 32 mEq/L   Glucose, Bld 71 70 - 99 mg/dL   BUN 6 6 - 23 mg/dL   Creatinine, Ser 0.53 0.50 - 1.35 mg/dL   Calcium 9.3 8.4 - 10.5 mg/dL   GFR calc non Af Amer >90 >90 mL/min   GFR calc Af Amer >90 >90 mL/min    Comment: (NOTE) The eGFR has been calculated using the CKD EPI equation. This calculation has not been validated in all clinical situations. eGFR's persistently <90 mL/min signify possible Chronic Kidney Disease.    Anion gap 18 (H) 5 - 15  Ethanol     Status: Abnormal   Collection Time: 06/29/14  8:45 AM  Result Value Ref Range   Alcohol, Ethyl (B) 31 (H) 0 - 11 mg/dL    Comment:        LOWEST DETECTABLE LIMIT FOR SERUM ALCOHOL IS 11 mg/dL FOR MEDICAL PURPOSES ONLY    Labs are reviewed and are pertinent for BAL 31.    Current Facility-Administered Medications  Medication Dose Route Frequency Provider Last Rate Last Dose  . albuterol (PROVENTIL HFA;VENTOLIN HFA) 108 (90 BASE) MCG/ACT inhaler 2 puff  2 puff Inhalation Q6H PRN Waylan Boga, NP   2 puff at 06/30/14 0012  . folic acid (FOLVITE) tablet 1 mg  1 mg Oral Daily  Waylan Boga, NP   1 mg at 06/30/14 0904  . ipratropium-albuterol (DUONEB) 0.5-2.5 (3) MG/3ML nebulizer solution 3 mL  3 mL Nebulization Q6H PRN Waylan Boga, NP      . LORazepam (ATIVAN) tablet 0-4 mg  0-4 mg Oral Q6H Waylan Boga, NP   2 mg at 06/30/14 0550   Followed by  . [START ON 07/01/2014] LORazepam (ATIVAN) tablet 0-4 mg  0-4 mg Oral Q12H Waylan Boga, NP   2 mg at 06/30/14 0011  . multivitamin  with minerals tablet 1 tablet  1 tablet Oral Daily Waylan Boga, NP   1 tablet at 06/30/14 0904  . predniSONE (DELTASONE) tablet 10 mg  10 mg Oral 5 X Daily Waylan Boga, NP   10 mg at 06/30/14 0903  . [START ON 07/01/2014] predniSONE (DELTASONE) tablet 10 mg  10 mg Oral QID Waylan Boga, NP      . Derrill Memo ON 07/02/2014] predniSONE (DELTASONE) tablet 10 mg  10 mg Oral TID Waylan Boga, NP      . Derrill Memo ON 07/03/2014] predniSONE (DELTASONE) tablet 10 mg  10 mg Oral BID WC Waylan Boga, NP      . Derrill Memo ON 07/04/2014] predniSONE (DELTASONE) tablet 10 mg  10 mg Oral Once Waylan Boga, NP      . thiamine (VITAMIN B-1) tablet 100 mg  100 mg Oral Daily Waylan Boga, NP   100 mg at 06/30/14 9753   Or  . thiamine (B-1) injection 100 mg  100 mg Intravenous Daily Waylan Boga, NP        Psychiatric Specialty Exam:  ROS- poor sleep, poor appetite,  No fever,chest pain upon coughing, SOB upon exertion, no melenas endorsed, describes lower extremity pain from "arthritis"    Blood pressure 105/70, pulse 83, temperature 98.8 F (37.1 C), temperature source Oral, resp. rate 16, height _0  (1.676 m), weight 64.864 kg (143 lb), SpO2 100 %.Body mass index is 23.09 kg/(m^2).  General Appearance: Fairly Groomed  Engineer, water::  Fair  Speech:  Normal Rate  Volume:  Normal  Mood:  Euthymic  Affect:  Appropriate  Thought Process:  essentially linear  Orientation:  Full (Time, Place, and Person)  Thought Content:  WDL  Suicidal Thoughts:  No- denies any suicidal or homicidal ideations  Homicidal Thoughts:  No   Memory:  recent and remote grossly intact  Judgement:  Fair  Insight:  Fair  Psychomotor Activity:  Normal- does have some distal tremors (pt reports that this is chronic and baseline now)   Concentration:  Good  Recall:  Good  Fund of Knowledge:Good  Language: Good  Akathisia:  Negative  Handed:  Right  AIMS (if indicated):     Assets:  Desire for Improvement Resilience  Sleep:      Musculoskeletal: Strength & Muscle Tone: within normal limits- presents with some distal tremors Gait & Station: gait not examined Patient leans: N/A  Treatment Plan Summary: Discharge home with outpatient resources for psychiatry, counseling, and substance abuse. To be assisted by TTS staff.    Benjamine Mola, FNP-BC  06/30/2014 9:21 AM

## 2014-06-30 NOTE — Progress Notes (Signed)
Discharge note: Pt received both written and verbal discharge instructions. Pt verbalized understanding of discharge instructions. Pt denies SI/HI/AVH. Per pt, he is feeling a lot better and ready to discharge. Pt received belongings from locker, prescription and a bus pass.

## 2014-06-30 NOTE — Consult Note (Deleted)
[facility] OBS UNIT H&P  *Face to face encounter from Dr. Cobos reviewed and I concur with its findings within this note. Patient details have been updated to reflect current patient status.   [name] is an 48 y.o. male. Total Time spent with patient: 45 minutes  Assessment: AXIS I:  Alcohol Dependence, Psychosis NOS, Substance-induced mood disorder AXIS II:  Deferred AXIS III:   Past Medical History  Diagnosis Date  . Hypertension   . Shortness of breath   . COPD (chronic obstructive pulmonary disease)   . Depression   . Alcohol abuse    AXIS IV:  homelessnes, unemployment, poor support network AXIS V:  Mild Symptoms  Plan:  Discharge home with outpatient resources for psychiatry, counseling, and substance abuse. To be assisted by TTS staff.    Subjective:   [name] is a 48 y.o. male patient admitted with complaints of  Cough  and chest pain upon inspiration.Pt reports that he would like detoxification and rehab (outpatient) for his alcoholism. Pt reports a history of chronic hallucinations as mentioned below, but these are not currently present during assessment. Pt denies SI, HI, and AVH, is calm, cooperative, and alert/oriented x4.   HPI:   Patient is a 48 year old man. He reports he is currently homeless. He presented to ED earlier today reporting coughing, weakness and chest wall pain, worsened by palpation and coughing. A Chest X Ray showed emphysema but no acute issues. Patient also reports significant ETOH consumption, and states he has been drinking 3-4 40 ounce beers " for a long time". Last drank yesterday. He also reports intermittent , rather vague auditory hallucinations, telling him" Jerren I am here". Denies command hallucinations. Last hallucinatory experience " a couple of days ago". Currently does not appear internally preoccupied. Patient denies depression and does not endorse significant neuro-vegetative symptoms of depression- no anhedonia, no sadness,  good sense of self esteem, no SI. Psychiatric History - see below ( underlined). Substance Abuse Hx-He reports a history of alcohol abuse, drinking daily recently. Reports a remote history of cocaine abuse, not recently. Denies DTs or seizures . Social Hx- homeless, unemployed, limited support network, separated, no bio children, denies legal issues. Family Hx- parents deceased, has siblings, (+) alcoholism in several members of family. No suicides in family.   HPI Elements:   Chronic Alcohol Abuse, progressing to pattern of dependence, intermittent auditory hallucinations.  Past Psychiatric History:- patient states he has a history of previous psychiatric admissions for alcohol detoxification. He states he has a history of one suicide attempt in 2002. He denies any PTSD or history of violence. He denies history of mania.  Past Medical History  Diagnosis Date  . Hypertension   . Shortness of breath   . COPD (chronic obstructive pulmonary disease)   . Depression   . Alcohol abuse     reports that he has been smoking Cigarettes.  He has been smoking about 2.00 packs per day. He has never used smokeless tobacco. He reports that he drinks about 6.0 oz of alcohol per week. He reports that he uses illicit drugs (Cocaine and Marijuana). Family History  Problem Relation Age of Onset  . Hypertension Mother   . Hypertension Father   . Hypertension Brother          Abuse/Neglect ([facility]) Physical Abuse: Denies Verbal Abuse: Denies Sexual Abuse: Denies Allergies:  No Known Allergies   Objective: Blood pressure 105/70, pulse 83, temperature 98.8 F (37.1 C), temperature source   Oral, resp. rate 16, height _0  (1.676 m), weight 64.864 kg (143 lb), SpO2 100 %.Body mass index is 23.09 kg/(m^2). Results for orders placed or performed during the hospital encounter of 06/29/14 (from the past 72 hour(s))  CBC with Differential     Status: Abnormal   Collection Time: 06/29/14  7:03 AM  Result Value Ref  Range   WBC 4.3 4.0 - 10.5 K/uL   RBC 3.42 (L) 4.22 - 5.81 MIL/uL   Hemoglobin 12.3 (L) 13.0 - 17.0 g/dL   HCT 34.0 (L) 39.0 - 52.0 %   MCV 99.4 78.0 - 100.0 fL   MCH 36.0 (H) 26.0 - 34.0 pg   MCHC 36.2 (H) 30.0 - 36.0 g/dL   RDW 14.2 11.5 - 15.5 %   Platelets 155 150 - 400 K/uL   Neutrophils Relative % 68 43 - 77 %   Neutro Abs 2.9 1.7 - 7.7 K/uL   Lymphocytes Relative 15 12 - 46 %   Lymphs Abs 0.6 (L) 0.7 - 4.0 K/uL   Monocytes Relative 15 (H) 3 - 12 %   Monocytes Absolute 0.7 0.1 - 1.0 K/uL   Eosinophils Relative 1 0 - 5 %   Eosinophils Absolute 0.1 0.0 - 0.7 K/uL   Basophils Relative 1 0 - 1 %   Basophils Absolute 0.0 0.0 - 0.1 K/uL  Basic metabolic panel     Status: Abnormal   Collection Time: 06/29/14  7:03 AM  Result Value Ref Range   Sodium 141 137 - 147 mEq/L   Potassium 3.8 3.7 - 5.3 mEq/L   Chloride 101 96 - 112 mEq/L   CO2 22 19 - 32 mEq/L   Glucose, Bld 71 70 - 99 mg/dL   BUN 6 6 - 23 mg/dL   Creatinine, Ser 0.53 0.50 - 1.35 mg/dL   Calcium 9.3 8.4 - 10.5 mg/dL   GFR calc non Af Amer >90 >90 mL/min   GFR calc Af Amer >90 >90 mL/min    Comment: (NOTE) The eGFR has been calculated using the CKD EPI equation. This calculation has not been validated in all clinical situations. eGFR's persistently <90 mL/min signify possible Chronic Kidney Disease.    Anion gap 18 (H) 5 - 15  Ethanol     Status: Abnormal   Collection Time: 06/29/14  8:45 AM  Result Value Ref Range   Alcohol, Ethyl (B) 31 (H) 0 - 11 mg/dL    Comment:        LOWEST DETECTABLE LIMIT FOR SERUM ALCOHOL IS 11 mg/dL FOR MEDICAL PURPOSES ONLY    Labs are reviewed and are pertinent for BAL 31.    Current Facility-Administered Medications  Medication Dose Route Frequency Provider Last Rate Last Dose  . albuterol (PROVENTIL HFA;VENTOLIN HFA) 108 (90 BASE) MCG/ACT inhaler 2 puff  2 puff Inhalation Q6H PRN Waylan Boga, NP   2 puff at 06/30/14 0012  . folic acid (FOLVITE) tablet 1 mg  1 mg Oral Daily  Waylan Boga, NP   1 mg at 06/30/14 0904  . ipratropium-albuterol (DUONEB) 0.5-2.5 (3) MG/3ML nebulizer solution 3 mL  3 mL Nebulization Q6H PRN Waylan Boga, NP      . LORazepam (ATIVAN) tablet 0-4 mg  0-4 mg Oral Q6H Waylan Boga, NP   2 mg at 06/30/14 0550   Followed by  . [START ON 07/01/2014] LORazepam (ATIVAN) tablet 0-4 mg  0-4 mg Oral Q12H Waylan Boga, NP   2 mg at 06/30/14 0011  . multivitamin  with minerals tablet 1 tablet  1 tablet Oral Daily Jamison Lord, NP   1 tablet at 06/30/14 0904  . predniSONE (DELTASONE) tablet 10 mg  10 mg Oral 5 X Daily Jamison Lord, NP   10 mg at 06/30/14 0903  . [START ON 07/01/2014] predniSONE (DELTASONE) tablet 10 mg  10 mg Oral QID Jamison Lord, NP      . [START ON 07/02/2014] predniSONE (DELTASONE) tablet 10 mg  10 mg Oral TID Jamison Lord, NP      . [START ON 07/03/2014] predniSONE (DELTASONE) tablet 10 mg  10 mg Oral BID WC Jamison Lord, NP      . [START ON 07/04/2014] predniSONE (DELTASONE) tablet 10 mg  10 mg Oral Once Jamison Lord, NP      . thiamine (VITAMIN B-1) tablet 100 mg  100 mg Oral Daily Jamison Lord, NP   100 mg at 06/30/14 0904   Or  . thiamine (B-1) injection 100 mg  100 mg Intravenous Daily Jamison Lord, NP        Psychiatric Specialty Exam:  ROS- poor sleep, poor appetite,  No fever,chest pain upon coughing, SOB upon exertion, no melenas endorsed, describes lower extremity pain from "arthritis"    Blood pressure 105/70, pulse 83, temperature 98.8 F (37.1 C), temperature source Oral, resp. rate 16, height 5' 6" (1.676 m), weight 64.864 kg (143 lb), SpO2 100 %.Body mass index is 23.09 kg/(m^2).  General Appearance: Fairly Groomed  Eye Contact::  Fair  Speech:  Normal Rate  Volume:  Normal  Mood:  Euthymic  Affect:  Appropriate  Thought Process:  essentially linear  Orientation:  Full (Time, Place, and Person)  Thought Content:  WDL  Suicidal Thoughts:  No- denies any suicidal or homicidal ideations  Homicidal Thoughts:  No   Memory:  recent and remote grossly intact  Judgement:  Fair  Insight:  Fair  Psychomotor Activity:  Normal- does have some distal tremors (pt reports that this is chronic and baseline now)   Concentration:  Good  Recall:  Good  Fund of Knowledge:Good  Language: Good  Akathisia:  Negative  Handed:  Right  AIMS (if indicated):     Assets:  Desire for Improvement Resilience  Sleep:      Musculoskeletal: Strength & Muscle Tone: within normal limits- presents with some distal tremors Gait & Station: gait not examined Patient leans: N/A  Treatment Plan Summary: Discharge home with outpatient resources for psychiatry, counseling, and substance abuse. To be assisted by TTS staff.    Christobal Morado C, FNP-BC  06/30/2014 9:21 AM  

## 2014-06-30 NOTE — Discharge Instructions (Signed)
To help you maintain a sober lifestyle, a substance abuse treatment program may be helpful to you.  Contact one of the following agencies at your earliest opportunity to start their program:       [facility]      [address]      [place], [place] [address]      [phone]       [facility]      [address]      [place], [place] [address]      [phone]

## 2014-06-30 NOTE — Discharge Summary (Signed)
[name] OBS UNIT DISCHARGE SUMMARY  [name] is an 48 y.o. male. Total Time spent with patient: 45 minutes  Assessment: AXIS I:  Alcohol Dependence, Psychosis NOS, Substance-induced mood disorder AXIS II:  Deferred AXIS III:   Past Medical History  Diagnosis Date  . Hypertension   . Shortness of breath   . COPD (chronic obstructive pulmonary disease)   . Depression   . Alcohol abuse    AXIS IV:  homelessnes, unemployment, poor support network AXIS V:  Mild Symptoms  Plan:  Discharge home with outpatient resources for psychiatry, counseling, and substance abuse. To be assisted by TTS staff.    Subjective:   [name] is a 48 y.o. male patient admitted with complaints of  Cough  and chest pain upon inspiration.Pt reports that he would like detoxification and rehab (outpatient) for his alcoholism. Pt reports a history of chronic hallucinations as mentioned below, but these are not currently present during assessment. Pt denies SI, HI, and AVH, is calm, cooperative, and alert/oriented x4.   HPI:   Patient is a 48 year old man. He reports he is currently homeless. He presented to ED earlier today reporting coughing, weakness and chest wall pain, worsened by palpation and coughing. A Chest X Ray showed emphysema but no acute issues. Patient also reports significant ETOH consumption, and states he has been drinking 3-4 40 ounce beers " for a long time". Last drank yesterday. He also reports intermittent , rather vague auditory hallucinations, telling him" Keir I am here". Denies command hallucinations. Last hallucinatory experience " a couple of days ago". Currently does not appear internally preoccupied. Patient denies depression and does not endorse significant neuro-vegetative symptoms of depression- no anhedonia, no sadness, good sense of self esteem, no SI. Psychiatric History - see below ( underlined). Substance Abuse Hx-He reports a history of alcohol abuse, drinking daily  recently. Reports a remote history of cocaine abuse, not recently. Denies DTs or seizures . Social Hx- homeless, unemployed, limited support network, separated, no bio children, denies legal issues. Family Hx- parents deceased, has siblings, (+) alcoholism in several members of family. No suicides in family.    HPI Elements:   Chronic Alcohol Abuse, progressing to pattern of dependence, intermittent auditory hallucinations.  Past Psychiatric History:- patient states he has a history of previous psychiatric admissions for alcohol detoxification. He states he has a history of one suicide attempt in 2002. He denies any PTSD or history of violence. He denies history of mania.  Past Medical History  Diagnosis Date  . Hypertension   . Shortness of breath   . COPD (chronic obstructive pulmonary disease)   . Depression   . Alcohol abuse     reports that he has been smoking Cigarettes.  He has been smoking about 2.00 packs per day. He has never used smokeless tobacco. He reports that he drinks about 6.0 oz of alcohol per week. He reports that he uses illicit drugs (Cocaine and Marijuana). Family History  Problem Relation Age of Onset  . Hypertension Mother   . Hypertension Father   . Hypertension Brother       Abuse/Neglect [facility]) Physical Abuse: Denies Verbal Abuse: Denies Sexual Abuse: Denies Allergies:  No Known Allergies  Objective: Blood pressure 105/70, pulse 83, temperature 98.8 F (37.1 C), temperature source Oral, resp. rate 16, height '5\' 6"'  (1.676 m), weight 64.864 kg (143 lb), SpO2 100 %.Body mass index is 23.09 kg/(m^2).  Results for orders placed or performed during the  hospital encounter of 06/29/14 (from the past 72 hour(s))  CBC with Differential     Status: Abnormal   Collection Time: 06/29/14  7:03 AM  Result Value Ref Range   WBC 4.3 4.0 - 10.5 K/uL   RBC 3.42 (L) 4.22 - 5.81 MIL/uL   Hemoglobin 12.3 (L) 13.0 - 17.0 g/dL   HCT 34.0 (L) 39.0 - 52.0 %   MCV 99.4 78.0 -  100.0 fL   MCH 36.0 (H) 26.0 - 34.0 pg   MCHC 36.2 (H) 30.0 - 36.0 g/dL   RDW 14.2 11.5 - 15.5 %   Platelets 155 150 - 400 K/uL   Neutrophils Relative % 68 43 - 77 %   Neutro Abs 2.9 1.7 - 7.7 K/uL   Lymphocytes Relative 15 12 - 46 %   Lymphs Abs 0.6 (L) 0.7 - 4.0 K/uL   Monocytes Relative 15 (H) 3 - 12 %   Monocytes Absolute 0.7 0.1 - 1.0 K/uL   Eosinophils Relative 1 0 - 5 %   Eosinophils Absolute 0.1 0.0 - 0.7 K/uL   Basophils Relative 1 0 - 1 %   Basophils Absolute 0.0 0.0 - 0.1 K/uL  Basic metabolic panel     Status: Abnormal   Collection Time: 06/29/14  7:03 AM  Result Value Ref Range   Sodium 141 137 - 147 mEq/L   Potassium 3.8 3.7 - 5.3 mEq/L   Chloride 101 96 - 112 mEq/L   CO2 22 19 - 32 mEq/L   Glucose, Bld 71 70 - 99 mg/dL   BUN 6 6 - 23 mg/dL   Creatinine, Ser 0.53 0.50 - 1.35 mg/dL   Calcium 9.3 8.4 - 10.5 mg/dL   GFR calc non Af Amer >90 >90 mL/min   GFR calc Af Amer >90 >90 mL/min    Comment: (NOTE) The eGFR has been calculated using the CKD EPI equation. This calculation has not been validated in all clinical situations. eGFR's persistently <90 mL/min signify possible Chronic Kidney Disease.    Anion gap 18 (H) 5 - 15  Ethanol     Status: Abnormal   Collection Time: 06/29/14  8:45 AM  Result Value Ref Range   Alcohol, Ethyl (B) 31 (H) 0 - 11 mg/dL    Comment:        LOWEST DETECTABLE LIMIT FOR SERUM ALCOHOL IS 11 mg/dL FOR MEDICAL PURPOSES ONLY    Labs are reviewed and are pertinent for BAL 31.    Current Facility-Administered Medications  Medication Dose Route Frequency Provider Last Rate Last Dose  . albuterol (PROVENTIL HFA;VENTOLIN HFA) 108 (90 BASE) MCG/ACT inhaler 2 puff  2 puff Inhalation Q6H PRN Waylan Boga, NP   2 puff at 06/30/14 0012  . folic acid (FOLVITE) tablet 1 mg  1 mg Oral Daily Waylan Boga, NP   1 mg at 06/30/14 0904  . ipratropium-albuterol (DUONEB) 0.5-2.5 (3) MG/3ML nebulizer solution 3 mL  3 mL Nebulization Q6H PRN Waylan Boga, NP      . LORazepam (ATIVAN) tablet 0-4 mg  0-4 mg Oral Q6H Waylan Boga, NP   2 mg at 06/30/14 0550   Followed by  . [START ON 07/01/2014] LORazepam (ATIVAN) tablet 0-4 mg  0-4 mg Oral Q12H Waylan Boga, NP   2 mg at 06/30/14 0011  . multivitamin with minerals tablet 1 tablet  1 tablet Oral Daily Waylan Boga, NP   1 tablet at 06/30/14 0904  . predniSONE (DELTASONE) tablet 10 mg  10 mg  Oral 5 X Daily Waylan Boga, NP   10 mg at 06/30/14 0903  . [START ON 07/01/2014] predniSONE (DELTASONE) tablet 10 mg  10 mg Oral QID Waylan Boga, NP      . Derrill Memo ON 07/02/2014] predniSONE (DELTASONE) tablet 10 mg  10 mg Oral TID Waylan Boga, NP      . Derrill Memo ON 07/03/2014] predniSONE (DELTASONE) tablet 10 mg  10 mg Oral BID WC Waylan Boga, NP      . Derrill Memo ON 07/04/2014] predniSONE (DELTASONE) tablet 10 mg  10 mg Oral Once Waylan Boga, NP      . thiamine (VITAMIN B-1) tablet 100 mg  100 mg Oral Daily Waylan Boga, NP   100 mg at 06/30/14 5825   Or  . thiamine (B-1) injection 100 mg  100 mg Intravenous Daily Waylan Boga, NP        Psychiatric Specialty Exam:  ROS- poor sleep, poor appetite,  No fever,chest pain upon coughing, SOB upon exertion, no melenas endorsed, describes lower extremity pain from "arthritis"    BP 105/70 mmHg  Pulse 83  Temp(Src) 98.8 F (37.1 C) (Oral)  Resp 16  Ht '5\' 6"'  (1.676 m)  Wt 64.864 kg (143 lb)  BMI 23.09 kg/m2  SpO2 100%   General Appearance: Fairly Groomed  Engineer, water::  Fair  Speech:  Normal Rate  Volume:  Normal  Mood:  Euthymic  Affect:  Appropriate  Thought Process:  essentially linear  Orientation:  Full (Time, Place, and Person)  Thought Content:  WDL  Suicidal Thoughts:  No- denies any suicidal or homicidal ideations  Homicidal Thoughts:  No  Memory:  recent and remote grossly intact  Judgement:  Fair  Insight:  Fair  Psychomotor Activity:  Normal- does have some distal tremors (pt reports that this is chronic and baseline now)    Concentration:  Good  Recall:  Good  Fund of Knowledge:Good  Language: Good  Akathisia:  Negative  Handed:  Right  AIMS (if indicated):     Assets:  Desire for Improvement Resilience  Sleep:      Musculoskeletal: Strength & Muscle Tone: within normal limits- presents with some distal tremors Gait & Station: gait not examined Patient leans: N/A  Treatment Plan Summary:  Discharge home with outpatient resources for psychiatry, counseling, and substance abuse. To be assisted by TTS staff.    Benjamine Mola, FNP-BC  06/30/2014 11:43 AM

## 2014-06-30 NOTE — Progress Notes (Signed)
D: Pt observed to be asleep this morning with no distress. Writer awaken pt at 900 am to administer morning meds. Pt noted to be tremulous and diaphoretic on approach. Pt presents with mild confusion on approach, pt A&O to self and situation. Pt confused to time and placement. Pt denies any SI/HI/AVH at this time.  A: Medications administered as ordered per MD. Verbal support given. Pt encouraged to eat breakfast. 15 minute checks performed for safety.  R: Pt safety maintained by staff.

## 2014-06-30 NOTE — Plan of Care (Signed)
[facility] Observation Crisis Plan  Reason for Crisis Plan:  Substance Abuse   Plan of Care:  Referral for Substance Abuse  Family Support:    Two brothers, one sister [name]([name], [name], [name])  Current Living Environment:   Alone (can return to household)  Insurance:  Self Pay Hospital Account    Name Acct ID Class Status Primary Coverage   [name], [name] [id number] BEHAVIORAL HEALTH OBSERVATION Open None        Guarantor Account (for Hospital Account [id number])    Name Relation to Pt Service Area Active? Acct Type   [name], [name] Self [facility] Yes Behavioral Health   Address Phone       [address] [place], [place] [id number] [phone](H)          Coverage Information (for Hospital Account [id number])    Not on file      Legal Guardian:   Self  Primary Care Provider:  No PCP Per Patient  Current Outpatient Providers:  None  Psychiatrist:   None  Counselor/Therapist:   None  Compliant with Medications:  No  Additional Information: After consulting with Danny Headonrad Withrow, NP it has been determined that pt does not present Merritt life threatening danger to himself or others, and that psychiatric hospitalization is not indicated for him at this time.  He would, however, benefit from substance abuse rehabilitation treatment, and he would prefer for this to be on an outpatient basis.  [facility]  Observation Unit nursing staff report that pt has requested Merritt bus pass, which has been provided.  Danny Canninghomas Farris Geiman, MA Triage Specialist [name], [name] 11/10/201511:35 AM

## 2014-08-25 ENCOUNTER — Encounter (HOSPITAL_COMMUNITY): Payer: Self-pay | Admitting: Emergency Medicine

## 2014-08-25 ENCOUNTER — Emergency Department (HOSPITAL_COMMUNITY)
Admission: EM | Admit: 2014-08-25 | Discharge: 2014-08-26 | Disposition: A | Discharge status: Home / Self Care | Payer: Self-pay | Attending: Emergency Medicine | Admitting: Emergency Medicine

## 2014-08-25 DIAGNOSIS — I10 Essential (primary) hypertension: Secondary | ICD-10-CM | POA: Insufficient documentation

## 2014-08-25 DIAGNOSIS — Z720 Tobacco use: Secondary | ICD-10-CM | POA: Insufficient documentation

## 2014-08-25 DIAGNOSIS — J449 Chronic obstructive pulmonary disease, unspecified: Secondary | ICD-10-CM | POA: Insufficient documentation

## 2014-08-25 DIAGNOSIS — F10929 Alcohol use, unspecified with intoxication, unspecified: Secondary | ICD-10-CM

## 2014-08-25 DIAGNOSIS — F10129 Alcohol abuse with intoxication, unspecified: Secondary | ICD-10-CM | POA: Insufficient documentation

## 2014-08-25 DIAGNOSIS — F32A Depression, unspecified: Secondary | ICD-10-CM

## 2014-08-25 DIAGNOSIS — Z79899 Other long term (current) drug therapy: Secondary | ICD-10-CM | POA: Insufficient documentation

## 2014-08-25 DIAGNOSIS — F329 Major depressive disorder, single episode, unspecified: Secondary | ICD-10-CM

## 2014-08-25 DIAGNOSIS — F1010 Alcohol abuse, uncomplicated: Secondary | ICD-10-CM

## 2014-08-25 DIAGNOSIS — Z7982 Long term (current) use of aspirin: Secondary | ICD-10-CM | POA: Insufficient documentation

## 2014-08-25 LAB — RAPID URINE DRUG SCREEN, HOSP PERFORMED
Amphetamines: NOT DETECTED
Barbiturates: NOT DETECTED
Benzodiazepines: NOT DETECTED
Cocaine: NOT DETECTED
OPIATES: NOT DETECTED
Tetrahydrocannabinol: NOT DETECTED

## 2014-08-25 LAB — COMPREHENSIVE METABOLIC PANEL
ALT: 36 U/L (ref 0–53)
ANION GAP: 11 (ref 5–15)
AST: 79 U/L — ABNORMAL HIGH (ref 0–37)
Albumin: 4.4 g/dL (ref 3.5–5.2)
Alkaline Phosphatase: 61 U/L (ref 39–117)
BILIRUBIN TOTAL: 0.3 mg/dL (ref 0.3–1.2)
CO2: 26 mmol/L (ref 19–32)
Calcium: 9.2 mg/dL (ref 8.4–10.5)
Chloride: 100 mEq/L (ref 96–112)
Creatinine, Ser: 0.71 mg/dL (ref 0.50–1.35)
GLUCOSE: 89 mg/dL (ref 70–99)
Potassium: 3.5 mmol/L (ref 3.5–5.1)
Sodium: 137 mmol/L (ref 135–145)
TOTAL PROTEIN: 7.3 g/dL (ref 6.0–8.3)

## 2014-08-25 LAB — CBC WITH DIFFERENTIAL/PLATELET
Basophils Absolute: 0.1 10*3/uL (ref 0.0–0.1)
Basophils Relative: 2 % — ABNORMAL HIGH (ref 0–1)
EOS PCT: 5 % (ref 0–5)
Eosinophils Absolute: 0.2 10*3/uL (ref 0.0–0.7)
HEMATOCRIT: 38.2 % — AB (ref 39.0–52.0)
HEMOGLOBIN: 13.6 g/dL (ref 13.0–17.0)
LYMPHS PCT: 39 % (ref 12–46)
Lymphs Abs: 1.6 10*3/uL (ref 0.7–4.0)
MCH: 36 pg — ABNORMAL HIGH (ref 26.0–34.0)
MCHC: 35.6 g/dL (ref 30.0–36.0)
MCV: 101.1 fL — ABNORMAL HIGH (ref 78.0–100.0)
MONO ABS: 0.5 10*3/uL (ref 0.1–1.0)
Monocytes Relative: 12 % (ref 3–12)
Neutro Abs: 1.7 10*3/uL (ref 1.7–7.7)
Neutrophils Relative %: 42 % — ABNORMAL LOW (ref 43–77)
Platelets: 223 10*3/uL (ref 150–400)
RBC: 3.78 MIL/uL — AB (ref 4.22–5.81)
RDW: 14.8 % (ref 11.5–15.5)
WBC: 4 10*3/uL (ref 4.0–10.5)

## 2014-08-25 LAB — I-STAT TROPONIN, ED: Troponin i, poc: 0 ng/mL (ref 0.00–0.08)

## 2014-08-25 LAB — ETHANOL: ALCOHOL ETHYL (B): 373 mg/dL — AB (ref 0–9)

## 2014-08-25 MED ORDER — SODIUM CHLORIDE 0.9 % IV BOLUS (SEPSIS)
1000.0000 mL | Freq: Once | INTRAVENOUS | Status: AC
  Administered 2014-08-25: 1000 mL via INTRAVENOUS

## 2014-08-25 NOTE — ED Notes (Signed)
"  I've had 5 40's and i've had one can of beer."

## 2014-08-25 NOTE — ED Notes (Signed)
Pt. requesting detox for his chronic alcoholism , last drink today , denies suicidal ideation , no hallucinations , denies nausea or vomitting / respirations unlabored .

## 2014-08-25 NOTE — ED Notes (Signed)
Pt. wearing paper scrubs and wanded by security at triage .  

## 2014-08-25 NOTE — ED Provider Notes (Signed)
CSN: [id number]     Arrival date & time 08/25/14  1922 History   First MD Initiated Contact with Patient 08/25/14 2055     Chief Complaint  Patient presents with  . Alcohol Problem     (Consider location/radiation/quality/duration/timing/severity/associated sxs/prior Treatment) The history is provided by the patient. No language interpreter was used.  [name] is a 49 y/o M with PMHx of HTN, COPD on no oxygen therapy, Depression, alcohol abuse presenting to the ED with detox from alcohol request. Patient reported that he has been to detox before for alcohol at [facility] approximately 3-4 years ago, but relapsed. Patient reported that he drinks at least 5-6 liters of beer per day - stated that today he had 5 forty ounces and one 24 ounce of beer. Stated that his last drink was at 1:00PM this afternoon. Patient reported that he does have history of COPD, but stated that he does not use oxygen he uses albuterol as needed - stated that he does continue to smoke cigarettes - reported at least 2 ppd. Denied marijuana, cocaine, and heroin use. Patient reported that he is currently homeless. Stated that he does have bouts of depression. Denied fever, chills, chest pain, shortness of breath, difficulty breathing, abdominal pain, nausea, vomiting, diarrhea, shakes, tremors, blurred vision, sudden loss of vision, neck pain, neck stiffness, back pain, weakness, homicidal ideation, suicidal ideation, auditory and visual hallucination. PCP none  Past Medical History  Diagnosis Date  . Hypertension   . Shortness of breath   . COPD (chronic obstructive pulmonary disease)   . Depression   . Alcohol abuse    Past Surgical History  Procedure Laterality Date  . Hemorrhoid surgery    . Mandible fracture surgery    . Back surgery    . Hernia repair     Family History  Problem Relation Age of Onset  . Hypertension Mother   . Hypertension Father   . Hypertension Brother    History  Substance Use Topics   . Smoking status: Current Every Day Smoker -- 2.00 packs/day    Types: Cigarettes  . Smokeless tobacco: Never Used  . Alcohol Use: 6.0 oz/week    10 Cans of beer per week     Comment: 6- 40's daily     Review of Systems  Constitutional: Negative for fever and chills.  Eyes: Negative for visual disturbance.  Respiratory: Negative for chest tightness and shortness of breath.   Cardiovascular: Negative for chest pain.  Gastrointestinal: Negative for nausea, vomiting and abdominal pain.  Musculoskeletal: Negative for back pain, neck pain and neck stiffness.  Psychiatric/Behavioral: Positive for dysphoric mood. Negative for suicidal ideas, hallucinations, confusion and agitation. The patient is not nervous/anxious.       Allergies  Review of patient's allergies indicates no known allergies.  Home Medications   Prior to Admission medications   Medication Sig Start Date End Date Taking? Authorizing Provider  albuterol (PROVENTIL HFA;VENTOLIN HFA) 108 (90 BASE) MCG/ACT inhaler Inhale 2 puffs into the lungs every 6 (six) hours as needed for wheezing or shortness of breath (wheezing).  06/19/13  Yes Gregor Hams, MD  Aspirin-Salicylamide-Caffeine [facility] HEADACHE POWDER PO) Take 1 packet by mouth 2 (two) times daily as needed (pain).    Yes Historical Provider, MD  oxyCODONE-acetaminophen (PERCOCET/ROXICET) 5-325 MG per tablet Take 1-2 tablets by mouth every 6 (six) hours as needed for severe pain. Patient not taking: Reported on 08/25/2014 06/08/14   Threasa Beards, MD  PredniSONE 10  MG KIT 6-day Patient not taking: Reported on 08/25/2014 06/30/14   Elyse Jarvis Withrow, FNP   BP 106/67 mmHg  Pulse 89  Temp(Src) 97.2 F (36.2 C) (Oral)  Resp 16  SpO2 91% Physical Exam  Constitutional: He is oriented to person, place, and time. He appears well-developed and well-nourished. No distress.  Smell of alcohol on breath   HENT:  Head: Normocephalic and atraumatic.  Mouth/Throat: Oropharynx is clear  and moist. No oropharyngeal exudate.  Eyes: Conjunctivae and EOM are normal. Pupils are equal, round, and reactive to light. Right eye exhibits no discharge. Left eye exhibits no discharge.  Rightward gaze to the right eye - chronic as per patient   Neck: Normal range of motion. Neck supple. No tracheal deviation present.  Cardiovascular: Normal rate, regular rhythm and normal heart sounds.  Exam reveals no friction rub.   No murmur heard. Pulmonary/Chest: Effort normal and breath sounds normal. No respiratory distress. He has no wheezes. He has no rales.  Abdominal: Soft. Bowel sounds are normal. He exhibits no distension. There is no tenderness. There is no rebound and no guarding.  Negative abdominal distension  BS normoactive in all 4 quadrants Abdomen soft upon palpation  Hernia noted just superior to the umbilicus - reducible Negative peritoneal signs  Negative guarding or rigidity noted  Musculoskeletal: Normal range of motion.  Full ROM to upper and lower extremities without difficulty noted, negative ataxia noted.  Lymphadenopathy:    He has no cervical adenopathy.  Neurological: He is alert and oriented to person, place, and time. No cranial nerve deficit. He exhibits normal muscle tone.  Cranial nerves III-XII grossly intact Strength 5+/5+ to upper and lower extremities bilaterally with resistance applied, equal distribution noted Equal grip strength bilaterally  Negative facial droop Negative slurred speech  Negative aphasia Patient is able to bring finger to nose bilaterally  Patient follows commands well  Patient responds to questions  Negative arm drift  Skin: Skin is warm and dry. No rash noted. He is not diaphoretic. No erythema.  Psychiatric: He has a normal mood and affect. His behavior is normal. Thought content normal.  Nursing note and vitals reviewed.   ED Course  Procedures (including critical care time)  Results for orders placed or performed during the  hospital encounter of 08/25/14  Ethanol  Result Value Ref Range   Alcohol, Ethyl (B) 373 (H) 0 - 9 mg/dL  Drug screen panel, emergency  Result Value Ref Range   Opiates NONE DETECTED NONE DETECTED   Cocaine NONE DETECTED NONE DETECTED   Benzodiazepines NONE DETECTED NONE DETECTED   Amphetamines NONE DETECTED NONE DETECTED   Tetrahydrocannabinol NONE DETECTED NONE DETECTED   Barbiturates NONE DETECTED NONE DETECTED  CBC with Differential  Result Value Ref Range   WBC 4.0 4.0 - 10.5 K/uL   RBC 3.78 (L) 4.22 - 5.81 MIL/uL   Hemoglobin 13.6 13.0 - 17.0 g/dL   HCT 38.2 (L) 39.0 - 52.0 %   MCV 101.1 (H) 78.0 - 100.0 fL   MCH 36.0 (H) 26.0 - 34.0 pg   MCHC 35.6 30.0 - 36.0 g/dL   RDW 14.8 11.5 - 15.5 %   Platelets 223 150 - 400 K/uL   Neutrophils Relative % 42 (L) 43 - 77 %   Neutro Abs 1.7 1.7 - 7.7 K/uL   Lymphocytes Relative 39 12 - 46 %   Lymphs Abs 1.6 0.7 - 4.0 K/uL   Monocytes Relative 12 3 - 12 %  Monocytes Absolute 0.5 0.1 - 1.0 K/uL   Eosinophils Relative 5 0 - 5 %   Eosinophils Absolute 0.2 0.0 - 0.7 K/uL   Basophils Relative 2 (H) 0 - 1 %   Basophils Absolute 0.1 0.0 - 0.1 K/uL  Comprehensive metabolic panel  Result Value Ref Range   Sodium 137 135 - 145 mmol/L   Potassium 3.5 3.5 - 5.1 mmol/L   Chloride 100 96 - 112 mEq/L   CO2 26 19 - 32 mmol/L   Glucose, Bld 89 70 - 99 mg/dL   BUN <5 (L) 6 - 23 mg/dL   Creatinine, Ser 0.71 0.50 - 1.35 mg/dL   Calcium 9.2 8.4 - 10.5 mg/dL   Total Protein 7.3 6.0 - 8.3 g/dL   Albumin 4.4 3.5 - 5.2 g/dL   AST 79 (H) 0 - 37 U/L   ALT 36 0 - 53 U/L   Alkaline Phosphatase 61 39 - 117 U/L   Total Bilirubin 0.3 0.3 - 1.2 mg/dL   GFR calc non Af Amer >90 >90 mL/min   GFR calc Af Amer >90 >90 mL/min   Anion gap 11 5 - 15  Acetaminophen level  Result Value Ref Range   Acetaminophen (Tylenol), Serum <10.0 (L) 10 - 30 ug/mL  Salicylate level  Result Value Ref Range   Salicylate Lvl <8.7 2.8 - 20.0 mg/dL  I-stat troponin, ED   Result Value Ref Range   Troponin i, poc 0.00 0.00 - 0.08 ng/mL   Comment 3            Labs Review Labs Reviewed  ETHANOL - Abnormal; Notable for the following:    Alcohol, Ethyl (B) 373 (*)    All other components within normal limits  CBC WITH DIFFERENTIAL - Abnormal; Notable for the following:    RBC 3.78 (*)    HCT 38.2 (*)    MCV 101.1 (*)    MCH 36.0 (*)    Neutrophils Relative % 42 (*)    Basophils Relative 2 (*)    All other components within normal limits  COMPREHENSIVE METABOLIC PANEL - Abnormal; Notable for the following:    BUN <5 (*)    AST 79 (*)    All other components within normal limits  ACETAMINOPHEN LEVEL - Abnormal; Notable for the following:    Acetaminophen (Tylenol), Serum <10.0 (*)    All other components within normal limits  URINE RAPID DRUG SCREEN (HOSP PERFORMED)  SALICYLATE LEVEL  I-STAT TROPOININ, ED    Imaging Review No results found.   EKG Interpretation   Date/Time:  Tuesday August 25 2014 22:54:53 EST Ventricular Rate:  83 PR Interval:  182 QRS Duration: 96 QT Interval:  392 QTC Calculation: 460 R Axis:   66 Text Interpretation:  Normal sinus rhythm Normal ECG Sinus rhythm Normal  ECG Confirmed by Carmin Muskrat  MD ([id number]) on 08/26/2014 12:28:13 AM      10:01 PM Patient seen and assessed by attending physician, Dr. Kirby Funk. Did not recommend CIWA at this time since intoxicated currently. Recommended for patient to sober up and be re-evaluated in the morning.   1:47 AM This provider re-assessed the patient. Patient sleeping comfortable in the bed - negative signs of respiratory distress. Patient easily awoken. Patient knows who he is, where he is, the year, and why he is here. Negative slurred speech. Good eye contact.    1:59 AM Spoke with Dr. Peggye Pitt regarding case. Recommended CIWA protocol to be started at  this time. Recommended TTS consult to be performed. Dr. Roxanne Mins to take over care.    MDM   Final diagnoses:   Alcohol intoxication, with unspecified complication  Alcohol abuse  Depression    Medications  LORazepam (ATIVAN) tablet 0-4 mg (0 mg Oral Not Given 08/26/14 0221)    Followed by  LORazepam (ATIVAN) tablet 0-4 mg (not administered)  sodium chloride 0.9 % bolus 1,000 mL (0 mLs Intravenous Stopped 08/25/14 2316)   Filed Vitals:   08/26/14 0000 08/26/14 0045 08/26/14 0130 08/26/14 0215  BP: 104/67 105/67 110/66 106/67  Pulse: 97 85 95 89  Temp:      TempSrc:      Resp: _0 SpO2: 90% 94% 95% 91%   EKG normal sinus rhythm with a heart rate of 83 bpm. I-STAT troponin negative elevation. CBC negative elevated leukocytosis. Hemoglobin 13.6, hematocrit 38.2. CMP unremarkable-mildly elevated AST is 79. Ethanol 373. Acetaminophen and salicylate level negative elevation. Urine drug screen unremarkable. Patient presenting to the ED with alcohol intoxication. Patient has a long history of alcohol abuse and dependency. Patient requesting inpatient detox. Given IV fluids in the ED setting. Patient seen and assessed by attending physician who recommended patient to be monitored and to sober up, recommended patient to be reevaluated in the morning-will most likely need psych. Discussed case with Dr. Peggye Pitt, overnight physician in great detail. Transfer of care to Dr. Roxanne Mins at change in shift.   Jamse Mead, PA-C 08/26/14 [id number]  Carmin Muskrat, MD 08/26/14 [phone]

## 2014-08-26 LAB — SALICYLATE LEVEL: Salicylate Lvl: <4 mg/dL (ref 2.8–20.0)

## 2014-08-26 LAB — ETHANOL: Alcohol, Ethyl (B): 90 mg/dL — ABNORMAL HIGH (ref 0–9)

## 2014-08-26 LAB — ACETAMINOPHEN LEVEL: Acetaminophen (Tylenol), Serum: <10 ug/mL — ABNORMAL LOW (ref 10–30)

## 2014-08-26 MED ORDER — VITAMIN B-1 100 MG PO TABS: 100.0000 mg | ORAL_TABLET | Freq: Every day | ORAL | Status: DC

## 2014-08-26 MED ORDER — LORAZEPAM 2 MG/ML IJ SOLN: 1.0000 mg | Freq: Four times a day (QID) | INTRAMUSCULAR | Status: DC | PRN

## 2014-08-26 MED ORDER — LORAZEPAM 1 MG PO TABS: 0.0000 mg | ORAL_TABLET | Freq: Four times a day (QID) | ORAL | Status: DC

## 2014-08-26 MED ORDER — LORAZEPAM 1 MG PO TABS: 0.0000 mg | ORAL_TABLET | Freq: Two times a day (BID) | ORAL | Status: DC

## 2014-08-26 MED ORDER — THIAMINE HCL 100 MG/ML IJ SOLN: 100.0000 mg | Freq: Every day | INTRAMUSCULAR | Status: DC

## 2014-08-26 MED ORDER — ADULT MULTIVITAMIN W/MINERALS CH: 1.0000 | ORAL_TABLET | Freq: Every day | ORAL | Status: DC

## 2014-08-26 MED ORDER — LORAZEPAM 1 MG PO TABS
1.0000 mg | ORAL_TABLET | Freq: Four times a day (QID) | ORAL | Status: DC | PRN
  Administered 2014-08-26: 1 mg via ORAL
  Filled 2014-08-26: qty 1

## 2014-08-26 MED ORDER — FOLIC ACID 1 MG PO TABS: 1.0000 mg | ORAL_TABLET | Freq: Every day | ORAL | Status: DC

## 2014-08-26 NOTE — ED Notes (Signed)
[facility] called to transport pt to [facility] 

## 2014-08-26 NOTE — ED Notes (Signed)
[facility] called with bed available at [facility].

## 2014-08-26 NOTE — ED Notes (Signed)
Pt. States he has been drinking since the age of 49. Usually drinks 5-6 40 oz. Beers a day. Yesterday pt. Endorses drinking about 3.5 40 oz. Beers. Pt. States he went to detox about 3 years ago. Denies any extended periods of sobriety.

## 2014-08-26 NOTE — BH Assessment (Addendum)
Tele Assessment Note   [name] is a 49 y.o. male who voluntarily [facility].  Pt denies SI/HI/AVH, stating that he experiences substance induced hallucinations.  Pt reports he drinks 5-6 40's, daily.  His last drink was 08/25/13, he consumed 3-40's. He denies any current w/d sxs; BAL is [id number](1931).  Pt denies seizures/blackouts, no legal issues.  Pt reports previous treatment with [facility](3 yrs ago) and [facility].    Axis I: Alcohol Use D/O, Severe  Axis II: Deferred Axis III:  Past Medical History  Diagnosis Date  . Hypertension   . Shortness of breath   . COPD (chronic obstructive pulmonary disease)   . Depression   . Alcohol abuse    Axis IV: other psychosocial or environmental problems, problems related to social environment and problems with primary support group Axis V: 41-50 serious symptoms  Past Medical History:  Past Medical History  Diagnosis Date  . Hypertension   . Shortness of breath   . COPD (chronic obstructive pulmonary disease)   . Depression   . Alcohol abuse     Past Surgical History  Procedure Laterality Date  . Hemorrhoid surgery    . Mandible fracture surgery    . Back surgery    . Hernia repair      Family History:  Family History  Problem Relation Age of Onset  . Hypertension Mother   . Hypertension Father   . Hypertension Brother     Social History:  reports that he has been smoking Cigarettes.  He has been smoking about 2.00 packs per day. He has never used smokeless tobacco. He reports that he drinks about 6.0 oz of alcohol per week. He reports that he uses illicit drugs (Cocaine and Marijuana).  Additional Social History:     CIWA: CIWA-Ar BP: 106/67 mmHg Pulse Rate: 89 Nausea and Vomiting: no nausea and no vomiting Tactile Disturbances: none Tremor: no tremor Auditory Disturbances: not present Paroxysmal Sweats: no sweat visible Visual Disturbances: not present Anxiety: no anxiety, at  ease Headache, Fullness in Head: none present Agitation: normal activity Orientation and Clouding of Sensorium: oriented and can do serial additions CIWA-Ar Total: 0 COWS:    PATIENT STRENGTHS: (choose at least two) Motivation for treatment/growth  Allergies: No Known Allergies  Home Medications:  (Not in a hospital admission)  OB/GYN Status:  No LMP for male patient.  General Assessment Data Admission Status: Voluntary           Risk to self with the past 6 months Is patient at risk for suicide?: No Substance abuse history and/or treatment for substance abuse?: Yes        Mental Status Report Motor Activity: Unremarkable                            Advance Directives (For Healthcare) Does patient have an advance directive?: No          Disposition:     [name], [name] 08/26/2014 2:22 AM

## 2014-08-26 NOTE — BH Assessment (Signed)
Pt accepted to [facility] per Cordelia PocheGreg Getsinger at [facility]. Notified pt's ED nurse Redmond BasemanHayden whom will coordinate patient transport to [facility].    Glorious PeachNajah Lyndell Gillyard, MS, LCASA Assessment Counselor

## 2014-08-26 NOTE — ED Notes (Addendum)
Patient awake. He complains of feeling very shaky. md made aware. He has eaten 100% of his breakfast

## 2014-08-26 NOTE — ED Notes (Signed)
[facility] has arrived to transport pt. All belongings sent with patient

## 2014-08-26 NOTE — Discharge Instructions (Signed)
Patient accepted by detox facility. Will be transported via [facility]. Patient going to [facility].

## 2014-08-26 NOTE — ED Provider Notes (Signed)
Patient has tentatively been accepted at [facility] but they do wish his alcohol level to be considerably lower. He'll be kept in the ED and alcohol level repeated.  Danny Boozeavid Ladamien Rammel, MD 08/26/14 [phone]

## 2015-09-02 ENCOUNTER — Emergency Department (HOSPITAL_COMMUNITY)
Admission: EM | Admit: 2015-09-02 | Discharge: 2015-09-02 | Discharge status: Against Medical Advice | Payer: Medicare Other | Attending: Emergency Medicine | Admitting: Emergency Medicine

## 2015-09-02 ENCOUNTER — Emergency Department (HOSPITAL_COMMUNITY): Payer: Medicare Other

## 2015-09-02 ENCOUNTER — Encounter (HOSPITAL_COMMUNITY): Payer: Self-pay | Admitting: Emergency Medicine

## 2015-09-02 DIAGNOSIS — M1990 Unspecified osteoarthritis, unspecified site: Secondary | ICD-10-CM | POA: Diagnosis not present

## 2015-09-02 DIAGNOSIS — F10129 Alcohol abuse with intoxication, unspecified: Secondary | ICD-10-CM | POA: Diagnosis not present

## 2015-09-02 DIAGNOSIS — R05 Cough: Secondary | ICD-10-CM | POA: Insufficient documentation

## 2015-09-02 DIAGNOSIS — J449 Chronic obstructive pulmonary disease, unspecified: Secondary | ICD-10-CM | POA: Insufficient documentation

## 2015-09-02 DIAGNOSIS — R0602 Shortness of breath: Secondary | ICD-10-CM | POA: Insufficient documentation

## 2015-09-02 DIAGNOSIS — I10 Essential (primary) hypertension: Secondary | ICD-10-CM | POA: Diagnosis not present

## 2015-09-02 NOTE — ED Notes (Addendum)
Pt c/o generalized pain x 1.5 weeks, cough with yellow/green sputum, SOB, arthritis. Pt currently intoxicated, states he had 1 forty ounce bottle of alcohol today. Pt states he has been SOB with cough x 1.5 years. Pt states he needs an inhaler.

## 2015-09-02 NOTE — ED Notes (Signed)
Bed: WTR8 Expected date:  Expected time:  Means of arrival:  Comments: EMS 

## 2015-09-02 NOTE — ED Notes (Signed)
Called for xray x 3

## 2015-09-02 NOTE — ED Notes (Signed)
Called for xray x 2

## 2015-09-02 NOTE — ED Notes (Signed)
Called for xray x 1

## 2015-09-16 ENCOUNTER — Encounter (HOSPITAL_COMMUNITY): Payer: Self-pay | Admitting: Emergency Medicine

## 2015-09-16 ENCOUNTER — Emergency Department (HOSPITAL_COMMUNITY): Payer: Medicare Other

## 2015-09-16 ENCOUNTER — Emergency Department (HOSPITAL_COMMUNITY)
Admission: EM | Admit: 2015-09-16 | Discharge: 2015-09-16 | Disposition: A | Discharge status: Home / Self Care | Payer: Medicare Other | Attending: Emergency Medicine | Admitting: Emergency Medicine

## 2015-09-16 DIAGNOSIS — Z7952 Long term (current) use of systemic steroids: Secondary | ICD-10-CM | POA: Diagnosis not present

## 2015-09-16 DIAGNOSIS — F17210 Nicotine dependence, cigarettes, uncomplicated: Secondary | ICD-10-CM | POA: Diagnosis not present

## 2015-09-16 DIAGNOSIS — I10 Essential (primary) hypertension: Secondary | ICD-10-CM | POA: Diagnosis not present

## 2015-09-16 DIAGNOSIS — J441 Chronic obstructive pulmonary disease with (acute) exacerbation: Secondary | ICD-10-CM | POA: Insufficient documentation

## 2015-09-16 DIAGNOSIS — Z79899 Other long term (current) drug therapy: Secondary | ICD-10-CM | POA: Diagnosis not present

## 2015-09-16 DIAGNOSIS — Z8659 Personal history of other mental and behavioral disorders: Secondary | ICD-10-CM | POA: Diagnosis not present

## 2015-09-16 DIAGNOSIS — R0602 Shortness of breath: Secondary | ICD-10-CM | POA: Diagnosis present

## 2015-09-16 LAB — CBC
HCT: 36.7 % — ABNORMAL LOW (ref 39.0–52.0)
Hemoglobin: 12.7 g/dL — ABNORMAL LOW (ref 13.0–17.0)
MCH: 34.4 pg — ABNORMAL HIGH (ref 26.0–34.0)
MCHC: 34.6 g/dL (ref 30.0–36.0)
MCV: 99.5 fL (ref 78.0–100.0)
Platelets: 136 10*3/uL — ABNORMAL LOW (ref 150–400)
RBC: 3.69 MIL/uL — ABNORMAL LOW (ref 4.22–5.81)
RDW: 14.8 % (ref 11.5–15.5)
WBC: 3.6 10*3/uL — ABNORMAL LOW (ref 4.0–10.5)

## 2015-09-16 LAB — BASIC METABOLIC PANEL
Anion gap: 12 (ref 5–15)
BUN: <5 mg/dL — ABNORMAL LOW (ref 6–20)
CHLORIDE: 102 mmol/L (ref 101–111)
CO2: 29 mmol/L (ref 22–32)
CREATININE: 0.49 mg/dL — AB (ref 0.61–1.24)
Calcium: 9 mg/dL (ref 8.9–10.3)
GFR calc non Af Amer: >60 mL/min (ref >60)
Glucose, Bld: 86 mg/dL (ref 65–99)
Potassium: 3.9 mmol/L (ref 3.5–5.1)
Sodium: 143 mmol/L (ref 135–145)

## 2015-09-16 LAB — I-STAT TROPONIN, ED: Troponin i, poc: 0 ng/mL (ref 0.00–0.08)

## 2015-09-16 MED ORDER — IPRATROPIUM-ALBUTEROL 0.5-2.5 (3) MG/3ML IN SOLN: 3.0000 mL | Freq: Four times a day (QID) | RESPIRATORY_TRACT | Status: DC

## 2015-09-16 MED ORDER — PREDNISONE 50 MG PO TABS
50.0000 mg | ORAL_TABLET | Freq: Once | ORAL | Status: AC
  Administered 2015-09-16: 50 mg via ORAL
  Filled 2015-09-16: qty 1

## 2015-09-16 MED ORDER — ALBUTEROL SULFATE HFA 108 (90 BASE) MCG/ACT IN AERS: 1.0000 | INHALATION_SPRAY | Freq: Four times a day (QID) | RESPIRATORY_TRACT | Status: DC | PRN

## 2015-09-16 MED ORDER — PREDNISONE 10 MG PO TABS: 20.0000 mg | ORAL_TABLET | Freq: Two times a day (BID) | ORAL | Status: DC

## 2015-09-16 MED ORDER — ALBUTEROL SULFATE (2.5 MG/3ML) 0.083% IN NEBU
2.5000 mg | INHALATION_SOLUTION | Freq: Once | RESPIRATORY_TRACT | Status: AC
  Administered 2015-09-16: 2.5 mg via RESPIRATORY_TRACT
  Filled 2015-09-16: qty 3

## 2015-09-16 NOTE — Discharge Instructions (Signed)
Please read and follow all provided instructions.  Your diagnoses today include:  1. COPD exacerbation (HCC)    Tests performed today include:  Chest x-ray - did not show pneumonia  Vital signs. See below for your results today.   Medications prescribed:   Take any prescribed medications only as directed.  Home care instructions:  Follow any educational materials contained in this packet.  Follow-up instructions: Please follow-up with your primary care provider in the next 3 days for further evaluation of your symptoms and management of your COPD.   Return instructions:   Please return to the Emergency Department if you experience worsening symptoms.  Please return with worsening wheezing, shortness of breath, or difficulty breathing.  Return with persistent fever above 101F.   Please return if you have any other emergent concerns.  Additional Information:  Your vital signs today were: BP 128/108 mmHg   Pulse 98   Temp(Src) 98.3 F (36.8 C) (Oral)   Resp 18   SpO2 94% If your blood pressure (BP) was elevated above 135/85 this visit, please have this repeated by your doctor within one month. --------------

## 2015-09-16 NOTE — ED Notes (Signed)
Bed: WA17 Expected date:  Expected time:  Means of arrival:  Comments: EMS- SOB/COPD

## 2015-09-16 NOTE — ED Provider Notes (Signed)
CSN: [id number]     Arrival date & time 09/16/15  67 History   First MD Initiated Contact with Patient 09/16/15 1606     Chief Complaint  Patient presents with  . Shortness of Breath   (Consider location/radiation/quality/duration/timing/severity/associated sxs/prior Treatment) HPI 50 y.o. male with a hx of COPD, ETOH Abuse presents to the Emergency Department today complaining of shortness of breath for the past few days. Notes that he has COPD and has been unable to fill his albuterol prescription recently. Noticed a productive cough. No fevers. No hemoptysis. Pt does not endorse any chest pan. He has been having chest tightness. Has been occuring with exertion and rest. He has PND and used 1 pillow to aid in sleeping at night. No abd pain. No headache. No other symptoms noted.       Past Medical History  Diagnosis Date  . Hypertension   . Shortness of breath   . COPD (chronic obstructive pulmonary disease) ([place])   . Depression   . Alcohol abuse    Past Surgical History  Procedure Laterality Date  . Hemorrhoid surgery    . Mandible fracture surgery    . Back surgery    . Hernia repair     Family History  Problem Relation Age of Onset  . Hypertension Mother   . Hypertension Father   . Hypertension Brother    Social History  Substance Use Topics  . Smoking status: Current Every Day Smoker -- 2.00 packs/day    Types: Cigarettes  . Smokeless tobacco: Never Used  . Alcohol Use: 6.0 oz/week    10 Cans of beer per week     Comment: 6- 40's daily     Review of Systems ROS reviewed and all are negative for acute change except as noted in the HPI.  Allergies  Review of patient's allergies indicates no known allergies.  Home Medications   Prior to Admission medications   Medication Sig Start Date End Date Taking? Authorizing Provider  albuterol (PROVENTIL HFA;VENTOLIN HFA) 108 (90 BASE) MCG/ACT inhaler Inhale 2 puffs into the lungs every 6 (six) hours as needed for  wheezing or shortness of breath (wheezing).  06/19/13  Yes Gregor Hams, MD  Aspirin-Salicylamide-Caffeine [facility] HEADACHE POWDER PO) Take 1 packet by mouth 2 (two) times daily as needed (pain).    Yes Historical Provider, MD  beclomethasone (QVAR) 80 MCG/ACT inhaler Inhale 2 puffs into the lungs 2 (two) times daily.   Yes Historical Provider, MD  oxyCODONE-acetaminophen (PERCOCET/ROXICET) 5-325 MG per tablet Take 1-2 tablets by mouth every 6 (six) hours as needed for severe pain. Patient not taking: Reported on 08/25/2014 06/08/14   Alfonzo Beers, MD  PredniSONE 10 MG KIT 6-day Patient not taking: Reported on 08/25/2014 06/30/14   Elyse Jarvis Withrow, FNP   BP 120/91 mmHg  Pulse 86  Temp(Src) 98.3 F (36.8 C) (Oral)  Resp 94  SpO2 94%   Physical Exam  Constitutional: He is oriented to person, place, and time. He appears well-developed and well-nourished.  HENT:  Head: Normocephalic and atraumatic.  Nose: Nose normal.  Mouth/Throat: Uvula is midline, oropharynx is clear and moist and mucous membranes are normal.  Eyes: EOM are normal.  Neck: Normal range of motion. Neck supple.  Cardiovascular: Normal rate, regular rhythm, S1 normal, S2 normal and normal heart sounds.   Pulmonary/Chest: Effort normal. He has no decreased breath sounds. He has wheezes in the right upper field, the right lower field, the left upper field and  the left lower field. He has no rhonchi. He has no rales.  Abdominal: Soft. Normal appearance and bowel sounds are normal. There is no tenderness.  Musculoskeletal: Normal range of motion.  Neurological: He is alert and oriented to person, place, and time.  Skin: Skin is warm and dry.  Psychiatric: He has a normal mood and affect. His behavior is normal. Thought content normal.  Nursing note and vitals reviewed.  ED Course  Procedures (including critical care time) Labs Review Labs Reviewed  CBC - Abnormal; Notable for the following:    WBC 3.6 (*)    RBC 3.69 (*)     Hemoglobin 12.7 (*)    HCT 36.7 (*)    MCH 34.4 (*)    Platelets 136 (*)    All other components within normal limits  BASIC METABOLIC PANEL - Abnormal; Notable for the following:    BUN <5 (*)    Creatinine, Ser 0.49 (*)    All other components within normal limits  I-STAT TROPOININ, ED   Imaging Review Dg Chest 2 View  09/16/2015  CLINICAL DATA:  Shortness of breath, generalized pain and cough for 2 months, history hypertension, COPD, smoking EXAM: CHEST  2 VIEW COMPARISON:  06/29/2014 FINDINGS: Upper normal heart size. Mediastinal contours and pulmonary vascularity normal. Emphysematous changes consistent with COPD. No acute infiltrate, pleural effusion, or pneumothorax. Bones unremarkable. IMPRESSION: Emphysematous changes consistent with COPD. No acute abnormalities. Electronically Signed   By: Lavonia Dana M.D.   On: 09/16/2015 16:46   I have personally reviewed and evaluated these images and lab results as part of my medical decision-making.   EKG Interpretation None     MDM  I have reviewed relevant laboratory values. I have reviewed relevant imaging studies. I personally interpreted the relevant EKG. I have reviewed the relevant previous healthcare records. I obtained HPI from historian. Patient discussed with supervising physician  ED Course:  Assessment: 24y M with hx COPD presents with worsening SOB for the past few days. Unable to refill his albuterol inhaler. Has productive cough. Afebrile. Given neb treatment in ED, which relieved symptoms. CXR revealed findings consistent with COPD. No Pneumonia. Trop negative. Perc neg. Will dc home with albuterol inhaler and prednisone x5days. Will have pt follow up with PCP for further management.       Disposition/Plan:  DC Home Additional Verbal discharge instructions given and discussed with patient.  Pt Instructed to f/u with PCP in the next 48-72 hours for evaluation and treatment of symptoms. Return precautions given Pt  acknowledges and agrees with plan   Supervising Physician Virgel Manifold, MD   Final diagnoses:  COPD exacerbation [facility])        Shary Decamp, PA-C 09/16/15 [place], MD 09/17/15 1537

## 2015-09-16 NOTE — ED Notes (Addendum)
Per EMS: pt c/o subjective SOB, generalized pain and cough x 2 months. Pt appears to be in NAD. Pt has hx of COPD and hx, lung fields clear. Coughing up clear phlegm. Pt non-compliant with meds.

## 2015-09-16 NOTE — ED Notes (Signed)
Verbalized understanding discharge instructions, prescriptions, and follow-up. In no acute distress.  Pt given a bus pass.

## 2015-11-01 ENCOUNTER — Emergency Department (HOSPITAL_COMMUNITY): Payer: Medicare HMO

## 2015-11-01 ENCOUNTER — Emergency Department (HOSPITAL_COMMUNITY)
Admission: EM | Admit: 2015-11-01 | Discharge: 2015-11-02 | Disposition: A | Discharge status: Home / Self Care | Payer: Medicare HMO | Attending: Emergency Medicine | Admitting: Emergency Medicine

## 2015-11-01 ENCOUNTER — Encounter (HOSPITAL_COMMUNITY): Payer: Self-pay | Admitting: Vascular Surgery

## 2015-11-01 DIAGNOSIS — R197 Diarrhea, unspecified: Secondary | ICD-10-CM | POA: Diagnosis not present

## 2015-11-01 DIAGNOSIS — J441 Chronic obstructive pulmonary disease with (acute) exacerbation: Secondary | ICD-10-CM | POA: Insufficient documentation

## 2015-11-01 DIAGNOSIS — R000 Tachycardia, unspecified: Secondary | ICD-10-CM | POA: Diagnosis not present

## 2015-11-01 DIAGNOSIS — F1010 Alcohol abuse, uncomplicated: Secondary | ICD-10-CM | POA: Insufficient documentation

## 2015-11-01 DIAGNOSIS — R069 Unspecified abnormalities of breathing: Secondary | ICD-10-CM | POA: Diagnosis not present

## 2015-11-01 DIAGNOSIS — R112 Nausea with vomiting, unspecified: Secondary | ICD-10-CM | POA: Insufficient documentation

## 2015-11-01 DIAGNOSIS — R05 Cough: Secondary | ICD-10-CM | POA: Diagnosis not present

## 2015-11-01 DIAGNOSIS — R251 Tremor, unspecified: Secondary | ICD-10-CM | POA: Diagnosis not present

## 2015-11-01 DIAGNOSIS — I10 Essential (primary) hypertension: Secondary | ICD-10-CM | POA: Insufficient documentation

## 2015-11-01 DIAGNOSIS — F17210 Nicotine dependence, cigarettes, uncomplicated: Secondary | ICD-10-CM | POA: Diagnosis not present

## 2015-11-01 DIAGNOSIS — J159 Unspecified bacterial pneumonia: Secondary | ICD-10-CM | POA: Diagnosis not present

## 2015-11-01 DIAGNOSIS — Z8659 Personal history of other mental and behavioral disorders: Secondary | ICD-10-CM | POA: Insufficient documentation

## 2015-11-01 DIAGNOSIS — R0602 Shortness of breath: Secondary | ICD-10-CM | POA: Diagnosis not present

## 2015-11-01 DIAGNOSIS — J189 Pneumonia, unspecified organism: Secondary | ICD-10-CM | POA: Diagnosis not present

## 2015-11-01 DIAGNOSIS — F10220 Alcohol dependence with intoxication, uncomplicated: Secondary | ICD-10-CM | POA: Diagnosis not present

## 2015-11-01 LAB — CBC
HEMATOCRIT: 34.3 % — AB (ref 39.0–52.0)
Hemoglobin: 12 g/dL — ABNORMAL LOW (ref 13.0–17.0)
MCH: 33.8 pg (ref 26.0–34.0)
MCHC: 35 g/dL (ref 30.0–36.0)
MCV: 96.6 fL (ref 78.0–100.0)
Platelets: 170 10*3/uL (ref 150–400)
RBC: 3.55 MIL/uL — AB (ref 4.22–5.81)
RDW: 15.5 % (ref 11.5–15.5)
WBC: 5 10*3/uL (ref 4.0–10.5)

## 2015-11-01 LAB — BASIC METABOLIC PANEL
ANION GAP: 14 (ref 5–15)
BUN: 7 mg/dL (ref 6–20)
CHLORIDE: 105 mmol/L (ref 101–111)
CO2: 26 mmol/L (ref 22–32)
Calcium: 8.6 mg/dL — ABNORMAL LOW (ref 8.9–10.3)
Creatinine, Ser: 0.52 mg/dL — ABNORMAL LOW (ref 0.61–1.24)
GFR calc Af Amer: >60 mL/min (ref >60)
GFR calc non Af Amer: >60 mL/min (ref >60)
GLUCOSE: 99 mg/dL (ref 65–99)
POTASSIUM: 3.7 mmol/L (ref 3.5–5.1)
Sodium: 145 mmol/L (ref 135–145)

## 2015-11-01 LAB — I-STAT TROPONIN, ED: Troponin i, poc: 0 ng/mL (ref 0.00–0.08)

## 2015-11-01 MED ORDER — IPRATROPIUM-ALBUTEROL 0.5-2.5 (3) MG/3ML IN SOLN
3.0000 mL | Freq: Once | RESPIRATORY_TRACT | Status: AC
  Administered 2015-11-02: 3 mL via RESPIRATORY_TRACT
  Filled 2015-11-01: qty 3

## 2015-11-01 MED ORDER — LORAZEPAM 2 MG/ML IJ SOLN
0.0000 mg | Freq: Four times a day (QID) | INTRAMUSCULAR | Status: DC
  Administered 2015-11-02: 1 mg via INTRAVENOUS
  Filled 2015-11-01: qty 1

## 2015-11-01 MED ORDER — METHYLPREDNISOLONE SODIUM SUCC 125 MG IJ SOLR
125.0000 mg | Freq: Once | INTRAMUSCULAR | Status: AC
  Administered 2015-11-02: 125 mg via INTRAVENOUS
  Filled 2015-11-01: qty 2

## 2015-11-01 MED ORDER — THIAMINE HCL 100 MG/ML IJ SOLN
Freq: Once | INTRAVENOUS | Status: AC
  Administered 2015-11-02: 01:00:00 via INTRAVENOUS
  Filled 2015-11-01: qty 1000

## 2015-11-01 MED ORDER — SODIUM CHLORIDE 0.9 % IV BOLUS (SEPSIS)
1000.0000 mL | Freq: Once | INTRAVENOUS | Status: AC
  Administered 2015-11-01: 1000 mL via INTRAVENOUS

## 2015-11-01 MED ORDER — DEXTROSE 5 % IV SOLN
500.0000 mg | Freq: Once | INTRAVENOUS | Status: AC
  Administered 2015-11-02: 500 mg via INTRAVENOUS
  Filled 2015-11-01: qty 500

## 2015-11-01 MED ORDER — DEXTROSE 5 % IV SOLN
1.0000 g | Freq: Once | INTRAVENOUS | Status: AC
  Administered 2015-11-02: 1 g via INTRAVENOUS
  Filled 2015-11-01: qty 10

## 2015-11-01 MED ORDER — LORAZEPAM 2 MG/ML IJ SOLN
0.0000 mg | Freq: Two times a day (BID) | INTRAMUSCULAR | Status: DC
Start: 2015-11-04 — End: 2015-11-02

## 2015-11-01 NOTE — ED Notes (Signed)
Dr. Horton at bedside. 

## 2015-11-01 NOTE — ED Notes (Signed)
Pt reports to the ED for eval of SOB x several weeks. Pt has hx of COPD and asthma. Pt also reports CP and coughing. Upon EMS arrival pt was using accessory muscles to breath and very tachypnic. Pt drinks 4-5 fifths per day last drink just PTA. Pt A&Ox 3, thought the month was February, resp e/u, and skin warm and dry.

## 2015-11-01 NOTE — ED Notes (Signed)
Pt states that he drinks about 5 40 oz beers a day, last drink was around 4 pm.

## 2015-11-01 NOTE — ED Provider Notes (Signed)
CSN: [id number]     Arrival date & time 11/01/15  2102 History  By signing my name below, I, Bethel Born, attest that this documentation has been prepared under the direction and in the presence of Shon Baton, MD. Electronically Signed: Bethel Born, ED Scribe. 11/01/2015. 11:39 PM   Chief Complaint  Patient presents with  . Shortness of Breath   The history is provided by the patient. No language interpreter was used.   [name] is a 50 y.o. male with PMHx of COPD, asthma, and HTN who presents to the Emergency Department complaining of constant SOB with onset "a long time ago". His home inhalers provide some relief in symptoms but his breathing worsened today.  Associated symptoms include chills, cough, n/v/d. Pt denies fever. He is a daily drinker and last used alcohol near 8 hours ago. Pt states that he typically drinks five 40 oz beers per day and has had withdrawal symptoms in previous periods of abstinence. He has had no recent hospitalizations.    Past Medical History  Diagnosis Date  . Hypertension   . Shortness of breath   . COPD (chronic obstructive pulmonary disease) (HCC)   . Depression   . Alcohol abuse    Past Surgical History  Procedure Laterality Date  . Hemorrhoid surgery    . Mandible fracture surgery    . Back surgery    . Hernia repair     Family History  Problem Relation Age of Onset  . Hypertension Mother   . Hypertension Father   . Hypertension Brother    Social History  Substance Use Topics  . Smoking status: Current Every Day Smoker -- 2.00 packs/day    Types: Cigarettes  . Smokeless tobacco: Never Used  . Alcohol Use: 6.0 oz/week    10 Cans of beer per week     Comment: 6- 40's daily     Review of Systems  Constitutional: Positive for chills. Negative for fever.  Respiratory: Positive for cough and shortness of breath.   Gastrointestinal: Positive for nausea, vomiting and diarrhea.  Neurological: Positive for tremors.   All other systems reviewed and are negative.   Allergies  Review of patient's allergies indicates no known allergies.  Home Medications   Prior to Admission medications   Medication Sig Start Date End Date Taking? Authorizing Provider  albuterol (PROVENTIL HFA;VENTOLIN HFA) 108 (90 Base) MCG/ACT inhaler Inhale 2 puffs into the lungs every 4 (four) hours as needed for wheezing or shortness of breath. 11/02/15   Shon Baton, MD  levofloxacin (LEVAQUIN) 500 MG tablet Take 1 tablet (500 mg total) by mouth daily. 11/02/15   Shon Baton, MD  oxyCODONE-acetaminophen (PERCOCET/ROXICET) 5-325 MG per tablet Take 1-2 tablets by mouth every 6 (six) hours as needed for severe pain. Patient not taking: Reported on 08/25/2014 06/08/14   Jerelyn Scott, MD  predniSONE (DELTASONE) 20 MG tablet Take 2 tablets (40 mg total) by mouth daily with breakfast. 11/02/15   Shon Baton, MD   BP 108/93 mmHg  Pulse 108  Temp(Src) 98.7 F (37.1 C) (Oral)  Resp 18  SpO2 93% Physical Exam  Constitutional: He is oriented to person, place, and time. No distress.  Appears intoxicated  HENT:  Head: Normocephalic and atraumatic.  Mouth/Throat: Oropharynx is clear and moist.  Eyes: Pupils are equal, round, and reactive to light.  Cardiovascular: Regular rhythm and normal heart sounds.   No murmur heard. tachycardia  Pulmonary/Chest: Effort normal. No respiratory distress.  He has wheezes. He has no rales.  Abdominal: Soft. Bowel sounds are normal. There is no tenderness. There is no rebound.  Musculoskeletal: He exhibits no edema.  Neurological: He is alert and oriented to person, place, and time.  Skin: Skin is warm and dry.  Nursing note and vitals reviewed.   ED Course  Procedures (including critical care time) DIAGNOSTIC STUDIES: Oxygen Saturation is 93% on RA, adequate by my interpretation.    COORDINATION OF CARE: 11:37 PM Discussed treatment plan which includes lab work, CXR, EKG with pt  at bedside and pt agreed to plan.  Labs Review Labs Reviewed  BASIC METABOLIC PANEL - Abnormal; Notable for the following:    Creatinine, Ser 0.52 (*)    Calcium 8.6 (*)    All other components within normal limits  CBC - Abnormal; Notable for the following:    RBC 3.55 (*)    Hemoglobin 12.0 (*)    HCT 34.3 (*)    All other components within normal limits  CULTURE, BLOOD (ROUTINE X 2)  CULTURE, BLOOD (ROUTINE X 2)  I-STAT TROPOININ, ED  I-STAT CG4 LACTIC ACID, ED    Imaging Review Dg Chest 2 View  11/01/2015  CLINICAL DATA:  Shortness of breath for several weeks. History of COPD and asthma. History of chest pain with coughing. EXAM: CHEST  2 VIEW COMPARISON:  Chest x-rays dated 09/16/2015 06/29/2014. FINDINGS: New ill-defined airspace opacities are present within the right upper lobe and at the left lung base, consistent with pneumonia. There are additional subtle patchy airspace opacities within the right lower lobe, also suggesting pneumonia. Lungs are hyperexpanded compatible with the given history of COPD. No pleural effusion. No pneumothorax seen. Cardiomediastinal silhouette is stable in size and configuration. IMPRESSION: Bilateral pneumonia. COPD. Electronically Signed   By: Bary Richard M.D.   On: 11/01/2015 21:47   I have personally reviewed and evaluated these images and lab results as part of my medical decision-making.   EKG Interpretation   Date/Time:  Monday November 01 2015 21:11:41 EDT Ventricular Rate:  99 PR Interval:  170 QRS Duration: 86 QT Interval:  358 QTC Calculation: 459 R Axis:   52 Text Interpretation:  Normal sinus rhythm Nonspecific ST and T wave  abnormality Abnormal ECG Confirmed by Ilka Lovick  MD, Toni Amend ([id number]) on  11/01/2015 11:11:05 PM      MDM   Final diagnoses:  Community acquired pneumonia  Alcohol abuse    Patient presents with shortness of breath ongoing and progressive. History of COPD and reports cough. Afebrile. Noted to have  mild tachycardia. Otherwise fair air movement but scant wheezing. Chest x-ray is concerning for pneumonia. No significant leukocytosis. Lactate is normal. Blood pressure normal. Patient was given a banana bag and thiamine given his alcohol abuse. He was also given Solu-Medrol and a duo neb for his wheezing and history of COPD. On recheck, wheezing improved. Patient states that he feels better. He remains mildly tachycardic but has also had a duo neb. He was ambulated and maintained his pulse ox greater than 94%. The patient is a candidate for outpatient treatment. Will discharge him with steroids antibiotics and inhaler. Strict return precautions given.  After history, exam, and medical workup I feel the patient has been appropriately medically screened and is safe for discharge home. Pertinent diagnoses were discussed with the patient. Patient was given return precautions.  I personally performed the services described in this documentation, which was scribed in my presence. The recorded information has been  reviewed and is accurate.    Shon Batonourtney F Zonnique Norkus, MD 11/02/15 [phone]

## 2015-11-02 DIAGNOSIS — J159 Unspecified bacterial pneumonia: Secondary | ICD-10-CM | POA: Diagnosis not present

## 2015-11-02 LAB — I-STAT CG4 LACTIC ACID, ED: LACTIC ACID, VENOUS: 1.65 mmol/L (ref 0.5–2.0)

## 2015-11-02 MED ORDER — ALBUTEROL SULFATE HFA 108 (90 BASE) MCG/ACT IN AERS: 2.0000 | INHALATION_SPRAY | RESPIRATORY_TRACT | Status: DC | PRN

## 2015-11-02 MED ORDER — PREDNISONE 20 MG PO TABS: 40.0000 mg | ORAL_TABLET | Freq: Every day | ORAL | Status: DC

## 2015-11-02 MED ORDER — LEVOFLOXACIN 500 MG PO TABS: 500.0000 mg | ORAL_TABLET | Freq: Every day | ORAL | Status: DC

## 2015-11-02 NOTE — ED Notes (Signed)
Pt walked around Pod A, pt was able to maintain oxygen saturation level above 94% without oxygen while ambulating.

## 2015-11-02 NOTE — Discharge Instructions (Signed)

## 2015-11-02 NOTE — [facility] Counselor (Signed)
Per nurse, [facility] consult is being removed.  Pt is not medically cleared yet.   Beryle FlockMary Habib Kise, MS, CRC, [facility] Triage Specialist [facility]

## 2015-11-07 LAB — CULTURE, BLOOD (ROUTINE X 2)
CULTURE: NO GROWTH
Culture: NO GROWTH

## 2015-11-17 ENCOUNTER — Ambulatory Visit: Payer: Commercial Managed Care - HMO

## 2015-11-17 ENCOUNTER — Ambulatory Visit (INDEPENDENT_AMBULATORY_CARE_PROVIDER_SITE_OTHER): Payer: Commercial Managed Care - HMO | Admitting: Family Medicine

## 2015-11-17 ENCOUNTER — Ambulatory Visit (INDEPENDENT_AMBULATORY_CARE_PROVIDER_SITE_OTHER): Payer: Commercial Managed Care - HMO

## 2015-11-17 VITALS — BP 134/84 | HR 89 | Temp 97.9°F | Resp 18 | Ht 66.5 in | Wt 122.0 lb

## 2015-11-17 DIAGNOSIS — M2550 Pain in unspecified joint: Secondary | ICD-10-CM

## 2015-11-17 DIAGNOSIS — J449 Chronic obstructive pulmonary disease, unspecified: Secondary | ICD-10-CM

## 2015-11-17 DIAGNOSIS — R251 Tremor, unspecified: Secondary | ICD-10-CM

## 2015-11-17 DIAGNOSIS — D529 Folate deficiency anemia, unspecified: Secondary | ICD-10-CM

## 2015-11-17 DIAGNOSIS — D520 Dietary folate deficiency anemia: Secondary | ICD-10-CM

## 2015-11-17 DIAGNOSIS — F10229 Alcohol dependence with intoxication, unspecified: Secondary | ICD-10-CM

## 2015-11-17 DIAGNOSIS — J189 Pneumonia, unspecified organism: Secondary | ICD-10-CM

## 2015-11-17 DIAGNOSIS — R531 Weakness: Secondary | ICD-10-CM

## 2015-11-17 DIAGNOSIS — R42 Dizziness and giddiness: Secondary | ICD-10-CM | POA: Diagnosis not present

## 2015-11-17 DIAGNOSIS — R0602 Shortness of breath: Secondary | ICD-10-CM | POA: Diagnosis not present

## 2015-11-17 LAB — POCT CBC
Granulocyte percent: 65.4 %G (ref 37–80)
HCT, POC: 35.2 % — AB (ref 43.5–53.7)
HEMOGLOBIN: 12.5 g/dL — AB (ref 14.1–18.1)
LYMPH, POC: 1.4 (ref 0.6–3.4)
MCH: 36.4 pg — AB (ref 27–31.2)
MCHC: 35.4 g/dL (ref 31.8–35.4)
MCV: 102.9 fL — AB (ref 80–97)
MID (cbc): 0.3 (ref 0–0.9)
MPV: 6.7 fL (ref 0–99.8)
PLATELET COUNT, POC: 211 10*3/uL (ref 142–424)
POC GRANULOCYTE: 3.3 (ref 2–6.9)
POC LYMPH %: 28.2 % (ref 10–50)
POC MID %: 6.4 %M (ref 0–12)
RBC: 3.42 M/uL — AB (ref 4.69–6.13)
RDW, POC: 15.6 %
WBC: 5.1 10*3/uL (ref 4.6–10.2)

## 2015-11-17 LAB — POCT SEDIMENTATION RATE: POCT SED RATE: 21 mm/h (ref 0–22)

## 2015-11-17 LAB — FOLATE: Folate: 9.8 ng/mL (ref >5.4)

## 2015-11-17 LAB — COMPREHENSIVE METABOLIC PANEL
ALBUMIN: 4.5 g/dL (ref 3.6–5.1)
ALK PHOS: 75 U/L (ref 40–115)
ALT: 23 U/L (ref 9–46)
AST: 43 U/L — AB (ref 10–35)
BILIRUBIN TOTAL: 0.6 mg/dL (ref 0.2–1.2)
BUN: 6 mg/dL — AB (ref 7–25)
CO2: 25 mmol/L (ref 20–31)
CREATININE: 0.46 mg/dL — AB (ref 0.70–1.33)
Calcium: 9.4 mg/dL (ref 8.6–10.3)
Chloride: 97 mmol/L — ABNORMAL LOW (ref 98–110)
Glucose, Bld: 71 mg/dL (ref 65–99)
Potassium: 3.5 mmol/L (ref 3.5–5.3)
SODIUM: 137 mmol/L (ref 135–146)
TOTAL PROTEIN: 7.7 g/dL (ref 6.1–8.1)

## 2015-11-17 LAB — HEPATITIS C ANTIBODY: HCV AB: NEGATIVE

## 2015-11-17 LAB — C-REACTIVE PROTEIN

## 2015-11-17 LAB — VITAMIN B12: VITAMIN B 12: 889 pg/mL (ref 200–1100)

## 2015-11-17 LAB — TSH: TSH: 1.73 mIU/L (ref 0.40–4.50)

## 2015-11-17 LAB — CK: Total CK: 290 U/L — ABNORMAL HIGH (ref 7–232)

## 2015-11-17 MED ORDER — VITAMIN B-1 100 MG PO TABS: 100.0000 mg | ORAL_TABLET | Freq: Every day | ORAL | Status: DC

## 2015-11-17 MED ORDER — AMOXICILLIN-POT CLAVULANATE 875-125 MG PO TABS: 1.0000 | ORAL_TABLET | Freq: Two times a day (BID) | ORAL | Status: DC

## 2015-11-17 MED ORDER — IPRATROPIUM BROMIDE 0.02 % IN SOLN
0.5000 mg | Freq: Once | RESPIRATORY_TRACT | Status: AC
  Administered 2015-11-17: 0.5 mg via RESPIRATORY_TRACT

## 2015-11-17 MED ORDER — PRENATAL PLUS 27-1 MG PO TABS: 1.0000 | ORAL_TABLET | Freq: Every day | ORAL | Status: DC

## 2015-11-17 MED ORDER — CYANOCOBALAMIN 1000 MCG/ML IJ SOLN
1000.0000 ug | Freq: Once | INTRAMUSCULAR | Status: AC
  Administered 2015-11-17: 1000 ug via INTRAMUSCULAR

## 2015-11-17 MED ORDER — PREDNISONE 20 MG PO TABS: 40.0000 mg | ORAL_TABLET | Freq: Every day | ORAL | Status: DC

## 2015-11-17 MED ORDER — DICLOFENAC SODIUM 1 % TD GEL: 2.0000 g | Freq: Four times a day (QID) | TRANSDERMAL | Status: DC

## 2015-11-17 MED ORDER — ALBUTEROL SULFATE (2.5 MG/3ML) 0.083% IN NEBU
2.5000 mg | INHALATION_SOLUTION | Freq: Once | RESPIRATORY_TRACT | Status: AC
  Administered 2015-11-17: 2.5 mg via RESPIRATORY_TRACT

## 2015-11-17 NOTE — Progress Notes (Addendum)
Subjective:  This chart was scribed for Danny Sorenson MD, by [name], at [facility].  This patient was seen in room 13 and the patient's care was started at 12:12 PM.   Chief Complaint  Patient presents with  . Arthritis    Bilateral arms, legs, feet  . Tremors  . Hernia  . Cough    Productive, Pt stated he had pneumonia  . Extremity Weakness     Patient ID: [name], male    DOB: 1966-08-01, 50 y.o.   MRN: [id number]  HPI HPI Comments: [name] is a 50 y.o. male who presents to the [facility] complaining of multiple symptoms today. Has an extensive history of alcohol abuse, COPD and a history of gout. He has had multiple drug screen done in the ER which have always been negative. All prior alcohol labs have been positive.  He has never had a normal blood alcohol. He has had a significantly elevated CK prior but renal function has been normal and liver function has been normal besides mildly elevated AST. No prior inflammatory markers or uric acid. He was last seen in the ER two weeks ago at which point he was diagnosed with Pneumonia.  He does have inhalers for his COPD at home.  He currently smokes 2 packs/day. He drinks 5-6, 40 ounce beers per day.  At that time, he did have a chest x ray which was concerning for pneumonia in right upper lobe and left lung base as well as some spaces in the right lower lobe. He did not have a leukocytosis.  Lactate was normal.  Patient was given a dose of Solumedrol and discharged with steroids and antibiotics. Prednisone 40 mg / day for 5 days and Levaquin 500 mg daily for 7 days.  ---- Patient states that he feels slightly better after using his antibiotics.  Patient states that he feels light headed and dizzy when he stands up.  He feels like his legs are very weak and states that his arthritis has been bothering him. Patient takes tylenol occasionally.    Patient Active Problem List   Diagnosis Date Noted  . COPD (chronic obstructive pulmonary disease) (HCC) 11/17/2015  . Alcohol dependence with uncomplicated withdrawal (HCC) 06/29/2014  . S/P alcohol detoxification 01/20/2014  . Pain of right thigh 01/20/2014  . Salicylate overdose 01/20/2014  . Alcohol abuse, continuous 09/24/2012    Class: Acute  . Alcohol dependence (HCC) 09/24/2012  . Alcohol withdrawal (HCC) 09/24/2012   Past Medical History  Diagnosis Date  . Hypertension   . Shortness of breath   . COPD (chronic obstructive pulmonary disease) (HCC)   . Depression   . Alcohol abuse   . Allergy   . Anxiety   . Arthritis    Past Surgical History  Procedure Laterality Date  . Hemorrhoid surgery    . Mandible fracture surgery    . Back surgery    . Hernia repair     No Known Allergies Prior to Admission medications   Medication Sig Start Date End Date Taking? Authorizing Provider  aspirin 325 MG tablet Take 325 mg by mouth daily.   Yes Historical Provider, MD  albuterol (PROVENTIL HFA;VENTOLIN HFA) 108 (90 Base) MCG/ACT inhaler Inhale 2 puffs into the lungs every 4 (four) hours as needed for wheezing or shortness of breath. Patient not taking: Reported on 11/17/2015 11/02/15   Shon Baton, MD  levofloxacin (LEVAQUIN) 500 MG  tablet Take 1 tablet (500 mg total) by mouth daily. Patient not taking: Reported on 11/17/2015 11/02/15   Shon Baton, MD  oxyCODONE-acetaminophen (PERCOCET/ROXICET) 5-325 MG per tablet Take 1-2 tablets by mouth every 6 (six) hours as needed for severe pain. Patient not taking: Reported on 08/25/2014 06/08/14   Jerelyn Scott, MD  predniSONE (DELTASONE) 20 MG tablet Take 2 tablets (40 mg total) by mouth daily with breakfast. Patient not taking: Reported on 11/17/2015 11/02/15   Shon Baton, MD   Social History   Social History  . Marital Status: Legally Separated    Spouse Name: N/A  . Number of Children: N/A  . Years of Education: N/A   Occupational History  . Not  on file.   Social History Main Topics  . Smoking status: Current Every Day Smoker -- 2.00 packs/day    Types: Cigarettes  . Smokeless tobacco: Never Used  . Alcohol Use: 6.0 oz/week    10 Cans of beer per week     Comment: 6- 40's daily   . Drug Use: Yes    Special: Cocaine, Marijuana  . Sexual Activity: Yes    Birth Control/ Protection: None   Other Topics Concern  . Not on file   Social History Narrative     Review of Systems  Constitutional: Positive for diaphoresis, activity change, appetite change and fatigue. Negative for fever and chills.  Respiratory: Positive for cough. Negative for choking and shortness of breath.   Cardiovascular: Negative for chest pain, palpitations and leg swelling.  Gastrointestinal: Negative for nausea, vomiting, abdominal pain, blood in stool, abdominal distention and anal bleeding.  Musculoskeletal: Positive for myalgias, back pain, arthralgias and gait problem. Negative for joint swelling, neck pain and neck stiffness.  Skin: Negative for color change and pallor.  Neurological: Positive for dizziness, tremors, weakness, light-headedness and headaches. Negative for seizures, syncope and facial asymmetry.  Psychiatric/Behavioral: Positive for behavioral problems, confusion, sleep disturbance, dysphoric mood and decreased concentration. The patient is nervous/anxious.        Objective:   Physical Exam  Constitutional: He appears listless. He appears cachectic. He is cooperative. He has a sickly appearance. No distress.  HENT:  Head: Normocephalic and atraumatic.  Eyes: No scleral icterus.  Cardiovascular: Normal rate, regular rhythm and normal heart sounds.  Exam reveals no gallop and no friction rub.   No murmur heard. Tachycardic but regular rhythm.   Pulmonary/Chest: Effort normal and breath sounds normal. No respiratory distress. He has no wheezes. He has no rales.  Good air movement.   Neurological: He appears listless. He displays  atrophy and tremor. GCS eye subscore is 4. GCS verbal subscore is 5. GCS motor subscore is 6.  Skin: Skin is warm and dry. He is not diaphoretic.  Psychiatric: His affect is blunt. His speech is delayed and slurred. He is slowed and withdrawn. Cognition and memory are impaired. He expresses inappropriate judgment.    Filed Vitals:   11/17/15 1056  BP: 134/84  Pulse: 89  Temp: 97.9 F (36.6 C)  TempSrc: Oral  Resp: 18  Height: 5' 6.5" (1.689 m)  Weight: 122 lb (55.339 kg)  SpO2: 94%   Orthostatic VS neg: BP stable though HR did increase with standing by 26 bpm.  Results for orders placed or performed in visit on 11/17/15  Comprehensive metabolic panel  Result Value Ref Range   Sodium 137 135 - 146 mmol/L   Potassium 3.5 3.5 - 5.3 mmol/L   Chloride 97 (L)  98 - 110 mmol/L   CO2 25 20 - 31 mmol/L   Glucose, Bld 71 65 - 99 mg/dL   BUN 6 (L) 7 - 25 mg/dL   Creat 4.54 (L) 0.98 - 1.33 mg/dL   Total Bilirubin 0.6 0.2 - 1.2 mg/dL   Alkaline Phosphatase 75 40 - 115 U/L   AST 43 (H) 10 - 35 U/L   ALT 23 9 - 46 U/L   Total Protein 7.7 6.1 - 8.1 g/dL   Albumin 4.5 3.6 - 5.1 g/dL   Calcium 9.4 8.6 - 11.9 mg/dL  C-reactive protein  Result Value Ref Range   CRP <0.5 <0.60 mg/dL  TSH  Result Value Ref Range   TSH 1.73 0.40 - 4.50 mIU/L  CK  Result Value Ref Range   Total CK 290 (H) 7 - 232 U/L  Vitamin B12  Result Value Ref Range   Vitamin B-12 889 200 - 1100 pg/mL  Folate  Result Value Ref Range   Folate 9.8 >5.4 ng/mL  HIV antibody  Result Value Ref Range   HIV 1&2 Ab, 4th Generation NONREACTIVE NONREACTIVE  Hepatitis C Antibody  Result Value Ref Range   HCV Ab NEGATIVE NEGATIVE  POCT CBC  Result Value Ref Range   WBC 5.1 4.6 - 10.2 K/uL   Lymph, poc 1.4 0.6 - 3.4   POC LYMPH PERCENT 28.2 10 - 50 %L   MID (cbc) 0.3 0 - 0.9   POC MID % 6.4 0 - 12 %M   POC Granulocyte 3.3 2 - 6.9   Granulocyte percent 65.4 37 - 80 %G   RBC 3.42 (A) 4.69 - 6.13 M/uL   Hemoglobin 12.5  (A) 14.1 - 18.1 g/dL   HCT, POC 14.7 (A) 82.9 - 53.7 %   MCV 102.9 (A) 80 - 97 fL   MCH, POC 36.4 (A) 27 - 31.2 pg   MCHC 35.4 31.8 - 35.4 g/dL   RDW, POC 56.2 %   Platelet Count, POC 211 142 - 424 K/uL   MPV 6.7 0 - 99.8 fL  POCT SEDIMENTATION RATE  Result Value Ref Range   POCT SED RATE 21 0 - 22 mm/hr   Dg Chest 2 View  11/17/2015  CLINICAL DATA:  Shortness of breath. EXAM: CHEST  2 VIEW COMPARISON:  No prior. FINDINGS: Right upper lobe and left lower lobe infiltrates noted consistent pneumonia. Follow-up chest x-rays recommended demonstrate clearing. Heart size normal. No pleural effusion or pneumothorax. No acute bony abnormality P IMPRESSION: Right upper lobe and left lower lobe pulmonary infiltrates consistent with pneumonia. Electronically Signed   By: Maisie Fus  Register   On: 11/17/2015 12:42   Dg Chest 2 View  11/01/2015  CLINICAL DATA:  Shortness of breath for several weeks. History of COPD and asthma. History of chest pain with coughing. EXAM: CHEST  2 VIEW COMPARISON:  Chest x-rays dated 09/16/2015 06/29/2014. FINDINGS: New ill-defined airspace opacities are present within the right upper lobe and at the left lung base, consistent with pneumonia. There are additional subtle patchy airspace opacities within the right lower lobe, also suggesting pneumonia. Lungs are hyperexpanded compatible with the given history of COPD. No pleural effusion. No pneumothorax seen. Cardiomediastinal silhouette is stable in size and configuration. IMPRESSION: Bilateral pneumonia. COPD. Electronically Signed   By: Bary Richard M.D.   On: 11/01/2015 21:47       Assessment & Plan:   1. Chronic obstructive pulmonary disease, unspecified COPD type (HCC)   2. Alcohol dependence  with intoxication with complication (HCC)   3. Bilateral pneumonia   4. Arthralgia   5. Tremor   6. Weak   7. Lightheaded   8. Anemia, macrocytic, nutritional   New pt - almost all of hx was provided by pt's sister  Given  1000mg  IM vit B12 x 1 - start pnv (for folate) and thiamine Pt's sister asks about pain medicine for arthritis - states ER dr told them to ask their PCP for oxycodone for this. . . .  Reviewed that narcotics are not an option to treat osteoarthritis pain. Unfortunately, would also rec pt stay away from nsaids due to bleeding risk from alcoholic gastritis and avoid acetaminophen due to liver overload in large volume chronic alcoholic.  He may receive best and safest relief through topical - can try otc lidocaine patch, aspercreme, tiger balm, capsaicin, or diclofenac gel. Pt does not have PCP and was hoping to est here but advised not able to accept new pts at this time though welcome to come to [facility] walk-in for any needs.  Sig concern that CXR still shows multilobular pneumonia with no sig improvement since was originally diagnosed 2 wks ago in the ER and completed course of levaquin.  Pt with COPD but also risk of anaerobes and aspiration due to heavy alcohol use so will cover with augmentin.  Importance of recheck in 2 wks to ensure pna has cleared with f/u CXR reinforced. RTC immed if sxs worsen.  If no resolution on CXR at f/u - would proceed with CT.  Orders Placed This Encounter  Procedures  . DG Chest 2 View    Standing Status: Future     Number of Occurrences: 1     Standing Expiration Date: 11/16/2016    Order Specific Question:  Reason for Exam (SYMPTOM  OR DIAGNOSIS REQUIRED)    Answer:  multilobular pneumonia 2 wks prior s/p levaquin    Order Specific Question:  Preferred imaging location?    Answer:  External  . Comprehensive metabolic panel  . C-reactive protein  . TSH  . CK  . Vitamin B12  . Folate  . HIV antibody  . Hepatitis C Antibody  . Orthostatic vital signs  . POCT CBC  . POCT SEDIMENTATION RATE    Meds ordered this encounter  Medications  . aspirin 325 MG tablet    Sig: Take 325 mg by mouth daily.  Marland Kitchen albuterol (PROVENTIL) (2.5 MG/3ML) 0.083% nebulizer solution 2.5  mg    Sig:   . ipratropium (ATROVENT) nebulizer solution 0.5 mg    Sig:   . cyanocobalamin ((VITAMIN B-12)) injection 1,000 mcg    Sig:   . DISCONTD: amoxicillin-clavulanate (AUGMENTIN) 875-125 MG tablet    Sig: Take 1 tablet by mouth 2 (two) times daily.    Dispense:  20 tablet    Refill:  0  . DISCONTD: diclofenac sodium (VOLTAREN) 1 % GEL    Sig: Apply 2 g topically 4 (four) times daily.    Dispense:  100 g    Refill:  0  . DISCONTD: predniSONE (DELTASONE) 20 MG tablet    Sig: Take 2 tablets (40 mg total) by mouth daily with breakfast.    Dispense:  10 tablet    Refill:  0  . DISCONTD: thiamine (VITAMIN B-1) 100 MG tablet    Sig: Take 1 tablet (100 mg total) by mouth daily.    Dispense:  90 tablet    Refill:  3  . DISCONTD: prenatal vitamin  w/FE, FA (PRENATAL 1 + 1) 27-1 MG TABS tablet    Sig: Take 1 tablet by mouth daily.    Dispense:  90 each    Refill:  3  . amoxicillin-clavulanate (AUGMENTIN) 875-125 MG tablet    Sig: Take 1 tablet by mouth 2 (two) times daily.    Dispense:  20 tablet    Refill:  0  . diclofenac sodium (VOLTAREN) 1 % GEL    Sig: Apply 2 g topically 4 (four) times daily.    Dispense:  100 g    Refill:  0  . predniSONE (DELTASONE) 20 MG tablet    Sig: Take 2 tablets (40 mg total) by mouth daily with breakfast.    Dispense:  10 tablet    Refill:  0  . prenatal vitamin w/FE, FA (PRENATAL 1 + 1) 27-1 MG TABS tablet    Sig: Take 1 tablet by mouth daily.    Dispense:  90 each    Refill:  3  . thiamine (VITAMIN B-1) 100 MG tablet    Sig: Take 1 tablet (100 mg total) by mouth daily.    Dispense:  90 tablet    Refill:  3    I personally performed the services described in this documentation, which was scribed in my presence. The recorded information has been reviewed and considered, and addended by me as needed.  Danny SorensonEva Haruka Kowaleski, MD MPH

## 2015-11-17 NOTE — Patient Instructions (Addendum)
   IF you received an x-ray today, you will receive an invoice from [facility]. Please contact [facility] at [phone] with questions or concerns regarding your invoice.   IF you received labwork today, you will receive an invoice from [facility]. Please contact [facility] at [phone] with questions or concerns regarding your invoice.   Our billing staff will not be able to assist you with questions regarding bills from these companies.  You will be contacted with the lab results as soon as they are available. The fastest way to get your results is to activate your My Chart account. Instructions are located on the last page of this paperwork. If you have not heard from us regarding the results in 2 weeks, please contact this office.      Community-Acquired Pneumonia, Adult Pneumonia is an infection of the lungs. There are different types of pneumonia. One type can develop while a person is in a hospital. A different type, called community-acquired pneumonia, develops in people who are not, or have not recently been, in the hospital or other health care facility.  CAUSES Pneumonia may be caused by bacteria, viruses, or funguses. Community-acquired pneumonia is often caused by Streptococcus pneumonia bacteria. These bacteria are often passed from one person to another by breathing in droplets from the cough or sneeze of an infected person. RISK FACTORS The condition is more likely to develop in:  People who havechronic diseases, such as chronic obstructive pulmonary disease (COPD), asthma, congestive heart failure, cystic fibrosis, diabetes, or kidney disease.  People who haveearly-stage or late-stage HIV.  People who havesickle cell disease.  People who havehad their spleen removed (splenectomy).  People who havepoor dental hygiene.  People who havemedical conditions that increase the risk of breathing in (aspirating)  secretions their own mouth and nose.   People who havea weakened immune system (immunocompromised).  People who smoke.  People whotravel to areas where pneumonia-causing germs commonly exist.  People whoare around animal habitats or animals that have pneumonia-causing germs, including birds, bats, rabbits, cats, and farm animals. SYMPTOMS Symptoms of this condition include:  Adry cough.  A wet (productive) cough.  Fever.  Sweating.  Chest pain, especially when breathing deeply or coughing.  Rapid breathing or difficulty breathing.  Shortness of breath.  Shaking chills.  Fatigue.  Muscle aches. DIAGNOSIS Your health care provider will take a medical history and perform a physical exam. You may also have other tests, including:  Imaging studies of your chest, including X-rays.  Tests to check your blood oxygen level and other blood gases.  Other tests on blood, mucus (sputum), fluid around your lungs (pleural fluid), and urine. If your pneumonia is severe, other tests may be done to identify the specific cause of your illness. TREATMENT The type of treatment that you receive depends on many factors, such as the cause of your pneumonia, the medicines you take, and other medical conditions that you have. For most adults, treatment and recovery from pneumonia may occur at home. In some cases, treatment must happen in a hospital. Treatment may include:  Antibiotic medicines, if the pneumonia was caused by bacteria.  Antiviral medicines, if the pneumonia was caused by a virus.  Medicines that are given by mouth or through an IV tube.  Oxygen.  Respiratory therapy. Although rare, treating severe pneumonia may include:  Mechanical ventilation. This is done if you are not breathing well on your own and you cannot maintain a safe blood oxygen level.    procedureremoves fluid around one lung or both lungs to help you breathe better. HOME CARE  INSTRUCTIONS 1. Take over-the-counter and prescription medicines only as told by your health care provider. 1. Only takecough medicine if you are losing sleep. Understand that cough medicine can prevent your body's natural ability to remove mucus from your lungs. 2. If you were prescribed an antibiotic medicine, take it as told by your health care provider. Do not stop taking the antibiotic even if you start to feel better. 2. Sleep in a semi-upright position at night. Try sleeping in a reclining chair, or place a few pillows under your head. 3. Do not use tobacco products, including cigarettes, chewing tobacco, and e-cigarettes. If you need help quitting, ask your health care provider. 4. Drink enough water to keep your urine clear or pale yellow. This will help to thin out mucus secretions in your lungs. PREVENTION There are ways that you can decrease your risk of developing community-acquired pneumonia. Consider getting a pneumococcal vaccine if:  You are older than 51 years of age.  You are older than 51 years of age and are undergoing cancer treatment, have chronic lung disease, or have other medical conditions that affect your immune system. Ask your health care provider if this applies to you. There are different types and schedules of pneumococcal vaccines. Ask your health care provider which vaccination option is best for you. You may also prevent community-acquired pneumonia if you take these actions:  Get an influenza vaccine every year. Ask your health care provider which type of influenza vaccine is best for you.  Go to the dentist on a regular basis.  Wash your hands often. Use hand sanitizer if soap and water are not available. SEEK MEDICAL CARE IF:  You have a fever.  You are losing sleep because you cannot control your cough with cough medicine. SEEK IMMEDIATE MEDICAL CARE IF:  You have worsening shortness of breath.  You have increased chest pain.  Your sickness  becomes worse, especially if you are an older adult or have a weakened immune system.  You cough up blood.   This information is not intended to replace advice given to you by your health care provider. Make sure you discuss any questions you have with your health care provider.   Document Released: 08/07/2005 Document Revised: 04/28/2015 Document Reviewed: 12/02/2014 Elsevier Interactive Patient Education 2016 Elsevier Inc. Chronic Obstructive Pulmonary Disease Chronic obstructive pulmonary disease (COPD) is a common lung condition in which airflow from the lungs is limited. COPD is a general term that can be used to describe many different lung problems that limit airflow, including both chronic bronchitis and emphysema. If you have COPD, your lung function will probably never return to normal, but there are measures you can take to improve lung function and make yourself feel better. CAUSES   Smoking (common).  Exposure to secondhand smoke.  Genetic problems.  Chronic inflammatory lung diseases or recurrent infections. SYMPTOMS  Shortness of breath, especially with physical activity.  Deep, persistent (chronic) cough with a large amount of thick mucus.  Wheezing.  Rapid breaths (tachypnea).  Gray or bluish discoloration (cyanosis) of the skin, especially in your fingers, toes, or lips.  Fatigue.  Weight loss.  Frequent infections or episodes when breathing symptoms become much worse (exacerbations).  Chest tightness. DIAGNOSIS Your health care provider will take a medical history and perform a physical examination to diagnose COPD. Additional tests for COPD may include:  Lung (pulmonary) function tests.  Chest X-ray.  CT scan.  Blood tests. TREATMENT  Treatment for COPD may include:  Inhaler and nebulizer medicines. These help manage the symptoms of COPD and make your breathing more comfortable.  Supplemental oxygen. Supplemental oxygen is only helpful if you  have a low oxygen level in your blood.  Exercise and physical activity. These are beneficial for nearly all people with COPD.  Lung surgery or transplant.  Nutrition therapy to gain weight, if you are underweight.  Pulmonary rehabilitation. This may involve working with a team of health care providers and specialists, such as respiratory, occupational, and physical therapists. HOME CARE INSTRUCTIONS  Take all medicines (inhaled or pills) as directed by your health care provider.  Avoid over-the-counter medicines or cough syrups that dry up your airway (such as antihistamines) and slow down the elimination of secretions unless instructed otherwise by your health care provider.  If you are a smoker, the most important thing that you can do is stop smoking. Continuing to smoke will cause further lung damage and breathing trouble. Ask your health care provider for help with quitting smoking. He or she can direct you to community resources or hospitals that provide support.  Avoid exposure to irritants such as smoke, chemicals, and fumes that aggravate your breathing.  Use oxygen therapy and pulmonary rehabilitation if directed by your health care provider. If you require home oxygen therapy, ask your health care provider whether you should purchase a pulse oximeter to measure your oxygen level at home.  Avoid contact with individuals who have a contagious illness.  Avoid extreme temperature and humidity changes.  Eat healthy foods. Eating smaller, more frequent meals and resting before meals may help you maintain your strength.  Stay active, but balance activity with periods of rest. Exercise and physical activity will help you maintain your ability to do things you want to do.  Preventing infection and hospitalization is very important when you have COPD. Make sure to receive all the vaccines your health care provider recommends, especially the pneumococcal and influenza vaccines. Ask your  health care provider whether you need a pneumonia vaccine.  Learn and use relaxation techniques to manage stress.  Learn and use controlled breathing techniques as directed by your health care provider. Controlled breathing techniques include:  Pursed lip breathing. Start by breathing in (inhaling) through your nose for 1 second. Then, purse your lips as if you were going to whistle and breathe out (exhale) through the pursed lips for 2 seconds.  Diaphragmatic breathing. Start by putting one hand on your abdomen just above your waist. Inhale slowly through your nose. The hand on your abdomen should move out. Then purse your lips and exhale slowly. You should be able to feel the hand on your abdomen moving in as you exhale.  Learn and use controlled coughing to clear mucus from your lungs. Controlled coughing is a series of short, progressive coughs. The steps of controlled coughing are: 5. Lean your head slightly forward. 6. Breathe in deeply using diaphragmatic breathing. 7. Try to hold your breath for 3 seconds. 8. Keep your mouth slightly open while coughing twice. 9. Spit any mucus out into a tissue. 10. Rest and repeat the steps once or twice as needed. SEEK MEDICAL CARE IF:  You are coughing up more mucus than usual.  There is a change in the color or thickness of your mucus.  Your breathing is more labored than usual.  Your breathing is faster than usual. SEEK IMMEDIATE MEDICAL CARE IF:  You have shortness of breath while you are resting.  You have shortness of breath that prevents you from:  Being able to talk.  Performing your usual physical activities.  You have chest pain lasting longer than 5 minutes.  Your skin color is more cyanotic than usual.  You measure low oxygen saturations for longer than 5 minutes with a pulse oximeter. MAKE SURE YOU:  Understand these instructions.  Will watch your condition.  Will get help right away if you are not doing well or  get worse.   This information is not intended to replace advice given to you by your health care provider. Make sure you discuss any questions you have with your health care provider.   Document Released: 05/17/2005 Document Revised: 08/28/2014 Document Reviewed: 04/03/2013 Elsevier Interactive Patient Education [employer]2016 Elsevier Inc.

## 2015-11-18 LAB — HIV ANTIBODY (ROUTINE TESTING W REFLEX): HIV 1&2 Ab, 4th Generation: NONREACTIVE

## 2015-11-26 ENCOUNTER — Encounter: Payer: Self-pay | Admitting: Family Medicine

## 2015-11-28 ENCOUNTER — Emergency Department (HOSPITAL_COMMUNITY): Payer: Medicare HMO

## 2015-11-28 ENCOUNTER — Emergency Department (HOSPITAL_COMMUNITY)
Admission: EM | Admit: 2015-11-28 | Discharge: 2015-11-28 | Disposition: A | Discharge status: Home / Self Care | Payer: Medicare HMO | Attending: Emergency Medicine | Admitting: Emergency Medicine

## 2015-11-28 ENCOUNTER — Encounter (HOSPITAL_COMMUNITY): Payer: Self-pay | Admitting: *Deleted

## 2015-11-28 DIAGNOSIS — Y900 Blood alcohol level of less than 20 mg/100 ml: Secondary | ICD-10-CM | POA: Diagnosis not present

## 2015-11-28 DIAGNOSIS — K2920 Alcoholic gastritis without bleeding: Secondary | ICD-10-CM

## 2015-11-28 DIAGNOSIS — J449 Chronic obstructive pulmonary disease, unspecified: Secondary | ICD-10-CM | POA: Insufficient documentation

## 2015-11-28 DIAGNOSIS — R404 Transient alteration of awareness: Secondary | ICD-10-CM | POA: Diagnosis not present

## 2015-11-28 DIAGNOSIS — F10230 Alcohol dependence with withdrawal, uncomplicated: Secondary | ICD-10-CM | POA: Insufficient documentation

## 2015-11-28 DIAGNOSIS — M1990 Unspecified osteoarthritis, unspecified site: Secondary | ICD-10-CM | POA: Diagnosis not present

## 2015-11-28 DIAGNOSIS — F329 Major depressive disorder, single episode, unspecified: Secondary | ICD-10-CM | POA: Diagnosis not present

## 2015-11-28 DIAGNOSIS — F17210 Nicotine dependence, cigarettes, uncomplicated: Secondary | ICD-10-CM | POA: Diagnosis not present

## 2015-11-28 DIAGNOSIS — R630 Anorexia: Secondary | ICD-10-CM | POA: Insufficient documentation

## 2015-11-28 DIAGNOSIS — Z79899 Other long term (current) drug therapy: Secondary | ICD-10-CM | POA: Insufficient documentation

## 2015-11-28 DIAGNOSIS — Z7952 Long term (current) use of systemic steroids: Secondary | ICD-10-CM | POA: Insufficient documentation

## 2015-11-28 DIAGNOSIS — R112 Nausea with vomiting, unspecified: Secondary | ICD-10-CM

## 2015-11-28 DIAGNOSIS — I10 Essential (primary) hypertension: Secondary | ICD-10-CM | POA: Diagnosis not present

## 2015-11-28 DIAGNOSIS — R531 Weakness: Secondary | ICD-10-CM | POA: Diagnosis not present

## 2015-11-28 LAB — CBC WITH DIFFERENTIAL/PLATELET
BASOS ABS: 0 10*3/uL (ref 0.0–0.1)
Basophils Relative: 0 %
Eosinophils Absolute: 0 10*3/uL (ref 0.0–0.7)
Eosinophils Relative: 0 %
HEMATOCRIT: 32 % — AB (ref 39.0–52.0)
HEMOGLOBIN: 11 g/dL — AB (ref 13.0–17.0)
LYMPHS PCT: 11 %
Lymphs Abs: 0.6 10*3/uL — ABNORMAL LOW (ref 0.7–4.0)
MCH: 34.5 pg — ABNORMAL HIGH (ref 26.0–34.0)
MCHC: 34.4 g/dL (ref 30.0–36.0)
MCV: 100.3 fL — AB (ref 78.0–100.0)
MONO ABS: 0.5 10*3/uL (ref 0.1–1.0)
MONOS PCT: 8 %
NEUTROS ABS: 4.6 10*3/uL (ref 1.7–7.7)
NEUTROS PCT: 81 %
Platelets: 156 10*3/uL (ref 150–400)
RBC: 3.19 MIL/uL — ABNORMAL LOW (ref 4.22–5.81)
RDW: 15.4 % (ref 11.5–15.5)
WBC: 5.7 10*3/uL (ref 4.0–10.5)

## 2015-11-28 LAB — COMPREHENSIVE METABOLIC PANEL
ALK PHOS: 54 U/L (ref 38–126)
ALT: 26 U/L (ref 17–63)
AST: 53 U/L — AB (ref 15–41)
Albumin: 4.1 g/dL (ref 3.5–5.0)
Anion gap: 14 (ref 5–15)
BILIRUBIN TOTAL: 0.6 mg/dL (ref 0.3–1.2)
BUN: 10 mg/dL (ref 6–20)
CHLORIDE: 98 mmol/L — AB (ref 101–111)
CO2: 27 mmol/L (ref 22–32)
CREATININE: 0.58 mg/dL — AB (ref 0.61–1.24)
Calcium: 9.5 mg/dL (ref 8.9–10.3)
GFR calc non Af Amer: >60 mL/min (ref >60)
Glucose, Bld: 98 mg/dL (ref 65–99)
Potassium: 2.7 mmol/L — CL (ref 3.5–5.1)
Sodium: 139 mmol/L (ref 135–145)
Total Protein: 7.4 g/dL (ref 6.5–8.1)

## 2015-11-28 LAB — RAPID URINE DRUG SCREEN, HOSP PERFORMED
AMPHETAMINES: NOT DETECTED
BENZODIAZEPINES: NOT DETECTED
Barbiturates: NOT DETECTED
Cocaine: NOT DETECTED
Opiates: NOT DETECTED
Tetrahydrocannabinol: NOT DETECTED

## 2015-11-28 LAB — ETHANOL

## 2015-11-28 LAB — LIPASE, BLOOD: Lipase: 26 U/L (ref 11–51)

## 2015-11-28 MED ORDER — ADULT MULTIVITAMIN W/MINERALS CH
1.0000 | ORAL_TABLET | Freq: Every day | ORAL | Status: DC
  Administered 2015-11-28: 1 via ORAL
  Filled 2015-11-28: qty 1

## 2015-11-28 MED ORDER — ONDANSETRON 4 MG PO TBDP
4.0000 mg | ORAL_TABLET | Freq: Once | ORAL | Status: AC
  Administered 2015-11-28: 4 mg via ORAL
  Filled 2015-11-28: qty 1

## 2015-11-28 MED ORDER — VITAMIN B-1 100 MG PO TABS
100.0000 mg | ORAL_TABLET | Freq: Once | ORAL | Status: AC
  Administered 2015-11-28: 100 mg via ORAL
  Filled 2015-11-28: qty 1

## 2015-11-28 MED ORDER — SODIUM CHLORIDE 0.9 % IV BOLUS (SEPSIS)
1000.0000 mL | Freq: Once | INTRAVENOUS | Status: AC
  Administered 2015-11-28: 1000 mL via INTRAVENOUS

## 2015-11-28 MED ORDER — ONDANSETRON HCL 4 MG PO TABS: 4.0000 mg | ORAL_TABLET | Freq: Three times a day (TID) | ORAL | Status: DC | PRN

## 2015-11-28 MED ORDER — POTASSIUM CHLORIDE 10 MEQ/100ML IV SOLN
10.0000 meq | INTRAVENOUS | Status: AC
  Administered 2015-11-28 (×3): 10 meq via INTRAVENOUS
  Filled 2015-11-28 (×3): qty 100

## 2015-11-28 MED ORDER — ONDANSETRON HCL 4 MG/2ML IJ SOLN
4.0000 mg | Freq: Once | INTRAMUSCULAR | Status: AC
  Administered 2015-11-28: 4 mg via INTRAVENOUS
  Filled 2015-11-28: qty 2

## 2015-11-28 NOTE — ED Notes (Signed)
MD at bedside. 

## 2015-11-28 NOTE — ED Notes (Signed)
1st bag of Potassium started at 1538 stopped.

## 2015-11-28 NOTE — ED Notes (Signed)
Patient transported to X-ray 

## 2015-11-28 NOTE — ED Notes (Signed)
Critical Lab:  2.7 potassium MD made aware.

## 2015-11-28 NOTE — ED Notes (Addendum)
Per EMS- pt reports N/V/D that started over 1 week ago. Pt noted to have tremors which he reports occurs often. Pt has hx of alcohol abuse and reports drinking daily. Pt was also treated for pneumonia but did not complete antibiotics. Received 4mg  of zofran en route

## 2015-11-28 NOTE — ED Provider Notes (Signed)
CSN: [id number]     Arrival date & time 11/28/15  1342 History   First MD Initiated Contact with Patient 11/28/15 1346     Chief Complaint  Patient presents with  . Nausea  . Emesis  . Diarrhea     (Consider location/radiation/quality/duration/timing/severity/associated sxs/prior Treatment) HPI 50 year old male with a history of chronic EtOH abuse with daily drinks of approximately 4 40 ounce beverages presents to the emergency department stating that he had a history of pneumonia about one month back that he was prescribed antibiotics for. He states that he was not compliant with all but states that he did take most of his prescribed antibiotics. He states that his reductive cough that he had not time has since gradually improved that he does admit to continued waxing and waning nonproductive cough. He states that around the time that he started his prescribed antibiotics he began having intermittent watery diarrhea with no blood. His diarrhea sx are improving and now intermittent. He states that over the last week or so he has had intermittent nausea and nonbloody vomiting as well, particularly in the mornings after he had been drinking heavily. He states that his last drink was earlier this AM. He denies any hx of prior DTs or seizures, but does get tremulous when he has not been drinking.    Past Medical History  Diagnosis Date  . Hypertension   . Shortness of breath   . COPD (chronic obstructive pulmonary disease) (HCC)   . Depression   . Alcohol abuse   . Allergy   . Anxiety   . Arthritis    Past Surgical History  Procedure Laterality Date  . Hemorrhoid surgery    . Mandible fracture surgery    . Back surgery    . Hernia repair     Family History  Problem Relation Age of Onset  . Hypertension Mother   . Hypertension Father   . Hypertension Brother    Social History  Substance Use Topics  . Smoking status: Current Every Day Smoker -- 2.00 packs/day    Types: Cigarettes   . Smokeless tobacco: Never Used  . Alcohol Use: 6.0 oz/week    10 Cans of beer per week     Comment: 6- 40's daily     Review of Systems  Constitutional: Positive for activity change and appetite change. Negative for fever and chills.  HENT: Negative for congestion, rhinorrhea, sinus pressure, sneezing and sore throat.   Respiratory: Positive for cough. Negative for chest tightness, shortness of breath and wheezing.   Cardiovascular: Negative for chest pain.  Gastrointestinal: Positive for nausea, vomiting and diarrhea. Negative for abdominal pain.  Genitourinary: Negative for hematuria, flank pain and difficulty urinating.  Skin: Negative for rash and wound.  Neurological: Negative for dizziness, seizures, syncope and numbness.  All other systems reviewed and are negative.     Allergies  Review of patient's allergies indicates no known allergies.  Home Medications   Prior to Admission medications   Medication Sig Start Date End Date Taking? Authorizing Provider  albuterol (PROVENTIL HFA;VENTOLIN HFA) 108 (90 Base) MCG/ACT inhaler Inhale 2 puffs into the lungs every 4 (four) hours as needed for wheezing or shortness of breath. Patient not taking: Reported on 11/17/2015 11/02/15   Shon Batonourtney F Horton, MD  amoxicillin-clavulanate (AUGMENTIN) 875-125 MG tablet Take 1 tablet by mouth 2 (two) times daily. 11/17/15   Sherren MochaEva N Shaw, MD  aspirin 325 MG tablet Take 325 mg by mouth daily.  Historical Provider, MD  diclofenac sodium (VOLTAREN) 1 % GEL Apply 2 g topically 4 (four) times daily. 11/17/15   Sherren Mocha, MD  ondansetron (ZOFRAN) 4 MG tablet Take 1 tablet (4 mg total) by mouth every 8 (eight) hours as needed for nausea or vomiting. 11/28/15   Francoise Ceo, DO  predniSONE (DELTASONE) 20 MG tablet Take 2 tablets (40 mg total) by mouth daily with breakfast. 11/17/15   Sherren Mocha, MD  prenatal vitamin w/FE, FA (PRENATAL 1 + 1) 27-1 MG TABS tablet Take 1 tablet by mouth daily. 11/17/15   Sherren Mocha, MD  thiamine (VITAMIN B-1) 100 MG tablet Take 1 tablet (100 mg total) by mouth daily. 11/17/15   Sherren Mocha, MD   BP 142/94 mmHg  Pulse 82  Temp(Src) 98.6 F (37 C) (Oral)  Resp 17  SpO2 97% Physical Exam  Constitutional: He is oriented to person, place, and time. He appears well-developed and well-nourished. No distress.  HENT:  Head: Normocephalic and atraumatic.  Nose: Nose normal.  Mouth/Throat: Oropharynx is clear and moist.  Eyes: Conjunctivae and EOM are normal. Pupils are equal, round, and reactive to light.  Neck: Normal range of motion. Neck supple.  Cardiovascular: Normal rate, regular rhythm, normal heart sounds and intact distal pulses.   Pulmonary/Chest: Effort normal. He has rales (left basilar).  Abdominal: Soft. He exhibits no distension. There is no tenderness. There is no guarding.  Musculoskeletal: He exhibits no edema or tenderness.  Neurological: He is alert and oriented to person, place, and time. No cranial nerve deficit. Coordination normal.  Skin: Skin is warm and dry. No rash noted. He is not diaphoretic.  Nursing note and vitals reviewed.   ED Course  Procedures (including critical care time) Labs Review Labs Reviewed  CBC WITH DIFFERENTIAL/PLATELET - Abnormal; Notable for the following:    RBC 3.19 (*)    Hemoglobin 11.0 (*)    HCT 32.0 (*)    MCV 100.3 (*)    MCH 34.5 (*)    Lymphs Abs 0.6 (*)    All other components within normal limits  COMPREHENSIVE METABOLIC PANEL - Abnormal; Notable for the following:    Potassium 2.7 (*)    Chloride 98 (*)    Creatinine, Ser 0.58 (*)    AST 53 (*)    All other components within normal limits  LIPASE, BLOOD  ETHANOL  URINE RAPID DRUG SCREEN, HOSP PERFORMED    Imaging Review Dg Abd Acute W/chest  11/28/2015  CLINICAL DATA:  Nausea and vomiting for several weeks EXAM: DG ABDOMEN ACUTE W/ 1V CHEST COMPARISON:  11/17/2015 FINDINGS: Cardiac shadow is stable. The lungs are again hyperinflated.  Improving infiltrate in the right upper lobe is seen. Stable changes in the left base are noted likely chronic in nature. No effusion is seen. The abdomen shows a nonobstructive bowel gas pattern. No abnormal mass or abnormal calcifications are noted. Degenerative changes of lumbar spine with a scoliosis concave to the left are seen. No free air is noted. IMPRESSION: Improved aeration in the right upper lobe. Chronic changes in the left base are again seen. No acute abnormality is noted in the abdomen. Electronically Signed   By: Alcide Clever M.D.   On: 11/28/2015 14:37   I have personally reviewed and evaluated these images and lab results as part of my medical decision-making.   EKG Interpretation None      MDM  50 y.o. male with a hx of  chronic ETOH abuse presents to the ED noting intermittent nausea and vomiting, particularly after nights of heavy drinking accompanied by intermittent frontal headaches and fatigue suggestive of ETOH gastritis sx of hangover. His sx were controlled with zofran and he was able to tolerate PO in the ED. Labs were drawn and showed evidence of likely beer potomania with hypokalemia of 2.7. CXR was done and showed improved aeration in the right upper lobe and noted chronic changes in the left base. VSS, AF, with reassuring exam. He was given IV potassium and was given a multivitamin and thiamine in the ED. Feel that his diarrhea sx were likely side effect of his recent abx, reassured that it is improving. Low suspicion for c.dif. Given his usual AM onset of gastritis sx feel that they are associated with hangover from chronic ETOH abuse. He was recommended to take either a probiotic or eat yogurt for his GI tract and was strongly encouraged to eat more frequently, as opposed to just drinking, as he admits to doing regularly. He was recommended to establish care with a PCP to further discuss his sx and was strongly encouraged to seek addition help with AA or similar community  programs for substance abuse.     Final diagnoses:  Alcohol dependence with uncomplicated withdrawal (HCC)  Nausea and vomiting in adult  Acute alcoholic gastritis without hemorrhage      Francoise Ceo, DO 11/29/15 0132  Gwyneth Sprout, MD 12/01/15 2148

## 2016-02-05 ENCOUNTER — Emergency Department (HOSPITAL_COMMUNITY)
Admission: EM | Admit: 2016-02-05 | Discharge: 2016-02-06 | Disposition: A | Discharge status: Home / Self Care | Payer: Commercial Managed Care - HMO | Attending: Emergency Medicine | Admitting: Emergency Medicine

## 2016-02-05 ENCOUNTER — Other Ambulatory Visit: Payer: Self-pay

## 2016-02-05 ENCOUNTER — Emergency Department (HOSPITAL_COMMUNITY): Payer: Commercial Managed Care - HMO

## 2016-02-05 ENCOUNTER — Encounter (HOSPITAL_COMMUNITY): Payer: Self-pay | Admitting: Emergency Medicine

## 2016-02-05 DIAGNOSIS — R0602 Shortness of breath: Secondary | ICD-10-CM | POA: Diagnosis present

## 2016-02-05 DIAGNOSIS — Y908 Blood alcohol level of 240 mg/100 ml or more: Secondary | ICD-10-CM | POA: Insufficient documentation

## 2016-02-05 DIAGNOSIS — F10120 Alcohol abuse with intoxication, uncomplicated: Secondary | ICD-10-CM | POA: Insufficient documentation

## 2016-02-05 DIAGNOSIS — R05 Cough: Secondary | ICD-10-CM | POA: Diagnosis not present

## 2016-02-05 DIAGNOSIS — I10 Essential (primary) hypertension: Secondary | ICD-10-CM | POA: Diagnosis not present

## 2016-02-05 DIAGNOSIS — Z7982 Long term (current) use of aspirin: Secondary | ICD-10-CM | POA: Insufficient documentation

## 2016-02-05 DIAGNOSIS — F10920 Alcohol use, unspecified with intoxication, uncomplicated: Secondary | ICD-10-CM

## 2016-02-05 DIAGNOSIS — J439 Emphysema, unspecified: Secondary | ICD-10-CM | POA: Diagnosis not present

## 2016-02-05 DIAGNOSIS — F10220 Alcohol dependence with intoxication, uncomplicated: Secondary | ICD-10-CM | POA: Diagnosis not present

## 2016-02-05 DIAGNOSIS — F17210 Nicotine dependence, cigarettes, uncomplicated: Secondary | ICD-10-CM | POA: Insufficient documentation

## 2016-02-05 DIAGNOSIS — J449 Chronic obstructive pulmonary disease, unspecified: Secondary | ICD-10-CM

## 2016-02-05 DIAGNOSIS — R079 Chest pain, unspecified: Secondary | ICD-10-CM | POA: Diagnosis not present

## 2016-02-05 DIAGNOSIS — R0789 Other chest pain: Secondary | ICD-10-CM | POA: Insufficient documentation

## 2016-02-05 DIAGNOSIS — Z79899 Other long term (current) drug therapy: Secondary | ICD-10-CM | POA: Diagnosis not present

## 2016-02-05 LAB — CBC
HEMATOCRIT: 34.4 % — AB (ref 39.0–52.0)
HEMOGLOBIN: 12 g/dL — AB (ref 13.0–17.0)
MCH: 33.9 pg (ref 26.0–34.0)
MCHC: 34.9 g/dL (ref 30.0–36.0)
MCV: 97.2 fL (ref 78.0–100.0)
Platelets: 132 10*3/uL — ABNORMAL LOW (ref 150–400)
RBC: 3.54 MIL/uL — AB (ref 4.22–5.81)
RDW: 14.1 % (ref 11.5–15.5)
WBC: 3.4 10*3/uL — AB (ref 4.0–10.5)

## 2016-02-05 LAB — BASIC METABOLIC PANEL
ANION GAP: 12 (ref 5–15)
BUN: 5 mg/dL — ABNORMAL LOW (ref 6–20)
CHLORIDE: 101 mmol/L (ref 101–111)
CO2: 25 mmol/L (ref 22–32)
Calcium: 8.6 mg/dL — ABNORMAL LOW (ref 8.9–10.3)
Creatinine, Ser: 0.58 mg/dL — ABNORMAL LOW (ref 0.61–1.24)
GFR calc non Af Amer: >60 mL/min (ref >60)
Glucose, Bld: 86 mg/dL (ref 65–99)
POTASSIUM: 4.1 mmol/L (ref 3.5–5.1)
SODIUM: 138 mmol/L (ref 135–145)

## 2016-02-05 LAB — I-STAT TROPONIN, ED: Troponin i, poc: 0 ng/mL (ref 0.00–0.08)

## 2016-02-05 LAB — ETHANOL: ALCOHOL ETHYL (B): 405 mg/dL — AB (ref <5)

## 2016-02-05 MED ORDER — IPRATROPIUM-ALBUTEROL 0.5-2.5 (3) MG/3ML IN SOLN
3.0000 mL | Freq: Once | RESPIRATORY_TRACT | Status: AC
  Administered 2016-02-05: 3 mL via RESPIRATORY_TRACT
  Filled 2016-02-05: qty 3

## 2016-02-05 MED ORDER — ALBUTEROL SULFATE HFA 108 (90 BASE) MCG/ACT IN AERS
1.0000 | INHALATION_SPRAY | Freq: Once | RESPIRATORY_TRACT | Status: AC
  Administered 2016-02-06: 1 via RESPIRATORY_TRACT
  Filled 2016-02-05: qty 6.7

## 2016-02-05 MED ORDER — IOPAMIDOL (ISOVUE-300) INJECTION 61%
INTRAVENOUS | Status: AC
  Administered 2016-02-05: 75 mL
  Filled 2016-02-05: qty 75

## 2016-02-05 NOTE — ED Notes (Addendum)
Brought via EMS from home with c/o left sided chest pain with productive cough with thick green sputum.  Reports this has been going on for 3 days or more.  Hx of Pna.  Uses ventolin inhaler twice a day but reports he didn't use it today.  Drank 60 oz of beer today.  Given Asa 324mg  and ntg sl X 1 enroute.

## 2016-02-05 NOTE — ED Provider Notes (Signed)
CSN: [id number]     Arrival date & time 02/05/16  1949 History   First MD Initiated Contact with Patient 02/05/16 2106     Chief Complaint  Patient presents with  . Shortness of Breath  . Chest Pain  . Cough     (Consider location/radiation/quality/duration/timing/severity/associated sxs/prior Treatment) HPI [name] is a 50 y.o. male with history of hypertension, COPD, alcohol abuse, presents to emergency department complaining of shortness of breath and chest tightness. Patient states he has had cough for approximately a week. States cough is productive with green thick sputum. He has had no increased shortness of breath for 3 days. States has history of pneumonia. He ran out of his inhaler several days ago. Prior to that he has use twice a day. Patient was given 224 mg of aspirin and 1 sublingual nitroglycerin on the way here. Admits to drinking alcohol, states drinks 16 ounces of beer this morning. Denies swelling in extremities. Denies pain in lower legs. No recent travel or surgeries. No cardiac history.  Past Medical History  Diagnosis Date  . Hypertension   . Shortness of breath   . COPD (chronic obstructive pulmonary disease) (HCC)   . Depression   . Alcohol abuse   . Allergy   . Anxiety   . Arthritis    Past Surgical History  Procedure Laterality Date  . Hemorrhoid surgery    . Mandible fracture surgery    . Back surgery    . Hernia repair     Family History  Problem Relation Age of Onset  . Hypertension Mother   . Hypertension Father   . Hypertension Brother    Social History  Substance Use Topics  . Smoking status: Current Every Day Smoker -- 2.00 packs/day    Types: Cigarettes  . Smokeless tobacco: Never Used  . Alcohol Use: 6.0 oz/week    10 Cans of beer per week     Comment: 6- 40's daily     Review of Systems  Constitutional: Negative for fever and chills.  Respiratory: Positive for cough, chest tightness, shortness of breath and wheezing.    Cardiovascular: Positive for chest pain. Negative for palpitations and leg swelling.  Gastrointestinal: Negative for nausea, vomiting, abdominal pain, diarrhea and abdominal distention.  Genitourinary: Negative for dysuria, urgency, frequency and hematuria.  Musculoskeletal: Negative for myalgias, arthralgias, neck pain and neck stiffness.  Skin: Negative for rash.  Allergic/Immunologic: Negative for immunocompromised state.  Neurological: Negative for dizziness, weakness, light-headedness, numbness and headaches.  All other systems reviewed and are negative.     Allergies  Review of patient's allergies indicates no known allergies.  Home Medications   Prior to Admission medications   Medication Sig Start Date End Date Taking? Authorizing Provider  albuterol (PROVENTIL HFA;VENTOLIN HFA) 108 (90 Base) MCG/ACT inhaler Inhale 2 puffs into the lungs every 4 (four) hours as needed for wheezing or shortness of breath. 11/02/15  Yes Shon Baton, MD  Aspirin-Salicylamide-Caffeine (BC FAST PAIN RELIEF) [phone] MG PACK Take 1 Package by mouth daily.   Yes Historical Provider, MD  diclofenac sodium (VOLTAREN) 1 % GEL Apply 2 g topically 4 (four) times daily. Patient taking differently: Apply 2 g topically daily as needed (for pain).  11/17/15  Yes Sherren Mocha, MD  amoxicillin-clavulanate (AUGMENTIN) 875-125 MG tablet Take 1 tablet by mouth 2 (two) times daily. 11/17/15   Sherren Mocha, MD  ondansetron (ZOFRAN) 4 MG tablet Take 1 tablet (4 mg total) by mouth every  8 (eight) hours as needed for nausea or vomiting. 11/28/15   Francoise Ceo, DO  predniSONE (DELTASONE) 20 MG tablet Take 2 tablets (40 mg total) by mouth daily with breakfast. 11/17/15   Sherren Mocha, MD  prenatal vitamin w/FE, FA (PRENATAL 1 + 1) 27-1 MG TABS tablet Take 1 tablet by mouth daily. 11/17/15   Sherren Mocha, MD  thiamine (VITAMIN B-1) 100 MG tablet Take 1 tablet (100 mg total) by mouth daily. 11/17/15   Sherren Mocha, MD   BP  121/88 mmHg  Pulse 91  Temp(Src) 98.3 F (36.8 C) (Oral)  Resp 17  Ht 5\' 5"  (1.651 m)  Wt 58.968 kg  BMI 21.63 kg/m2  SpO2 91% Physical Exam  Constitutional: He is oriented to person, place, and time. He appears well-developed and well-nourished.  Patient appears to be intoxicated  HENT:  Head: Normocephalic and atraumatic.  Eyes: Conjunctivae are normal.  Neck: Neck supple.  Cardiovascular: Normal rate, regular rhythm and normal heart sounds.   Pulmonary/Chest: Effort normal. No respiratory distress. He has wheezes. He has no rales.  End expiratory wheezes bilaterally  Abdominal: Soft. Bowel sounds are normal. He exhibits no distension. There is no tenderness. There is no rebound.  Musculoskeletal: He exhibits no edema.  Neurological: He is alert and oriented to person, place, and time. No cranial nerve deficit. Coordination normal.  Skin: Skin is warm and dry.  Nursing note and vitals reviewed.   ED Course  Procedures (including critical care time) Labs Review Labs Reviewed  BASIC METABOLIC PANEL - Abnormal; Notable for the following:    BUN 5 (*)    Creatinine, Ser 0.58 (*)    Calcium 8.6 (*)    All other components within normal limits  CBC - Abnormal; Notable for the following:    WBC 3.4 (*)    RBC 3.54 (*)    Hemoglobin 12.0 (*)    HCT 34.4 (*)    Platelets 132 (*)    All other components within normal limits  ETHANOL  I-STAT TROPOININ, ED    Imaging Review Dg Chest 2 View  02/05/2016  CLINICAL DATA:  Left-sided chest pain with productive cough for 3 days EXAM: CHEST  2 VIEW COMPARISON:  11/28/2015 FINDINGS: Heart size now appears mildly enlarged as compared to normal size previously. It may be exaggerated by lordotic positioning. On the right there is minimal linear density in the upper lobe likely representing scarring related to prior pneumonia in this area seen on prior March and April 2017 chest radiographs. There is also mild right upper lobe atelectasis.  There is minimal scarring in the left lung base is well in in area of prior pneumonia. No acute appearing infiltrate. No pleural effusion. IMPRESSION: Minimal scarring bilaterally in areas of prior pneumonia. Mild right upper lobe atelectasis. Findings are likely chronic and postinflammatory but if there is any concern for central partially obstructing lesion a contrast-enhanced CT thorax would be suggested. There is no evidence of acute pneumonia. Electronically Signed   By: Esperanza Heir M.D.   On: 02/05/2016 20:46   I have personally reviewed and evaluated these images and lab results as part of my medical decision-making.   EKG Interpretation None      MDM   Final diagnoses:  Chronic obstructive pulmonary disease, unspecified COPD type (HCC)  Alcohol intoxication, uncomplicated [facility])    Patient emergency department with cough, chest pain, shortness of breath. States has history of pneumonia. He is coughing up  green sputum. He is afebrile, normal vital signs, normal oxygen saturation on room air. Patient does have some wheezing. Will order a breathing treatment. Get chest x-ray and labs. Patient appears to be intoxicated will check alcohol level.  Patient's alcohol level is 405. He is however awake, protecting airway, and is able to keep a conversation. I suspect patient's baseline alcohol level is high. Patient is here with his 2 sisters. He had a shadow on his chest x-ray and CT of chest was recommended for further evaluation. CT of chest is normal. I will give patient another inhaler. At this time vital signs are normal. His sisters will take him home. FU with PCP recommended ASAP  Filed Vitals:   02/05/16 2200 02/05/16 2206 02/05/16 2300 02/06/16 0011  BP:  107/77 113/85   Pulse: 52 89 86   Temp:      TempSrc:      Resp: 39 17 15   Height:      Weight:      SpO2: 92%  94% 100%     Jaynie Crumbleatyana Letticia Bhattacharyya, PA-C 02/06/16 0108  Melene Planan Floyd, DO 02/06/16 0700

## 2016-02-05 NOTE — ED Notes (Signed)
Taken to CT at this time. 

## 2016-02-06 DIAGNOSIS — J449 Chronic obstructive pulmonary disease, unspecified: Secondary | ICD-10-CM | POA: Diagnosis not present

## 2016-02-06 NOTE — Discharge Instructions (Signed)
Use inhaler 2 puffs every 4 hrs. Stop smoking. Stop drinking alcohol. Follow up with your family doctor for recheck.    Alcohol Intoxication Alcohol intoxication occurs when the amount of alcohol that a person has consumed impairs his or her ability to mentally and physically function. Alcohol directly impairs the normal chemical activity of the brain. Drinking large amounts of alcohol can lead to changes in mental function and behavior, and it can cause many physical effects that can be harmful.  Alcohol intoxication can range in severity from mild to very severe. Various factors can affect the level of intoxication that occurs, such as the person's age, gender, weight, frequency of alcohol consumption, and the presence of other medical conditions (such as diabetes, seizures, or heart conditions). Dangerous levels of alcohol intoxication may occur when people drink large amounts of alcohol in a short period (binge drinking). Alcohol can also be especially dangerous when combined with certain prescription medicines or "recreational" drugs. SIGNS AND SYMPTOMS Some common signs and symptoms of mild alcohol intoxication include:  Loss of coordination.  Changes in mood and behavior.  Impaired judgment.  Slurred speech. As alcohol intoxication progresses to more severe levels, other signs and symptoms will appear. These may include:  Vomiting.  Confusion and impaired memory.  Slowed breathing.  Seizures.  Loss of consciousness. DIAGNOSIS  Your health care provider will take a medical history and perform a physical exam. You will be asked about the amount and type of alcohol you have consumed. Blood tests will be done to measure the concentration of alcohol in your blood. In many places, your blood alcohol level must be lower than 80 mg/dL (1.61%) to legally drive. However, many dangerous effects of alcohol can occur at much lower levels.  TREATMENT  People with alcohol intoxication often do  not require treatment. Most of the effects of alcohol intoxication are temporary, and they go away as the alcohol naturally leaves the body. Your health care provider will monitor your condition until you are stable enough to go home. Fluids are sometimes given through an IV access tube to help prevent dehydration.  HOME CARE INSTRUCTIONS  Do not drive after drinking alcohol.  Stay hydrated. Drink enough water and fluids to keep your urine clear or pale yellow. Avoid caffeine.   Only take over-the-counter or prescription medicines as directed by your health care provider.  SEEK MEDICAL CARE IF:   You have persistent vomiting.   You do not feel better after a few days.  You have frequent alcohol intoxication. Your health care provider can help determine if you should see a substance use treatment counselor. SEEK IMMEDIATE MEDICAL CARE IF:   You become shaky or tremble when you try to stop drinking.   You shake uncontrollably (seizure).   You throw up (vomit) blood. This may be bright red or may look like black coffee grounds.   You have blood in your stool. This may be bright red or may appear as a black, tarry, bad smelling stool.   You become lightheaded or faint.  MAKE SURE YOU:  1. Understand these instructions. 2. Will watch your condition. 3. Will get help right away if you are not doing well or get worse.   This information is not intended to replace advice given to you by your health care provider. Make sure you discuss any questions you have with your health care provider.   Document Released: 05/17/2005 Document Revised: 04/09/2013 Document Reviewed: 01/10/2013 Elsevier Interactive Patient Education 2016 Elsevier  Inc. Chronic Obstructive Pulmonary Disease Chronic obstructive pulmonary disease (COPD) is a common lung condition in which airflow from the lungs is limited. COPD is a general term that can be used to describe many different lung problems that limit  airflow, including both chronic bronchitis and emphysema. If you have COPD, your lung function will probably never return to normal, but there are measures you can take to improve lung function and make yourself feel better. CAUSES   Smoking (common).  Exposure to secondhand smoke.  Genetic problems.  Chronic inflammatory lung diseases or recurrent infections. SYMPTOMS  Shortness of breath, especially with physical activity.  Deep, persistent (chronic) cough with a large amount of thick mucus.  Wheezing.  Rapid breaths (tachypnea).  Gray or bluish discoloration (cyanosis) of the skin, especially in your fingers, toes, or lips.  Fatigue.  Weight loss.  Frequent infections or episodes when breathing symptoms become much worse (exacerbations).  Chest tightness. DIAGNOSIS Your health care provider will take a medical history and perform a physical examination to diagnose COPD. Additional tests for COPD may include:  Lung (pulmonary) function tests.  Chest X-ray.  CT scan.  Blood tests. TREATMENT  Treatment for COPD may include:  Inhaler and nebulizer medicines. These help manage the symptoms of COPD and make your breathing more comfortable.  Supplemental oxygen. Supplemental oxygen is only helpful if you have a low oxygen level in your blood.  Exercise and physical activity. These are beneficial for nearly all people with COPD.  Lung surgery or transplant.  Nutrition therapy to gain weight, if you are underweight.  Pulmonary rehabilitation. This may involve working with a team of health care providers and specialists, such as respiratory, occupational, and physical therapists. HOME CARE INSTRUCTIONS  Take all medicines (inhaled or pills) as directed by your health care provider.  Avoid over-the-counter medicines or cough syrups that dry up your airway (such as antihistamines) and slow down the elimination of secretions unless instructed otherwise by your health  care provider.  If you are a smoker, the most important thing that you can do is stop smoking. Continuing to smoke will cause further lung damage and breathing trouble. Ask your health care provider for help with quitting smoking. He or she can direct you to community resources or hospitals that provide support.  Avoid exposure to irritants such as smoke, chemicals, and fumes that aggravate your breathing.  Use oxygen therapy and pulmonary rehabilitation if directed by your health care provider. If you require home oxygen therapy, ask your health care provider whether you should purchase a pulse oximeter to measure your oxygen level at home.  Avoid contact with individuals who have a contagious illness.  Avoid extreme temperature and humidity changes.  Eat healthy foods. Eating smaller, more frequent meals and resting before meals may help you maintain your strength.  Stay active, but balance activity with periods of rest. Exercise and physical activity will help you maintain your ability to do things you want to do.  Preventing infection and hospitalization is very important when you have COPD. Make sure to receive all the vaccines your health care provider recommends, especially the pneumococcal and influenza vaccines. Ask your health care provider whether you need a pneumonia vaccine.  Learn and use relaxation techniques to manage stress.  Learn and use controlled breathing techniques as directed by your health care provider. Controlled breathing techniques include:  Pursed lip breathing. Start by breathing in (inhaling) through your nose for 1 second. Then, purse your lips as if  you were going to whistle and breathe out (exhale) through the pursed lips for 2 seconds.  Diaphragmatic breathing. Start by putting one hand on your abdomen just above your waist. Inhale slowly through your nose. The hand on your abdomen should move out. Then purse your lips and exhale slowly. You should be able  to feel the hand on your abdomen moving in as you exhale.  Learn and use controlled coughing to clear mucus from your lungs. Controlled coughing is a series of short, progressive coughs. The steps of controlled coughing are: 4. Lean your head slightly forward. 5. Breathe in deeply using diaphragmatic breathing. 6. Try to hold your breath for 3 seconds. 7. Keep your mouth slightly open while coughing twice. 8. Spit any mucus out into a tissue. 9. Rest and repeat the steps once or twice as needed. SEEK MEDICAL CARE IF:  You are coughing up more mucus than usual.  There is a change in the color or thickness of your mucus.  Your breathing is more labored than usual.  Your breathing is faster than usual. SEEK IMMEDIATE MEDICAL CARE IF:  You have shortness of breath while you are resting.  You have shortness of breath that prevents you from:  Being able to talk.  Performing your usual physical activities.  You have chest pain lasting longer than 5 minutes.  Your skin color is more cyanotic than usual.  You measure low oxygen saturations for longer than 5 minutes with a pulse oximeter. MAKE SURE YOU:  Understand these instructions.  Will watch your condition.  Will get help right away if you are not doing well or get worse.   This information is not intended to replace advice given to you by your health care provider. Make sure you discuss any questions you have with your health care provider.   Document Released: 05/17/2005 Document Revised: 08/28/2014 Document Reviewed: 04/03/2013 Elsevier Interactive Patient Education [employer]2016 Elsevier Inc.

## 2016-05-26 DIAGNOSIS — H50111 Monocular exotropia, right eye: Secondary | ICD-10-CM | POA: Diagnosis not present

## 2016-05-26 DIAGNOSIS — H2513 Age-related nuclear cataract, bilateral: Secondary | ICD-10-CM | POA: Diagnosis not present

## 2016-10-09 ENCOUNTER — Encounter (HOSPITAL_COMMUNITY): Payer: Self-pay

## 2016-10-09 ENCOUNTER — Emergency Department (HOSPITAL_COMMUNITY)
Admission: EM | Admit: 2016-10-09 | Discharge: 2016-10-09 | Disposition: A | Discharge status: Home / Self Care | Payer: Medicare HMO | Attending: Emergency Medicine | Admitting: Emergency Medicine

## 2016-10-09 DIAGNOSIS — I10 Essential (primary) hypertension: Secondary | ICD-10-CM | POA: Diagnosis not present

## 2016-10-09 DIAGNOSIS — Z5321 Procedure and treatment not carried out due to patient leaving prior to being seen by health care provider: Secondary | ICD-10-CM | POA: Insufficient documentation

## 2016-10-09 DIAGNOSIS — G8929 Other chronic pain: Secondary | ICD-10-CM | POA: Insufficient documentation

## 2016-10-09 DIAGNOSIS — M549 Dorsalgia, unspecified: Secondary | ICD-10-CM | POA: Diagnosis not present

## 2016-10-09 DIAGNOSIS — M79605 Pain in left leg: Secondary | ICD-10-CM | POA: Diagnosis not present

## 2016-10-09 DIAGNOSIS — J449 Chronic obstructive pulmonary disease, unspecified: Secondary | ICD-10-CM | POA: Diagnosis not present

## 2016-10-09 DIAGNOSIS — F17210 Nicotine dependence, cigarettes, uncomplicated: Secondary | ICD-10-CM | POA: Insufficient documentation

## 2016-10-09 NOTE — ED Triage Notes (Signed)
Patient complains of chronic back and lef pain, states that he has increased numbness and pain to left leg, using crutch to walk

## 2016-10-09 NOTE — ED Notes (Signed)
Called pt name three times in lobby for vitals. No response.

## 2016-10-09 NOTE — ED Notes (Signed)
Called pt name three times to be roomed. No response.

## 2016-10-09 NOTE — ED Notes (Signed)
Called pt with no answer.

## 2016-10-10 ENCOUNTER — Encounter (HOSPITAL_COMMUNITY): Payer: Self-pay | Admitting: Emergency Medicine

## 2016-10-10 ENCOUNTER — Emergency Department (HOSPITAL_COMMUNITY): Payer: Medicare HMO

## 2016-10-10 ENCOUNTER — Emergency Department (HOSPITAL_BASED_OUTPATIENT_CLINIC_OR_DEPARTMENT_OTHER)
Admit: 2016-10-10 | Discharge: 2016-10-10 | Disposition: A | Discharge status: Home / Self Care | Payer: Medicare HMO | Attending: Emergency Medicine | Admitting: Emergency Medicine

## 2016-10-10 ENCOUNTER — Emergency Department (HOSPITAL_COMMUNITY)
Admission: EM | Admit: 2016-10-10 | Discharge: 2016-10-10 | Disposition: A | Discharge status: Home / Self Care | Payer: Medicare HMO | Attending: Emergency Medicine | Admitting: Emergency Medicine

## 2016-10-10 DIAGNOSIS — M79605 Pain in left leg: Secondary | ICD-10-CM

## 2016-10-10 DIAGNOSIS — J449 Chronic obstructive pulmonary disease, unspecified: Secondary | ICD-10-CM | POA: Insufficient documentation

## 2016-10-10 DIAGNOSIS — I10 Essential (primary) hypertension: Secondary | ICD-10-CM | POA: Diagnosis not present

## 2016-10-10 DIAGNOSIS — M79652 Pain in left thigh: Secondary | ICD-10-CM | POA: Diagnosis not present

## 2016-10-10 DIAGNOSIS — F17210 Nicotine dependence, cigarettes, uncomplicated: Secondary | ICD-10-CM | POA: Insufficient documentation

## 2016-10-10 DIAGNOSIS — Z7982 Long term (current) use of aspirin: Secondary | ICD-10-CM | POA: Diagnosis not present

## 2016-10-10 DIAGNOSIS — M79609 Pain in unspecified limb: Secondary | ICD-10-CM | POA: Diagnosis not present

## 2016-10-10 DIAGNOSIS — Z79899 Other long term (current) drug therapy: Secondary | ICD-10-CM | POA: Insufficient documentation

## 2016-10-10 MED ORDER — HYDROCODONE-ACETAMINOPHEN 5-325 MG PO TABS
1.0000 | ORAL_TABLET | Freq: Once | ORAL | Status: AC
  Administered 2016-10-10: 1 via ORAL
  Filled 2016-10-10: qty 1

## 2016-10-10 NOTE — ED Notes (Signed)
Bed: WA21 Expected date:  Expected time:  Means of arrival:  Comments: 

## 2016-10-10 NOTE — ED Notes (Signed)
Ultrasound at bedside

## 2016-10-10 NOTE — ED Notes (Signed)
ED Provider at bedside. 

## 2016-10-10 NOTE — ED Notes (Signed)
Pt verbalized understanding of discharge instructions and denies any further questions at this time.   

## 2016-10-10 NOTE — ED Provider Notes (Signed)
[facility]-EMERGENCY DEPT Provider Note   CSN: [id number] Arrival date & time: 10/10/16 0809     History    Chief Complaint  Patient presents with  . Leg Pain    left     HPI [name] is a 51 y.o. male.  51yo M w/ PMH below including COPD, depression, alcohol abuse who p/w L leg pain. Patient reports a one-month history of progressively worsening, atraumatic left thigh pain. Pain is worse when he ambulates and better when he is resting. He has not noticed any swelling or redness, no pain in his knee or hip. No numbness or weakness of his left foot. He denies any associated buttock or back pain. No fevers or recent illness. No recent prolonged immobilization, history of blood clots, or history of cancer. He has not tried any therapies for his symptoms.   Past Medical History:  Diagnosis Date  . Alcohol abuse   . Allergy   . Anxiety   . Arthritis   . COPD (chronic obstructive pulmonary disease) (HCC)   . Depression   . Hypertension   . Shortness of breath      Patient Active Problem List   Diagnosis Date Noted  . COPD (chronic obstructive pulmonary disease) (HCC) 11/17/2015  . Alcohol dependence with uncomplicated withdrawal (HCC) 06/29/2014  . S/P alcohol detoxification 01/20/2014  . Pain of right thigh 01/20/2014  . Salicylate overdose 01/20/2014  . Alcohol abuse, continuous 09/24/2012    Class: Acute  . Alcohol dependence (HCC) 09/24/2012  . Alcohol withdrawal (HCC) 09/24/2012    Past Surgical History:  Procedure Laterality Date  . BACK SURGERY    . HEMORRHOID SURGERY    . HERNIA REPAIR    . MANDIBLE FRACTURE SURGERY          Home Medications    Prior to Admission medications   Medication Sig Start Date End Date Taking? Authorizing Provider  albuterol (PROVENTIL HFA;VENTOLIN HFA) 108 (90 Base) MCG/ACT inhaler Inhale 2 puffs into the lungs every 4 (four) hours as needed for wheezing or shortness of breath. 11/02/15  Yes Shon Baton, MD    Aspirin-Caffeine (BC FAST PAIN RELIEF PO) Take 1 packet by mouth every 6 (six) hours as needed (for headaches).   Yes Historical Provider, MD      Family History  Problem Relation Age of Onset  . Hypertension Mother   . Hypertension Father   . Hypertension Brother      Social History  Substance Use Topics  . Smoking status: Current Every Day Smoker    Packs/day: 2.00    Types: Cigarettes  . Smokeless tobacco: Never Used  . Alcohol use 6.0 oz/week    10 Cans of beer per week     Comment: 6- 40's daily      Allergies     Patient has no known allergies.    Review of Systems  10 Systems reviewed and are negative for acute change except as noted in the HPI.   Physical Exam Updated Vital Signs BP 132/80   Pulse 78   Temp 98.1 F (36.7 C) (Oral)   Resp 17   SpO2 98%   Physical Exam  Constitutional: He is oriented to person, place, and time. He appears well-developed and well-nourished. No distress.  HENT:  Head: Normocephalic and atraumatic.  Moist mucous membranes  Eyes: Conjunctivae are normal.  Neck: Neck supple.  Cardiovascular: Normal rate, regular rhythm, normal heart sounds and intact distal pulses.   No murmur  heard. Pulmonary/Chest: Effort normal.  Occasional faint wheeze  Abdominal: Soft. Bowel sounds are normal. He exhibits no distension. There is no tenderness.  Musculoskeletal: Normal range of motion. He exhibits no edema or deformity.  Normal ROM at L knee and hip with no joint tenderness; no focal point tenderness on L thigh and no asymmetric swelling, warmth or redness; increased muscle tone of thigh during ROM exercises  Neurological: He is alert and oriented to person, place, and time. No sensory deficit.  Fluent speech Normal strength BLE  Skin: Skin is warm and dry. No erythema.  Psychiatric: He has a normal mood and affect. Judgment normal.  Nursing note and vitals reviewed.     ED Treatments / Results  Labs (all labs ordered are  listed, but only abnormal results are displayed) Labs Reviewed - No data to display   EKG  EKG Interpretation  Date/Time:    Ventricular Rate:    PR Interval:    QRS Duration:   QT Interval:    QTC Calculation:   R Axis:     Text Interpretation:           Radiology Dg Femur Min 2 Views Left  Result Date: 10/10/2016 CLINICAL DATA:  Worsening left thigh pain for 1 month without trauma. EXAM: LEFT FEMUR 2 VIEWS COMPARISON:  None. FINDINGS: No fracture or focal osseous lesion is identified. Left hip joint space width appears preserved without significant arthropathic changes identified. No significant soft tissue abnormality is seen. IMPRESSION: Negative. Electronically Signed   By: Sebastian AcheAllen  Grady M.D.   On: 10/10/2016 10:58    Procedures Procedures (including critical care time) Procedures  Medications Ordered in ED  Medications  HYDROcodone-acetaminophen (NORCO/VICODIN) 5-325 MG per tablet 1 tablet (1 tablet Oral Given 10/10/16 1033)     Initial Impression / Assessment and Plan / ED Course  I have reviewed the triage vital signs and the nursing notes.  Pertinent  imaging results that were available during my care of the patient were reviewed by me and considered in my medical decision making (see chart for details).     PT w/ 1 month of atraumatic progressive L thigh pain, no swelling or redness and no neuro deficits. No focal swelling and no pain during range of motion of knee and hip. All of his pain is anterior and he denies any associated buttock or back pain to suggest nerve related pain stemming from lower back. Obtained plain film of thigh to rule out bony abnormality and obtained ultrasound to rule out DVT.  X-ray unremarkable and ultrasound negative for DVT. Given his reassuring exam, no focal weakness or evidence of infection, I feel he is appropriate for outpatient follow-up. I have provided with community health and wellness information and instructed to call for  PCP appointment so that he can have referral to physical therapy. Patient voiced understanding and ambulatory at time of discharge.  Final Clinical Impressions(s) / ED Diagnoses   Final diagnoses:  Left thigh pain     Discharge Medication List as of 10/10/2016  2:46 PM         Laurence Spatesachel Morgan Little, MD 10/10/16 1506

## 2016-10-10 NOTE — ED Notes (Signed)
Pt upset and wanting an update.  Will notify Primary RN and EDP.

## 2016-10-10 NOTE — ED Triage Notes (Addendum)
Per PTAR patient comes from home for left leg pain x month. patient denies any injury or lifting moving to have caused the pain. [facility] same but left without being seen due to lobby wait.  Vitals: 124/90, 82Hr, 16R. 97% on room air. Patient does have one crutch that he uses as assistance, patietn was ambulatory with EMS.

## 2016-10-10 NOTE — Progress Notes (Signed)
VASCULAR LAB PRELIMINARY  PRELIMINARY  PRELIMINARY  PRELIMINARY  Left lower extremity venous duplex completed.    Preliminary report:  Left:  No evidence of DVT, superficial thrombosis, or Baker's cyst.  [name], RVS 10/10/2016, 1:23 PM

## 2016-10-14 IMAGING — CR DG CHEST 2V
3 series · 3 of 3 positions shown · non-contrast
Comparison: No prior.

CLINICAL DATA: Shortness of breath.

EXAM:
CHEST  2 VIEW

[PA (1 of 2)]
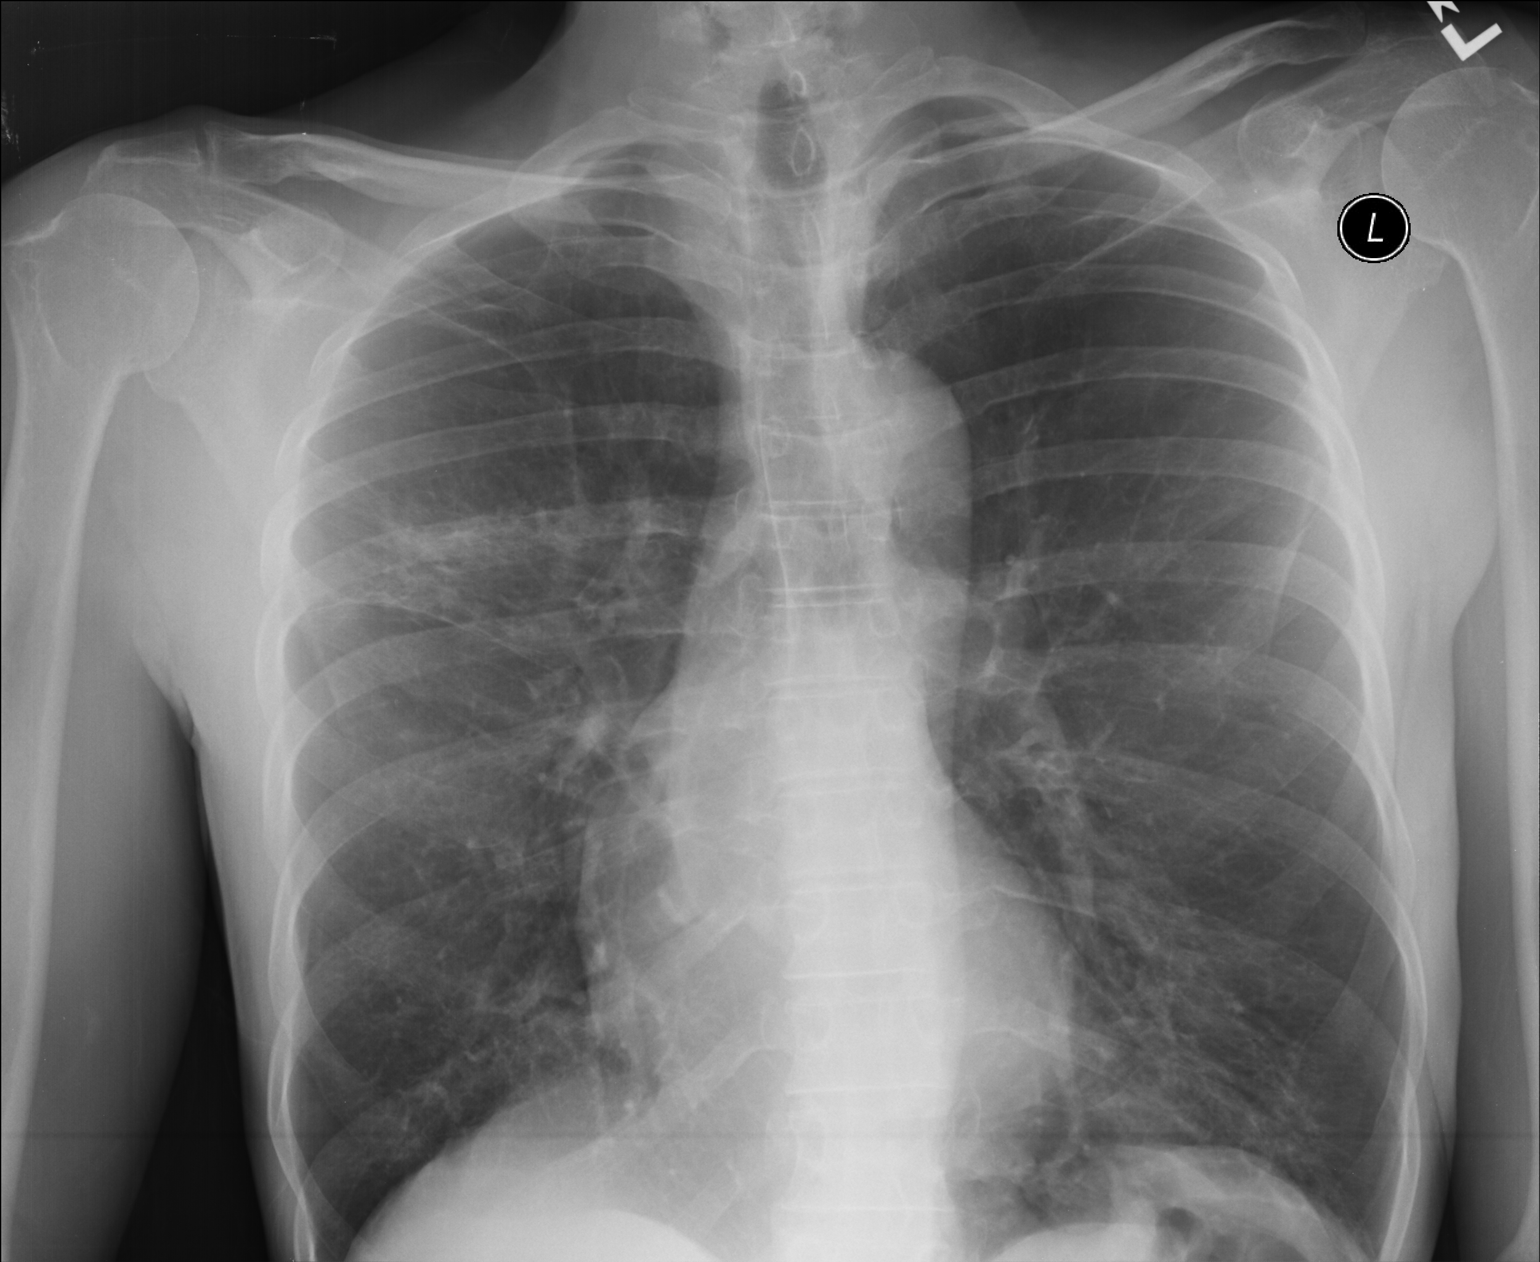

[lateral]
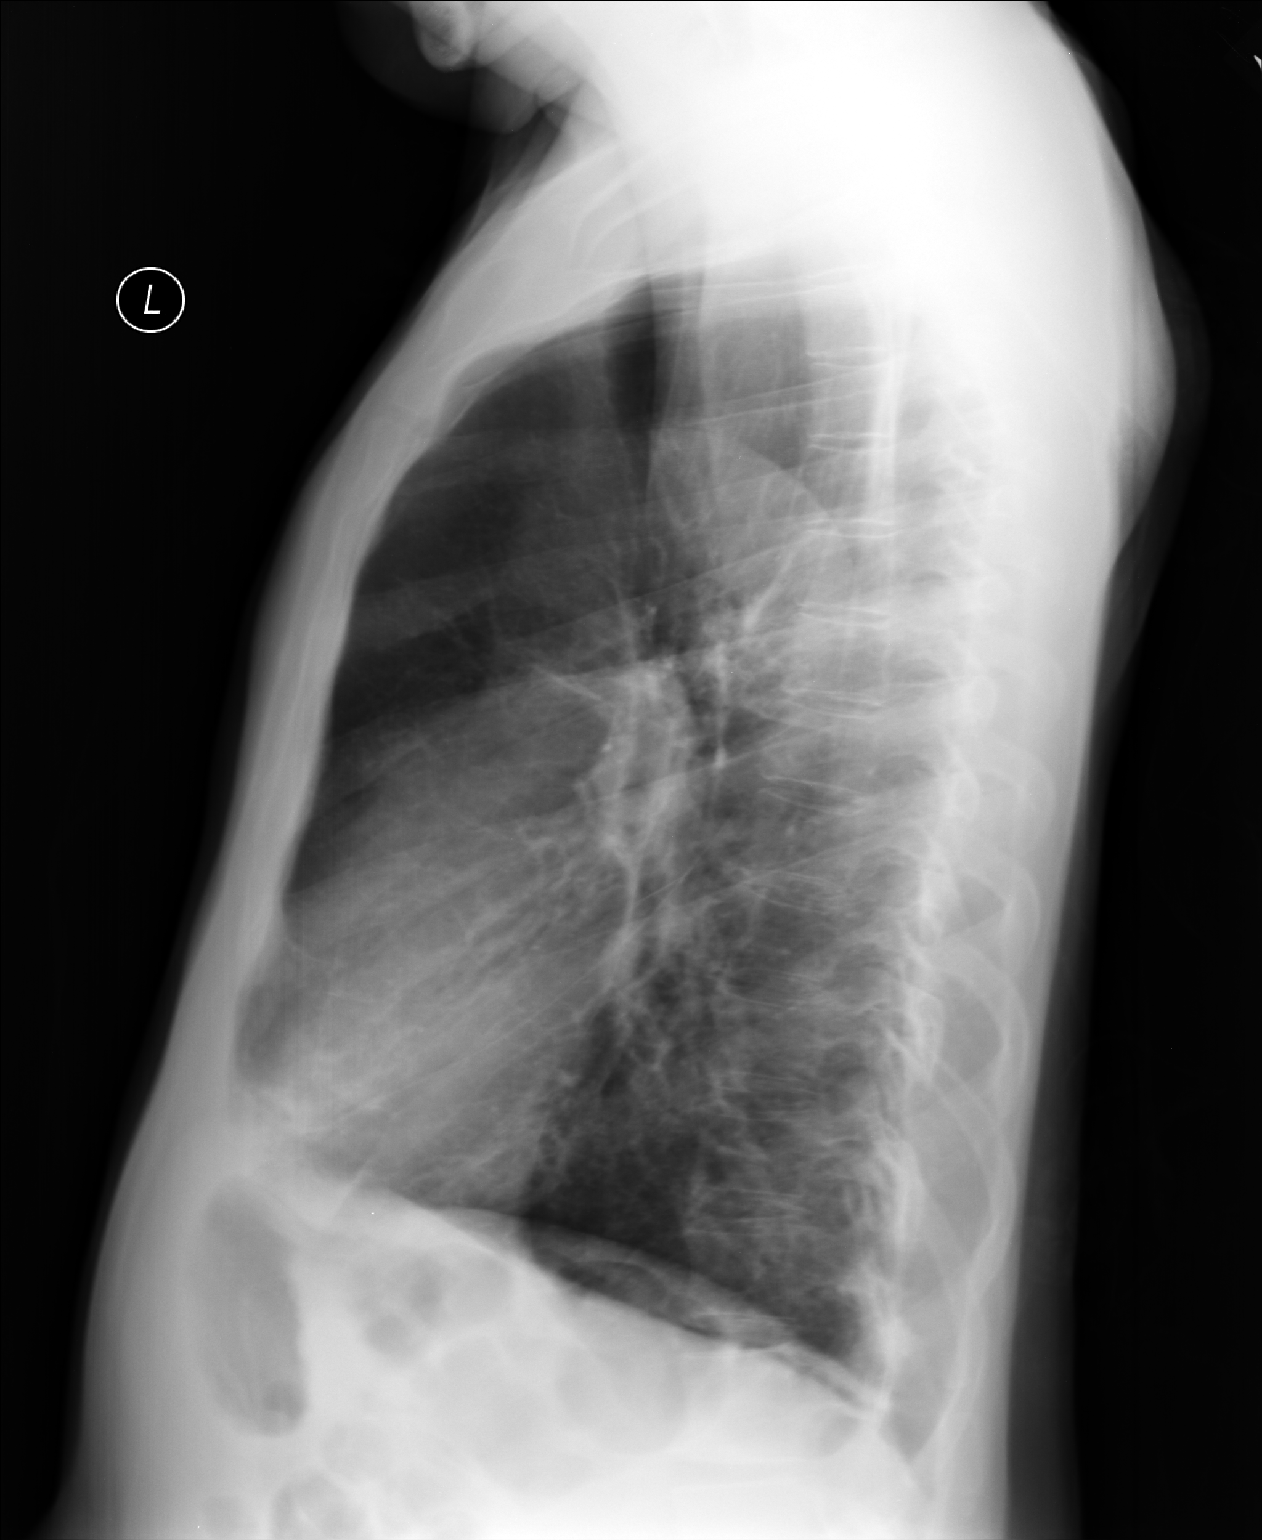

[PA (2 of 2)]
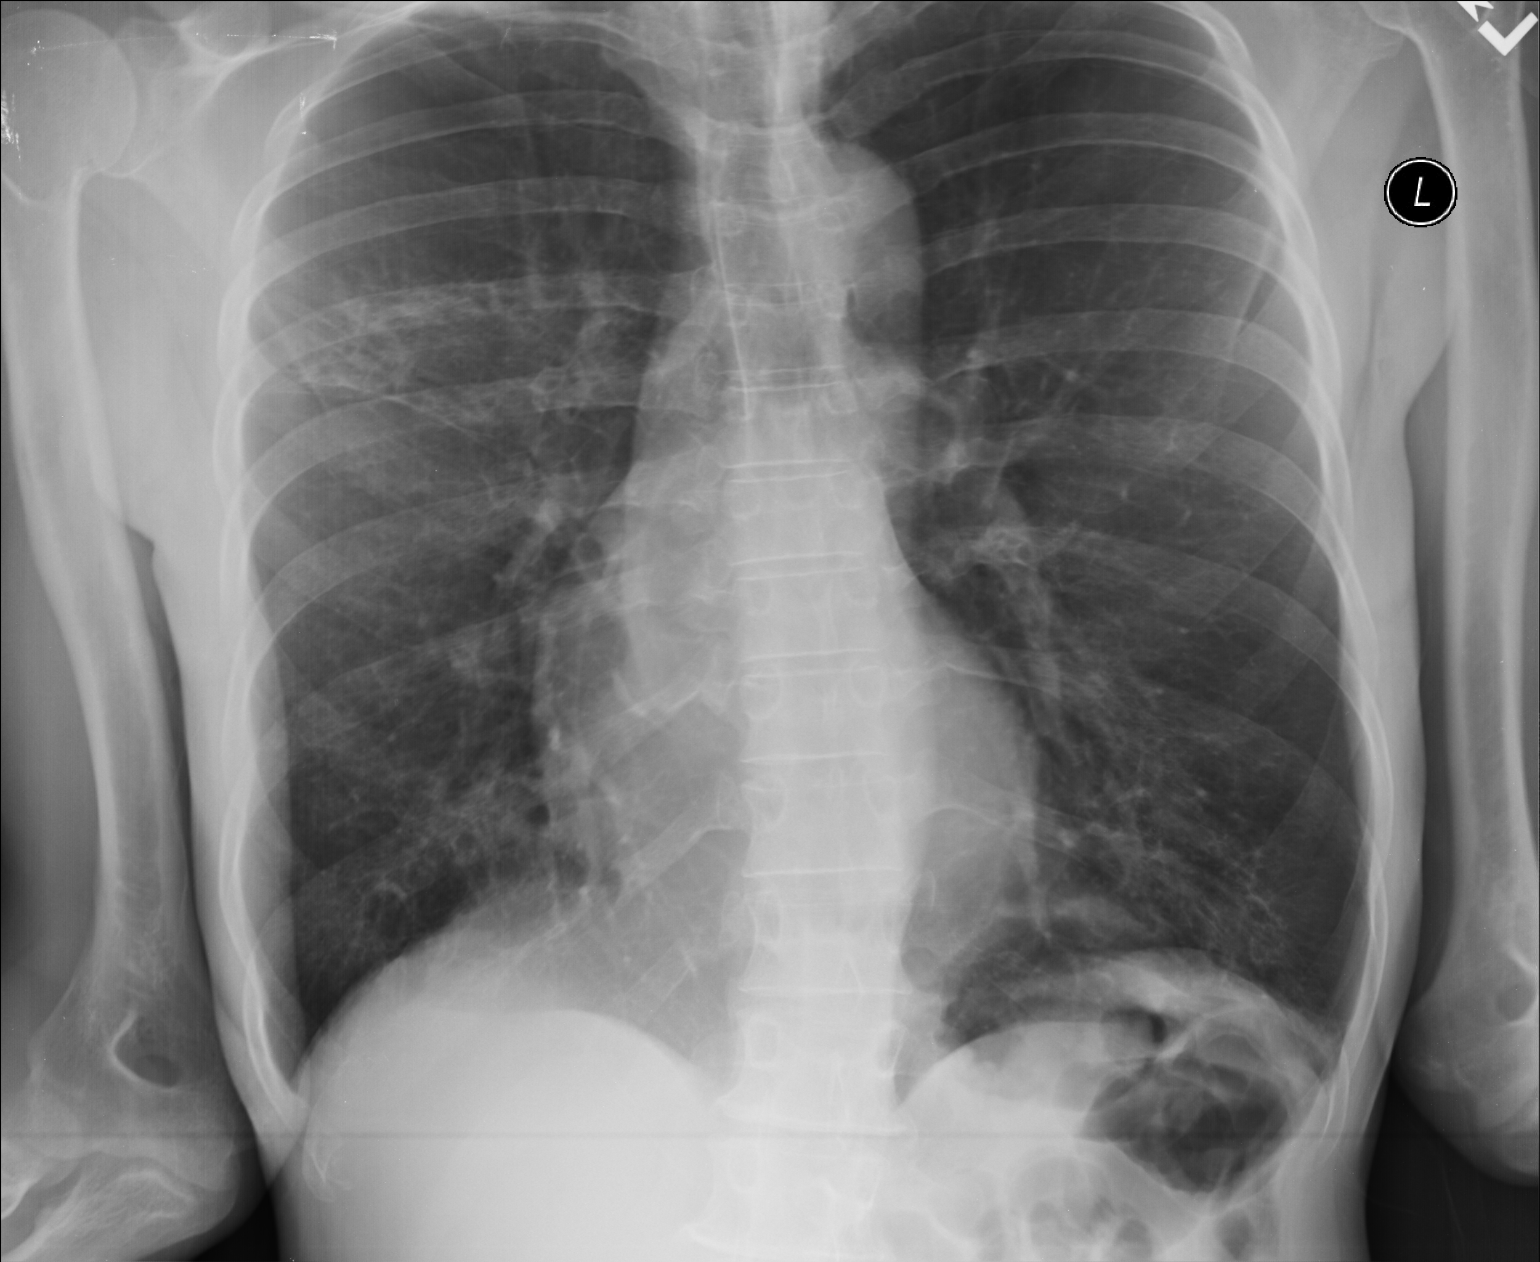

[3 of 3 positions shown; findings below may reference images not displayed]

FINDINGS: Right upper lobe and left lower lobe infiltrates noted consistent
pneumonia. Follow-up chest x-rays recommended demonstrate clearing.
Heart size normal. No pleural effusion or pneumothorax. No acute
bony abnormality P
IMPRESSION: Right upper lobe and left lower lobe pulmonary infiltrates
consistent with pneumonia.

## 2016-10-25 IMAGING — CR DG ABDOMEN ACUTE W/ 1V CHEST
3 series · 3 of 3 positions shown · non-contrast
Comparison: 11/17/2015

CLINICAL DATA: Nausea and vomiting for several weeks

EXAM:
DG ABDOMEN ACUTE W/ 1V CHEST

[chest pa]
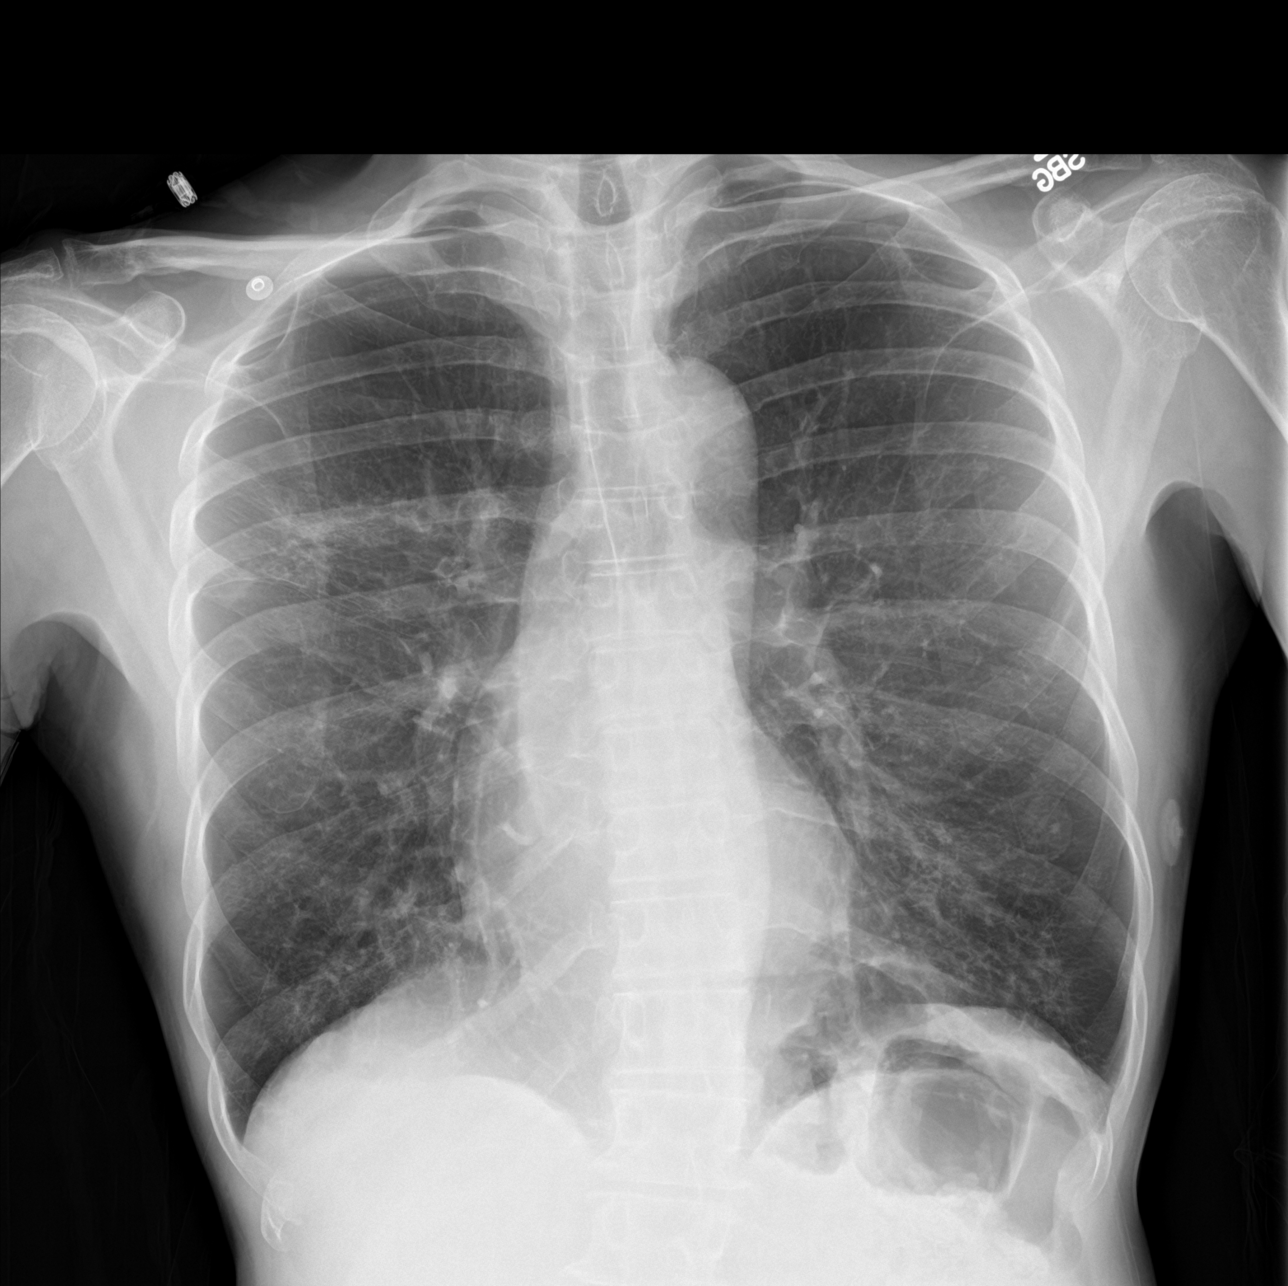

[abdomen erect]
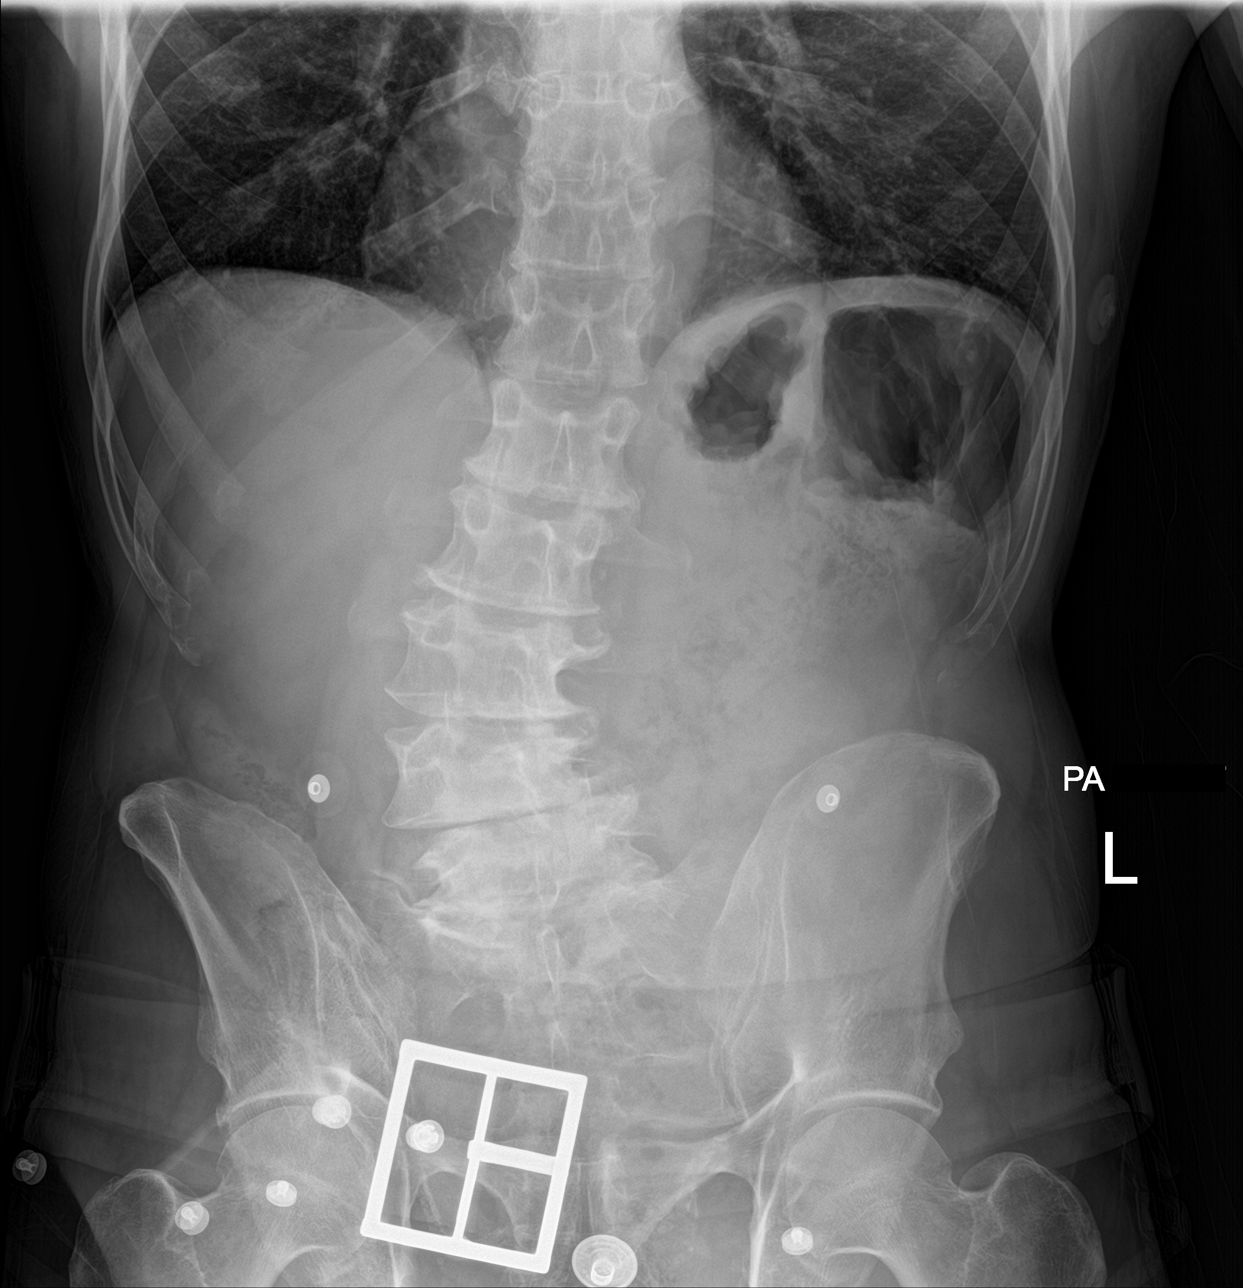

[abdomen supine]
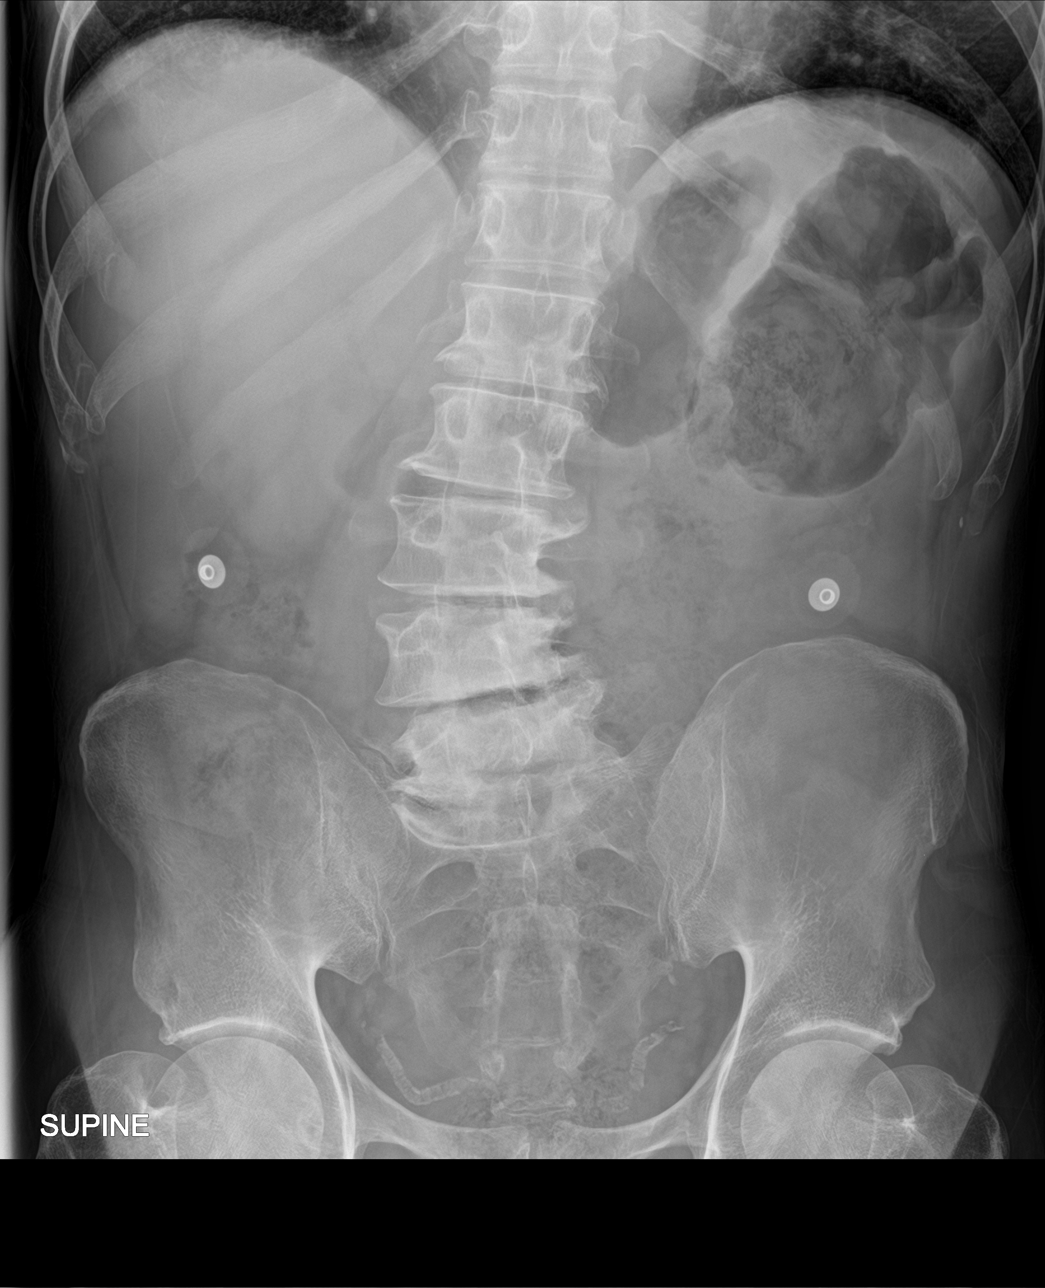

[3 of 3 positions shown; findings below may reference images not displayed]

FINDINGS: Cardiac shadow is stable. The lungs are again hyperinflated.
Improving infiltrate in the right upper lobe is seen. Stable changes
in the left base are noted likely chronic in nature. No effusion is
seen.

The abdomen shows a nonobstructive bowel gas pattern. No abnormal
mass or abnormal calcifications are noted. Degenerative changes of
lumbar spine with a scoliosis concave to the left are seen. No free
air is noted.
IMPRESSION: Improved aeration in the right upper lobe. Chronic changes in the
left base are again seen.

No acute abnormality is noted in the abdomen.

## 2016-10-26 ENCOUNTER — Inpatient Hospital Stay: Payer: Self-pay

## 2017-07-31 ENCOUNTER — Institutional Professional Consult (permissible substitution): Payer: Self-pay | Admitting: Internal Medicine

## 2017-08-07 ENCOUNTER — Ambulatory Visit (INDEPENDENT_AMBULATORY_CARE_PROVIDER_SITE_OTHER): Payer: Medicare Other | Admitting: Internal Medicine

## 2017-08-07 ENCOUNTER — Other Ambulatory Visit (INDEPENDENT_AMBULATORY_CARE_PROVIDER_SITE_OTHER): Payer: Medicare Other

## 2017-08-07 ENCOUNTER — Encounter: Payer: Self-pay | Admitting: Internal Medicine

## 2017-08-07 VITALS — BP 120/78 | HR 98 | Ht 66.0 in | Wt 124.0 lb

## 2017-08-07 DIAGNOSIS — Z8709 Personal history of other diseases of the respiratory system: Secondary | ICD-10-CM

## 2017-08-07 DIAGNOSIS — R05 Cough: Secondary | ICD-10-CM

## 2017-08-07 DIAGNOSIS — R0602 Shortness of breath: Secondary | ICD-10-CM

## 2017-08-07 DIAGNOSIS — R9389 Abnormal findings on diagnostic imaging of other specified body structures: Secondary | ICD-10-CM

## 2017-08-07 DIAGNOSIS — R053 Chronic cough: Secondary | ICD-10-CM

## 2017-08-07 DIAGNOSIS — Z01811 Encounter for preprocedural respiratory examination: Secondary | ICD-10-CM

## 2017-08-07 DIAGNOSIS — Z7289 Other problems related to lifestyle: Secondary | ICD-10-CM

## 2017-08-07 DIAGNOSIS — Z789 Other specified health status: Secondary | ICD-10-CM | POA: Diagnosis not present

## 2017-08-07 DIAGNOSIS — Z87891 Personal history of nicotine dependence: Secondary | ICD-10-CM

## 2017-08-07 LAB — CBC WITH DIFFERENTIAL/PLATELET
BASOS ABS: 0.1 10*3/uL (ref 0.0–0.1)
BASOS PCT: 0.8 % (ref 0.0–3.0)
EOS ABS: 0 10*3/uL (ref 0.0–0.7)
Eosinophils Relative: 0.4 % (ref 0.0–5.0)
HCT: 38.3 % — ABNORMAL LOW (ref 39.0–52.0)
Hemoglobin: 13 g/dL (ref 13.0–17.0)
LYMPHS ABS: 1.3 10*3/uL (ref 0.7–4.0)
Lymphocytes Relative: 20 % (ref 12.0–46.0)
MCHC: 34 g/dL (ref 30.0–36.0)
MCV: 105.9 fl — AB (ref 78.0–100.0)
MONOS PCT: 11.9 % (ref 3.0–12.0)
Monocytes Absolute: 0.8 10*3/uL (ref 0.1–1.0)
NEUTROS ABS: 4.4 10*3/uL (ref 1.4–7.7)
NEUTROS PCT: 66.9 % (ref 43.0–77.0)
PLATELETS: 268 10*3/uL (ref 150.0–400.0)
RBC: 3.61 Mil/uL — ABNORMAL LOW (ref 4.22–5.81)
RDW: 13.2 % (ref 11.5–15.5)
WBC: 6.5 10*3/uL (ref 4.0–10.5)

## 2017-08-07 NOTE — Progress Notes (Signed)
Subjective:    Patient ID: [name], male    DOB: 1966/01/19, 51 y.o.   MRN: [id number]  PCP Patient, No Pcp Per  HPI  IOV 08/07/2017  Chief Complaint  Patient presents with  . Advice Only    Pt is here for surgical clearance to see if he is clear for leg surgery. Pt does have complaints of cough which he states does it all the time. Also has complaints of SOB that could be when just sitting. Denies any CP.   51 year old African-American male.  He is here for clearance prior to some kind of a leg surgery.  He is a poor historian and history is gained from talking to him his sister and review of the past medical chart.  As best as I can gather he uses a cane to walk around and he says he needs surgery in his left leg.  He does not think it is an amputation.  He says it is a major surgery.  He showed me some papers and I believe he might be having spine surgery and he might be suffering from sciatica and is left leg although it is not really clear in the way he describes the symptoms.  A surgeon might be Dr. Coletta MemosKyle Cabbell in  neurosurgery.  He had tested does not emergency surgery but elective surgery but definitely major surgery.  He is 50.  He does not know he has COPD but does admit to chronic shortness of breath and cough that is of moderate intensity.  He is not on any inhalers.  He has not had any respiratory exacerbation in the last 1 month he is not on oxygen therapy.  He does consume alcohol daily.  He states he quit drinking but when I asked him when his last drink was it was earlier this morning.  He says he will develop tremors without alcohol.  In fact in the office he is having evidence of withdrawal tremor.  He says the morning drink will usually quite and his tremors and he can go the whole day without any alcohol.  He denies any drug abuse.  He is sexually active with one partner.  He does not think he will be HIV positive. - negative in 2017  Last labs in 2017 -had mild  anemia bu tnormal creat  Last CT June 2017 - persoinally visulaized - emphysema +, ? ILD +   [facility] Postperative Pulmonary Risk Score Comment Score  Type of surgery - abd ao aneurysm (27), thoracic (21), neurosurgery / upper abdominal / vascular (21), neck (11)    Emergency Surgery - (11) Elective surgery 0  ALbumin < 3 or poor nutritional state - (9)    BUN > 30 -  (8)    Partial or completely dependent functional status - (7) Walks with cane 7  COPD -  (6) Seen on ct June 20-17 6  Age - 2660 to 6569 (4), > 70  (6) Age 51 0  TOTAL    Risk Stratifcation scores  - < 10, 11-19, 20-27, 28-40, >40       CANET Postperative Pulmonary Risk Score Comment Score  Age - <50 (0), 50-80 (3), >80 (16) 50year 3  Preoperative pulse ox - >96 (0), 91-95 (8), <90 (24) 96% on RA at rest 0  Respiratory infection in last month - Yes (17) no 0  Preoperative anemia - < 10gm% - Yes (11)    Surgical incision - Upper abdominal (15),  Thoracic (24)    Duration of surgery - <2h (0), 2-3h (16), >3h (23)    Emergency Surgery - Yes (8) elective 0  TOTAL    Risk Stratification - Low (<26), Intermediate (26-44), High (>45)      Walking desaturation test on 08/07/2017 185 feet x 3 laps on ROOM AIR:  did not desaturate. Rest pulse ox was 98%, final pulse ox was 100%. HR response 82/min at rest to 120/min at peak exertion. Patient [name]  Did not Desaturate < 88% . [name] did not  Desaturated </= 3% points. [name] yes got get tachyardic    has a past medical history of Alcohol abuse, Allergy, Anxiety, Arthritis, COPD (chronic obstructive pulmonary disease) (HCC), Depression, Hypertension, and Shortness of breath.   reports that he has been smoking cigarettes.  He has a 70.00 pack-year smoking history. he has never used smokeless tobacco.  Past Surgical History:  Procedure Laterality Date  . BACK SURGERY    . HEMORRHOID SURGERY    . HERNIA REPAIR    . MANDIBLE FRACTURE SURGERY       No Known Allergies   There is no immunization history on file for this patient.  Family History  Problem Relation Age of Onset  . Hypertension Mother   . Hypertension Father   . Hypertension Brother      Current Outpatient Medications:  .  albuterol (PROVENTIL HFA;VENTOLIN HFA) 108 (90 Base) MCG/ACT inhaler, Inhale 2 puffs into the lungs every 4 (four) hours as needed for wheezing or shortness of breath., Disp: 1 Inhaler, Rfl: 0 .  Aspirin-Caffeine (BC FAST PAIN RELIEF PO), Take 1 packet by mouth every 6 (six) hours as needed (for headaches)., Disp: , Rfl:  .  oxyCODONE-acetaminophen (PERCOCET/ROXICET) 5-325 MG tablet, TK 1 T PO Q 6 H PRN P, Disp: , Rfl: 0   Review of Systems  Constitutional: Positive for unexpected weight change. Negative for fever.  HENT: Negative for congestion, dental problem, ear pain, nosebleeds, postnasal drip, rhinorrhea, sinus pressure, sneezing, sore throat and trouble swallowing.   Eyes: Negative for redness and itching.  Respiratory: Positive for cough, shortness of breath and wheezing. Negative for chest tightness.   Cardiovascular: Negative for palpitations and leg swelling.  Gastrointestinal: Negative for nausea and vomiting.  Genitourinary: Negative for dysuria.  Musculoskeletal: Negative for joint swelling.  Skin: Negative for rash.  Allergic/Immunologic: Negative.  Negative for environmental allergies, food allergies and immunocompromised state.  Neurological: Positive for headaches.  Hematological: Bruises/bleeds easily.  Psychiatric/Behavioral: Positive for dysphoric mood. The patient is not nervous/anxious.        Objective:   Physical Exam  Constitutional: He is oriented to person, place, and time. He appears well-nourished. No distress.  Chronically deconditioned  HENT:  Head: Normocephalic and atraumatic.  Right Ear: External ear normal.  Left Ear: External ear normal.  Mouth/Throat: Oropharynx is clear and moist. No  oropharyngeal exudate.  Poor dentition  Eyes: Conjunctivae and EOM are normal. Pupils are equal, round, and reactive to light. Right eye exhibits no discharge. Left eye exhibits no discharge. No scleral icterus.  Neck: Normal range of motion. Neck supple. No JVD present. No tracheal deviation present. No thyromegaly present.  Cardiovascular: Normal rate, regular rhythm and intact distal pulses. Exam reveals no gallop and no friction rub.  No murmur heard. Tachycardic  Pulmonary/Chest: Effort normal and breath sounds normal. No respiratory distress. He has no wheezes. He has no rales. He exhibits no tenderness.  ?  Crackles at base  Abdominal: Soft. Bowel sounds are normal. He exhibits no distension and no mass. There is no tenderness. There is no rebound and no guarding.  Musculoskeletal: Normal range of motion. He exhibits no edema or tenderness.  Walks with cane Mildly scoliotic spine in low back Has tremors  Lymphadenopathy:    He has no cervical adenopathy.  Neurological: He is alert and oriented to person, place, and time. He has normal reflexes. No cranial nerve deficit. Coordination normal.  Skin: Skin is warm and dry. No rash noted. He is not diaphoretic. No erythema. No pallor.  Psychiatric: He has a normal mood and affect. His behavior is normal. Judgment and thought content normal.  Nursing note and vitals reviewed.   Vitals:   08/07/17 1637  BP: 120/78  Pulse: 98  SpO2: 96%  Weight: 124 lb (56.2 kg)  Height: 5\' 6"  (1.676 m)    Estimated body mass index is 20.01 kg/m as calculated from the following:   Height as of this encounter: 5\' 6"  (1.676 m).   Weight as of this encounter: 124 lb (56.2 kg).       Assessment & Plan:     ICD-10-CM   1. Preop respiratory exam Z01.811 Hepatic function panel    CT Chest High Resolution    Basic Metabolic Panel (BMET)    Alpha-1 antitrypsin phenotype    CBC w/Diff    CANCELED: Pulmonary function test  2. Abnormal CT of the  chest R93.89 CT Chest High Resolution  3. History of pulmonary emphysema Z87.09 Hepatic function panel    CT Chest High Resolution    Basic Metabolic Panel (BMET)    Alpha-1 antitrypsin phenotype    CBC w/Diff    CANCELED: Pulmonary function test  4. Shortness of breath R06.02 Hepatic function panel    CT Chest High Resolution    Alpha-1 antitrypsin phenotype    CBC w/Diff    CANCELED: Pulmonary function test  5. Chronic cough R05 Hepatic function panel    Alpha-1 antitrypsin phenotype    CBC w/Diff    CANCELED: Pulmonary function test  6. History of smoking greater than 50 pack years Z87.891 Hepatic function panel    Alpha-1 antitrypsin phenotype    CBC w/Diff    CANCELED: Pulmonary function test  7. Alcohol use Z78.9 Hepatic function panel    CBC w/Diff     The first thing is in need to establish his nature of lung disease.  Need to check alpha-1 because he has emphysema at age 60 even though is African-American.  I also need to ensure that he does not have interstitial lung disease given some report of scarring seen in CT scan of the chest June 2017.  We will also need to know the severity of his emphysema therefore he will need pulmonary function test. Therefore he will have these test and then return for follow-up within the next 2 weeks.  Preoperative respiratory evaluation: We will need the data from his pulmonary evaluation and also his current status of anemia and nutritional status and renal function in order to further qualify and quantify risk.  In addition we will need to know what exact surgery he is having and how long this will take in order to further quantify risk.  As can be seen in the table above there is still missing data points in order to quantify his risk.  We will therefore get a CBC, liver function test and chemistry panel before deciding further  risk adjudication.   Alcohol abuse: He appears alcohol dependent.  He is having tremors in the office.  Clearly any  surgery even though he might end up being low-moderate risk for pulmonary complications but given the significant alcohol withdrawal he will definitely need to be an admission inpatient postoperatively on a alcohol taper protocol    Dr. Kalman ShanMurali Anie Juniel, M.D., [facility]C.P Pulmonary and Critical Care Medicine Staff Physician, [facility] Center Director - Interstitial Lung Disease  Program  Pulmonary Fibrosis [facility] [place], [place], [id number]  Pager: [phone], If no answer or between  15:00h - 7:00h: call [phone] Telephone: [phone]

## 2017-08-07 NOTE — Patient Instructions (Addendum)
ICD-10-CM   1. Preop respiratory exam Z01.811   2. Abnormal CT of the chest R93.89   3. History of pulmonary emphysema Z87.09   4. Shortness of breath R06.02   5. Chronic cough R05   6. History of smoking greater than 50 pack years Z87.891   7. Alcohol use Z78.9      PLAN Cbc, bmet, lft Check alpha 1 Check HRCT Check  Pre-bd spiro and dlco only. No lung volume or bd response.   followup  return to see APP Sarah/TAmmy aftter completing above next 1-2 weeks

## 2017-08-08 ENCOUNTER — Ambulatory Visit (INDEPENDENT_AMBULATORY_CARE_PROVIDER_SITE_OTHER)
Admission: RE | Admit: 2017-08-08 | Discharge: 2017-08-08 | Disposition: A | Discharge status: Home / Self Care | Payer: Medicare Other | Source: Ambulatory Visit | Attending: Internal Medicine | Admitting: Internal Medicine

## 2017-08-08 DIAGNOSIS — R9389 Abnormal findings on diagnostic imaging of other specified body structures: Secondary | ICD-10-CM | POA: Diagnosis not present

## 2017-08-08 DIAGNOSIS — R0602 Shortness of breath: Secondary | ICD-10-CM

## 2017-08-08 DIAGNOSIS — Z01811 Encounter for preprocedural respiratory examination: Secondary | ICD-10-CM

## 2017-08-08 DIAGNOSIS — Z8709 Personal history of other diseases of the respiratory system: Secondary | ICD-10-CM

## 2017-08-08 LAB — HEPATIC FUNCTION PANEL
ALT: 22 U/L (ref 0–53)
AST: 47 U/L — ABNORMAL HIGH (ref 0–37)
Albumin: 4.3 g/dL (ref 3.5–5.2)
Alkaline Phosphatase: 78 U/L (ref 39–117)
BILIRUBIN DIRECT: 0.1 mg/dL (ref 0.0–0.3)
BILIRUBIN TOTAL: 0.3 mg/dL (ref 0.2–1.2)
Total Protein: 8 g/dL (ref 6.0–8.3)

## 2017-08-08 LAB — BASIC METABOLIC PANEL
BUN: 5 mg/dL — ABNORMAL LOW (ref 6–23)
CHLORIDE: 99 meq/L (ref 96–112)
CO2: 27 mEq/L (ref 19–32)
CREATININE: 0.52 mg/dL (ref 0.40–1.50)
Calcium: 9.2 mg/dL (ref 8.4–10.5)
GFR: 214.63 mL/min (ref >60.00)
Glucose, Bld: 63 mg/dL — ABNORMAL LOW (ref 70–99)
Potassium: 3.6 mEq/L (ref 3.5–5.1)
Sodium: 136 mEq/L (ref 135–145)

## 2017-08-13 LAB — ALPHA-1 ANTITRYPSIN PHENOTYPE: A1 ANTITRYPSIN SER: 179 mg/dL (ref 83–199)

## 2017-08-17 ENCOUNTER — Telehealth: Payer: Self-pay | Admitting: Hematology

## 2017-08-17 NOTE — Telephone Encounter (Signed)
Left message with Talbert ForestShirley regarding appointment D/T/Loc/Phone#

## 2017-08-29 ENCOUNTER — Telehealth: Payer: Self-pay | Admitting: Hematology

## 2017-08-29 NOTE — Telephone Encounter (Signed)
Spoke with patient regarding upcoming appointments and he confirmed

## 2017-08-31 ENCOUNTER — Ambulatory Visit (INDEPENDENT_AMBULATORY_CARE_PROVIDER_SITE_OTHER): Payer: Medicare Other | Admitting: Internal Medicine

## 2017-08-31 ENCOUNTER — Encounter: Payer: Self-pay | Admitting: Adult Health

## 2017-08-31 ENCOUNTER — Ambulatory Visit: Payer: Medicare Other | Admitting: Adult Health

## 2017-08-31 DIAGNOSIS — J439 Emphysema, unspecified: Secondary | ICD-10-CM

## 2017-08-31 DIAGNOSIS — Z01818 Encounter for other preprocedural examination: Secondary | ICD-10-CM

## 2017-08-31 DIAGNOSIS — F1010 Alcohol abuse, uncomplicated: Secondary | ICD-10-CM

## 2017-08-31 DIAGNOSIS — Z01811 Encounter for preprocedural respiratory examination: Secondary | ICD-10-CM

## 2017-08-31 LAB — PULMONARY FUNCTION TEST
DL/VA % pred: 85 %
DL/VA: 3.73 ml/min/mmHg/L
DLCO UNC % PRED: 78 %
DLCO cor % pred: 82 %
DLCO cor: 22.24 ml/min/mmHg
DLCO unc: 21.23 ml/min/mmHg
FEF 25-75 PRE: 1.52 L/s
FEF2575-%PRED-PRE: 52 %
FEV1-%PRED-PRE: 99 %
FEV1-PRE: 2.85 L
FEV1FVC-%PRED-PRE: 81 %
FEV6-%PRED-PRE: 123 %
FEV6-PRE: 4.29 L
FEV6FVC-%PRED-PRE: 101 %
FVC-%Pred-Pre: 122 %
FVC-Pre: 4.38 L
PRE FEV1/FVC RATIO: 65 %
Pre FEV6/FVC Ratio: 98 %

## 2017-08-31 MED ORDER — ALBUTEROL SULFATE HFA 108 (90 BASE) MCG/ACT IN AERS: 2.0000 | INHALATION_SPRAY | RESPIRATORY_TRACT | 3 refills | Status: DC | PRN

## 2017-08-31 NOTE — Progress Notes (Signed)
PFT before and DLCO only today 08/31/17

## 2017-08-31 NOTE — Patient Instructions (Addendum)
You have Moderate Emphysema .  Most important goal is to quit smoking .  May use Proventil 2 puffs every 4hr as needed for wheezing .  Absolutely no smoking 1 week prior to surgery .  If they proceed with surgery will need admission inpatient postoperatively care on a alcohol taper protocol Follow up in Dr. Marchelle Gearingamaswamy in 6 months and As needed

## 2017-08-31 NOTE — Progress Notes (Signed)
@Patient  ID: [name], male    DOB: 10-Oct-1965, 52 y.o.   MRN: [id number]  Chief Complaint  Patient presents with  . Follow-up    COPD     Referring provider: No ref. provider found  HPI: 52 year old male, active smoker, seen for pulmonary consult for preop clearance for possible COPD .  ETOH abuse   08/31/2017 Follow up: COPD , Pre-Op clearance  Patient presents for a one-month follow-up.  Patient was seen last visit for a preop clearance.  Patient has presumed COPD with underlying smoking history.  He has an upcoming Back surgery for degenerative changes of the LS spine.  Unfortunately I do not have any surgical office notes.  Patient was set up for PFTs done today that showed minimum COPD with an FEV1 at 99%, ratio 65, FVC 122%, DLCO 78%.  Patient smokes on average 1 pack/day.. Lab work showed alpha-1 testing with normal level.  Phenotype was considered a normal variant with a MZ Pratt.  High resolution CT chest showed no interstitial lung disease.  There was some mild focal fibrosis along the right lung base.  With moderate emphysema. Patient uses Proventil as needed.  He says he rarely uses this.  He denies any significant shortness of breath cough or congestion.. Patient says he is limited due to back pain.  Patient does have an alcohol Dependence problem.  He says he is tried to quit drinking many times in the past unsuccessfully.  He drinks on average 3 40 ounces of beer a day . CBC showed normal platelets.  Hepatic showed minimally elevated LFTs. We discussed potential pulmonary surgical complications and risk.  Patient has minimum  Airflow obstruction with mild COPD and moderate emphysema on CT chest.  Have advised him that his smoking does increase his potential postop complications.  Positive   No Known Allergies   There is no immunization history on file for this patient.  Past Medical History:  Diagnosis Date  . Alcohol abuse   . Allergy   . Anxiety   .  Arthritis   . COPD (chronic obstructive pulmonary disease) (HCC)   . Depression   . Hypertension   . Shortness of breath     Tobacco History: Social History   Tobacco Use  Smoking Status Current Every Day Smoker  . Packs/day: 2.00  . Years: 35.00  . Pack years: 70.00  . Types: Cigarettes  Smokeless Tobacco Never Used  Tobacco Comment   currently smoking 1ppd as of 08/07/17 ep   Ready to quit: No Counseling given: Yes Comment: currently smoking 1ppd as of 08/07/17 ep   Outpatient Encounter Medications as of 08/31/2017  Medication Sig  . albuterol (PROVENTIL HFA;VENTOLIN HFA) 108 (90 Base) MCG/ACT inhaler Inhale 2 puffs into the lungs every 4 (four) hours as needed for wheezing or shortness of breath.  . Aspirin-Caffeine (BC FAST PAIN RELIEF PO) Take 1 packet by mouth every 6 (six) hours as needed (for headaches).  . oxyCODONE-acetaminophen (PERCOCET/ROXICET) 5-325 MG tablet TK 1 T PO Q 6 H PRN P  . [DISCONTINUED] albuterol (PROVENTIL HFA;VENTOLIN HFA) 108 (90 Base) MCG/ACT inhaler Inhale 2 puffs into the lungs every 4 (four) hours as needed for wheezing or shortness of breath.   No facility-administered encounter medications on file as of 08/31/2017.      Review of Systems  Constitutional:   No  weight loss, night sweats,  Fevers, chills, fatigue, or  lassitude.  HEENT:   No headaches,  Difficulty swallowing,  Tooth/dental problems, or  Sore throat,                No sneezing, itching, ear ache, nasal congestion, post nasal drip,   CV:  No chest pain,  Orthopnea, PND, swelling in lower extremities, anasarca, dizziness, palpitations, syncope.   GI  No heartburn, indigestion, abdominal pain, nausea, vomiting, diarrhea, change in bowel habits, loss of appetite, bloody stools.   Resp: No shortness of breath with exertion or at rest.  No excess mucus, no productive cough,  No non-productive cough,  No coughing up of blood.  No change in color of mucus.  No wheezing.  No chest  wall deformity  Skin: no rash or lesions.  GU: no dysuria, change in color of urine, no urgency or frequency.  No flank pain, no hematuria   MS:  No joint pain or swelling.  No decreased range of motion.   back pain.    Physical Exam  BP 126/74 (BP Location: Right Arm, Cuff Size: Normal)   Pulse 95   Ht 5\' 6"  (1.676 m)   Wt 121 lb 12.8 oz (55.2 kg)   SpO2 94%   BMI 19.66 kg/m    GEN: A/Ox3; pleasant , NAD, appears older than stated age, uses a cane to walk  HEENT:  [place]/AT,  EACs-clear, TMs-wnl, NOSE-clear, THROAT-clear, no lesions, no postnasal drip or exudate noted.  Poor dentition  NECK:  Supple w/ fair ROM; no JVD; normal carotid impulses w/o bruits; no thyromegaly or nodules palpated; no lymphadenopathy.    RESP  Clear  P & A; w/o, wheezes/ rales/ or rhonchi. no accessory muscle use, no dullness to percussion  CARD:  RRR, no m/r/g, no peripheral edema, pulses intact, no cyanosis or clubbing.  GI:   Soft & nt; nml bowel sounds; no organomegaly or masses detected.   Musco: Warm bil, no deformities or joint swelling noted.   Neuro: alert, no focal deficits noted.    Skin: Warm, no lesions or rashes    Lab Results:  CBC    Component Value Date/Time   WBC 6.5 08/07/2017 1716   RBC 3.61 (L) 08/07/2017 1716   HGB 13.0 08/07/2017 1716   HCT 38.3 (L) 08/07/2017 1716   PLT 268.0 08/07/2017 1716   MCV 105.9 (H) 08/07/2017 1716   MCV 102.9 (A) 11/17/2015 1252   MCH 33.9 02/05/2016 2016   MCHC 34.0 08/07/2017 1716   RDW 13.2 08/07/2017 1716   LYMPHSABS 1.3 08/07/2017 1716   MONOABS 0.8 08/07/2017 1716   EOSABS 0.0 08/07/2017 1716   BASOSABS 0.1 08/07/2017 1716    BMET    Component Value Date/Time   NA 136 08/07/2017 1716   K 3.6 08/07/2017 1716   CL 99 08/07/2017 1716   CO2 27 08/07/2017 1716   GLUCOSE 63 (L) 08/07/2017 1716   BUN 5 (L) 08/07/2017 1716   CREATININE 0.52 08/07/2017 1716   CREATININE 0.46 (L) 11/17/2015 1241   CALCIUM 9.2 08/07/2017 1716     GFRNONAA >60 02/05/2016 2016   GFRAA >60 02/05/2016 2016    BNP No results found for: BNP  ProBNP No results found for: PROBNP  Imaging: Ct Chest High Resolution  Result Date: 08/08/2017 CLINICAL DATA:  Dyspnea.  Emphysema.  Preoperative examination. EXAM: CT CHEST WITHOUT CONTRAST TECHNIQUE: Multidetector CT imaging of the chest was performed following the standard protocol without intravenous contrast. High resolution imaging of the lungs, as well as inspiratory and expiratory imaging, was performed. COMPARISON:  02/05/2016 chest  CT. FINDINGS: Cardiovascular: Normal heart size. No significant pericardial fluid/thickening. Mildly atherosclerotic nonaneurysmal thoracic aorta. Normal caliber pulmonary arteries. Mediastinum/Nodes: No discrete thyroid nodules. Unremarkable esophagus. No pathologically enlarged axillary, mediastinal or gross hilar lymph nodes, noting limited sensitivity for the detection of hilar adenopathy on this noncontrast study. Lungs/Pleura: No pneumothorax. No pleural effusion. No acute consolidative airspace disease or lung masses. Medial apical right upper lobe irregular 0.5 cm pulmonary nodule (series 3/ image 24), stable since 06/08/2014 chest CT, considered benign. No new significant pulmonary nodules. No significant lobular air trapping on the expiration sequence. Moderate centrilobular and mild paraseptal emphysema with diffuse bronchial wall thickening. Mild focal subpleural reticulation in the basilar right middle lobe is not convincingly changed since 06/08/2014 chest CT, compatible with nonspecific postinfectious/postinflammatory scarring. Otherwise no significant regions of subpleural reticulation, ground-glass attenuation, traction bronchiectasis, architectural distortion or frank honeycombing. Upper abdomen: No acute abnormality. Musculoskeletal: No aggressive appearing focal osseous lesions. Mild thoracic spondylosis. IMPRESSION: 1. No convincing findings of  interstitial lung disease. Mild focal fibrosis at the anterior right lung base, not appreciably changed since 2015, most compatible with nonspecific mild postinfectious/postinflammatory scarring. 2. Moderate centrilobular and mild paraseptal emphysema with diffuse bronchial wall thickening, suggesting COPD. Aortic Atherosclerosis (ICD10-I70.0) and Emphysema (ICD10-J43.9). Electronically Signed   By: Delbert PhenixJason A Poff M.D.   On: 08/08/2017 14:29     Assessment & Plan:   COPD (chronic obstructive pulmonary disease) (HCC) Mild COPD with moderate emphysema on CT chest.  Patient is advised on smoking cessation.  May use Proventil as needed  Plan Patient Instructions  You have Moderate Emphysema .  Most important goal is to quit smoking .  May use Proventil 2 puffs every 4hr as needed for wheezing .  Absolutely no smoking 1 week prior to surgery .  If they proceed with surgery will need admission inpatient postoperatively care on a alcohol taper protocol Follow up in Dr. Marchelle Gearingamaswamy in 6 months and As needed       Alcohol abuse, continuous Advised on need for detox program. If  proceed with surgery will need inpatient surgery with close monitoring of potential alcohol withdrawal  Preoperative clearance Pulmonary preoperative clearance from a pulmonary perspective patient has minimum COPD despite Significant smoking history.  He does have moderate emphysema.  This along with ongoing smoking places him at a moderate of surgical risk.  Along with his ongoing alcohol abuse patient would most Definitely need inpatient strict monitoring for surgery with alcohol withdrawal syndrome      Major Pulmonary risks identified in the multifactorial risk analysis are but not limited to a) pneumonia; b) recurrent intubation risk; c) prolonged or recurrent acute respiratory failure needing mechanical ventilation; d) prolonged hospitalization; e) DVT/Pulmonary embolism; f) Acute Pulmonary edema  Advised on  smoking cessation :  1. Short duration of surgery as much as possible and avoid paralytic if possible 2. Recovery in step down or ICU with Pulmonary consultation 3. DVT prophylaxis 4. Aggressive pulmonary toilet with o2, bronchodilatation, and incentive spirometry and early ambulation 5. Smoking cessation 1 week prior to surgery .         Rubye Oaksammy Markisha Meding, NP 08/31/2017

## 2017-08-31 NOTE — Assessment & Plan Note (Signed)
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  Patient is advised on smoking cessation.  May use Proventil as needed  Plan Patient Instructions  You have Moderate Emphysema .  Most important goal is to quit smoking .  May use Proventil 2 puffs every 4hr as needed for wheezing .  Absolutely no smoking 1 week prior to surgery .  If they proceed with surgery will need admission inpatient postoperatively care on a alcohol taper protocol Follow up in Dr. Marchelle Gearingamaswamy in 6 months and As needed

## 2017-08-31 NOTE — Assessment & Plan Note (Signed)
Advised on need for detox program. If  proceed with surgery will need inpatient surgery with close monitoring of potential alcohol withdrawal

## 2017-08-31 NOTE — Assessment & Plan Note (Signed)
Pulmonary preoperative clearance from a pulmonary perspective patient has minimum COPD despite Significant smoking history.  He does have moderate emphysema.  This along with ongoing smoking places him at a moderate of surgical risk.  Along with his ongoing alcohol abuse patient would most Definitely need inpatient strict monitoring for surgery with alcohol withdrawal syndrome      Major Pulmonary risks identified in the multifactorial risk analysis are but not limited to a) pneumonia; b) recurrent intubation risk; c) prolonged or recurrent acute respiratory failure needing mechanical ventilation; d) prolonged hospitalization; e) DVT/Pulmonary embolism; f) Acute Pulmonary edema  Advised on smoking cessation :  1. Short duration of surgery as much as possible and avoid paralytic if possible 2. Recovery in step down or ICU with Pulmonary consultation 3. DVT prophylaxis 4. Aggressive pulmonary toilet with o2, bronchodilatation, and incentive spirometry and early ambulation 5. Smoking cessation 1 week prior to surgery .

## 2017-09-14 ENCOUNTER — Telehealth: Payer: Self-pay | Admitting: Adult Health

## 2017-09-14 NOTE — Telephone Encounter (Signed)
Pt seen by TP on 1.11.19 for pre-op clearance and PFT review: Patient Instructions  You have Moderate Emphysema .  Most important goal is to quit smoking .  May use Proventil 2 puffs every 4hr as needed for wheezing .  Absolutely no smoking 1 week prior to surgery .  If they proceed with surgery will need admission inpatient postoperatively care on a alcohol taper protocol Follow up in Dr. Marchelle Gearingamaswamy in 6 months and As needed     Nothing further pending in our system Called spoke with patient who reported he received a call from his "cardiologist" at [facility] stating that he needs further testing.  Patient unable to provide any additional information including phone number or contact person.  Patient confused about whether or not he was calling pulmonary or oncology.  Asked pt to have that office call us so that we can discuss this, he will do so Will sign off and await call back

## 2017-10-01 ENCOUNTER — Telehealth: Payer: Self-pay | Admitting: Hematology

## 2017-10-01 ENCOUNTER — Telehealth: Payer: Self-pay | Admitting: *Deleted

## 2017-10-01 ENCOUNTER — Encounter: Payer: Self-pay | Admitting: Hematology

## 2017-10-01 NOTE — Telephone Encounter (Signed)
Patient called in to rescedule

## 2017-10-01 NOTE — Telephone Encounter (Signed)
"  Need to reschedule appointment.  My sister [name] was to bring me this morning.  She had an appointment as well so she could not bring me.  I'll check with her and call back to reschedule when I know what days or times she can bring me."

## 2017-10-25 NOTE — Progress Notes (Addendum)
HEMATOLOGY/ONCOLOGY CONSULTATION NOTE  Date of Service: 10/29/2017  Patient Care Team: Patient, No Pcp Per as PCP - General (General Practice)  CHIEF COMPLAINTS/PURPOSE OF CONSULTATION:  Macrocytosis without significant anemia  HISTORY OF PRESENTING ILLNESS:   [name] is a wonderful 52 y.o. male who has been referred to [place] by Dr Greggory Stallion Osei-Bonsu for evaluation and management of macrocytic anemia. He is accompanied today by his sister. The pt reports that he is doing well overall.  The pt denies any liver problems.   The pt notes that he drinks 160 oz of beer each day. He notes that he does not eat very well and does not take any vitamins.  He takes BC powder for his back pain.  He reports a history of hemorrhoids that were surgically removed.  He notes that he has been to rehab for ETOH several times and is not currently seeing an addiction specialist. He notes that he gets withdrawal symptoms and bad shakes when he does not consume ETOH.  He reports last using crack about 6-7 years ago.   Most recent lab results (08/07/17) of CBC  is as follows: all values are WNL except for RBC at 3.61, HCT at 38.3, MCV at 105.9 with HGB of 13  On review of systems, pt reports gaining some weight, a hernia and denies abdominal pains, and any other symptoms.   On PMHx the pt reports surgically resected hemorrhoids. On Social Hx the pt reports 160 oz of ETOH consumption each day. He notes last using crack 6-7 years ago.  On Family Hx the pt reports alcoholism.   MEDICAL HISTORY:  Past Medical History:  Diagnosis Date   Alcohol abuse    Allergy    Anxiety    Arthritis    COPD (chronic obstructive pulmonary disease) (HCC)    Depression    Hypertension    Shortness of breath     SURGICAL HISTORY: Past Surgical History:  Procedure Laterality Date   BACK SURGERY     HEMORRHOID SURGERY     HERNIA REPAIR     MANDIBLE FRACTURE SURGERY      SOCIAL HISTORY: Social  History   Socioeconomic History   Marital status: Legally Separated    Spouse name: Not on file   Number of children: Not on file   Years of education: Not on file   Highest education level: Not on file  Social Needs   Financial resource strain: Not on file   Food insecurity - worry: Not on file   Food insecurity - inability: Not on file   Transportation needs - medical: Not on file   Transportation needs - non-medical: Not on file  Occupational History   Not on file  Tobacco Use   Smoking status: Current Every Day Smoker    Packs/day: 1.00    Years: 35.00    Pack years: 35.00    Types: Cigarettes   Smokeless tobacco: Never Used   Tobacco comment: currently smoking 1ppd as of 08/07/17 ep  Substance and Sexual Activity   Alcohol use: Yes    Alcohol/week: 12.0 oz    Types: 20 Cans of beer per week    Comment: 3-4 beers daily   Drug use: No   Sexual activity: Yes    Birth control/protection: None  Other Topics Concern   Not on file  Social History Narrative   Not on file    FAMILY HISTORY: Family History  Problem Relation Age of Onset  Hypertension Mother    Hypertension Father    Hypertension Brother     ALLERGIES:  has No Known Allergies.  MEDICATIONS:  Current Outpatient Medications  Medication Sig Dispense Refill   albuterol (PROVENTIL HFA;VENTOLIN HFA) 108 (90 Base) MCG/ACT inhaler Inhale 2 puffs into the lungs every 4 (four) hours as needed for wheezing or shortness of breath. 1 Inhaler 3   Aspirin-Caffeine (BC FAST PAIN RELIEF PO) Take 1 packet by mouth every 6 (six) hours as needed (for headaches).     No current facility-administered medications for this visit.     REVIEW OF SYSTEMS:    10 Point review of Systems was done is negative except as noted above.  PHYSICAL EXAMINATION:  . Vitals:   10/29/17 0952  BP: (!) 154/91  Pulse: 84  Resp: 20  Temp: 98.2 F (36.8 C)  SpO2: 96%   Filed Weights   10/29/17 0952    Weight: 130 lb 14.4 oz (59.4 kg)   .Body mass index is 21.13 kg/m.  GENERAL:alert, in no acute distress and comfortable SKIN: no acute rashes, no significant lesions EYES: conjunctiva are pink and non-injected, sclera anicteric OROPHARYNX: MMM, no exudates, no oropharyngeal erythema or ulceration NECK: supple, no JVD LYMPH:  no palpable lymphadenopathy in the cervical, axillary or inguinal regions LUNGS: clear to auscultation b/l with normal respiratory effort HEART: regular rate & rhythm ABDOMEN:  normoactive bowel sounds , non tender, not distended. Extremity: no pedal edema PSYCH: alert & oriented x 3 with fluent speech NEURO: no focal motor/sensory deficits  LABORATORY DATA:  I have reviewed the data as listed  . CBC Latest Ref Rng & Units 08/07/2017 02/05/2016 11/28/2015  WBC 4.0 - 10.5 K/uL 6.5 3.4(L) 5.7  Hemoglobin 13.0 - 17.0 g/dL 66.413.0 12.0(L) 11.0(L)  Hematocrit 39.0 - 52.0 % 38.3(L) 34.4(L) 32.0(L)  Platelets 150.0 - 400.0 K/uL 268.0 132(L) 156   . CBC    Component Value Date/Time   WBC 6.5 08/07/2017 1716   RBC 3.61 (L) 08/07/2017 1716   HGB 13.0 08/07/2017 1716   HCT 38.3 (L) 08/07/2017 1716   PLT 268.0 08/07/2017 1716   MCV 105.9 (H) 08/07/2017 1716   MCV 102.9 (A) 11/17/2015 1252   MCH 33.9 02/05/2016 2016   MCHC 34.0 08/07/2017 1716   RDW 13.2 08/07/2017 1716   LYMPHSABS 1.3 08/07/2017 1716   MONOABS 0.8 08/07/2017 1716   EOSABS 0.0 08/07/2017 1716   BASOSABS 0.1 08/07/2017 1716     CMP Latest Ref Rng & Units 08/07/2017 02/05/2016 11/28/2015  Glucose 70 - 99 mg/dL 40(H63(L) 86 98  BUN 6 - 23 mg/dL 5(L) 5(L) 10  Creatinine 0.40 - 1.50 mg/dL 4.740.52 2.59(D0.58(L) 6.38(V0.58(L)  Sodium 135 - 145 mEq/L 136 138 139  Potassium 3.5 - 5.1 mEq/L 3.6 4.1 2.7(LL)  Chloride 96 - 112 mEq/L 99 101 98(L)  CO2 19 - 32 mEq/L 27 25 27   Calcium 8.4 - 10.5 mg/dL 9.2 5.6(E8.6(L) 9.5  Total Protein 6.0 - 8.3 g/dL 8.0 - 7.4  Total Bilirubin 0.2 - 1.2 mg/dL 0.3 - 0.6  Alkaline Phos 39 - 117  U/L 78 - 54  AST 0 - 37 U/L 47(H) - 53(H)  ALT 0 - 53 U/L 22 - 26     RADIOGRAPHIC STUDIES: I have personally reviewed the radiological images as listed and agreed with the findings in the report. No results found.  ASSESSMENT & PLAN:   52 y.o. male with  1. 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 -Spent much time counseling the pt toward ETOH cessation.  -Could also be vitamin deficiency related (folate/B12/thiamine etc) -We recommended using daily B-complex vitamin OTC.  -Para-umbilical hernia noted. No palpable splenomegaly. Cannot r/o liver disease from ETOH -Encouraged following up with PCP about ETOH consumption and getting some help with this.  -Offered a blood testing today to workup his cause of RBC macrocytosis, which the pt noted he would not like at this time.   RTC with Dr Candise Che as needed  All of the patients questions were answered with apparent satisfaction. The patient knows to call the clinic with any problems, questions or concerns.  I spent 30 minutes counseling the patient face to face. The total time spent in the appointment was 35 minutes and more than 50% was on counseling and direct patient cares.    Wyvonnia Lora MD MS AAHIVMS [facility] Hematology/Oncology Physician [facility]  (Office):       [phone] (Work cell):  [phone] (Fax):           [phone]  10/29/2017 10:12 AM  This document serves as a record of services personally performed by Wyvonnia Lora, MD. It was created on his behalf by Marcelline Mates, a trained medical scribe. The creation of this record is based on the scribe's personal observations and the provider's statements to them.   .I have reviewed the above  documentation for accuracy and completeness, and I agree with the above. Johney Maine MD MS

## 2017-10-29 ENCOUNTER — Inpatient Hospital Stay: Payer: Medicare Other | Attending: Hematology | Admitting: Hematology

## 2017-10-29 ENCOUNTER — Encounter: Payer: Self-pay | Admitting: Hematology

## 2017-10-29 VITALS — BP 154/91 | HR 84 | Temp 98.2°F | Resp 20 | Ht 66.0 in | Wt 130.9 lb

## 2017-10-29 DIAGNOSIS — F1020 Alcohol dependence, uncomplicated: Secondary | ICD-10-CM | POA: Diagnosis not present

## 2017-10-29 DIAGNOSIS — D7589 Other specified diseases of blood and blood-forming organs: Secondary | ICD-10-CM | POA: Insufficient documentation

## 2017-10-29 DIAGNOSIS — F17210 Nicotine dependence, cigarettes, uncomplicated: Secondary | ICD-10-CM

## 2017-10-30 ENCOUNTER — Telehealth: Payer: Self-pay

## 2017-10-30 NOTE — Telephone Encounter (Signed)
RTC with Dr Candise CheKale as needed. Per 3/11 los

## 2017-12-11 NOTE — Telephone Encounter (Signed)
Error opening  

## 2018-04-25 ENCOUNTER — Ambulatory Visit: Payer: Self-pay | Admitting: Internal Medicine

## 2018-07-09 DIAGNOSIS — M545 Low back pain: Secondary | ICD-10-CM | POA: Diagnosis not present

## 2018-07-09 DIAGNOSIS — M5416 Radiculopathy, lumbar region: Secondary | ICD-10-CM | POA: Diagnosis not present

## 2018-07-30 DIAGNOSIS — R202 Paresthesia of skin: Secondary | ICD-10-CM | POA: Diagnosis not present

## 2018-07-30 DIAGNOSIS — Z720 Tobacco use: Secondary | ICD-10-CM | POA: Diagnosis not present

## 2018-07-30 DIAGNOSIS — E538 Deficiency of other specified B group vitamins: Secondary | ICD-10-CM | POA: Diagnosis not present

## 2018-07-30 DIAGNOSIS — E782 Mixed hyperlipidemia: Secondary | ICD-10-CM | POA: Diagnosis not present

## 2018-09-03 DIAGNOSIS — M5416 Radiculopathy, lumbar region: Secondary | ICD-10-CM | POA: Diagnosis not present

## 2018-10-07 DIAGNOSIS — M5416 Radiculopathy, lumbar region: Secondary | ICD-10-CM | POA: Diagnosis not present

## 2018-10-07 DIAGNOSIS — M545 Low back pain: Secondary | ICD-10-CM | POA: Diagnosis not present

## 2018-10-07 DIAGNOSIS — R030 Elevated blood-pressure reading, without diagnosis of hypertension: Secondary | ICD-10-CM | POA: Diagnosis not present

## 2018-10-08 DIAGNOSIS — E538 Deficiency of other specified B group vitamins: Secondary | ICD-10-CM | POA: Diagnosis not present

## 2018-10-08 DIAGNOSIS — J449 Chronic obstructive pulmonary disease, unspecified: Secondary | ICD-10-CM | POA: Diagnosis not present

## 2018-10-08 DIAGNOSIS — Z720 Tobacco use: Secondary | ICD-10-CM | POA: Diagnosis not present

## 2018-10-08 DIAGNOSIS — R0602 Shortness of breath: Secondary | ICD-10-CM | POA: Diagnosis not present

## 2018-10-08 DIAGNOSIS — Z7689 Persons encountering health services in other specified circumstances: Secondary | ICD-10-CM | POA: Diagnosis not present

## 2018-10-08 DIAGNOSIS — E782 Mixed hyperlipidemia: Secondary | ICD-10-CM | POA: Diagnosis not present

## 2018-11-08 DIAGNOSIS — R0602 Shortness of breath: Secondary | ICD-10-CM | POA: Diagnosis not present

## 2018-11-19 DIAGNOSIS — M5416 Radiculopathy, lumbar region: Secondary | ICD-10-CM | POA: Diagnosis not present

## 2019-01-01 DIAGNOSIS — M4150 Other secondary scoliosis, site unspecified: Secondary | ICD-10-CM | POA: Diagnosis not present

## 2019-01-08 DIAGNOSIS — Z720 Tobacco use: Secondary | ICD-10-CM | POA: Diagnosis not present

## 2019-01-08 DIAGNOSIS — E782 Mixed hyperlipidemia: Secondary | ICD-10-CM | POA: Diagnosis not present

## 2019-01-08 DIAGNOSIS — E538 Deficiency of other specified B group vitamins: Secondary | ICD-10-CM | POA: Diagnosis not present

## 2019-01-08 DIAGNOSIS — J449 Chronic obstructive pulmonary disease, unspecified: Secondary | ICD-10-CM | POA: Diagnosis not present

## 2019-01-31 DIAGNOSIS — E538 Deficiency of other specified B group vitamins: Secondary | ICD-10-CM | POA: Diagnosis not present

## 2019-01-31 DIAGNOSIS — M7581 Other shoulder lesions, right shoulder: Secondary | ICD-10-CM | POA: Diagnosis not present

## 2019-01-31 DIAGNOSIS — J449 Chronic obstructive pulmonary disease, unspecified: Secondary | ICD-10-CM | POA: Diagnosis not present

## 2019-01-31 DIAGNOSIS — E782 Mixed hyperlipidemia: Secondary | ICD-10-CM | POA: Diagnosis not present

## 2019-02-10 DIAGNOSIS — M4150 Other secondary scoliosis, site unspecified: Secondary | ICD-10-CM | POA: Diagnosis not present

## 2019-02-10 DIAGNOSIS — M5416 Radiculopathy, lumbar region: Secondary | ICD-10-CM | POA: Diagnosis not present

## 2019-02-26 DIAGNOSIS — M7581 Other shoulder lesions, right shoulder: Secondary | ICD-10-CM | POA: Diagnosis not present

## 2019-02-26 DIAGNOSIS — E782 Mixed hyperlipidemia: Secondary | ICD-10-CM | POA: Diagnosis not present

## 2019-02-26 DIAGNOSIS — E538 Deficiency of other specified B group vitamins: Secondary | ICD-10-CM | POA: Diagnosis not present

## 2019-02-26 DIAGNOSIS — K429 Umbilical hernia without obstruction or gangrene: Secondary | ICD-10-CM | POA: Diagnosis not present

## 2019-03-10 DIAGNOSIS — K439 Ventral hernia without obstruction or gangrene: Secondary | ICD-10-CM | POA: Diagnosis not present

## 2019-03-18 ENCOUNTER — Other Ambulatory Visit: Payer: Self-pay

## 2019-03-18 NOTE — Patient Outreach (Signed)
[facility]) Care Management  03/18/2019  [name] Dec 20, 1965 [id number]   Referral Date: 03/18/2019 Referral Source: [facility] referral Referral Reason: Medication and transportation assistance   Outreach Attempt: spoke with patient.  He is able to verify HIPAA.  Discussed reason for referral.  He states that he is unable to afford his back relaxer medication right now. He states that he will not be able to get it until he gets his check on 03/24/2019.  He also states he is unable to afford his inhalers. He states that they cost $120-$130.  He also states he needs help with transportation.  He states that he sometimes has to reschedule his appointments because he has no ride.  He states he had medicaid for which he was able to get rides with but he states that has expired and needs help with it.  Discussed [facility] services.  Patient agreeable to pharmacy and social work for assistance.     Social: Patient lives in the home with his sister.  He states that he is able to get around and take care of himself.     Conditions: Patient admits to asthma/COPD for which he uses his inhalers.  He also discussed back problems for which he receives injections.  He states surgery was recommended but he says that the doctor wants him to stop smoking and drinking.  He states he is not willing to give up his pleasures.  So for now he receives injections from Dr. Luan Pulling.     Medications: Patient does not take medications as prescribed as he sometimes cannot get his medications refilled due to not having any money.     Appointments:  Patient saw PCP last month.    Advanced Directives:  Patient does not have an advanced directive.     Plan: RN CM will refer to social work transportation and [employer] assistance.   RN CM will refer to pharmacy for medication assistance.   Jone Baseman, RN, MSN [facility] Care Management Care Management Coordinator Direct Line [phone] Toll Free:  [phone]  Fax: [phone]

## 2019-03-19 ENCOUNTER — Other Ambulatory Visit: Payer: Self-pay

## 2019-03-19 DIAGNOSIS — M4150 Other secondary scoliosis, site unspecified: Secondary | ICD-10-CM | POA: Diagnosis not present

## 2019-03-19 DIAGNOSIS — M9983 Other biomechanical lesions of lumbar region: Secondary | ICD-10-CM | POA: Diagnosis not present

## 2019-03-19 DIAGNOSIS — R030 Elevated blood-pressure reading, without diagnosis of hypertension: Secondary | ICD-10-CM | POA: Diagnosis not present

## 2019-03-19 DIAGNOSIS — M4316 Spondylolisthesis, lumbar region: Secondary | ICD-10-CM | POA: Diagnosis not present

## 2019-03-19 NOTE — Patient Outreach (Addendum)
[facility]) Care Management  03/19/2019  [name] [birth year] [id number]   Social work referral received from [employer], [employer].  "Please refer patient to social work for transportation assistance and assist with medicaid. Patient states he did have medicaid before but it expired" Successful outreach to patient today.  Patient is unsure of when Medicaid expired and has not been able to get in touch with his caseworker.  BSW encouraged patient to submit new application if he is certain that his coverage has expired.  Will mail application.  Patient is aware of transportation benefit through [employer] but states that rides have been exhausted.  BSW and patient discussed SCAT services.  BSW explained that service is for individuals with a cognitive or physical disability that prevents them from driving or using the public bus system.  Patient stated that there is a bus stop near his home and he is able to mobilize to it.  Per patient, the city is limiting number of passengers at this time due to [place] so there have been times that he was unable to ride due to the bus being at capacity.  BSW will contact Courtney with SCAT eligibility about his particular situation to determine if application should be submitted.  BSW will follow up with patient when response is received and complete application if deemed appropriate.   Addendum: BSW talked with Courtney at [place].  Unfortunately, patient is not eligible for services because he is able to use the public bus system.   BSW encouraged patient to complete/submit Medicaid application when received so that he can utilize transportation benefits if approved.  Patient stated that sister or neighbor should be able to assist with completion.  Will follow up within the next two weeks to ensure receipt of application.    Ronn Melena, BSW Social Worker [phone]

## 2019-03-20 ENCOUNTER — Other Ambulatory Visit: Payer: Self-pay | Admitting: Pharmacist

## 2019-03-20 NOTE — Patient Outreach (Signed)
[facility]) Care Management  [place]   03/20/2019  [name] 31-Mar-1966 [id number]  Reason for referral: Medication Assistance  Referral source: [facility] RN Current insurance: [facility]  PMHx includes but not limited to:  Asthma / COPD, ETOH and tobacco abuse, anemia, anxiety / depression, arthritis, HTN  Outreach:  Successful telephone call with patient.  HIPAA identifiers verified.   Subjective:  Patient reports that he does not have any issues paying for his medication but has trouble with transportation to the pharmacy.  He would like to have his medications be delivered through his insurance mail order pharmacy, [facility].  I reviewed that he needs to request any new prescriptions in the future to be sent to [facility].  Patient able to write down the name of [facility] and repeat it back to me.  He verbalized understanding.  He reports he thinks that his next appt with Dr. Vista Merritt is Aug 11th but will call today to confirm and will also mention that preferred pharmacy is now [facility].     Objective: The ASCVD Risk score [name]., et al., 2013) failed to calculate for the following reasons:   The systolic blood pressure is missing   Cannot find a previous HDL lab   Cannot find a previous total cholesterol lab  Lab Results  Component Value Date   CREATININE 0.52 08/07/2017   CREATININE 0.58 (L) 02/05/2016   CREATININE 0.58 (L) 11/28/2015    No results found for: HGBA1C  Lipid Panel  No results found for: CHOL, TRIG, HDL, CHOLHDL, VLDL, LDLCALC, LDLDIRECT  BP Readings from Last 3 Encounters:  10/29/17 (!) 154/91  08/31/17 126/74  08/07/17 120/78    No Known Allergies  Medications Reviewed Today    Reviewed by Danny Capers, RN (Registered Nurse) on 10/29/17 at 1001  Med List Status: <None>  Medication Order Taking? Sig Documenting Provider Last Dose Status Informant  albuterol (PROVENTIL HFA;VENTOLIN HFA) 108 (90  Base) MCG/ACT inhaler [id number] Yes Inhale 2 puffs into the lungs every 4 (four) hours as needed for wheezing or shortness of breath. Parrett, Fonnie Mu, NP Taking Active   Aspirin-Caffeine (BC FAST PAIN RELIEF PO) [id number] Yes Take 1 packet by mouth every 6 (six) hours as needed (for headaches). [provider] Taking Active Self        Discontinued 10/29/17 1000 (Completed Course)           Assessment: Patient having difficulty with transportation to his pharmacy.  His sister uses a mail order pharmacy through her insurance and has encouraged patient to do the same.  He is agreeable to this and will ask providers to sent prescriptions to [facility].  He denies issues with paying for medications at this time.    Plan:  Will fax note to PCP re: pharmacy change and will update preference in [facility] . Will close [facility] pharmacy case as no further medication needs identified at this time.  Am happy to assist in the future as needed.     [name], PharmD, [name] [phone]

## 2019-04-02 ENCOUNTER — Other Ambulatory Visit: Payer: Self-pay

## 2019-04-02 NOTE — Patient Outreach (Signed)
[facility]) Care Management  04/02/2019  [name] 08/09/1966 [id number]   Successful follow up call to patient today.  Unfortunately, he did not receive the Medicaid application that was mailed on 03/19/19.  Will mail again. Per patient, a representative with [employer] made a "house call" yesterday.  Patient reported to representative that he is having difficulty with transportation to MD appointments and [facility] rides have been exhausted.  Per patient, he was told that someone from [facility] will contact him soon regarding additional rides.   BSW will follow up within the next two weeks to ensure receipt of Medicaid application.  Ronn Melena, BSW Social Worker [phone]

## 2019-04-10 DIAGNOSIS — Z1211 Encounter for screening for malignant neoplasm of colon: Secondary | ICD-10-CM | POA: Diagnosis not present

## 2019-04-11 ENCOUNTER — Other Ambulatory Visit: Payer: Self-pay

## 2019-04-11 NOTE — Patient Outreach (Signed)
[facility]) Care Management  04/11/2019  [name] 02-21-1966 [id number]  Incoming call from patient today.  Patient reported that he received and completed Medicaid application that was mailed on 04/02/19.  He plans to take application and required documentation to [place] on Monday.  Encouraged him to call to ensure that office is open and mail application if not.  BSW is closing case at this time but encouraged him to call if additional needs arise.   Ronn Melena, BSW Social Worker [phone]

## 2019-04-14 ENCOUNTER — Ambulatory Visit: Payer: Self-pay

## 2019-04-30 DIAGNOSIS — M7581 Other shoulder lesions, right shoulder: Secondary | ICD-10-CM | POA: Diagnosis not present

## 2019-04-30 DIAGNOSIS — E782 Mixed hyperlipidemia: Secondary | ICD-10-CM | POA: Diagnosis not present

## 2019-04-30 DIAGNOSIS — E538 Deficiency of other specified B group vitamins: Secondary | ICD-10-CM | POA: Diagnosis not present

## 2019-04-30 DIAGNOSIS — K429 Umbilical hernia without obstruction or gangrene: Secondary | ICD-10-CM | POA: Diagnosis not present

## 2019-05-15 DIAGNOSIS — M9983 Other biomechanical lesions of lumbar region: Secondary | ICD-10-CM | POA: Diagnosis not present

## 2019-05-20 DIAGNOSIS — Z1329 Encounter for screening for other suspected endocrine disorder: Secondary | ICD-10-CM | POA: Diagnosis not present

## 2019-05-20 DIAGNOSIS — Z131 Encounter for screening for diabetes mellitus: Secondary | ICD-10-CM | POA: Diagnosis not present

## 2019-05-20 DIAGNOSIS — Z0001 Encounter for general adult medical examination with abnormal findings: Secondary | ICD-10-CM | POA: Diagnosis not present

## 2019-05-20 DIAGNOSIS — Z136 Encounter for screening for cardiovascular disorders: Secondary | ICD-10-CM | POA: Diagnosis not present

## 2019-05-20 DIAGNOSIS — Z01021 Encounter for examination of eyes and vision following failed vision screening with abnormal findings: Secondary | ICD-10-CM | POA: Diagnosis not present

## 2019-05-20 DIAGNOSIS — Z23 Encounter for immunization: Secondary | ICD-10-CM | POA: Diagnosis not present

## 2019-05-21 ENCOUNTER — Other Ambulatory Visit: Payer: Self-pay | Admitting: Gastroenterology

## 2019-05-21 DIAGNOSIS — R195 Other fecal abnormalities: Secondary | ICD-10-CM | POA: Diagnosis not present

## 2019-05-21 DIAGNOSIS — K625 Hemorrhage of anus and rectum: Secondary | ICD-10-CM | POA: Diagnosis not present

## 2019-05-29 ENCOUNTER — Encounter (HOSPITAL_COMMUNITY): Payer: Self-pay | Admitting: *Deleted

## 2019-05-29 ENCOUNTER — Other Ambulatory Visit: Payer: Self-pay

## 2019-05-29 ENCOUNTER — Other Ambulatory Visit (HOSPITAL_COMMUNITY)
Admission: RE | Admit: 2019-05-29 | Discharge: 2019-05-29 | Disposition: A | Discharge status: Home / Self Care | Payer: Medicare Other | Source: Ambulatory Visit | Attending: Gastroenterology | Admitting: Gastroenterology

## 2019-05-29 DIAGNOSIS — Z20828 Contact with and (suspected) exposure to other viral communicable diseases: Secondary | ICD-10-CM | POA: Diagnosis not present

## 2019-05-29 DIAGNOSIS — Z01812 Encounter for preprocedural laboratory examination: Secondary | ICD-10-CM | POA: Diagnosis not present

## 2019-05-30 LAB — NOVEL CORONAVIRUS, NAA (HOSP ORDER, SEND-OUT TO REF LAB; TAT 18-24 HRS): SARS-CoV-2, NAA: NOT DETECTED

## 2019-06-02 ENCOUNTER — Ambulatory Visit (HOSPITAL_COMMUNITY)
Admission: RE | Admit: 2019-06-02 | Discharge: 2019-06-02 | Disposition: A | Discharge status: Home / Self Care | Payer: Medicare Other | Attending: Gastroenterology | Admitting: Gastroenterology

## 2019-06-02 ENCOUNTER — Encounter (HOSPITAL_COMMUNITY): Payer: Self-pay | Admitting: Emergency Medicine

## 2019-06-02 ENCOUNTER — Other Ambulatory Visit: Payer: Self-pay

## 2019-06-02 ENCOUNTER — Ambulatory Visit (HOSPITAL_COMMUNITY): Payer: Medicare Other | Admitting: Certified Registered"

## 2019-06-02 ENCOUNTER — Encounter (HOSPITAL_COMMUNITY)
Admission: RE | Disposition: A | Discharge status: Home / Self Care | Payer: Self-pay | Source: Home / Self Care | Attending: Gastroenterology

## 2019-06-02 DIAGNOSIS — K2950 Unspecified chronic gastritis without bleeding: Secondary | ICD-10-CM | POA: Insufficient documentation

## 2019-06-02 DIAGNOSIS — J449 Chronic obstructive pulmonary disease, unspecified: Secondary | ICD-10-CM | POA: Diagnosis not present

## 2019-06-02 DIAGNOSIS — K2970 Gastritis, unspecified, without bleeding: Secondary | ICD-10-CM | POA: Diagnosis not present

## 2019-06-02 DIAGNOSIS — K648 Other hemorrhoids: Secondary | ICD-10-CM | POA: Insufficient documentation

## 2019-06-02 DIAGNOSIS — K319 Disease of stomach and duodenum, unspecified: Secondary | ICD-10-CM | POA: Diagnosis not present

## 2019-06-02 DIAGNOSIS — K921 Melena: Secondary | ICD-10-CM | POA: Diagnosis not present

## 2019-06-02 DIAGNOSIS — K2980 Duodenitis without bleeding: Secondary | ICD-10-CM | POA: Insufficient documentation

## 2019-06-02 DIAGNOSIS — K2100 Gastro-esophageal reflux disease with esophagitis, without bleeding: Secondary | ICD-10-CM | POA: Diagnosis not present

## 2019-06-02 DIAGNOSIS — F17210 Nicotine dependence, cigarettes, uncomplicated: Secondary | ICD-10-CM | POA: Insufficient documentation

## 2019-06-02 DIAGNOSIS — K220 Achalasia of cardia: Secondary | ICD-10-CM | POA: Diagnosis not present

## 2019-06-02 DIAGNOSIS — I10 Essential (primary) hypertension: Secondary | ICD-10-CM | POA: Insufficient documentation

## 2019-06-02 DIAGNOSIS — Z791 Long term (current) use of non-steroidal anti-inflammatories (NSAID): Secondary | ICD-10-CM | POA: Insufficient documentation

## 2019-06-02 DIAGNOSIS — K449 Diaphragmatic hernia without obstruction or gangrene: Secondary | ICD-10-CM | POA: Diagnosis not present

## 2019-06-02 DIAGNOSIS — K222 Esophageal obstruction: Secondary | ICD-10-CM | POA: Diagnosis not present

## 2019-06-02 DIAGNOSIS — R195 Other fecal abnormalities: Secondary | ICD-10-CM | POA: Diagnosis not present

## 2019-06-02 DIAGNOSIS — K621 Rectal polyp: Secondary | ICD-10-CM | POA: Insufficient documentation

## 2019-06-02 DIAGNOSIS — K625 Hemorrhage of anus and rectum: Secondary | ICD-10-CM | POA: Diagnosis not present

## 2019-06-02 HISTORY — PX: ESOPHAGOGASTRODUODENOSCOPY (EGD) WITH PROPOFOL: SHX5813

## 2019-06-02 HISTORY — DX: Unspecified asthma, uncomplicated: J45.909

## 2019-06-02 HISTORY — PX: COLONOSCOPY WITH PROPOFOL: SHX5780

## 2019-06-02 HISTORY — PX: BIOPSY: SHX5522

## 2019-06-02 HISTORY — PX: POLYPECTOMY: SHX5525

## 2019-06-02 SURGERY — ESOPHAGOGASTRODUODENOSCOPY (EGD) WITH PROPOFOL
Anesthesia: Monitor Anesthesia Care

## 2019-06-02 MED ORDER — HYDROCORTISONE ACETATE 25 MG RE SUPP: 25.0000 mg | Freq: Two times a day (BID) | RECTAL | 1 refills | Status: AC

## 2019-06-02 MED ORDER — SODIUM CHLORIDE 0.9 % IV SOLN: INTRAVENOUS | Status: DC

## 2019-06-02 MED ORDER — PANTOPRAZOLE SODIUM 40 MG PO TBEC: 40.0000 mg | DELAYED_RELEASE_TABLET | Freq: Every day | ORAL | 1 refills | Status: DC

## 2019-06-02 MED ORDER — PROPOFOL 10 MG/ML IV BOLUS
INTRAVENOUS | Status: AC
  Filled 2019-06-02: qty 20

## 2019-06-02 MED ORDER — PROPOFOL 500 MG/50ML IV EMUL
INTRAVENOUS | Status: AC
  Filled 2019-06-02: qty 200

## 2019-06-02 MED ORDER — PROPOFOL 500 MG/50ML IV EMUL
INTRAVENOUS | Status: DC | PRN
  Administered 2019-06-02: 135 ug/kg/min via INTRAVENOUS

## 2019-06-02 MED ORDER — ESMOLOL HCL 100 MG/10ML IV SOLN
INTRAVENOUS | Status: DC | PRN
  Administered 2019-06-02 (×2): 50 mg via INTRAVENOUS

## 2019-06-02 MED ORDER — PROPOFOL 500 MG/50ML IV EMUL
INTRAVENOUS | Status: DC | PRN
  Administered 2019-06-02: 20 mg via INTRAVENOUS
  Administered 2019-06-02: 30 mg via INTRAVENOUS
  Administered 2019-06-02 (×2): 20 mg via INTRAVENOUS

## 2019-06-02 MED ORDER — LACTATED RINGERS IV SOLN
INTRAVENOUS | Status: DC
  Administered 2019-06-02 (×2): via INTRAVENOUS

## 2019-06-02 SURGICAL SUPPLY — 24 items

## 2019-06-02 NOTE — Transfer of Care (Signed)
Immediate Anesthesia Transfer of Care Note  Patient: [name]  Procedure(s) Performed: ESOPHAGOGASTRODUODENOSCOPY (EGD) WITH PROPOFOL (N/A ) COLONOSCOPY WITH PROPOFOL (N/A ) BIOPSY  Patient Location: Endoscopy Unit  Anesthesia Type:MAC  Level of Consciousness: awake, oriented and patient cooperative  Airway & Oxygen Therapy: Patient Spontanous Breathing and Patient connected to face mask oxygen  Post-op Assessment: Report given to RN and Post -op Vital signs reviewed and stable  Post vital signs: Reviewed and stable  Last Vitals:  Vitals Value Taken Time  BP 149/93 06/02/19 0941  Temp 36.5 C 06/02/19 0941  Pulse 81 06/02/19 0941  Resp 20 06/02/19 0943  SpO2    Vitals shown include unvalidated device data.  Last Pain:  Vitals:   06/02/19 0941  TempSrc: Temporal  PainSc: 0-No pain         Complications: No apparent anesthesia complications

## 2019-06-02 NOTE — Op Note (Signed)
[facility] Patient Name: [name] Procedure Date: 06/02/2019 MRN: [id number] Attending MD: Otis Brace , MD Date of Birth: 1965/09/01 CSN: [id number] Age: 53 Admit Type: Outpatient Procedure:                Colonoscopy Indications:              This is the patient's first colonoscopy, Rectal                            bleeding Providers:                Otis Brace, MD, Cleda Daub, RN, Lazaro Arms, Technician, Cherylynn Ridges, Technician Referring MD:              Medicines:                Sedation Administered by an Anesthesia Professional Complications:            No immediate complications. Estimated Blood Loss:     Estimated blood loss was minimal. Procedure:                Pre-Anesthesia Assessment:                           - Prior to the procedure, a History and Physical                            was performed, and patient medications and                            allergies were reviewed. The patient's tolerance of                            previous anesthesia was also reviewed. The risks                            and benefits of the procedure and the sedation                            options and risks were discussed with the patient.                            All questions were answered, and informed consent                            was obtained. Prior Anticoagulants: The patient has                            taken no previous anticoagulant or antiplatelet                            agents. ASA Grade Assessment: II - A patient with  mild systemic disease. After reviewing the risks                            and benefits, the patient was deemed in                            satisfactory condition to undergo the procedure.                           After obtaining informed consent, the colonoscope                            was passed under direct vision. Throughout the                  procedure, the patient's blood pressure, pulse, and                            oxygen saturations were monitored continuously. The                            [id number]-H190DL ([id number] Olympus pediatric colonscope                            was introduced through the anus and advanced to the                            the cecum, identified by appendiceal orifice and                            ileocecal valve. The colonoscopy was performed                            without difficulty. The patient tolerated the                            procedure well. The quality of the bowel                            preparation was good. Scope In: 9:17:02 AM Scope Out: 9:33:57 AM Scope Withdrawal Time: 0 hours 13 minutes 28 seconds  Total Procedure Duration: 0 hours 16 minutes 55 seconds  Findings:      Hemorrhoids were found on perianal exam.      Two polyps were found in the rectum. The polyps were diminutive in size.       These polyps were removed with a cold biopsy forceps. Resection and       retrieval were complete.      Internal hemorrhoids were found during retroflexion. The hemorrhoids       were large. Impression:               - Hemorrhoids found on perianal exam.                           - Two diminutive polyps in the rectum, removed with  a cold biopsy forceps. Resected and retrieved.                           - Internal hemorrhoids. Moderate Sedation:      Moderate (conscious) sedation was personally administered by an       anesthesia professional. The following parameters were monitored: oxygen       saturation, heart rate, blood pressure, and response to care. Recommendation:           - Patient has a contact number available for                            emergencies. The signs and symptoms of potential                            delayed complications were discussed with the                            patient. Return to normal activities  tomorrow.                            Written discharge instructions were provided to the                            patient.                           - Resume previous diet.                           - Continue present medications.                           - Await pathology results.                           - Repeat colonoscopy date to be determined after                            pending pathology results are reviewed for                            surveillance based on pathology results.                           - Return to my office PRN. Procedure Code(s):        --- Professional ---                           [phone], Colonoscopy, flexible; with biopsy, single                            or multiple Diagnosis Code(s):        --- Professional ---                           K64.8, Other hemorrhoids  K62.1, Rectal polyp                           K62.5, Hemorrhage of anus and rectum CPT copyright 2019 American Medical Association. All rights reserved. The codes documented in this report are preliminary and upon coder review may  be revised to meet current compliance requirements. Kathi Der, MD Kathi Der, MD 06/02/2019 9:44:42 AM Number of Addenda: 0

## 2019-06-02 NOTE — Discharge Instructions (Signed)

## 2019-06-02 NOTE — Op Note (Signed)
[facility] [name] Procedure Date: 06/02/2019 MRN: [id number] Attending MD: Kathi DerParag Vibhav Waddill , MD Date of Birth: 10-15-1965 CSN: [id number] Age: 53 Admit Type: Outpatient Procedure:                Upper GI endoscopy Indications:              Melena Providers:                Kathi DerParag Tameka Hoiland, MD, Dwain SarnaPatricia Ford, RN, Marc MorgansShaina                            Gold, Technician, Beryle BeamsJanie Billups, Technician Referring MD:              Medicines:                Sedation Administered by an Anesthesia Professional Complications:            No immediate complications. Estimated Blood Loss:     Estimated blood loss was minimal. Procedure:                Pre-Anesthesia Assessment:                           - Prior to the procedure, a History and Physical                            was performed, and patient medications and                            allergies were reviewed. The patient's tolerance of                            previous anesthesia was also reviewed. The risks                            and benefits of the procedure and the sedation                            options and risks were discussed with the patient.                            All questions were answered, and informed consent                            was obtained. Prior Anticoagulants: The patient has                            taken no previous anticoagulant or antiplatelet                            agents. ASA Grade Assessment: II - A patient with                            mild systemic disease. After reviewing the risks  and benefits, the patient was deemed in                            satisfactory condition to undergo the procedure.                           After obtaining informed consent, the endoscope was                            passed under direct vision. Throughout the                            procedure, the patient's blood pressure, pulse, and                             oxygen saturations were monitored continuously. The                            GIF-H190 ([id number]) Olympus gastroscope was                            introduced through the mouth, and advanced to the                            second part of duodenum. The upper GI endoscopy was                            accomplished without difficulty. The patient                            tolerated the procedure well. Findings:      LA Grade A (one or more mucosal breaks less than 5 mm, not extending       between tops of 2 mucosal folds) esophagitis with no bleeding was found       at the gastroesophageal junction. Biopsies were taken with a cold       forceps for histology.      A non-obstructing Schatzki ring was found at the gastroesophageal       junction.      A small hiatal hernia was present.      Scattered mild inflammation was found in the gastric body and in the       prepyloric region of the stomach. Biopsies were taken with a cold       forceps for histology.      The cardia and gastric fundus were normal on retroflexion.      Scattered mild inflammation was found in the duodenal bulb. Biopsies       were taken with a cold forceps for histology.      The first portion of the duodenum and second portion of the duodenum       were normal. Impression:               - LA Grade A reflux esophagitis. Biopsied.                           - Non-obstructing Schatzki ring.                           -  Small hiatal hernia.                           - Gastritis. Biopsied.                           - Duodenitis. Biopsied.                           - Normal first portion of the duodenum and second                            portion of the duodenum. Moderate Sedation:      Moderate (conscious) sedation was personally administered by an       anesthesia professional. The following parameters were monitored: oxygen       saturation, heart rate, blood pressure, and response to  care. Recommendation:           - Patient has a contact number available for                            emergencies. The signs and symptoms of potential                            delayed complications were discussed with the                            patient. Return to normal activities tomorrow.                            Written discharge instructions were provided to the                            patient.                           - Resume previous diet.                           - Resume previous diet.                           - Perform a colonoscopy today.                           - Use Protonix (pantoprazole) 40 mg PO daily. Procedure Code(s):        --- Professional ---                           [phone], Esophagogastroduodenoscopy, flexible,                            transoral; with biopsy, single or multiple Diagnosis Code(s):        --- Professional ---                           K21.0, Gastro-esophageal reflux disease with  esophagitis                           K22.2, Esophageal obstruction                           K44.9, Diaphragmatic hernia without obstruction or                            gangrene                           K29.70, Gastritis, unspecified, without bleeding                           K29.80, Duodenitis without bleeding                           K92.1, Melena (includes Hematochezia) CPT copyright 2019 American Medical Association. All rights reserved. The codes documented in this report are preliminary and upon coder review may  be revised to meet current compliance requirements. Otis Brace, MD Otis Brace, MD 06/02/2019 9:41:15 AM Number of Addenda: 0

## 2019-06-02 NOTE — Anesthesia Preprocedure Evaluation (Signed)
Anesthesia Evaluation  Patient identified by MRN, date of birth, ID band Patient awake    Reviewed: Allergy & Precautions, NPO status , Patient's Chart, lab work & pertinent test results  History of Anesthesia Complications Negative for: history of anesthetic complications  Airway Mallampati: II  TM Distance: >3 FB Neck ROM: Full    Dental  (+) Missing, Chipped,    Pulmonary shortness of breath, asthma , COPD,  COPD inhaler, neg recent URI, Current Smoker and Patient abstained from smoking.,    breath sounds clear to auscultation       Cardiovascular hypertension,  Rhythm:Regular     Neuro/Psych PSYCHIATRIC DISORDERS Anxiety Depression negative neurological ROS     GI/Hepatic negative GI ROS, (+)     substance abuse  alcohol use,   Endo/Other  negative endocrine ROS  Renal/GU      Musculoskeletal  (+) Arthritis ,   Abdominal   Peds  Hematology negative hematology ROS (+)   Anesthesia Other Findings   Reproductive/Obstetrics                             Anesthesia Physical Anesthesia Plan  ASA: II  Anesthesia Plan: MAC   Post-op Pain Management:    Induction: Intravenous  PONV Risk Score and Plan: 0 and Propofol infusion and Treatment may vary due to age or medical condition  Airway Management Planned: Nasal Cannula  Additional Equipment: None  Intra-op Plan:   Post-operative Plan:   Informed Consent: I have reviewed the patients History and Physical, chart, labs and discussed the procedure including the risks, benefits and alternatives for the proposed anesthesia with the patient or authorized representative who has indicated his/her understanding and acceptance.     Dental advisory given  Plan Discussed with: CRNA and Surgeon  Anesthesia Plan Comments:         Anesthesia Quick Evaluation

## 2019-06-02 NOTE — H&P (Signed)
Primary Care Physician:  Jackie Plum, MD Primary Gastroenterologist:  Dr. Levora Angel  Reason for Visit : Dark stools and rectal bleeding  HPI: [name] is a 53 y.o. male  patient with past medical history of COPD, alcohol use and alcohol-induced neuropathy was referred to GI for scheduling colonoscopy.  No family history of colon cancer. No previous colonoscopy.  He is complaining of intermittent dark stool as well as bright red blood per rectum for many years.  Denies any abdominal pain, nausea and vomiting.  Denies any diarrhea or constipation.  Denies any reflux, dysphagia and odynophagia.   Past Medical History:  Diagnosis Date  . Alcohol abuse   . Allergy   . Anxiety   . Arthritis   . Asthma   . COPD (chronic obstructive pulmonary disease) (HCC)   . Depression   . Hypertension   . Shortness of breath     Past Surgical History:  Procedure Laterality Date  . BACK SURGERY    . HEMORRHOID SURGERY    . HERNIA REPAIR    . MANDIBLE FRACTURE SURGERY      Prior to Admission medications   Medication Sig Start Date End Date Taking? Authorizing Provider  albuterol (PROVENTIL HFA;VENTOLIN HFA) 108 (90 Base) MCG/ACT inhaler Inhale 2 puffs into the lungs every 4 (four) hours as needed for wheezing or shortness of breath. 08/31/17  Yes Parrett, Tammy S, NP  Aspirin-Caffeine (BC FAST PAIN RELIEF PO) Take 1 packet by mouth every 6 (six) hours as needed (for headaches).   Yes [provider]  gabapentin (NEURONTIN) 300 MG capsule Take 1 capsule by mouth 3 (three) times daily. 02/11/19  Yes [provider]  meloxicam (MOBIC) 7.5 MG tablet Take 1-2 tablets by mouth daily. 01/09/19  Yes [provider]  tiZANidine (ZANAFLEX) 2 MG tablet Take 1-2 tablets by mouth every 8 (eight) hours as needed. 01/08/19  Yes [provider]    Scheduled Meds: Continuous Infusions: . lactated ringers 10 mL/hr at 06/02/19 0755   PRN Meds:.  Allergies as of  05/21/2019  . (No Known Allergies)    Family History  Problem Relation Age of Onset  . Hypertension Mother   . Hypertension Father   . Hypertension Brother     Social History   Socioeconomic History  . Marital status: Legally Separated    Spouse name: Not on file  . Number of children: Not on file  . Years of education: Not on file  . Highest education level: Not on file  Occupational History  . Not on file  Social Needs  . Financial resource strain: Not on file  . Food insecurity    Worry: Not on file    Inability: Not on file  . Transportation needs    Medical: Not on file    Non-medical: Not on file  Tobacco Use  . Smoking status: Current Every Day Smoker    Packs/day: 0.50    Years: 35.00    Pack years: 17.50    Types: Cigarettes  . Smokeless tobacco: Never Used  . Tobacco comment: currently smoking 1ppd as of 08/07/17 ep  Substance and Sexual Activity  . Alcohol use: Yes    Alcohol/week: 20.0 standard drinks    Types: 20 Cans of beer per week    Comment: 3-4 beers daily  . Drug use: No  . Sexual activity: Yes    Birth control/protection: None  Lifestyle  . Physical activity    Days per week: Not  on file    Minutes per session: Not on file  . Stress: Not on file  Relationships  . Social Herbalist on phone: Not on file    Gets together: Not on file    Attends religious service: Not on file    Active member of club or organization: Not on file    Attends meetings of clubs or organizations: Not on file    Relationship status: Not on file  . Intimate partner violence    Fear of current or ex partner: Not on file    Emotionally abused: Not on file    Physically abused: Not on file    Forced sexual activity: Not on file  Other Topics Concern  . Not on file  Social History Narrative  . Not on file    Review of Systems: All negative except as stated above in HPI.  Physical Exam: Vital signs: Vitals:   06/02/19 0747  BP: (!) 161/86   Pulse: 100  Resp: 18  Temp: 98.6 F (37 C)  SpO2: 97%     General:   Alert,  Well-developed, well-nourished, pleasant and cooperative in NAD Lungs:  Clear throughout to auscultation.   No wheezes, crackles, or rhonchi. No acute distress. Heart:  Regular rate and rhythm; no murmurs, clicks, rubs,  or gallops. Abdomen: Soft, nontender, nondistended, bowel sounds present, no peritoneal signs Rectal:  Deferred  GI:  Lab Results: No results for input(s): WBC, HGB, HCT, PLT in the last 72 hours. BMET No results for input(s): NA, K, CL, CO2, GLUCOSE, BUN, CREATININE, CALCIUM in the last 72 hours. LFT No results for input(s): PROT, ALBUMIN, AST, ALT, ALKPHOS, BILITOT, BILIDIR, IBILI in the last 72 hours. PT/INR No results for input(s): LABPROT, INR in the last 72 hours.   Studies/Results: No results found.  Impression/Plan: Dark stools  Rectal bleeding   Recommendations ----------------------- -Proceed with EGD and colonoscopy.  Risks (bleeding, infection, bowel perforation that could require surgery, sedation-related changes in cardiopulmonary systems), benefits (identification and possible treatment of source of symptoms, exclusion of certain causes of symptoms), and alternatives (watchful waiting, radiographic imaging studies, empiric medical treatment)  were explained to patient in detail and patient wishes to proceed.    LOS: 0 days   Otis Brace  MD, FACP 06/02/2019, 8:31 AM  Contact #  [phone]

## 2019-06-03 ENCOUNTER — Other Ambulatory Visit: Payer: Self-pay

## 2019-06-03 LAB — SURGICAL PATHOLOGY

## 2019-06-03 NOTE — Anesthesia Postprocedure Evaluation (Signed)
Anesthesia Post Note  Patient: [name]  Procedure(s) Performed: ESOPHAGOGASTRODUODENOSCOPY (EGD) WITH PROPOFOL (N/A ) COLONOSCOPY WITH PROPOFOL (N/A ) BIOPSY POLYPECTOMY     Patient location during evaluation: Endoscopy Anesthesia Type: MAC Level of consciousness: awake and alert Pain management: pain level controlled Vital Signs Assessment: post-procedure vital signs reviewed and stable Respiratory status: spontaneous breathing, nonlabored ventilation, respiratory function stable and patient connected to nasal cannula oxygen Cardiovascular status: stable and blood pressure returned to baseline Postop Assessment: no apparent nausea or vomiting Anesthetic complications: no    Last Vitals:  Vitals:   06/02/19 1000 06/02/19 1010  BP: (!) 144/91 (!) 160/99  Pulse: 74 80  Resp: 16 19  Temp:    SpO2: 97% 96%    Last Pain:  Vitals:   06/02/19 1010  TempSrc:   PainSc: 0-No pain                 Tae Robak

## 2019-06-06 DIAGNOSIS — M7581 Other shoulder lesions, right shoulder: Secondary | ICD-10-CM | POA: Diagnosis not present

## 2019-06-06 DIAGNOSIS — Z1389 Encounter for screening for other disorder: Secondary | ICD-10-CM | POA: Diagnosis not present

## 2019-06-06 DIAGNOSIS — Z0001 Encounter for general adult medical examination with abnormal findings: Secondary | ICD-10-CM | POA: Diagnosis not present

## 2019-06-06 DIAGNOSIS — E782 Mixed hyperlipidemia: Secondary | ICD-10-CM | POA: Diagnosis not present

## 2019-06-06 DIAGNOSIS — K429 Umbilical hernia without obstruction or gangrene: Secondary | ICD-10-CM | POA: Diagnosis not present

## 2019-06-06 DIAGNOSIS — J449 Chronic obstructive pulmonary disease, unspecified: Secondary | ICD-10-CM | POA: Diagnosis not present

## 2019-06-16 DIAGNOSIS — M9983 Other biomechanical lesions of lumbar region: Secondary | ICD-10-CM | POA: Diagnosis not present

## 2019-06-16 DIAGNOSIS — M4316 Spondylolisthesis, lumbar region: Secondary | ICD-10-CM | POA: Diagnosis not present

## 2019-06-16 DIAGNOSIS — I10 Essential (primary) hypertension: Secondary | ICD-10-CM | POA: Diagnosis not present

## 2019-07-02 DIAGNOSIS — K429 Umbilical hernia without obstruction or gangrene: Secondary | ICD-10-CM | POA: Diagnosis not present

## 2019-07-02 DIAGNOSIS — E538 Deficiency of other specified B group vitamins: Secondary | ICD-10-CM | POA: Diagnosis not present

## 2019-07-02 DIAGNOSIS — M7581 Other shoulder lesions, right shoulder: Secondary | ICD-10-CM | POA: Diagnosis not present

## 2019-07-02 DIAGNOSIS — E782 Mixed hyperlipidemia: Secondary | ICD-10-CM | POA: Diagnosis not present

## 2019-07-02 DIAGNOSIS — J449 Chronic obstructive pulmonary disease, unspecified: Secondary | ICD-10-CM | POA: Diagnosis not present

## 2019-09-15 DIAGNOSIS — M545 Low back pain: Secondary | ICD-10-CM | POA: Diagnosis not present

## 2019-09-15 DIAGNOSIS — M9983 Other biomechanical lesions of lumbar region: Secondary | ICD-10-CM | POA: Diagnosis not present

## 2019-10-11 ENCOUNTER — Encounter (HOSPITAL_COMMUNITY): Payer: Self-pay | Admitting: *Deleted

## 2019-10-11 ENCOUNTER — Other Ambulatory Visit: Payer: Self-pay

## 2019-10-11 ENCOUNTER — Inpatient Hospital Stay (HOSPITAL_COMMUNITY)
Admission: EM | Admit: 2019-10-11 | Discharge: 2019-10-16 | DRG: 189 | Disposition: A | Discharge status: Home Health | Payer: Medicare Other | Attending: Student | Admitting: Student

## 2019-10-11 ENCOUNTER — Emergency Department (HOSPITAL_COMMUNITY): Payer: Medicare Other

## 2019-10-11 DIAGNOSIS — K2100 Gastro-esophageal reflux disease with esophagitis, without bleeding: Secondary | ICD-10-CM | POA: Diagnosis present

## 2019-10-11 DIAGNOSIS — K625 Hemorrhage of anus and rectum: Secondary | ICD-10-CM | POA: Diagnosis not present

## 2019-10-11 DIAGNOSIS — R5381 Other malaise: Secondary | ICD-10-CM | POA: Diagnosis present

## 2019-10-11 DIAGNOSIS — Z8249 Family history of ischemic heart disease and other diseases of the circulatory system: Secondary | ICD-10-CM | POA: Diagnosis not present

## 2019-10-11 DIAGNOSIS — Z20822 Contact with and (suspected) exposure to covid-19: Secondary | ICD-10-CM | POA: Diagnosis present

## 2019-10-11 DIAGNOSIS — F10230 Alcohol dependence with withdrawal, uncomplicated: Secondary | ICD-10-CM

## 2019-10-11 DIAGNOSIS — I10 Essential (primary) hypertension: Secondary | ICD-10-CM | POA: Diagnosis not present

## 2019-10-11 DIAGNOSIS — D72819 Decreased white blood cell count, unspecified: Secondary | ICD-10-CM | POA: Diagnosis present

## 2019-10-11 DIAGNOSIS — G9341 Metabolic encephalopathy: Secondary | ICD-10-CM | POA: Diagnosis not present

## 2019-10-11 DIAGNOSIS — F329 Major depressive disorder, single episode, unspecified: Secondary | ICD-10-CM | POA: Diagnosis present

## 2019-10-11 DIAGNOSIS — Z7141 Alcohol abuse counseling and surveillance of alcoholic: Secondary | ICD-10-CM

## 2019-10-11 DIAGNOSIS — G8929 Other chronic pain: Secondary | ICD-10-CM | POA: Diagnosis present

## 2019-10-11 DIAGNOSIS — R451 Restlessness and agitation: Secondary | ICD-10-CM | POA: Diagnosis not present

## 2019-10-11 DIAGNOSIS — Z7982 Long term (current) use of aspirin: Secondary | ICD-10-CM | POA: Diagnosis not present

## 2019-10-11 DIAGNOSIS — Z743 Need for continuous supervision: Secondary | ICD-10-CM | POA: Diagnosis not present

## 2019-10-11 DIAGNOSIS — E440 Moderate protein-calorie malnutrition: Secondary | ICD-10-CM | POA: Diagnosis not present

## 2019-10-11 DIAGNOSIS — J441 Chronic obstructive pulmonary disease with (acute) exacerbation: Secondary | ICD-10-CM | POA: Diagnosis not present

## 2019-10-11 DIAGNOSIS — F1010 Alcohol abuse, uncomplicated: Secondary | ICD-10-CM | POA: Diagnosis present

## 2019-10-11 DIAGNOSIS — D696 Thrombocytopenia, unspecified: Secondary | ICD-10-CM | POA: Diagnosis present

## 2019-10-11 DIAGNOSIS — M5410 Radiculopathy, site unspecified: Secondary | ICD-10-CM | POA: Diagnosis present

## 2019-10-11 DIAGNOSIS — E876 Hypokalemia: Secondary | ICD-10-CM | POA: Diagnosis not present

## 2019-10-11 DIAGNOSIS — R0602 Shortness of breath: Secondary | ICD-10-CM | POA: Diagnosis not present

## 2019-10-11 DIAGNOSIS — R7401 Elevation of levels of liver transaminase levels: Secondary | ICD-10-CM | POA: Diagnosis not present

## 2019-10-11 DIAGNOSIS — Z716 Tobacco abuse counseling: Secondary | ICD-10-CM

## 2019-10-11 DIAGNOSIS — R339 Retention of urine, unspecified: Secondary | ICD-10-CM | POA: Diagnosis not present

## 2019-10-11 DIAGNOSIS — J9601 Acute respiratory failure with hypoxia: Secondary | ICD-10-CM | POA: Diagnosis not present

## 2019-10-11 DIAGNOSIS — D638 Anemia in other chronic diseases classified elsewhere: Secondary | ICD-10-CM | POA: Diagnosis not present

## 2019-10-11 DIAGNOSIS — D61818 Other pancytopenia: Secondary | ICD-10-CM | POA: Diagnosis not present

## 2019-10-11 DIAGNOSIS — F10239 Alcohol dependence with withdrawal, unspecified: Secondary | ICD-10-CM | POA: Diagnosis not present

## 2019-10-11 DIAGNOSIS — M5489 Other dorsalgia: Secondary | ICD-10-CM | POA: Diagnosis not present

## 2019-10-11 DIAGNOSIS — R000 Tachycardia, unspecified: Secondary | ICD-10-CM | POA: Diagnosis present

## 2019-10-11 DIAGNOSIS — F10231 Alcohol dependence with withdrawal delirium: Secondary | ICD-10-CM | POA: Diagnosis not present

## 2019-10-11 DIAGNOSIS — R748 Abnormal levels of other serum enzymes: Secondary | ICD-10-CM | POA: Diagnosis not present

## 2019-10-11 DIAGNOSIS — Z79899 Other long term (current) drug therapy: Secondary | ICD-10-CM

## 2019-10-11 DIAGNOSIS — F17210 Nicotine dependence, cigarettes, uncomplicated: Secondary | ICD-10-CM | POA: Diagnosis present

## 2019-10-11 DIAGNOSIS — J302 Other seasonal allergic rhinitis: Secondary | ICD-10-CM | POA: Diagnosis not present

## 2019-10-11 DIAGNOSIS — Z720 Tobacco use: Secondary | ICD-10-CM | POA: Diagnosis not present

## 2019-10-11 DIAGNOSIS — Z781 Physical restraint status: Secondary | ICD-10-CM

## 2019-10-11 DIAGNOSIS — J9691 Respiratory failure, unspecified with hypoxia: Secondary | ICD-10-CM | POA: Diagnosis present

## 2019-10-11 DIAGNOSIS — F419 Anxiety disorder, unspecified: Secondary | ICD-10-CM | POA: Diagnosis present

## 2019-10-11 DIAGNOSIS — F10939 Alcohol use, unspecified with withdrawal, unspecified: Secondary | ICD-10-CM | POA: Diagnosis present

## 2019-10-11 DIAGNOSIS — Z6820 Body mass index (BMI) 20.0-20.9, adult: Secondary | ICD-10-CM

## 2019-10-11 DIAGNOSIS — R58 Hemorrhage, not elsewhere classified: Secondary | ICD-10-CM | POA: Diagnosis not present

## 2019-10-11 LAB — CBC
HCT: 34.9 % — ABNORMAL LOW (ref 39.0–52.0)
Hemoglobin: 12.9 g/dL — ABNORMAL LOW (ref 13.0–17.0)
MCH: 36.2 pg — ABNORMAL HIGH (ref 26.0–34.0)
MCHC: 37 g/dL — ABNORMAL HIGH (ref 30.0–36.0)
MCV: 98 fL (ref 80.0–100.0)
Platelets: 126 10*3/uL — ABNORMAL LOW (ref 150–400)
RBC: 3.56 MIL/uL — ABNORMAL LOW (ref 4.22–5.81)
RDW: 15.2 % (ref 11.5–15.5)
WBC: 3.8 10*3/uL — ABNORMAL LOW (ref 4.0–10.5)
nRBC: 0 % (ref 0.0–0.2)

## 2019-10-11 LAB — COMPREHENSIVE METABOLIC PANEL
ALT: 37 U/L (ref 0–44)
AST: 73 U/L — ABNORMAL HIGH (ref 15–41)
Albumin: 4.1 g/dL (ref 3.5–5.0)
Alkaline Phosphatase: 70 U/L (ref 38–126)
Anion gap: 13 (ref 5–15)
BUN: <5 mg/dL — ABNORMAL LOW (ref 6–20)
CO2: 26 mmol/L (ref 22–32)
Calcium: 9.1 mg/dL (ref 8.9–10.3)
Chloride: 96 mmol/L — ABNORMAL LOW (ref 98–111)
Creatinine, Ser: 0.54 mg/dL — ABNORMAL LOW (ref 0.61–1.24)
GFR calc Af Amer: >60 mL/min (ref >60)
GFR calc non Af Amer: >60 mL/min (ref >60)
Glucose, Bld: 80 mg/dL (ref 70–99)
Potassium: 3.5 mmol/L (ref 3.5–5.1)
Sodium: 135 mmol/L (ref 135–145)
Total Bilirubin: 0.6 mg/dL (ref 0.3–1.2)
Total Protein: 7.6 g/dL (ref 6.5–8.1)

## 2019-10-11 LAB — TYPE AND SCREEN
ABO/RH(D): O POS
Antibody Screen: NEGATIVE

## 2019-10-11 LAB — RESPIRATORY PANEL BY RT PCR (FLU A&B, COVID)
Influenza A by PCR: NEGATIVE
Influenza B by PCR: NEGATIVE
SARS Coronavirus 2 by RT PCR: NEGATIVE

## 2019-10-11 LAB — ABO/RH: ABO/RH(D): O POS

## 2019-10-11 LAB — PHOSPHORUS: Phosphorus: 4.1 mg/dL (ref 2.5–4.6)

## 2019-10-11 LAB — POC OCCULT BLOOD, ED: Fecal Occult Bld: NEGATIVE

## 2019-10-11 MED ORDER — SODIUM CHLORIDE 0.9 % IV SOLN
500.0000 mg | INTRAVENOUS | Status: AC
  Administered 2019-10-11: 500 mg via INTRAVENOUS
  Filled 2019-10-11: qty 500

## 2019-10-11 MED ORDER — THIAMINE HCL 100 MG/ML IJ SOLN
100.0000 mg | Freq: Every day | INTRAMUSCULAR | Status: DC
  Administered 2019-10-12: 100 mg via INTRAVENOUS
  Filled 2019-10-11 (×2): qty 2

## 2019-10-11 MED ORDER — SODIUM CHLORIDE 0.9% FLUSH
3.0000 mL | Freq: Two times a day (BID) | INTRAVENOUS | Status: DC
  Administered 2019-10-11 – 2019-10-16 (×10): 3 mL via INTRAVENOUS

## 2019-10-11 MED ORDER — LORAZEPAM 2 MG/ML IJ SOLN
1.0000 mg | INTRAMUSCULAR | Status: AC | PRN
  Administered 2019-10-11 (×3): 2 mg via INTRAVENOUS
  Administered 2019-10-11: 1 mg via INTRAVENOUS
  Administered 2019-10-12 (×2): 4 mg via INTRAVENOUS
  Administered 2019-10-12 (×3): 2 mg via INTRAVENOUS
  Administered 2019-10-12: 4 mg via INTRAVENOUS
  Administered 2019-10-12: 2 mg via INTRAVENOUS
  Administered 2019-10-12: 3 mg via INTRAVENOUS
  Administered 2019-10-12: 2 mg via INTRAVENOUS
  Administered 2019-10-12: 3 mg via INTRAVENOUS
  Filled 2019-10-11: qty 2
  Filled 2019-10-11: qty 1
  Filled 2019-10-11: qty 2
  Filled 2019-10-11: qty 1
  Filled 2019-10-11: qty 2
  Filled 2019-10-11 (×5): qty 1
  Filled 2019-10-11: qty 2
  Filled 2019-10-11 (×2): qty 1
  Filled 2019-10-11: qty 2

## 2019-10-11 MED ORDER — LORAZEPAM 1 MG PO TABS
1.0000 mg | ORAL_TABLET | ORAL | Status: DC | PRN
  Administered 2019-10-12: 2 mg via ORAL
  Filled 2019-10-11 (×2): qty 2

## 2019-10-11 MED ORDER — GABAPENTIN 300 MG PO CAPS
300.0000 mg | ORAL_CAPSULE | Freq: Three times a day (TID) | ORAL | Status: DC
  Administered 2019-10-11 – 2019-10-16 (×15): 300 mg via ORAL
  Filled 2019-10-11: qty 1
  Filled 2019-10-11: qty 3
  Filled 2019-10-11 (×13): qty 1

## 2019-10-11 MED ORDER — THIAMINE HCL 100 MG PO TABS
100.0000 mg | ORAL_TABLET | Freq: Every day | ORAL | Status: DC
  Administered 2019-10-11 – 2019-10-16 (×5): 100 mg via ORAL
  Filled 2019-10-11 (×5): qty 1

## 2019-10-11 MED ORDER — LORAZEPAM 2 MG/ML IJ SOLN: 0.0000 mg | Freq: Two times a day (BID) | INTRAMUSCULAR | Status: DC

## 2019-10-11 MED ORDER — TIZANIDINE HCL 4 MG PO TABS
2.0000 mg | ORAL_TABLET | Freq: Three times a day (TID) | ORAL | Status: DC | PRN
  Filled 2019-10-11: qty 1

## 2019-10-11 MED ORDER — ENOXAPARIN SODIUM 40 MG/0.4ML ~~LOC~~ SOLN
40.0000 mg | SUBCUTANEOUS | Status: DC
  Administered 2019-10-11 – 2019-10-15 (×5): 40 mg via SUBCUTANEOUS
  Filled 2019-10-11 (×5): qty 0.4

## 2019-10-11 MED ORDER — ALBUTEROL SULFATE HFA 108 (90 BASE) MCG/ACT IN AERS
8.0000 | INHALATION_SPRAY | Freq: Once | RESPIRATORY_TRACT | Status: AC
  Administered 2019-10-11: 06:00:00 8 via RESPIRATORY_TRACT

## 2019-10-11 MED ORDER — AEROCHAMBER PLUS FLO-VU LARGE MISC
Status: AC
  Filled 2019-10-11: qty 1

## 2019-10-11 MED ORDER — ALBUTEROL SULFATE HFA 108 (90 BASE) MCG/ACT IN AERS
8.0000 | INHALATION_SPRAY | Freq: Once | RESPIRATORY_TRACT | Status: AC
  Administered 2019-10-11: 8 via RESPIRATORY_TRACT
  Filled 2019-10-11: qty 6.7

## 2019-10-11 MED ORDER — ACETAMINOPHEN 325 MG PO TABS: 650.0000 mg | ORAL_TABLET | Freq: Four times a day (QID) | ORAL | Status: DC | PRN

## 2019-10-11 MED ORDER — ACETAMINOPHEN 650 MG RE SUPP: 650.0000 mg | Freq: Four times a day (QID) | RECTAL | Status: DC | PRN

## 2019-10-11 MED ORDER — METHYLPREDNISOLONE SODIUM SUCC 125 MG IJ SOLR
125.0000 mg | Freq: Once | INTRAMUSCULAR | Status: AC
  Administered 2019-10-11: 125 mg via INTRAVENOUS
  Filled 2019-10-11: qty 2

## 2019-10-11 MED ORDER — GUAIFENESIN ER 600 MG PO TB12
600.0000 mg | ORAL_TABLET | Freq: Two times a day (BID) | ORAL | Status: DC
  Administered 2019-10-11 – 2019-10-16 (×10): 600 mg via ORAL
  Filled 2019-10-11 (×12): qty 1

## 2019-10-11 MED ORDER — LORAZEPAM 2 MG/ML IJ SOLN
0.0000 mg | Freq: Four times a day (QID) | INTRAMUSCULAR | Status: DC
  Administered 2019-10-11 (×3): 2 mg via INTRAVENOUS
  Administered 2019-10-12: 4 mg via INTRAVENOUS
  Filled 2019-10-11: qty 1
  Filled 2019-10-11: qty 2
  Filled 2019-10-11 (×2): qty 1

## 2019-10-11 MED ORDER — LORAZEPAM 2 MG/ML IJ SOLN
1.0000 mg | Freq: Once | INTRAMUSCULAR | Status: AC
  Administered 2019-10-11: 1 mg via INTRAVENOUS
  Filled 2019-10-11: qty 1

## 2019-10-11 MED ORDER — HYDROCORTISONE ACETATE 25 MG RE SUPP
25.0000 mg | Freq: Two times a day (BID) | RECTAL | Status: DC
  Filled 2019-10-11 (×5): qty 1

## 2019-10-11 MED ORDER — FOLIC ACID 1 MG PO TABS
1.0000 mg | ORAL_TABLET | Freq: Every day | ORAL | Status: DC
  Administered 2019-10-11 – 2019-10-16 (×6): 1 mg via ORAL
  Filled 2019-10-11 (×6): qty 1

## 2019-10-11 MED ORDER — IOHEXOL 350 MG/ML SOLN
80.0000 mL | Freq: Once | INTRAVENOUS | Status: AC | PRN
  Administered 2019-10-11: 80 mL via INTRAVENOUS

## 2019-10-11 MED ORDER — IPRATROPIUM-ALBUTEROL 0.5-2.5 (3) MG/3ML IN SOLN: 3.0000 mL | RESPIRATORY_TRACT | Status: DC

## 2019-10-11 MED ORDER — AEROCHAMBER PLUS FLO-VU LARGE MISC
1.0000 | Freq: Once | Status: AC
  Administered 2019-10-11: 06:00:00 1

## 2019-10-11 MED ORDER — ONDANSETRON HCL 4 MG/2ML IJ SOLN: 4.0000 mg | Freq: Four times a day (QID) | INTRAMUSCULAR | Status: DC | PRN

## 2019-10-11 MED ORDER — ONDANSETRON HCL 4 MG PO TABS: 4.0000 mg | ORAL_TABLET | Freq: Four times a day (QID) | ORAL | Status: DC | PRN

## 2019-10-11 MED ORDER — MAGNESIUM SULFATE 2 GM/50ML IV SOLN
2.0000 g | INTRAVENOUS | Status: AC
  Administered 2019-10-11: 2 g via INTRAVENOUS
  Filled 2019-10-11: qty 50

## 2019-10-11 MED ORDER — PANTOPRAZOLE SODIUM 40 MG PO TBEC
40.0000 mg | DELAYED_RELEASE_TABLET | Freq: Every day | ORAL | Status: DC
  Administered 2019-10-11 – 2019-10-16 (×6): 40 mg via ORAL
  Filled 2019-10-11 (×6): qty 1

## 2019-10-11 MED ORDER — ADULT MULTIVITAMIN W/MINERALS CH
1.0000 | ORAL_TABLET | Freq: Every day | ORAL | Status: DC
  Administered 2019-10-11 – 2019-10-16 (×6): 1 via ORAL
  Filled 2019-10-11 (×6): qty 1

## 2019-10-11 MED ORDER — ALBUTEROL SULFATE HFA 108 (90 BASE) MCG/ACT IN AERS
4.0000 | INHALATION_SPRAY | Freq: Once | RESPIRATORY_TRACT | Status: AC
  Administered 2019-10-11: 08:00:00 4 via RESPIRATORY_TRACT

## 2019-10-11 MED ORDER — PREDNISONE 20 MG PO TABS
40.0000 mg | ORAL_TABLET | Freq: Every day | ORAL | Status: DC
  Administered 2019-10-12: 40 mg via ORAL
  Filled 2019-10-11: qty 2

## 2019-10-11 MED ORDER — AZITHROMYCIN 500 MG PO TABS
500.0000 mg | ORAL_TABLET | Freq: Every day | ORAL | Status: DC
  Filled 2019-10-11: qty 1

## 2019-10-11 MED ORDER — LEVALBUTEROL HCL 1.25 MG/0.5ML IN NEBU
1.2500 mg | INHALATION_SOLUTION | Freq: Four times a day (QID) | RESPIRATORY_TRACT | Status: DC
  Administered 2019-10-11 (×2): 1.25 mg via RESPIRATORY_TRACT
  Filled 2019-10-11 (×3): qty 0.5

## 2019-10-11 NOTE — ED Notes (Signed)
Pt transported to CT ?

## 2019-10-11 NOTE — H&P (Addendum)
History and Physical    [name] UUV:[id number] DOB: 1965-11-10 DOA: 10/11/2019  Referring MD/NP/PA: Everardo Merritt PCP: Danny Plum, MD  Patient coming from: Home  Chief Complaint: Shortness of breath  I have personally briefly reviewed patient's old medical records in [facility]   HPI: [name] is a 54 y.o. male with medical history significant of asthma/COPD, hypertension, alcohol abuse, tobacco abuse, anxiety, and depression.  He presents with complaints of shortness of breath. He had been trying to use his inhalers without relief of symptoms.  Patient still continues to smoke 1 pack cigarettes per day on average and decline need for nicotine patch.  Associated symptoms include wheezing, some lower abdominal pain, and complaints of intermittent rectal bleeding over the last year.  Previously noted to have internal hemorrhoids on colonoscopy from 05/2019. Denies having any fevers, chills, cough, nausea, vomiting, or diarrhea.  He also notes drinking four 40 ounce beers per day on average and his last drink was yesterday.  He feels anxious and jittery every morning if he does not drink alcohol. Patient is scheduled to be reevaluated for back surgery on 2/23, due to back pain with radiculopathy down his left leg.  ED Course: On admission into the emergency department patient was noted to be afebrile, heart rate 80-118, O2 saturations as low as 87% on room air with improvement greater than 92% on 2 L of nasal cannula oxygen.  Labs significant for WBC 3.8, hemoglobin 12.9, platelets 126, AST 73, and ALT 37.  Stool guaiac was negative.  CT angiogram of the chest did not show any signs of an acute PE or any other acute abnormality. He had been given 125 mg of Solu-Medrol, 2 g of magnesium sulfate, and albuterol without relief of symptoms.  Influenza and COVID-19 screening negative.  [facility] called to admit.  Review of Systems  Constitutional: Positive for malaise/fatigue.  Negative for fever.  HENT: Negative for ear discharge and nosebleeds.   Eyes: Negative for photophobia and pain.  Respiratory: Positive for shortness of breath and wheezing. Negative for cough.   Cardiovascular: Negative for chest pain and leg swelling.  Gastrointestinal: Negative for abdominal pain and diarrhea.  Genitourinary: Negative for frequency.  Musculoskeletal: Positive for back pain.  Skin: Negative for itching.  Neurological: Positive for tingling and tremors. Negative for loss of consciousness.  Endo/Heme/Allergies: Negative for polydipsia. Does not bruise/bleed easily.  Psychiatric/Behavioral: Positive for substance abuse. The patient is nervous/anxious.     Past Medical History:  Diagnosis Date  . Alcohol abuse   . Allergy   . Anxiety   . Arthritis   . Asthma   . COPD (chronic obstructive pulmonary disease) (HCC)   . Depression   . Hypertension   . Shortness of breath     Past Surgical History:  Procedure Laterality Date  . BACK SURGERY    . BIOPSY  06/02/2019   Procedure: BIOPSY;  Surgeon: Kathi Der, MD;  Location: [facility];  Service: Gastroenterology;;  . COLONOSCOPY WITH PROPOFOL N/A 06/02/2019   Procedure: COLONOSCOPY WITH PROPOFOL;  Surgeon: Kathi Der, MD;  Location: [facility];  Service: Gastroenterology;  Laterality: N/A;  . ESOPHAGOGASTRODUODENOSCOPY (EGD) WITH PROPOFOL N/A 06/02/2019   Procedure: ESOPHAGOGASTRODUODENOSCOPY (EGD) WITH PROPOFOL;  Surgeon: Kathi Der, MD;  Location: [facility];  Service: Gastroenterology;  Laterality: N/A;  . HEMORRHOID SURGERY    . HERNIA REPAIR    . MANDIBLE FRACTURE SURGERY    . POLYPECTOMY  06/02/2019   Procedure: POLYPECTOMY;  Surgeon: Kathi Der, MD;  Location: [facility];  Service: Gastroenterology;;     reports that he has been smoking cigarettes. He has a 17.50 pack-year smoking history. He has never used smokeless tobacco. He reports current alcohol use of about 20.0  standard drinks of alcohol per week. He reports that he does not use drugs.  No Known Allergies  Family History  Problem Relation Age of Onset  . Hypertension Mother   . Hypertension Father   . Hypertension Brother     Prior to Admission medications   Medication Sig Start Date End Date Taking? Authorizing Provider  albuterol (PROVENTIL HFA;VENTOLIN HFA) 108 (90 Base) MCG/ACT inhaler Inhale 2 puffs into the lungs every 4 (four) hours as needed for wheezing or shortness of breath. 08/31/17  Yes Parrett, Tammy S, NP  Aspirin-Caffeine (BC FAST PAIN RELIEF PO) Take 1 packet by mouth every 6 (six) hours as needed (for headaches).   Yes [provider]  gabapentin (NEURONTIN) 300 MG capsule Take 300 mg by mouth 3 (three) times daily.  02/11/19  Yes [provider]  hydrocortisone (ANUSOL-HC) 25 MG suppository Place 1 suppository (25 mg total) rectally every 12 (twelve) hours. 06/02/19 06/01/20 Yes Brahmbhatt, Parag, MD  meloxicam (MOBIC) 7.5 MG tablet Take 7.5 mg by mouth daily.  01/09/19  Yes [provider]  pantoprazole (PROTONIX) 40 MG tablet Take 1 tablet (40 mg total) by mouth daily. 06/02/19 06/01/20 Yes Brahmbhatt, Parag, MD  tiZANidine (ZANAFLEX) 2 MG tablet Take 1-2 tablets by mouth every 8 (eight) hours as needed for muscle spasms.  01/08/19  Yes [provider]    Physical Exam:  Constitutional: Middle-age male who is acutely tremulous, but able to follow commands Vitals:   10/11/19 0930 10/11/19 1015 10/11/19 1030 10/11/19 1045  BP: 109/82 123/85 125/90 123/86  Pulse: (!) 111 (!) 118 (!) 114 (!) 115  Resp: 15 16 13 15   Temp:      TempSrc:      SpO2: 94% 98% 99% 97%   Eyes: PERRL, lids and conjunctivae normal ENMT: Mucous membranes are moist. Posterior pharynx clear of any exudate or lesions.  Neck: normal, supple, no masses, no thyromegaly Respiratory: Decreased overall aeration with mild expiratory wheeze appreciated.  Patient currently on 2  L nasal cannula oxygen with O2 saturations maintained. Cardiovascular: Tachycardic, no murmurs / rubs / gallops. No extremity edema. 2+ pedal pulses. No carotid bruits.  Abdomen: no tenderness, no masses palpated. No hepatosplenomegaly. Bowel sounds positive.  Musculoskeletal: Clubbing of hands appreciated.  No joint deformity upper and lower extremities. Good ROM, no contractures. Normal muscle tone.  Skin: no rashes, lesions, ulcers. No induration Neurologic: CN 2-12 grossly intact. Sensation intact, DTR normal. Strength 5/5 in all 4.  Patient noted to be tremulous.   Psychiatric: Normal judgment and insight. Alert and oriented x 3.  Anxious mood.     Labs on Admission: I have personally reviewed following labs and imaging studies  CBC: Recent Labs  Lab 10/11/19 0047  WBC 3.8*  HGB 12.9*  HCT 34.9*  MCV 98.0  PLT 126*   Basic Metabolic Panel: Recent Labs  Lab 10/11/19 0047  NA 135  K 3.5  CL 96*  CO2 26  GLUCOSE 80  BUN <5*  CREATININE 0.54*  CALCIUM 9.1   GFR: CrCl cannot be calculated (Unknown ideal weight.). Liver Function Tests: Recent Labs  Lab 10/11/19 0047  AST 73*  ALT 37  ALKPHOS 70  BILITOT 0.6  PROT 7.6  ALBUMIN 4.1   No results for input(s): LIPASE, AMYLASE in the last 168 hours. No results for input(s): AMMONIA in the last 168 hours. Coagulation Profile: No results for input(s): INR, PROTIME in the last 168 hours. Cardiac Enzymes: No results for input(s): CKTOTAL, CKMB, CKMBINDEX, TROPONINI in the last 168 hours. BNP (last 3 results) No results for input(s): PROBNP in the last 8760 hours. HbA1C: No results for input(s): HGBA1C in the last 72 hours. CBG: No results for input(s): GLUCAP in the last 168 hours. Lipid Profile: No results for input(s): CHOL, HDL, LDLCALC, TRIG, CHOLHDL, LDLDIRECT in the last 72 hours. Thyroid Function Tests: No results for input(s): TSH, T4TOTAL, FREET4, T3FREE, THYROIDAB in the last 72 hours. Anemia Panel: No  results for input(s): VITAMINB12, FOLATE, FERRITIN, TIBC, IRON, RETICCTPCT in the last 72 hours. Urine analysis:    Component Value Date/Time   COLORURINE YELLOW 06/19/2013 2025   APPEARANCEUR CLEAR 06/19/2013 2025   LABSPEC 1.015 06/19/2013 2025   PHURINE 5.5 06/19/2013 2025   GLUCOSEU NEGATIVE 06/19/2013 2025   HGBUR NEGATIVE 06/19/2013 2025   BILIRUBINUR NEGATIVE 06/19/2013 2025   KETONESUR NEGATIVE 06/19/2013 2025   PROTEINUR NEGATIVE 06/19/2013 2025   UROBILINOGEN 0.2 06/19/2013 2025   NITRITE NEGATIVE 06/19/2013 2025   LEUKOCYTESUR NEGATIVE 06/19/2013 2025   Sepsis Labs: No results found for this or any previous visit (from the past 240 hour(s)).   Radiological Exams on Admission: CT Angio Chest PE W and/or Wo Contrast  Result Date: 10/11/2019 CLINICAL DATA:  Shortness of breath for the last couple of days EXAM: CT ANGIOGRAPHY CHEST WITH CONTRAST TECHNIQUE: Multidetector CT imaging of the chest was performed using the standard protocol during bolus administration of intravenous contrast. Multiplanar CT image reconstructions and MIPs were obtained to evaluate the vascular anatomy. CONTRAST:  29mL OMNIPAQUE IOHEXOL 350 MG/ML SOLN COMPARISON:  08/08/2017 chest CT FINDINGS: Cardiovascular: Normal heart size. No pericardial effusion. No pulmonary artery filling defect. Negative aorta with minor atheromatous changes Mediastinum/Nodes: Negative for adenopathy or pneumomediastinum. Lungs/Pleura: Emphysema. Mild dependent atelectasis. Small nodular density at the right apex measuring 4 mm, stable. Stable focus of scarring in the right middle lobe. Upper Abdomen: Negative Musculoskeletal: No acute or aggressive finding Review of the MIP images confirms the above findings. IMPRESSION: 1. Negative for pulmonary embolism or other acute finding. 2. Emphysema and mild dependent atelectasis. Electronically Signed   By: Monte Fantasia M.D.   On: 10/11/2019 10:23    EKG: Independently reviewed.  Sinus  rhythm 81 bpm with ST elevations early repolarization previous tracing  Assessment/Plan Acute respiratory failure with hypoxia secondary to COPD exacerbation: Patient presents with progressively worsening shortness of breath.  CT angiogram of the chest did not note any signs of pulmonary embolus.  Patient had been given albuterol, 125 mg of Solu-Medrol IV, and 2 g of magnesium sulfate without improvement in symptoms. -Admit to a medical telemetry bed  -Continuous pulse oximetry with nasal cannula oxygen to maintain O2 saturation greater than 92%. -Levalbuterol nebs 4 times daily  -Prednisone 40 mg daily -Empiric antibiotics of azithromycin -Mucinex  Alcohol abuse with withdrawls: Patient reports continued alcohol abuse with his last drink occurring yesterday.  On average patient reports drinking four 40 ounce beers per day on average.  Seen to be tachycardic and jittery. -CIWA protocols with scheduled Ativan -Continue counseling on need of cessation of alcohol abuse  Rectal bleeding, history of internal hemorrhoid: Patient reported complaints of bleeding, stool guaiacs noted to be negative.  Last EGD/colonoscopy from  06/02/2019 revealed internal hemorrhoids, diminutive polyps, LA grade a esophagitis, small hiatal hernia, nonobstructing Schatzki ring, and gastritis. -Continue hydrocortisone suppository  Leukopenia: Acute on chronic.  WBC 3.8 on admission, and intermittently seen to be low in the past.  Unclear cause of symptoms. -Continue to monitor  Back pain with radiculopathy: Patient scheduled to be reevaluated for back surgery by neurosurgery on 2/23. -Continue gabapentin and muscle relaxants as needed  Thrombocytopenia: Acute on chronic.  Platelet count 126 on admission and intermittently low in the past.  Suspect related with patient's history of alcohol abuse. -Continue to monitor  Elevated AST: AST 73 with ALT 37.  The ratio of consistent with alcohol abuse.  GERD with  esophagitis, gastritis: As seen on egd/colonscopy on 05/2019. -Continue Protonix   Tobacco abuse: Patient smokes 1ppd of cigarettes on average. Declined nicotine patch. -Counseled on need of cessation of tobacco abuse   DVT prophylaxis: Lovenox Code Status: Full Family Communication: Discussed plan of care with sister over the phone Disposition Plan: Possible discharge home in 1- 2 days Consults called: None Admission status: Observation  Clydie Braun MD [facility] Pager [phone]   If 7PM-7AM, please contact night-coverage [email or web address] Password [email or web address]  10/11/2019, 11:09 AM

## 2019-10-11 NOTE — ED Provider Notes (Signed)
  Physical Exam  BP 114/79   Pulse 98   Temp 98.3 F (36.8 C) (Oral)   Resp 16   SpO2 98%   Physical Exam Vitals and nursing note reviewed.  Constitutional:      General: He is not in acute distress.    Appearance: Normal appearance. He is well-developed. He is not ill-appearing or diaphoretic.  HENT:     Head: Normocephalic.     Mouth/Throat:     Mouth: Mucous membranes are moist.  Eyes:     Conjunctiva/sclera: Conjunctivae normal.     Pupils: Pupils are equal, round, and reactive to light.  Cardiovascular:     Rate and Rhythm: Regular rhythm. Tachycardia present.  Pulmonary:     Effort: Pulmonary effort is normal.     Breath sounds: Wheezing and rhonchi present.  Musculoskeletal:     Right lower leg: No edema.     Left lower leg: No edema.  Skin:    General: Skin is warm and dry.  Neurological:     Mental Status: He is alert.     Comments: Tremor of bilateral UE  Psychiatric:        Mood and Affect: Mood normal. Mood is not anxious.        Behavior: Behavior normal. Behavior is not agitated.     ED Course/Procedures   Clinical Course as of Oct 11 642  Sat Oct 11, 2019  0705 On reassessment of the patient he is sleeping comfortably and reports feeling better. However, patient remaining tachycardic 110-128. This may be secondary to albuterol. However, CTA will be ordered to r/o PE. Patient also dropping 02 sats still after multiple albuterol treatments, magnesium. Does not have any recent admissions for COPD in the last 3 years at least. Will likely need admission today   [KM]  1020 CTA is pending.  Continues to be stable but tachycardic.  Tremor noted on my reassessment.  Patient has history of alcohol abuse and reports he tremors when he does not have beer.  Last drink was sometimes last night.  Denies any history of withdrawal seizures.   [KM]  1113 Spoke with Dr. Katrinka Blazing for admission of the patient at this time for COPD exacerbation as well as alcohol withdrawal  which is currently mild and controlled with ativan.   [KM]    Clinical Course User Index [KM] Arlyn Dunning, PA-C    Procedures  MDM  Patient care assumed from Roxy Horseman PA due to change of shift. Patient with hx of COPD presenting with SOB. Also complains of rectal bleeding for months. Labs at baseline and negative hemoccult. Patient given solumedrol, albuterol, magnesium and will reassess. If improved may be d/c, otherwise admit for COPD flare up. Oxygen sats dropped as low as 88% on RA. No home oxygen in the past.       Jeral Pinch 10/12/19 0645    Tilden Fossa, MD 10/12/19 [phone]

## 2019-10-11 NOTE — Progress Notes (Signed)
Transferred patient to a progressive bed due to severity of possible alcohol withdrawals and tachycardia.

## 2019-10-11 NOTE — ED Notes (Signed)
Patient was lightheaded and short of breath during orthostatic vital signs. Patient was 1 assisted standing and sitting.

## 2019-10-11 NOTE — ED Notes (Signed)
Gave patient a urinal patient is resting with call bell in reach 

## 2019-10-11 NOTE — ED Triage Notes (Signed)
Pt says that he has back problems "all the time", gets shots for his pain, and is suppose to have surgery. Says he has been having on and off bright red blood stools for about 6-7 months. Also reporting SOB and cough "that is not new" that he uses his inhaler, hx of COPD. Also says that his blood pressure was elevated when his sister took it tonight. No acute distress.

## 2019-10-11 NOTE — ED Notes (Signed)
Lunch Tray Ordered @ 1145.

## 2019-10-11 NOTE — ED Provider Notes (Signed)
[facility] EMERGENCY DEPARTMENT Provider Note   CSN: [id number] Arrival date & time: 10/11/19  0031     History Chief Complaint  Patient presents with  . Rectal Bleeding  . Shortness of Breath    [name] is a 55 y.o. male.  Patient with past medical history notable for COPD, presents to the emergency department with a chief complaint of shortness of breath.  He also reports intermittent episodes of rectal bleeding x1 year.  He states that he would like to see evaluated for his rectal bleeding as well as for his shortness of breath.  He states that his shortness of breath feels similar to prior COPD exacerbations.  He denies any fever, chills, productive cough.  Denies any known exposures to coronavirus.  He states that he has been using his inhaler as directed.  Regarding his rectal bleeding, he states that this is been intermittent for longer than a year.  He denies any lower abdominal pain.  States he has a history of hemorrhoids.  He is not anticoagulated.  The history is provided by the patient. No language interpreter was used.       Past Medical History:  Diagnosis Date  . Alcohol abuse   . Allergy   . Anxiety   . Arthritis   . Asthma   . COPD (chronic obstructive pulmonary disease) ([place])   . Depression   . Hypertension   . Shortness of breath     Patient Active Problem List   Diagnosis Date Noted  . Preoperative clearance 08/31/2017  . COPD (chronic obstructive pulmonary disease) ([place]) 11/17/2015  . Alcohol dependence with uncomplicated withdrawal ([place]) 06/29/2014  . S/P alcohol detoxification 01/20/2014  . Pain of right thigh 01/20/2014  . Salicylate overdose 41/66/0630  . Alcohol abuse, continuous 09/24/2012    Class: Acute  . Alcohol dependence ([place]) 09/24/2012  . Alcohol withdrawal ([place]) 09/24/2012    Past Surgical History:  Procedure Laterality Date  . BACK SURGERY    . BIOPSY  06/02/2019   Procedure: BIOPSY;  Surgeon:  Otis Brace, MD;  Location: [facility];  Service: Gastroenterology;;  . COLONOSCOPY WITH PROPOFOL N/A 06/02/2019   Procedure: COLONOSCOPY WITH PROPOFOL;  Surgeon: Otis Brace, MD;  Location: [facility];  Service: Gastroenterology;  Laterality: N/A;  . ESOPHAGOGASTRODUODENOSCOPY (EGD) WITH PROPOFOL N/A 06/02/2019   Procedure: ESOPHAGOGASTRODUODENOSCOPY (EGD) WITH PROPOFOL;  Surgeon: Otis Brace, MD;  Location: [facility];  Service: Gastroenterology;  Laterality: N/A;  . HEMORRHOID SURGERY    . HERNIA REPAIR    . MANDIBLE FRACTURE SURGERY    . POLYPECTOMY  06/02/2019   Procedure: POLYPECTOMY;  Surgeon: Otis Brace, MD;  Location: [facility];  Service: Gastroenterology;;       Family History  Problem Relation Age of Onset  . Hypertension Mother   . Hypertension Father   . Hypertension Brother     Social History   Tobacco Use  . Smoking status: Current Every Day Smoker    Packs/day: 0.50    Years: 35.00    Pack years: 17.50    Types: Cigarettes  . Smokeless tobacco: Never Used  . Tobacco comment: currently smoking 1ppd as of 08/07/17 ep  Substance Use Topics  . Alcohol use: Yes    Alcohol/week: 20.0 standard drinks    Types: 20 Cans of beer per week    Comment: 3-4 beers daily  . Drug use: No    Home Medications Prior to Admission medications   Medication Sig Start  Date End Date Taking? Authorizing Provider  albuterol (PROVENTIL HFA;VENTOLIN HFA) 108 (90 Base) MCG/ACT inhaler Inhale 2 puffs into the lungs every 4 (four) hours as needed for wheezing or shortness of breath. 08/31/17   Parrett, Virgel Bouquet, NP  Aspirin-Caffeine (BC FAST PAIN RELIEF PO) Take 1 packet by mouth every 6 (six) hours as needed (for headaches).    [provider]  gabapentin (NEURONTIN) 300 MG capsule Take 1 capsule by mouth 3 (three) times daily. 02/11/19   [provider]  hydrocortisone (ANUSOL-HC) 25 MG suppository Place 1 suppository (25 mg total)  rectally every 12 (twelve) hours. 06/02/19 06/01/20  Kathi Der, MD  meloxicam (MOBIC) 7.5 MG tablet Take 1-2 tablets by mouth daily. 01/09/19   [provider]  pantoprazole (PROTONIX) 40 MG tablet Take 1 tablet (40 mg total) by mouth daily. 06/02/19 06/01/20  Kathi Der, MD  tiZANidine (ZANAFLEX) 2 MG tablet Take 1-2 tablets by mouth every 8 (eight) hours as needed. 01/08/19   [provider]    Allergies    Patient has no known allergies.  Review of Systems   Review of Systems  All other systems reviewed and are negative.   Physical Exam Updated Vital Signs BP 114/86 (BP Location: Right Arm)   Pulse (!) 111   Temp 98.3 F (36.8 C) (Oral)   Resp 13   SpO2 96%   Physical Exam Vitals and nursing note reviewed.  Constitutional:      Appearance: He is well-developed.  HENT:     Head: Normocephalic and atraumatic.  Eyes:     Conjunctiva/sclera: Conjunctivae normal.  Cardiovascular:     Rate and Rhythm: Regular rhythm.     Heart sounds: No murmur.     Comments: Tachycardic Pulmonary:     Effort: Pulmonary effort is normal. No respiratory distress.     Breath sounds: Wheezing present.  Abdominal:     Palpations: Abdomen is soft.     Tenderness: There is no abdominal tenderness.  Genitourinary:    Comments: DRE notable for not bleeding, no stool in rectal vault, no visible or palpable hemorrhoids Musculoskeletal:        General: Normal range of motion.     Cervical back: Neck supple.  Skin:    General: Skin is warm and dry.  Neurological:     Mental Status: He is alert and oriented to person, place, and time.  Psychiatric:        Mood and Affect: Mood normal.        Behavior: Behavior normal.     ED Results / Procedures / Treatments   Labs (all labs ordered are listed, but only abnormal results are displayed) Labs Reviewed  COMPREHENSIVE METABOLIC PANEL - Abnormal; Notable for the following components:      Result Value   Chloride  96 (*)    BUN <5 (*)    Creatinine, Ser 0.54 (*)    AST 73 (*)    All other components within normal limits  CBC - Abnormal; Notable for the following components:   WBC 3.8 (*)    RBC 3.56 (*)    Hemoglobin 12.9 (*)    HCT 34.9 (*)    MCH 36.2 (*)    MCHC 37.0 (*)    Platelets 126 (*)    All other components within normal limits  POC OCCULT BLOOD, ED  TYPE AND SCREEN  ABO/RH    EKG EKG Interpretation  Date/Time:  Saturday October 11 2019 04:31:25 EST  Ventricular Rate:  81 PR Interval:    QRS Duration: 92 QT Interval:  395 QTC Calculation: 459 R Axis:   53 Text Interpretation: Sinus rhythm ST elev, probable normal early repol pattern No significant change since last tracing Confirmed by Zadie Rhine ([id number]) on 10/11/2019 4:34:45 AM   Radiology No results found.  Procedures Procedures (including critical care time)  Medications Ordered in ED Medications  methylPREDNISolone sodium succinate (SOLU-MEDROL) 125 mg/2 mL injection 125 mg (has no administration in time range)  albuterol (VENTOLIN HFA) 108 (90 Base) MCG/ACT inhaler 8 puff (has no administration in time range)  AeroChamber Plus Flo-Vu Large MISC 1 each (has no administration in time range)    ED Course  I have reviewed the triage vital signs and the nursing notes.  Pertinent labs & imaging results that were available during my care of the patient were reviewed by me and considered in my medical decision making (see chart for details).    MDM Rules/Calculators/A&P                      Patient with shortness of breath and fatigue.  He has COPD.  States that this feels like COPD exacerbation.  He does have diffuse wheezing bilaterally.  Will give Solu-Medrol and inhaler.  Regarding patient's intermittent rectal bleeding x1 year, I recommend outpatient follow-up.  He is Hemoccult negative today.  He has no concerning findings on rectal exam.  H&H is stable.  6:10 AM Patient reassessed.  O2 sat 89% on  RA.  Will give another round of albuterol through inhaler.   He remains very wheezy, but doesn't appear to be in any distress.  Will give some mag as well.  Patient signed out to oncoming team, Burnard Bunting, who will continue care.   Final Clinical Impression(s) / ED Diagnoses Final diagnoses:  None    Rx / DC Orders ED Discharge Orders    None       Roxy Horseman, PA-C 10/11/19 [id number]    Zadie Rhine, MD 10/15/19 [phone]

## 2019-10-11 NOTE — ED Notes (Signed)
Tremors observed while handing pt the phone to call his sister,  this nurse notified ED provider of observation.

## 2019-10-11 NOTE — Progress Notes (Addendum)
Report received from RN. Concerns presented: Pt MEWS score 5. Pt HR 137-140's per documentation and/or reported by nurse. Nurse reports pt was actively shaking prior to ativan administration. Nurse reports that pt was refusing breathing treatments though shortness of breath noted. Discussed with charge nurse. Request for nurse to maintain patient until charge nurse is available to assess.

## 2019-10-11 NOTE — ED Notes (Signed)
This writer witnessed dose of Ativan for PT.

## 2019-10-11 NOTE — ED Triage Notes (Signed)
Pt arrives via [facility] from home with reports by EMS of  c/o chronic back pain. Also having intermittent rectal bleeding for 3-5 years. ETOH today. 148/88, hr 80, 18 r, 100% RA.

## 2019-10-12 DIAGNOSIS — R000 Tachycardia, unspecified: Secondary | ICD-10-CM | POA: Diagnosis present

## 2019-10-12 DIAGNOSIS — D61818 Other pancytopenia: Secondary | ICD-10-CM | POA: Diagnosis not present

## 2019-10-12 DIAGNOSIS — F10939 Alcohol use, unspecified with withdrawal, unspecified: Secondary | ICD-10-CM | POA: Diagnosis present

## 2019-10-12 DIAGNOSIS — F329 Major depressive disorder, single episode, unspecified: Secondary | ICD-10-CM | POA: Diagnosis present

## 2019-10-12 DIAGNOSIS — J302 Other seasonal allergic rhinitis: Secondary | ICD-10-CM | POA: Diagnosis present

## 2019-10-12 DIAGNOSIS — J449 Chronic obstructive pulmonary disease, unspecified: Secondary | ICD-10-CM | POA: Diagnosis not present

## 2019-10-12 DIAGNOSIS — E440 Moderate protein-calorie malnutrition: Secondary | ICD-10-CM | POA: Diagnosis not present

## 2019-10-12 DIAGNOSIS — J441 Chronic obstructive pulmonary disease with (acute) exacerbation: Secondary | ICD-10-CM

## 2019-10-12 DIAGNOSIS — F10231 Alcohol dependence with withdrawal delirium: Secondary | ICD-10-CM | POA: Diagnosis not present

## 2019-10-12 DIAGNOSIS — F1010 Alcohol abuse, uncomplicated: Secondary | ICD-10-CM | POA: Diagnosis not present

## 2019-10-12 DIAGNOSIS — G9341 Metabolic encephalopathy: Secondary | ICD-10-CM

## 2019-10-12 DIAGNOSIS — J9601 Acute respiratory failure with hypoxia: Secondary | ICD-10-CM | POA: Diagnosis present

## 2019-10-12 DIAGNOSIS — Z8249 Family history of ischemic heart disease and other diseases of the circulatory system: Secondary | ICD-10-CM | POA: Diagnosis not present

## 2019-10-12 DIAGNOSIS — F10239 Alcohol dependence with withdrawal, unspecified: Secondary | ICD-10-CM | POA: Diagnosis present

## 2019-10-12 DIAGNOSIS — Z20822 Contact with and (suspected) exposure to covid-19: Secondary | ICD-10-CM | POA: Diagnosis present

## 2019-10-12 DIAGNOSIS — R748 Abnormal levels of other serum enzymes: Secondary | ICD-10-CM

## 2019-10-12 DIAGNOSIS — I10 Essential (primary) hypertension: Secondary | ICD-10-CM | POA: Diagnosis present

## 2019-10-12 DIAGNOSIS — R451 Restlessness and agitation: Secondary | ICD-10-CM | POA: Diagnosis not present

## 2019-10-12 DIAGNOSIS — F17210 Nicotine dependence, cigarettes, uncomplicated: Secondary | ICD-10-CM | POA: Diagnosis present

## 2019-10-12 DIAGNOSIS — D696 Thrombocytopenia, unspecified: Secondary | ICD-10-CM | POA: Diagnosis not present

## 2019-10-12 DIAGNOSIS — G8929 Other chronic pain: Secondary | ICD-10-CM | POA: Diagnosis present

## 2019-10-12 DIAGNOSIS — R339 Retention of urine, unspecified: Secondary | ICD-10-CM | POA: Diagnosis not present

## 2019-10-12 DIAGNOSIS — F419 Anxiety disorder, unspecified: Secondary | ICD-10-CM | POA: Diagnosis present

## 2019-10-12 DIAGNOSIS — E876 Hypokalemia: Secondary | ICD-10-CM | POA: Diagnosis present

## 2019-10-12 DIAGNOSIS — K2100 Gastro-esophageal reflux disease with esophagitis, without bleeding: Secondary | ICD-10-CM | POA: Diagnosis present

## 2019-10-12 DIAGNOSIS — R5381 Other malaise: Secondary | ICD-10-CM | POA: Diagnosis present

## 2019-10-12 DIAGNOSIS — Z7982 Long term (current) use of aspirin: Secondary | ICD-10-CM | POA: Diagnosis not present

## 2019-10-12 DIAGNOSIS — M5410 Radiculopathy, site unspecified: Secondary | ICD-10-CM | POA: Diagnosis present

## 2019-10-12 DIAGNOSIS — Z720 Tobacco use: Secondary | ICD-10-CM | POA: Diagnosis not present

## 2019-10-12 DIAGNOSIS — R7401 Elevation of levels of liver transaminase levels: Secondary | ICD-10-CM | POA: Diagnosis present

## 2019-10-12 LAB — FERRITIN: Ferritin: 73 ng/mL (ref 24–336)

## 2019-10-12 LAB — RETICULOCYTES
Immature Retic Fract: 16.5 % — ABNORMAL HIGH (ref 2.3–15.9)
RBC.: 3.45 MIL/uL — ABNORMAL LOW (ref 4.22–5.81)
Retic Count, Absolute: 59 10*3/uL (ref 19.0–186.0)
Retic Ct Pct: 1.7 % (ref 0.4–3.1)

## 2019-10-12 LAB — GLUCOSE, CAPILLARY: Glucose-Capillary: 121 mg/dL — ABNORMAL HIGH (ref 70–99)

## 2019-10-12 LAB — BASIC METABOLIC PANEL
Anion gap: 10 (ref 5–15)
BUN: 8 mg/dL (ref 6–20)
CO2: 24 mmol/L (ref 22–32)
Calcium: 9.3 mg/dL (ref 8.9–10.3)
Chloride: 102 mmol/L (ref 98–111)
Creatinine, Ser: 0.58 mg/dL — ABNORMAL LOW (ref 0.61–1.24)
GFR calc Af Amer: >60 mL/min (ref >60)
GFR calc non Af Amer: >60 mL/min (ref >60)
Glucose, Bld: 94 mg/dL (ref 70–99)
Potassium: 3.5 mmol/L (ref 3.5–5.1)
Sodium: 136 mmol/L (ref 135–145)

## 2019-10-12 LAB — CBC
HCT: 32.2 % — ABNORMAL LOW (ref 39.0–52.0)
Hemoglobin: 11.9 g/dL — ABNORMAL LOW (ref 13.0–17.0)
MCH: 36.5 pg — ABNORMAL HIGH (ref 26.0–34.0)
MCHC: 37 g/dL — ABNORMAL HIGH (ref 30.0–36.0)
MCV: 98.8 fL (ref 80.0–100.0)
Platelets: 115 10*3/uL — ABNORMAL LOW (ref 150–400)
RBC: 3.26 MIL/uL — ABNORMAL LOW (ref 4.22–5.81)
RDW: 15.2 % (ref 11.5–15.5)
WBC: 4.6 10*3/uL (ref 4.0–10.5)
nRBC: 0 % (ref 0.0–0.2)

## 2019-10-12 LAB — IRON AND TIBC
Iron: 97 ug/dL (ref 45–182)
Saturation Ratios: 23 % (ref 17.9–39.5)
TIBC: 427 ug/dL (ref 250–450)
UIBC: 330 ug/dL

## 2019-10-12 LAB — HIV ANTIBODY (ROUTINE TESTING W REFLEX): HIV Screen 4th Generation wRfx: NONREACTIVE — AB

## 2019-10-12 LAB — PHOSPHORUS: Phosphorus: 3.5 mg/dL (ref 2.5–4.6)

## 2019-10-12 LAB — FOLATE: Folate: 65.4 ng/mL (ref >5.9)

## 2019-10-12 LAB — MRSA PCR SCREENING

## 2019-10-12 LAB — MAGNESIUM: Magnesium: 2.1 mg/dL (ref 1.7–2.4)

## 2019-10-12 LAB — VITAMIN B12: Vitamin B-12: 720 pg/mL (ref 180–914)

## 2019-10-12 MED ORDER — CHLORDIAZEPOXIDE HCL 25 MG PO CAPS: 25.0000 mg | ORAL_CAPSULE | Freq: Four times a day (QID) | ORAL | Status: DC

## 2019-10-12 MED ORDER — PHENOBARBITAL 32.4 MG PO TABS: 32.4000 mg | ORAL_TABLET | Freq: Three times a day (TID) | ORAL | Status: DC

## 2019-10-12 MED ORDER — IPRATROPIUM BROMIDE 0.02 % IN SOLN: 0.5000 mg | Freq: Four times a day (QID) | RESPIRATORY_TRACT | Status: DC | PRN

## 2019-10-12 MED ORDER — DEXMEDETOMIDINE HCL IN NACL 400 MCG/100ML IV SOLN
0.4000 ug/kg/h | INTRAVENOUS | Status: DC
  Administered 2019-10-12: 13:00:00 1 ug/kg/h via INTRAVENOUS
  Administered 2019-10-12: 0.8 ug/kg/h via INTRAVENOUS
  Filled 2019-10-12 (×3): qty 100

## 2019-10-12 MED ORDER — LEVALBUTEROL HCL 1.25 MG/0.5ML IN NEBU
1.2500 mg | INHALATION_SOLUTION | Freq: Four times a day (QID) | RESPIRATORY_TRACT | Status: DC | PRN
  Filled 2019-10-12: qty 0.5

## 2019-10-12 MED ORDER — PHENOBARBITAL 16.2 MG PO TABS: 16.2000 mg | ORAL_TABLET | Freq: Three times a day (TID) | ORAL | Status: DC

## 2019-10-12 MED ORDER — CHLORDIAZEPOXIDE HCL 25 MG PO CAPS: 25.0000 mg | ORAL_CAPSULE | Freq: Every day | ORAL | Status: DC

## 2019-10-12 MED ORDER — CHLORDIAZEPOXIDE HCL 25 MG PO CAPS: 25.0000 mg | ORAL_CAPSULE | Freq: Three times a day (TID) | ORAL | Status: DC

## 2019-10-12 MED ORDER — CHLORDIAZEPOXIDE HCL 25 MG PO CAPS: 25.0000 mg | ORAL_CAPSULE | ORAL | Status: DC

## 2019-10-12 MED ORDER — CHLORHEXIDINE GLUCONATE CLOTH 2 % EX PADS
6.0000 | MEDICATED_PAD | Freq: Every day | CUTANEOUS | Status: DC
  Administered 2019-10-12 – 2019-10-15 (×4): 6 via TOPICAL

## 2019-10-12 MED ORDER — PHENOBARBITAL 32.4 MG PO TABS
64.8000 mg | ORAL_TABLET | Freq: Three times a day (TID) | ORAL | Status: DC
  Administered 2019-10-14 – 2019-10-16 (×5): 64.8 mg via ORAL
  Filled 2019-10-12 (×5): qty 2

## 2019-10-12 MED ORDER — NICOTINE 14 MG/24HR TD PT24
14.0000 mg | MEDICATED_PATCH | Freq: Every day | TRANSDERMAL | Status: DC
  Administered 2019-10-12 – 2019-10-16 (×5): 14 mg via TRANSDERMAL
  Filled 2019-10-12 (×5): qty 1

## 2019-10-12 MED ORDER — TIZANIDINE HCL 2 MG PO TABS
2.0000 mg | ORAL_TABLET | Freq: Three times a day (TID) | ORAL | Status: DC | PRN
  Administered 2019-10-14 – 2019-10-15 (×2): 2 mg via ORAL
  Filled 2019-10-12 (×4): qty 1

## 2019-10-12 MED ORDER — SODIUM CHLORIDE 0.9 % IV SOLN
500.0000 mg | INTRAVENOUS | Status: DC
  Administered 2019-10-13 – 2019-10-14 (×2): 500 mg via INTRAVENOUS
  Filled 2019-10-12 (×3): qty 500

## 2019-10-12 MED ORDER — IPRATROPIUM BROMIDE 0.02 % IN SOLN
0.5000 mg | Freq: Three times a day (TID) | RESPIRATORY_TRACT | Status: DC
  Administered 2019-10-12: 0.5 mg via RESPIRATORY_TRACT
  Filled 2019-10-12 (×2): qty 2.5

## 2019-10-12 MED ORDER — PHENOBARBITAL 97.2 MG PO TABS
97.2000 mg | ORAL_TABLET | Freq: Three times a day (TID) | ORAL | Status: AC
  Administered 2019-10-12 – 2019-10-14 (×6): 97.2 mg via ORAL
  Filled 2019-10-12 (×6): qty 1

## 2019-10-12 MED ORDER — CHLORDIAZEPOXIDE HCL 5 MG PO CAPS
50.0000 mg | ORAL_CAPSULE | Freq: Once | ORAL | Status: AC
  Administered 2019-10-12: 50 mg via ORAL
  Filled 2019-10-12: qty 10

## 2019-10-12 MED ORDER — IPRATROPIUM-ALBUTEROL 0.5-2.5 (3) MG/3ML IN SOLN: 3.0000 mL | RESPIRATORY_TRACT | Status: DC | PRN

## 2019-10-12 MED ORDER — LEVALBUTEROL HCL 1.25 MG/0.5ML IN NEBU: 1.2500 mg | INHALATION_SOLUTION | Freq: Two times a day (BID) | RESPIRATORY_TRACT | Status: DC

## 2019-10-12 MED ORDER — PHENOBARBITAL SODIUM 130 MG/ML IJ SOLN
200.0000 mg | Freq: Once | INTRAMUSCULAR | Status: DC
  Filled 2019-10-12: qty 1.54

## 2019-10-12 MED ORDER — LEVALBUTEROL HCL 1.25 MG/0.5ML IN NEBU
1.2500 mg | INHALATION_SOLUTION | Freq: Three times a day (TID) | RESPIRATORY_TRACT | Status: DC
  Administered 2019-10-12: 1.25 mg via RESPIRATORY_TRACT
  Filled 2019-10-12 (×3): qty 0.5

## 2019-10-12 NOTE — Progress Notes (Signed)
Pt very agitated, anxious and confused. Constantly attempting to get out of bed and leave hospital. He is not physically aggressive and can be redirected which takes time. He would benefit from having sitter at the bedside.  Pt also asking for cigarette.  [place], NP paged and notified. Will continue to monitor pt closely.

## 2019-10-12 NOTE — Progress Notes (Signed)
PT Cancellation Note  Patient Details Name: [name] MRN: [id number] DOB: Dec 23, 1965   Cancelled Treatment:    Reason Eval/Treat Not Completed: Patient not medically ready  CIWA 20 with "resting" HR 127.  Will reattempt as appropriate.  Van Clines, [place]  Acute Rehabilitation Services Pager [phone] Office [phone]    Levi Aland 10/12/2019, 10:49 AM

## 2019-10-12 NOTE — Progress Notes (Signed)
OT Cancellation Note  Patient Details Name: XIAN ALVES MRN: [id number] DOB: 04/20/1966   Cancelled Treatment:    Reason Eval/Treat Not Completed: Medical issues which prohibited therapy.  CIWA 20 with "resting" HR 127.  Will reattempt as appropriate.  Eber Jones., OTR/L Acute Rehabilitation Services Pager [phone] Office [phone]   Jeani Hawking M 10/12/2019, 10:42 AM

## 2019-10-12 NOTE — Consult Note (Signed)
PULMONARY / CRITICAL CARE MEDICINE   NAME:  [name], MRN:  [id number], DOB:  01-29-1966, LOS: 0 ADMISSION DATE:  10/11/2019, CONSULTATION DATE:  10/12/2019  REFERRING MD:  Alanda Slim, MD, CHIEF COMPLAINT:  Severe agitation  BRIEF HISTORY:    54 year old smoker, EtOH user, admitted for COPD exacerbation and developed severe alcohol withdrawal within 1 day of admission requiring transfer to ICU HISTORY OF PRESENT ILLNESS   54 year old smoker and drinker, admitted with shortness of breath, found to have a saturation of 87% on room air requiring 2 L.  CT angiogram chest was negative for PE.  He has been treated with azithromycin and steroids.  He reportedly drinks 10 to 12 cans of beer daily and smokes about a pack a day He developed severe agitation on 2/21, overnight has required about 40 mg of Ativan, about 12 mg of Ativan since 7 AM this morning per his bedside RN.  On my arrival he is restrained with 2 people holding him down and he is still trying to get out of bed. Exam at 10 AM "tremulous and confused"  SIGNIFICANT PAST MEDICAL HISTORY   COPD/emphysema Upper GI bleed -EGD showing grade a esophagitis and gastritis  SIGNIFICANT EVENTS:  2/21 >> transferred to ICU for agitated withdrawal  STUDIES:   CT angiogram chest 2/20 >> negative PE, emphysema CULTURES:    ANTIBIOTICS:  azithro 2/18 >>  LINES/TUBES:    CONSULTANTS:   SUBJECTIVE:    CONSTITUTIONAL: BP 133/86 (BP Location: Left Arm)   Pulse 90   Temp 98.7 F (37.1 C) (Oral)   Resp 17   Ht 5\' 5"  (1.651 m) Comment: pt stated  Wt 55.5 kg   SpO2 100%   BMI 20.36 kg/m   I/O last 3 completed shifts: In: 240 [P.O.:240] Out: 1300 [Urine:1300]        PHYSICAL EXAM: General: Middle-age man, agitated, four-point restraints and trying to jump out of bed Neuro: Agitated, grossly nonfocal, unable to sustain attention but does respond to simple commands HEENT: No JVD, no icterus Cardiovascular: S1-S2 tacky Lungs:  Decreased breath sounds with faint rhonchi on the right Abdomen: Soft, nontender no organomegaly Musculoskeletal: No deformity Skin: No rash or petechiae  RESOLVED PROBLEM LIST   ASSESSMENT AND PLAN   He seems to be in severely agitated delirium, has required high doses of Ativan including 12 to 16 mg since 7 AM and is now requiring four-point restraints  Acute metabolic encephalopathy Alcohol withdrawal/agitated delirium -Transferred to ICU and placed on Precedex with ceiling of 1.2 -Titrate to RASS +1 to -1 -Use Ativan 1 to 4 mg for breakthrough -Librium taper will be continued  COPD exacerbation -continue bronchodilator nebs. Since bronchospasm is mostly resolved, will discontinue steroids -Continue oral Zithromax for 5 days total  Mild thrombocytopenia and elevated liver enzymes-related to alcohol Monitor  SUMMARY OF TODAY'S PLAN:  Transferred to ICU for Precedex  Best Practice / Goals of Care / Disposition.   DVT PROPHYLAXIS: Lovenox SUP: Protonix NUTRITION: N.p.o. for now, advance as tolerated MOBILITY: BR GOALS OF CARE: N/A FAMILY DISCUSSIONS: None at bedside DISPOSITION to ICU  LABS  Glucose No results for input(s): GLUCAP in the last 168 hours.  BMET Recent Labs  Lab 10/11/19 0047 10/12/19 0248  NA 135 136  K 3.5 3.5  CL 96* 102  CO2 26 24  BUN <5* 8  CREATININE 0.54* 0.58*  GLUCOSE 80 94    Liver Enzymes Recent Labs  Lab 10/11/19 0047  AST 73*  ALT 37  ALKPHOS 70  BILITOT 0.6  ALBUMIN 4.1    Electrolytes Recent Labs  Lab 10/11/19 0047 10/12/19 0248  CALCIUM 9.1 9.3  MG  --  2.1  PHOS 4.1 3.5    CBC Recent Labs  Lab 10/11/19 0047 10/12/19 0248  WBC 3.8* 4.6  HGB 12.9* 11.9*  HCT 34.9* 32.2*  PLT 126* 115*    ABG No results for input(s): PHART, PCO2ART, PO2ART in the last 168 hours.  Coag's No results for input(s): APTT, INR in the last 168 hours.  Sepsis Markers No results for input(s): LATICACIDVEN, PROCALCITON,  O2SATVEN in the last 168 hours.  Cardiac Enzymes No results for input(s): TROPONINI, PROBNP in the last 168 hours.  PAST MEDICAL HISTORY :   He  has a past medical history of Alcohol abuse, Allergy, Anxiety, Arthritis, Asthma, COPD (chronic obstructive pulmonary disease) ([place]), Depression, Hypertension, and Shortness of breath.  PAST SURGICAL HISTORY:  He  has a past surgical history that includes Hemorrhoid surgery; Mandible fracture surgery; Back surgery; Hernia repair; Esophagogastroduodenoscopy (egd) with propofol (N/A, 06/02/2019); Colonoscopy with propofol (N/A, 06/02/2019); biopsy (06/02/2019); and polypectomy (06/02/2019).  No Known Allergies  No current facility-administered medications on file prior to encounter.   Current Outpatient Medications on File Prior to Encounter  Medication Sig  . albuterol (PROVENTIL HFA;VENTOLIN HFA) 108 (90 Base) MCG/ACT inhaler Inhale 2 puffs into the lungs every 4 (four) hours as needed for wheezing or shortness of breath.  . Aspirin-Caffeine (BC FAST PAIN RELIEF PO) Take 1 packet by mouth every 6 (six) hours as needed (for headaches).  . gabapentin (NEURONTIN) 300 MG capsule Take 300 mg by mouth 3 (three) times daily.   . hydrocortisone (ANUSOL-HC) 25 MG suppository Place 1 suppository (25 mg total) rectally every 12 (twelve) hours.  . meloxicam (MOBIC) 7.5 MG tablet Take 7.5 mg by mouth daily.   . pantoprazole (PROTONIX) 40 MG tablet Take 1 tablet (40 mg total) by mouth daily.  Marland Kitchen tiZANidine (ZANAFLEX) 2 MG tablet Take 1-2 tablets by mouth every 8 (eight) hours as needed for muscle spasms.     FAMILY HISTORY:   His family history includes Hypertension in his brother, father, and mother.  SOCIAL HISTORY:  He  reports that he has been smoking cigarettes. He has a 17.50 pack-year smoking history. He has never used smokeless tobacco. He reports current alcohol use of about 20.0 standard drinks of alcohol per week. He reports that he does not use  drugs.  REVIEW OF SYSTEMS:    Unable to obtain since agitated  Kara Mead MD. [facility]. [facility]  If no response to pager , please call [phone]   10/12/2019

## 2019-10-12 NOTE — Progress Notes (Signed)
PROGRESS NOTE  [name] ZOX:[id number] DOB: December 18, 1965   PCP: Benito Mccreedy, MD  Patient is from: Home  DOA: 10/11/2019 LOS: 0  Brief Narrative / Interim history: 54 year old male with history of asthma/COPD, alcohol and tobacco abuse, anxiety and depression, and HTN presenting with complaints of shortness of breath and admitted with acute respiratory failure due to COPD exacerbation and alcohol withdrawal.  Reportedly drinks about 160 ounce of beers a day, and smokes about a pack a day.  He was hypoxic to 87% on RA requiring 2 L on admission.  CTA chest negative for PE or any acute finding.  Influenza and COVID-19 PCR negative.  LFT mildly elevated.   Subjective: Patient was agitated and combative overnight.  Safety sitter ordered.  CIWA scores elevated to 20 despite as needed Ativan this morning.  Continues to be tremulous.  Slightly tachycardic and hypertensive this morning.  Denies pain and shortness of breath but not a reliable historian.  He is oriented to self.  Able to tell me that he is at [facility] after a long pause.  He is tremulous and confused.  Objective: Vitals:   10/12/19 0422 10/12/19 0500 10/12/19 0635 10/12/19 0838  BP: 134/78 (!) 141/94 137/84 (!) 168/97  Pulse:    (!) 103  Resp:      Temp:      TempSrc:      SpO2:    92%  Weight:      Height:        Intake/Output Summary (Last 24 hours) at 10/12/2019 1007 Last data filed at 10/12/2019 0600 Gross per 24 hour  Intake 240 ml  Output 1300 ml  Net -1060 ml   Filed Weights   10/11/19 1618  Weight: 55.5 kg    Examination:  GENERAL: No acute distress.  Appears well.  HEENT: MMM.  Vision and hearing grossly intact.  NECK: Supple.  No apparent JVD.  RESP: On RA.  No IWOB. Good air movement bilaterally. CVS:  RRR. Heart sounds normal.  ABD/GI/GU: Bowel sounds present. Soft. Non tender.  MSK/EXT:  Moves extremities. No apparent deformity. No edema.  SKIN: no apparent skin lesion or  wound NEURO: Awake and oriented to self and partial place.  Tremulous but no other focal neuro deficit. PSYCH: Calm. Normal affect.  Procedures:  None  Assessment & Plan: Alcohol withdrawal: Drinks about 160 ounce of beers a day.  CIWA elevated to 20.  Mild intermittent tachycardia.  BP stable for most part.  Tremulous and confused on exam. -Add Librium taper -Continue CIWA with as needed Ativan -Multivitamins -Closely monitor electrolytes -Cessation counseling when able to comprehend.  Acute metabolic encephalopathy: Awake but confused and agitated at times.  Likely due to alcohol. -Manage alcohol withdrawal as above -Continue safety sitter  Acute respiratory failure with hypoxia due to COPD exacerbation: Only on as needed albuterol at home.  CTA without acute finding.  Now on room air. -Continue Xopenex, p.o. prednisone and empiric Zithromax  -Protonix for GI prophylaxis  Elevated liver enzymes: Likely due to alcohol. -Continue trending  Tobacco use disorder -Encourage cessation -Nicotine patch  Pancytopenia: Likely due to alcohol.  Leukopenia resolved. -Continue monitoring -Check anemia panel  Chronic back pain/radiculopathy -Continue home gabapentin and Zanaflex  Anxiety/depression: -Continue home gabapentin -On Ativan and Librium for alcohol withdrawal  History of GI bleed: Has history of internal hemorrhoids, LA grade a esophagitis and gastritis.  FOBT negative.  H&H stable. -Continue PPI as above -Bowel regimen  Moderate malnutrition likely  due to alcohol abuse: Body mass index is 20.36 kg/m. Wt Readings from Last 10 Encounters:  10/11/19 55.5 kg  06/02/19 56.7 kg  10/29/17 59.4 kg  08/31/17 55.2 kg  08/07/17 56.2 kg  02/05/16 59 kg  11/17/15 55.3 kg  06/29/14 61.2 kg  06/06/13 61.2 kg  04/22/12 59 kg  -Consult dietitian -Continue multivitamins as above.               DVT prophylaxis: Subcu Lovenox Code Status: Full code Family  Communication: Patient and/or RN. Available if any question.  Discharge barrier: Alcohol withdrawal, encephalopathy (confusion and agitation) requiring safety sitter and close monitoring Patient is from: Home Final disposition: Likely home when medically stable  Consultants: None   Microbiology summarized: COVID-19 negative Influenza PCR negative  Sch Meds:  Scheduled Meds: . azithromycin  500 mg Oral Daily  . chlordiazePOXIDE  25 mg Oral QID   Followed by  . [START ON 10/13/2019] chlordiazePOXIDE  25 mg Oral TID   Followed by  . [START ON 10/14/2019] chlordiazePOXIDE  25 mg Oral BH-qamhs   Followed by  . [START ON 10/15/2019] chlordiazePOXIDE  25 mg Oral Daily  . enoxaparin (LOVENOX) injection  40 mg Subcutaneous Q24H  . folic acid  1 mg Oral Daily  . gabapentin  300 mg Oral TID  . guaiFENesin  600 mg Oral BID  . hydrocortisone  25 mg Rectal Q12H  . levalbuterol  1.25 mg Nebulization BID  . LORazepam  0-4 mg Intravenous Q6H   Followed by  . [START ON 10/13/2019] LORazepam  0-4 mg Intravenous Q12H  . multivitamin with minerals  1 tablet Oral Daily  . nicotine  14 mg Transdermal Daily  . pantoprazole  40 mg Oral Daily  . predniSONE  40 mg Oral Q breakfast  . sodium chloride flush  3 mL Intravenous Q12H  . thiamine  100 mg Oral Daily   Or  . thiamine  100 mg Intravenous Daily   Continuous Infusions: PRN Meds:.acetaminophen **OR** acetaminophen, LORazepam **OR** LORazepam, ondansetron **OR** ondansetron (ZOFRAN) IV, tiZANidine  Antimicrobials: Anti-infectives (From admission, onward)   Start     Dose/Rate Route Frequency Ordered Stop   10/12/19 1145  azithromycin (ZITHROMAX) tablet 500 mg     500 mg Oral Daily 10/11/19 1134 10/16/19 0959   10/11/19 1145  azithromycin (ZITHROMAX) 500 mg in sodium chloride 0.9 % 250 mL IVPB     500 mg 250 mL/hr over 60 Minutes Intravenous Every 24 hours 10/11/19 1134 10/11/19 1545       I have personally reviewed the following labs and  images: CBC: Recent Labs  Lab 10/11/19 0047 10/12/19 0248  WBC 3.8* 4.6  HGB 12.9* 11.9*  HCT 34.9* 32.2*  MCV 98.0 98.8  PLT 126* 115*   BMP &GFR Recent Labs  Lab 10/11/19 0047 10/12/19 0248  NA 135 136  K 3.5 3.5  CL 96* 102  CO2 26 24  GLUCOSE 80 94  BUN <5* 8  CREATININE 0.54* 0.58*  CALCIUM 9.1 9.3  MG  --  2.1  PHOS 4.1 3.5   Estimated Creatinine Clearance: 82.9 mL/min (A) (by C-G formula based on SCr of 0.58 mg/dL (L)). Liver & Pancreas: Recent Labs  Lab 10/11/19 0047  AST 73*  ALT 37  ALKPHOS 70  BILITOT 0.6  PROT 7.6  ALBUMIN 4.1   No results for input(s): LIPASE, AMYLASE in the last 168 hours. No results for input(s): AMMONIA in the last 168 hours. Diabetic: No results  for input(s): HGBA1C in the last 72 hours. No results for input(s): GLUCAP in the last 168 hours. Cardiac Enzymes: No results for input(s): CKTOTAL, CKMB, CKMBINDEX, TROPONINI in the last 168 hours. No results for input(s): PROBNP in the last 8760 hours. Coagulation Profile: No results for input(s): INR, PROTIME in the last 168 hours. Thyroid Function Tests: No results for input(s): TSH, T4TOTAL, FREET4, T3FREE, THYROIDAB in the last 72 hours. Lipid Profile: No results for input(s): CHOL, HDL, LDLCALC, TRIG, CHOLHDL, LDLDIRECT in the last 72 hours. Anemia Panel: No results for input(s): VITAMINB12, FOLATE, FERRITIN, TIBC, IRON, RETICCTPCT in the last 72 hours. Urine analysis:    Component Value Date/Time   COLORURINE YELLOW 06/19/2013 2025   APPEARANCEUR CLEAR 06/19/2013 2025   LABSPEC 1.015 06/19/2013 2025   PHURINE 5.5 06/19/2013 2025   GLUCOSEU NEGATIVE 06/19/2013 2025   HGBUR NEGATIVE 06/19/2013 2025   BILIRUBINUR NEGATIVE 06/19/2013 2025   KETONESUR NEGATIVE 06/19/2013 2025   PROTEINUR NEGATIVE 06/19/2013 2025   UROBILINOGEN 0.2 06/19/2013 2025   NITRITE NEGATIVE 06/19/2013 2025   LEUKOCYTESUR NEGATIVE 06/19/2013 2025   Sepsis Labs: Invalid input(s): PROCALCITONIN,  LACTICIDVEN  Microbiology: Recent Results (from the past 240 hour(s))  Respiratory Panel by RT PCR (Flu A&B, Covid) - Nasopharyngeal Swab     Status: None   Collection Time: 10/11/19 10:06 AM   Specimen: Nasopharyngeal Swab  Result Value Ref Range Status   SARS Coronavirus 2 by RT PCR NEGATIVE NEGATIVE Final    Comment: (NOTE) SARS-CoV-2 target nucleic acids are NOT DETECTED. The SARS-CoV-2 RNA is generally detectable in upper respiratoy specimens during the acute phase of infection. The lowest concentration of SARS-CoV-2 viral copies this assay can detect is 131 copies/mL. A negative result does not preclude SARS-Cov-2 infection and should not be used as the sole basis for treatment or other patient management decisions. A negative result may occur with  improper specimen collection/handling, submission of specimen other than nasopharyngeal swab, presence of viral mutation(s) within the areas targeted by this assay, and inadequate number of viral copies (<131 copies/mL). A negative result must be combined with clinical observations, patient history, and epidemiological information. The expected result is Negative. Fact Sheet for Patients:  https://www.moore.com/ Fact Sheet for Healthcare Providers:  https://www.young.biz/ This test is not yet ap proved or cleared by the [place] FDA and  has been authorized for detection and/or diagnosis of SARS-CoV-2 by FDA under an Emergency Use Authorization (EUA). This EUA will remain  in effect (meaning this test can be used) for the duration of the COVID-19 declaration under Section 564(b)(1) of the Act, 21 U.S.C. section 360bbb-3(b)(1), unless the authorization is terminated or revoked sooner.    Influenza A by PCR NEGATIVE NEGATIVE Final   Influenza B by PCR NEGATIVE NEGATIVE Final    Comment: (NOTE) The Xpert Xpress SARS-CoV-2/FLU/RSV assay is intended as an aid in  the diagnosis of influenza  from Nasopharyngeal swab specimens and  should not be used as a sole basis for treatment. Nasal washings and  aspirates are unacceptable for Xpert Xpress SARS-CoV-2/FLU/RSV  testing. Fact Sheet for Patients: https://www.moore.com/ Fact Sheet for Healthcare Providers: https://www.young.biz/ This test is not yet approved or cleared by the [place] FDA and  has been authorized for detection and/or diagnosis of SARS-CoV-2 by  FDA under an Emergency Use Authorization (EUA). This EUA will remain  in effect (meaning this test can be used) for the duration of the  Covid-19 declaration under Section 564(b)(1) of the Act, 21  U.S.C. section 360bbb-3(b)(1), unless the authorization is  terminated or revoked. Performed at [facility], [address], [place], [place] [address]     Radiology Studies: No results found.     Betzayda Braxton T. Rachna Schonberger [facility]  If 7PM-7AM, please contact night-coverage [email or web address] Password [facility] 10/12/2019, 10:07 AM

## 2019-10-12 NOTE — Progress Notes (Addendum)
Increasing precedex drip and able to given pt scheduled PO phenobarbital for CIWA of 20

## 2019-10-12 NOTE — Progress Notes (Signed)
Pt admitted to 2M05 from 2 [place]

## 2019-10-12 NOTE — Progress Notes (Signed)
CIWA score of 21. 4mg  ativan administered. MD notified. Per order to call a rapid if greater than 20. Per MD continue to monitor, no need to call a rapid.

## 2019-10-12 NOTE — Progress Notes (Signed)
RN arrived to room to find patient combative with 2 NTs. Called for more assistance. CIWA 36. 4 more mg Ativan administered. (42 total since admission) Charge RN notified and at bedside. MD notified, 4 point restraints placed. Rapid response RN called and notified. Transfer to PCCM placed.

## 2019-10-12 NOTE — Progress Notes (Signed)
RN arrived to room to complete assessment, CIWA 26. Pt has significant tremors and extremely agitated. 4mg  IV ativan given. Within the last 24 hours patient had received approximately 30mg  of ativan. MD paged. Librium started.

## 2019-10-13 DIAGNOSIS — F10239 Alcohol dependence with withdrawal, unspecified: Secondary | ICD-10-CM

## 2019-10-13 LAB — MRSA PCR SCREENING: MRSA by PCR: NEGATIVE

## 2019-10-13 MED ORDER — LACTATED RINGERS IV SOLN: INTRAVENOUS | Status: DC

## 2019-10-13 MED ORDER — CLONIDINE HCL 0.1 MG PO TABS
0.1000 mg | ORAL_TABLET | Freq: Two times a day (BID) | ORAL | Status: DC
  Administered 2019-10-13 – 2019-10-16 (×6): 0.1 mg via ORAL
  Filled 2019-10-13 (×7): qty 1

## 2019-10-13 MED ORDER — ENSURE ENLIVE PO LIQD
237.0000 mL | Freq: Three times a day (TID) | ORAL | Status: DC
  Administered 2019-10-13 – 2019-10-15 (×6): 237 mL via ORAL

## 2019-10-13 MED ORDER — LABETALOL HCL 5 MG/ML IV SOLN: 10.0000 mg | INTRAVENOUS | Status: DC | PRN

## 2019-10-13 MED ORDER — ENSURE ENLIVE PO LIQD: 237.0000 mL | Freq: Three times a day (TID) | ORAL | Status: DC

## 2019-10-13 NOTE — Progress Notes (Signed)
eLink Physician-Brief Progress Note Patient Name: MARICUS TANZI DOB: Feb 11, 1966 MRN: [id number]   Date of Service  10/13/2019  HPI/Events of Note  Notified of urinary retention >500 cc. Also request for renewal of sitter  eICU Interventions  In and out cath ordered Order for sitter renewed     Intervention Category Intermediate Interventions: Oliguria - evaluation and management  Darl Pikes 10/13/2019, 1:10 AM

## 2019-10-13 NOTE — Progress Notes (Addendum)
CCMD called pt's HR in 140s. RN assessed pt. He was being placed on bed pan for bowel movement. Denied any SOB or chest. RN will continue to monitor. HR went down post BM.

## 2019-10-13 NOTE — Progress Notes (Signed)
Initial Nutrition Assessment  DOCUMENTATION CODES:   Non-severe (moderate) malnutrition in context of social or environmental circumstances  INTERVENTION:    Ensure Enlive po TID, each supplement provides 350 kcal and 20 grams of protein  Continue MVI daily  NUTRITION DIAGNOSIS:   Moderate Malnutrition related to social / environmental circumstances(alcohol abuse) as evidenced by moderate fat depletion, moderate muscle depletion.  GOAL:   Patient will meet greater than or equal to 90% of their needs  MONITOR:   PO intake, Supplement acceptance, Labs  REASON FOR ASSESSMENT:   Consult Assessment of nutrition requirement/status  ASSESSMENT:   54 yo male admitted with COPD exacerbation. Developed alcohol withdrawal and transferred to the ICU. PMH includes ETOH abuse, smoker, HTN, esophagitis, gastritis.  Patient reports heavy alcohol intake PTA. He usually has beer and cigarettes for breakfast, then one other meal later in the day. He also nibbles on snacks throughout the day. From discussion about usual intake, suspect he is not eating enough protein. He likes vanilla Ensure.   Sitter at bedside reports patient at a good portion of his breakfast today because he was hungry. 20-25% meal completion per RN documentation 2/21.   Labs reviewed.  CBG's: 121  Medications reviewed and include folic acid, MVI, thiamine.  Per patient report, he usually weighs 135 lbs. Weight is down by 10% to 122 lbs this admission.  NUTRITION - FOCUSED PHYSICAL EXAM:    Most Recent Value  Orbital Region  No depletion  Upper Arm Region  Moderate depletion  Thoracic and Lumbar Region  Moderate depletion  Buccal Region  Mild depletion  Temple Region  Mild depletion  Clavicle Bone Region  Severe depletion  Clavicle and Acromion Bone Region  Moderate depletion  Scapular Bone Region  Moderate depletion  Dorsal Hand  Moderate depletion  Patellar Region  Moderate depletion  Anterior Thigh Region   Moderate depletion  Posterior Calf Region  Moderate depletion  Edema (RD Assessment)  None  Hair  Reviewed  Eyes  Reviewed  Mouth  Reviewed  Skin  Reviewed  Nails  Reviewed       Diet Order:   Diet Order            Diet regular Room service appropriate? Yes; Fluid consistency: Thin  Diet effective now              EDUCATION NEEDS:   Not appropriate for education at this time  Skin:  Skin Assessment: Reviewed RN Assessment(MASD to buttocks)  Last BM:  2/19  Height:   Ht Readings from Last 1 Encounters:  10/11/19 5\' 5"  (1.651 m)    Weight:   Wt Readings from Last 1 Encounters:  10/11/19 55.5 kg    Ideal Body Weight:  61.8 kg  BMI:  Body mass index is 20.36 kg/m.  Estimated Nutritional Needs:   Kcal:  1800-2000  Protein:  85-95 gm  Fluid:  >/= 1.8 L    10/13/19, RD, LDN, CNSC Please refer to Amion for contact information.

## 2019-10-13 NOTE — Progress Notes (Signed)
Heart rate going into 140's then back down, MD notified will continue to monitor

## 2019-10-13 NOTE — Progress Notes (Signed)
CCMD called pt's HR in 130s. RN assessed pt. He was eating dinner. Pt denied SOB or chest pain. RN will continue to monitor pt and notify MD of sinus tach pattern will small ADLs.

## 2019-10-13 NOTE — Consult Note (Signed)
PULMONARY / CRITICAL CARE MEDICINE   NAME:  [name], MRN:  [id number], DOB:  Nov 15, 1965, LOS: 1 ADMISSION DATE:  10/11/2019, CONSULTATION DATE:  10/13/2019  REFERRING MD:  Alanda Slim, MD, CHIEF COMPLAINT:  Severe agitation  BRIEF HISTORY:    54 year old smoker, EtOH user, admitted for COPD exacerbation and developed severe alcohol withdrawal within 1 day of admission requiring transfer to ICU HISTORY OF PRESENT ILLNESS   54 year old smoker and drinker, admitted with shortness of breath, found to have a saturation of 87% on room air requiring 2 L.  CT angiogram chest was negative for PE.  He has been treated with azithromycin and steroids.  He reportedly drinks 10 to 12 cans of beer daily and smokes about a pack a day He developed severe agitation on 2/21, overnight has required about 40 mg of Ativan, about 12 mg of Ativan since 7 AM this morning per his bedside RN.  On my arrival he is restrained with 2 people holding him down and he is still trying to get out of bed. Exam at 10 AM "tremulous and confused"  SIGNIFICANT PAST MEDICAL HISTORY   COPD/emphysema Upper GI bleed -EGD showing grade a esophagitis and gastritis  SIGNIFICANT EVENTS:  2/21 >> transferred to ICU for agitated withdrawal  STUDIES:   CT angiogram chest 2/20 >> negative PE, emphysema CULTURES:    ANTIBIOTICS:  azithro 2/18 >>  LINES/TUBES:    CONSULTANTS:   SUBJECTIVE:  Doing better this AM. Ate breakfast. Off precedex.  CONSTITUTIONAL: BP 107/71 (BP Location: Left Arm)   Pulse (!) 116   Temp 98.2 F (36.8 C) (Oral)   Resp 18   Ht 5\' 5"  (1.651 m) Comment: pt stated  Wt 55.5 kg   SpO2 98%   BMI 20.36 kg/m   I/O last 3 completed shifts: In: 811.7 [P.O.:710; I.V.:101.7] Out: 1510 [Urine:1510]        PHYSICAL EXAM: GEN: middle aged man in less distress than yesterday HEENT: MM dry CV: Tachycardic, ext warm PULM: Clear, no accessory muscle use GI: Soft, +BS EXT: No edema NEURO: Moves all  4 ext to command PSYCH: Knows year, name, place but not why he is here SKIN: No rashes   RESOLVED PROBLEM LIST   ASSESSMENT AND PLAN   He seems to be in severely agitated delirium, has required high doses of Ativan including 12 to 16 mg since 7 AM and is now requiring four-point restraints  Acute metabolic encephalopathy Alcohol withdrawal/agitated delirium -PRN ativan -Phenobarb PO taper -Thiamine/folate -Looks much better today, fine to go back to stepdown  Tachycardia - Looks a bit dry, gentle IVF x 1 day then stop  ?AECOPD - Nebs and azithromycin are fine, nicotine patch  SUMMARY OF TODAY'S PLAN:  Transferred to stepdown for continued care, please reach out if we can be of further assistance.  MD

## 2019-10-14 DIAGNOSIS — E876 Hypokalemia: Secondary | ICD-10-CM

## 2019-10-14 DIAGNOSIS — E440 Moderate protein-calorie malnutrition: Secondary | ICD-10-CM | POA: Insufficient documentation

## 2019-10-14 DIAGNOSIS — D638 Anemia in other chronic diseases classified elsewhere: Secondary | ICD-10-CM

## 2019-10-14 LAB — COMPREHENSIVE METABOLIC PANEL
ALT: 25 U/L (ref 0–44)
AST: 37 U/L (ref 15–41)
Albumin: 3.2 g/dL — ABNORMAL LOW (ref 3.5–5.0)
Alkaline Phosphatase: 55 U/L (ref 38–126)
Anion gap: 9 (ref 5–15)
BUN: 13 mg/dL (ref 6–20)
CO2: 25 mmol/L (ref 22–32)
Calcium: 8.8 mg/dL — ABNORMAL LOW (ref 8.9–10.3)
Chloride: 102 mmol/L (ref 98–111)
Creatinine, Ser: 0.61 mg/dL (ref 0.61–1.24)
GFR calc Af Amer: >60 mL/min (ref >60)
GFR calc non Af Amer: >60 mL/min (ref >60)
Glucose, Bld: 89 mg/dL (ref 70–99)
Potassium: 3 mmol/L — ABNORMAL LOW (ref 3.5–5.1)
Sodium: 136 mmol/L (ref 135–145)
Total Bilirubin: 0.6 mg/dL (ref 0.3–1.2)
Total Protein: 5.9 g/dL — ABNORMAL LOW (ref 6.5–8.1)

## 2019-10-14 LAB — CK: Total CK: 270 U/L (ref 49–397)

## 2019-10-14 LAB — CBC
HCT: 32.7 % — ABNORMAL LOW (ref 39.0–52.0)
Hemoglobin: 11.7 g/dL — ABNORMAL LOW (ref 13.0–17.0)
MCH: 35.9 pg — ABNORMAL HIGH (ref 26.0–34.0)
MCHC: 35.8 g/dL (ref 30.0–36.0)
MCV: 100.3 fL — ABNORMAL HIGH (ref 80.0–100.0)
Platelets: 127 10*3/uL — ABNORMAL LOW (ref 150–400)
RBC: 3.26 MIL/uL — ABNORMAL LOW (ref 4.22–5.81)
RDW: 15.4 % (ref 11.5–15.5)
WBC: 5.2 10*3/uL (ref 4.0–10.5)
nRBC: 0 % (ref 0.0–0.2)

## 2019-10-14 LAB — PHOSPHORUS: Phosphorus: 4 mg/dL (ref 2.5–4.6)

## 2019-10-14 LAB — MAGNESIUM: Magnesium: 1.7 mg/dL (ref 1.7–2.4)

## 2019-10-14 MED ORDER — LORAZEPAM 2 MG/ML IJ SOLN: 1.0000 mg | INTRAMUSCULAR | Status: DC | PRN

## 2019-10-14 MED ORDER — MAGNESIUM SULFATE 2 GM/50ML IV SOLN
2.0000 g | Freq: Once | INTRAVENOUS | Status: AC
  Administered 2019-10-14: 2 g via INTRAVENOUS
  Filled 2019-10-14: qty 50

## 2019-10-14 MED ORDER — POTASSIUM CHLORIDE CRYS ER 20 MEQ PO TBCR
40.0000 meq | EXTENDED_RELEASE_TABLET | ORAL | Status: AC
  Administered 2019-10-14 (×3): 40 meq via ORAL
  Filled 2019-10-14 (×3): qty 2

## 2019-10-14 NOTE — Progress Notes (Signed)
Chaplain visited with patient as a result of a consult for an Advanced Directive (AD). The patient accepted the paperwork but due to limited vision, but desires to go over the paperwork with his sister after discharge. The chaplain is available for follow-up if requested.  Lavone Neri Chaplain Resident For questions concerning this note please contact me by pager [phone]

## 2019-10-14 NOTE — Evaluation (Signed)
Physical Therapy Evaluation Patient Details Name: [name] MRN: [id number] DOB: 1965/10/12 Today's Date: 10/14/2019   History of Present Illness  54 y.o. male with medical history significant of asthma/COPD, hypertension, alcohol abuse, tobacco abuse, anxiety, and depression.  He presents with complaints of shortness of breath. He had been trying to use his inhalers without relief of symptoms.  He feels anxious and jittery every morning if he does not drink alcohol. Patient is scheduled to be reevaluated for back surgery on 2/23, due to back pain with radiculopathy down his left leg.  Clinical Impression   Pt admitted with above diagnosis. Patient reports h/o imbalance with near falls, but denies falling. He reports h/o decr sensation in bil feet and has posterior bias/lean in standing. He agreed to education re: use of RW and agreed it helped with keeping him weight shifted forward over his feet. He is adamant that he will return home (and wants to be discharged today).  Pt currently with functional limitations due to the deficits listed below (see PT Problem List). Pt will benefit from skilled PT to increase their independence and safety with mobility to allow discharge to the venue listed below. He consents to additional PT either acute PT or HHPT.      Follow Up Recommendations Home health PT;Supervision for mobility/OOB(pt agreeable)    Equipment Recommendations  Rolling walker with 5" wheels(pt agreeable)    Recommendations for Other Services       Precautions / Restrictions Precautions Precautions: Fall Restrictions Weight Bearing Restrictions: No      Mobility  Bed Mobility               General bed mobility comments: Pt sitting at EOB upon arrival  Transfers Overall transfer level: Needs assistance Equipment used: Rolling walker (2 wheeled);Straight cane Transfers: Sit to/from Stand Sit to Stand: Min guard         General transfer comment: With cane,  pt with significant posterior lean--using backs of his calves against chair to steady himself. With RW, no imbalance  Ambulation/Gait Ambulation/Gait assistance: Min guard;Supervision Gait Distance (Feet): 50 Feet(with cane; 50 RW) Assistive device: Rolling walker (2 wheeled);Straight cane Gait Pattern/deviations: Step-through pattern;Decreased stride length;Wide base of support Gait velocity: decr   General Gait Details: pt naturally chooses wide BOS to improve his balance; initially used pt's cane with mild imbalance noted, however pt able to self-correct (appears much better than when up earlier with OT); with RW, no imbalance noted; educated on how RW assists with forward weight shift to improve his balance; educated on lack of support via RW if begins to lose balance backwards and focused on walking backwards with RW and pt without imbalance  [employer] (Stroke Patients Only)       Balance Overall balance assessment: Needs assistance Sitting-balance support: No upper extremity supported;Feet supported Sitting balance-Leahy Scale: Good     Standing balance support: Single extremity supported;During functional activity Standing balance-Leahy Scale: Poor Standing balance comment: Reliant on UE support         Rhomberg - Eyes Opened: (due to decr sensation, allowed light use of cane; 15 sec)     High Level Balance Comments: all items with cane (due to decr sensation in feet) with feet shoulder width apart, eyes closed x 15 sec with no imbalance; using cane, pt self-selected wide stance to reach to floor to pick up item with  no imbalance; alternating step taps to 7" step without imbalance             Pertinent Vitals/Pain Pain Assessment: No/denies pain    Home Living Family/patient expects to be discharged to:: Private residence Living Arrangements: Other relatives(sister) Available Help at Discharge:  Family;Available 24 hours/day(sister has falls) Type of Home: House Home Access: Stairs to enter   [employer] of Steps: 1 Home Layout: One level Home Equipment: Cane - single point;Tub bench      Prior Function Level of Independence: Independent with assistive device(s)         Comments: Using cane. Performing ADLs and IADLs. doesnt drive     Hand Dominance   Dominant Hand: Left    Extremity/Trunk Assessment   Upper Extremity Assessment Upper Extremity Assessment: Defer to OT evaluation    Lower Extremity Assessment Lower Extremity Assessment: RLE deficits/detail;LLE deficits/detail RLE Sensation: history of peripheral neuropathy(per pt) LLE Sensation: history of peripheral neuropathy(per pt)    Cervical / Trunk Assessment Cervical / Trunk Assessment: Normal  Communication   Communication: No difficulties  Cognition Arousal/Alertness: Awake/alert Behavior During Therapy: Anxious(tearful; wants to go home) Overall Cognitive Status: No family/caregiver present to determine baseline cognitive functioning Area of Impairment: Awareness;Safety/judgement                         Safety/Judgement: Decreased awareness of safety Awareness: Intellectual   General Comments: Stood and remained near seat due to dizziness; states he does this at home when he is dizzy (waits for it to pass or sits back down)      General Comments General comments (skin integrity, edema, etc.): Pt initially very upset as described reason for PT evaluation. Tearful and stating he just wants to go home and we can't keep him against his will. Ultimately calmed down as explained my role is to help him be safe to go home.     Exercises     Assessment/Plan    PT Assessment Patient needs continued PT services  PT Problem List Decreased balance;Decreased mobility;Decreased cognition;Decreased knowledge of use of DME;Decreased safety awareness;Cardiopulmonary status limiting  activity;Impaired sensation       PT Treatment Interventions DME instruction;Gait training;Functional mobility training;Therapeutic activities;Balance training;Neuromuscular re-education;Cognitive remediation;Patient/family education    PT Goals (Current goals can be found in the Care Plan section)  Acute Rehab PT Goals Patient Stated Goal: "I am ready to get out of here" PT Goal Formulation: With patient Time For Goal Achievement: 10/28/19 Potential to Achieve Goals: Good    Frequency Min 3X/week   Barriers to discharge Decreased caregiver support sister has impaired balance with h/o falls    Co-evaluation               AM-PAC PT "6 Clicks" Mobility  Outcome Measure Help needed turning from your back to your side while in a flat bed without using bedrails?: None Help needed moving from lying on your back to sitting on the side of a flat bed without using bedrails?: None Help needed moving to and from a bed to a chair (including a wheelchair)?: A Little Help needed standing up from a chair using your arms (e.g., wheelchair or bedside chair)?: A Little Help needed to walk in hospital room?: A Little Help needed climbing 3-5 steps with a railing? : A Little 6 Click Score: 20    End of Session Equipment Utilized During Treatment: Gait belt Activity Tolerance: Patient tolerated treatment well(HR max 130 bpm;  range 109-130) Patient left: in chair;with call bell/phone within reach;with chair alarm set;with nursing/sitter in room Nurse Communication: Mobility status;Other (comment)(pt upset and wanting to go home today) PT Visit Diagnosis: Unsteadiness on feet (R26.81);Other abnormalities of gait and mobility (R26.89)    Time: 5784-6962 PT Time Calculation (min) (ACUTE ONLY): 27 min   Charges:   PT Evaluation $PT Eval Moderate Complexity: 1 Mod PT Treatments $Gait Training: 8-22 mins         Jerolyn Center, PT Pager [phone]   Zena Amos 10/14/2019, 10:15  AM

## 2019-10-14 NOTE — Progress Notes (Signed)
PROGRESS NOTE  [name] NKN:[id number] DOB: 1965-11-06   PCP: Benito Mccreedy, MD  Patient is from: Home  DOA: 10/11/2019 LOS: 2  Brief Narrative / Interim history: 54 year old male with history of asthma/COPD, alcohol and tobacco abuse, anxiety and depression, and HTN presenting with complaints of shortness of breath and admitted with acute respiratory failure due to COPD exacerbation and alcohol withdrawal.  Reportedly drinks about 200 ounce of beers a day, and smokes about a pack a day.  He was hypoxic to 87% on RA requiring 2 L on admission.  CTA chest negative for PE or any acute finding.  Influenza and COVID-19 PCR negative.  LFT mildly elevated.   The next day, patient was very tremulous, agitated and combative.  PCCM consulted, and he was transferred to ICU and started on Precedex drip.  He came off the Precedex drip the morning of 2/22 and transferred out of ICU.  Subjective: No major events overnight or this morning.  Sitting on bedside chair.  No complaints but likes to go home.  He says he is tired of being in the hospital.  He is oriented x4 except date but very poor insight into his condition and risk of going home to early.  He denies chest pain, dyspnea, nausea, vomiting or abdominal pain.  Objective: Vitals:   10/13/19 2018 10/13/19 2356 10/14/19 0438 10/14/19 0843  BP: 113/84 116/84 116/84 (!) 127/94  Pulse: (!) 120 (!) 102 80 (!) 102  Resp: 15 14 14 16   Temp: 98.7 F (37.1 C) 98.2 F (36.8 C) 97.9 F (36.6 C) 97.9 F (36.6 C)  TempSrc:    Oral  SpO2: 96% 93% 93% 100%  Weight:      Height:        Intake/Output Summary (Last 24 hours) at 10/14/2019 1007 Last data filed at 10/13/2019 2105 Gross per 24 hour  Intake 1200 ml  Output 575 ml  Net 625 ml   Filed Weights   10/11/19 1618  Weight: 55.5 kg    Examination:  GENERAL: No acute distress.  Appears well.  HEENT: MMM.  Vision and hearing grossly intact.  NECK: Supple.  No apparent JVD.    RESP: On RA.  No IWOB. Good air movement bilaterally. CVS:  RRR. Heart sounds normal.  ABD/GI/GU: Bowel sounds present. Soft. Non tender.  MSK/EXT:  Moves extremities. No apparent deformity. No edema.  SKIN: no apparent skin lesion or wound NEURO: Awake, alert and oriented x4 except to date.  Tremulous. No other apparent focal neuro deficit. PSYCH: Calm. Normal affect.  Poor insight.  Procedures:  None  Assessment & Plan: Alcohol withdrawal: Drinks about 200 ounce of beers a day.  Somewhat tremulous.  -Continue phenobarbital taper, as needed Ativan and CIWA monitoring -Multivitamins -Closely monitor electrolytes and replenish aggressively -Cessation counseling  Acute metabolic encephalopathy: Likely due to alcohol. Improved.  Oriented x4 except to date.   -Manage alcohol withdrawal as above -Continue safety sitter  Acute respiratory failure with hypoxia due to COPD exacerbation?  Only on as needed albuterol at home.  CTA without acute finding.  Now on room air. -Completed azithromycin for 3 days. -Received systemic steroid for 2 days. -Continue as needed Xopenex  Hypertension and tachycardia: Likely due to alcohol withdrawal. -Continue clonidine 0.1 mg twice daily with as needed labetalol  Elevated liver enzymes: Likely due to alcohol.  Resolved.  Hypokalemia/hypomagnesemia: Likely due to alcohol. -Replenish and recheck  Tobacco use disorder -Encourage cessation -Nicotine patch  Pancytopenia: Likely due  to alcohol.  Leukopenia resolved.  Anemia panel normal. -Continue monitoring  Chronic back pain/radiculopathy -Continue home gabapentin and Zanaflex  Anxiety/depression: -Continue home gabapentin -On Ativan and Librium for alcohol withdrawal  History of GI bleed: Has history of internal hemorrhoids, LA grade a esophagitis and gastritis.  FOBT negative.  H&H stable. -Continue PPI  -Bowel regimen as needed  Debility/generalized weakness: -PT recommended SNF or HH  if he improves.  Moderate malnutrition likely due to alcohol abuse: Body mass index is 20.36 kg/m. Wt Readings from Last 10 Encounters:  10/11/19 55.5 kg  06/02/19 56.7 kg  10/29/17 59.4 kg  08/31/17 55.2 kg  08/07/17 56.2 kg  02/05/16 59 kg  11/17/15 55.3 kg  06/29/14 61.2 kg  06/06/13 61.2 kg  04/22/12 59 kg  -Consult dietitian -Continue multivitamins as above.     Nutrition Problem: Moderate Malnutrition Etiology: social / environmental circumstances(alcohol abuse)  Signs/Symptoms: moderate fat depletion, moderate muscle depletion  Interventions: Ensure Enlive (each supplement provides 350kcal and 20 grams of protein), MVI   DVT prophylaxis: Subcu Lovenox Code Status: Full code Family Communication: Discussed with patient's sister, [name] over the phone.  Discharge barrier: Alcohol withdrawal.  Still tremulous, and with electrolyte transfers.  Unsafe to discharge home.  Patient is from: Home Final disposition: Likely home when medically stable.  Consultants: PCCM (off)   Microbiology summarized: COVID-19 negative Influenza PCR negative  Sch Meds:  Scheduled Meds: . Chlorhexidine Gluconate Cloth  6 each Topical Daily  . cloNIDine  0.1 mg Oral BID  . enoxaparin (LOVENOX) injection  40 mg Subcutaneous Q24H  . feeding supplement (ENSURE ENLIVE)  237 mL Oral TID BM  . folic acid  1 mg Oral Daily  . gabapentin  300 mg Oral TID  . guaiFENesin  600 mg Oral BID  . multivitamin with minerals  1 tablet Oral Daily  . nicotine  14 mg Transdermal Daily  . pantoprazole  40 mg Oral Daily  . PHENObarbital  200 mg Intravenous Once   Followed by  . phenobarbital  97.2 mg Oral TID   Followed by  . PHENobarbital  64.8 mg Oral TID   Followed by  . [START ON 10/16/2019] PHENobarbital  32.4 mg Oral TID   Followed by  . [START ON 10/18/2019] phenobarbital  16.2 mg Oral TID  . potassium chloride  40 mEq Oral Q4H  . sodium chloride flush  3 mL Intravenous Q12H  . thiamine   100 mg Oral Daily   Or  . thiamine  100 mg Intravenous Daily   Continuous Infusions: . azithromycin 500 mg (10/14/19 1005)  . magnesium sulfate bolus IVPB     PRN Meds:.acetaminophen **OR** acetaminophen, ipratropium, labetalol, levalbuterol, [DISCONTINUED] LORazepam **OR** LORazepam, ondansetron **OR** ondansetron (ZOFRAN) IV, tiZANidine  Antimicrobials: Anti-infectives (From admission, onward)   Start     Dose/Rate Route Frequency Ordered Stop   10/13/19 0800  azithromycin (ZITHROMAX) 500 mg in sodium chloride 0.9 % 250 mL IVPB     500 mg 250 mL/hr over 60 Minutes Intravenous Every 24 hours 10/12/19 1435 10/16/19 0759   10/12/19 1145  azithromycin (ZITHROMAX) tablet 500 mg  Status:  Discontinued     500 mg Oral Daily 10/11/19 1134 10/12/19 1435   10/11/19 1145  azithromycin (ZITHROMAX) 500 mg in sodium chloride 0.9 % 250 mL IVPB     500 mg 250 mL/hr over 60 Minutes Intravenous Every 24 hours 10/11/19 1134 10/11/19 1545       I have personally reviewed the  following labs and images: CBC: Recent Labs  Lab 10/11/19 0047 10/12/19 0248 10/14/19 0301  WBC 3.8* 4.6 5.2  HGB 12.9* 11.9* 11.7*  HCT 34.9* 32.2* 32.7*  MCV 98.0 98.8 100.3*  PLT 126* 115* 127*   BMP &GFR Recent Labs  Lab 10/11/19 0047 10/12/19 0248 10/14/19 0301  NA 135 136 136  K 3.5 3.5 3.0*  CL 96* 102 102  CO2 26 24 25   GLUCOSE 80 94 89  BUN <5* 8 13  CREATININE 0.54* 0.58* 0.61  CALCIUM 9.1 9.3 8.8*  MG  --  2.1 1.7  PHOS 4.1 3.5 4.0   Estimated Creatinine Clearance: 82.9 mL/min (by C-G formula based on SCr of 0.61 mg/dL). Liver & Pancreas: Recent Labs  Lab 10/11/19 0047 10/14/19 0301  AST 73* 37  ALT 37 25  ALKPHOS 70 55  BILITOT 0.6 0.6  PROT 7.6 5.9*  ALBUMIN 4.1 3.2*   No results for input(s): LIPASE, AMYLASE in the last 168 hours. No results for input(s): AMMONIA in the last 168 hours. Diabetic: No results for input(s): HGBA1C in the last 72 hours. Recent Labs  Lab  10/12/19 1520  GLUCAP 121*   Cardiac Enzymes: Recent Labs  Lab 10/14/19 0301  CKTOTAL 270   No results for input(s): PROBNP in the last 8760 hours. Coagulation Profile: No results for input(s): INR, PROTIME in the last 168 hours. Thyroid Function Tests: No results for input(s): TSH, T4TOTAL, FREET4, T3FREE, THYROIDAB in the last 72 hours. Lipid Profile: No results for input(s): CHOL, HDL, LDLCALC, TRIG, CHOLHDL, LDLDIRECT in the last 72 hours. Anemia Panel: Recent Labs    10/12/19 1115  VITAMINB12 720  FOLATE 65.4  FERRITIN 73  TIBC 427  IRON 97  RETICCTPCT 1.7   Urine analysis:    Component Value Date/Time   COLORURINE YELLOW 06/19/2013 2025   APPEARANCEUR CLEAR 06/19/2013 2025   LABSPEC 1.015 06/19/2013 2025   PHURINE 5.5 06/19/2013 2025   GLUCOSEU NEGATIVE 06/19/2013 2025   HGBUR NEGATIVE 06/19/2013 2025   BILIRUBINUR NEGATIVE 06/19/2013 2025   KETONESUR NEGATIVE 06/19/2013 2025   PROTEINUR NEGATIVE 06/19/2013 2025   UROBILINOGEN 0.2 06/19/2013 2025   NITRITE NEGATIVE 06/19/2013 2025   LEUKOCYTESUR NEGATIVE 06/19/2013 2025   Sepsis Labs: Invalid input(s): PROCALCITONIN, LACTICIDVEN  Microbiology: Recent Results (from the past 240 hour(s))  Respiratory Panel by RT PCR (Flu A&B, Covid) - Nasopharyngeal Swab     Status: None   Collection Time: 10/11/19 10:06 AM   Specimen: Nasopharyngeal Swab  Result Value Ref Range Status   SARS Coronavirus 2 by RT PCR NEGATIVE NEGATIVE Final    Comment: (NOTE) SARS-CoV-2 target nucleic acids are NOT DETECTED. The SARS-CoV-2 RNA is generally detectable in upper respiratoy specimens during the acute phase of infection. The lowest concentration of SARS-CoV-2 viral copies this assay can detect is 131 copies/mL. A negative result does not preclude SARS-Cov-2 infection and should not be used as the sole basis for treatment or other patient management decisions. A negative result may occur with  improper specimen  collection/handling, submission of specimen other than nasopharyngeal swab, presence of viral mutation(s) within the areas targeted by this assay, and inadequate number of viral copies (<131 copies/mL). A negative result must be combined with clinical observations, patient history, and epidemiological information. The expected result is Negative. Fact Sheet for Patients:  10/13/19 Fact Sheet for Healthcare Providers:  https://www.moore.com/ This test is not yet ap proved or cleared by the https://www.young.biz/ and  has been authorized  for detection and/or diagnosis of SARS-CoV-2 by FDA under an Emergency Use Authorization (EUA). This EUA will remain  in effect (meaning this test can be used) for the duration of the COVID-19 declaration under Section 564(b)(1) of the Act, 21 U.S.C. section 360bbb-3(b)(1), unless the authorization is terminated or revoked sooner.    Influenza A by PCR NEGATIVE NEGATIVE Final   Influenza B by PCR NEGATIVE NEGATIVE Final    Comment: (NOTE) The Xpert Xpress SARS-CoV-2/FLU/RSV assay is intended as an aid in  the diagnosis of influenza from Nasopharyngeal swab specimens and  should not be used as a sole basis for treatment. Nasal washings and  aspirates are unacceptable for Xpert Xpress SARS-CoV-2/FLU/RSV  testing. Fact Sheet for Patients: https://www.moore.com/ Fact Sheet for Healthcare Providers: https://www.young.biz/ This test is not yet approved or cleared by the [place] FDA and  has been authorized for detection and/or diagnosis of SARS-CoV-2 by  FDA under an Emergency Use Authorization (EUA). This EUA will remain  in effect (meaning this test can be used) for the duration of the  Covid-19 declaration under Section 564(b)(1) of the Act, 21  U.S.C. section 360bbb-3(b)(1), unless the authorization is  terminated or revoked. Performed at [facility]  [facility], [address], [place], [place] [address]   MRSA PCR Screening     Status: Abnormal   Collection Time: 10/12/19  1:05 PM   Specimen: Nasal Mucosa; Nasopharyngeal  Result Value Ref Range Status   MRSA by PCR (A) NEGATIVE Final    INVALID, UNABLE TO DETERMINE THE PRESENCE OF TARGET DUE TO SPECIMEN INTEGRITY. RECOLLECTION REQUESTED.    Comment: REPEATED TO VERIFY RN C HAYES @2206  10/12/19 BY S GEZAHEGN Performed at [facility], [address], [place], [place]   MRSA PCR Screening     Status: None   Collection Time: 10/13/19 12:30 AM  Result Value Ref Range Status   MRSA by PCR NEGATIVE NEGATIVE Final    Comment:        The GeneXpert MRSA Assay (FDA approved for NASAL specimens only), is one component of a comprehensive MRSA colonization surveillance program. It is not intended to diagnose MRSA infection nor to guide or monitor treatment for MRSA infections. Performed at [facility], [address], [place], [place]     Radiology Studies: No results found.     Mendy Lapinsky T. Avion Kutzer [facility]  If 7PM-7AM, please contact night-coverage [email or web address] Password [facility] 10/14/2019, 10:07 AM

## 2019-10-14 NOTE — Evaluation (Signed)
Occupational Therapy Evaluation Patient Details Name: [name] MRN: [id number] DOB: 02-26-1966 Today's Date: 10/14/2019    History of Present Illness 54 y.o. male with medical history significant of asthma/COPD, hypertension, alcohol abuse, tobacco abuse, anxiety, and depression.  He presents with complaints of shortness of breath. He had been trying to use his inhalers without relief of symptoms.  He feels anxious and jittery every morning if he does not drink alcohol. Patient is scheduled to be reevaluated for back surgery on 2/23, due to back pain with radiculopathy down his left leg.   Clinical Impression   PTA, pt was living with his sister and was independent with ADLs and IADLs and used a walking stick for mobility; pt reports his other sister will drive them to the grocery store. Pt currently requiring Min A for LB ADLs and functional mobility with his walking stick. Pt presenting with poor balance and cognition impacting his safe performance of ADLs. Pt requiring Min A for fall prevention. SpO2 >90% on RA and HR elevating to 157 with minimal activity. Pt reporting he wants to go home today and stating he won't go to a rehab. Pt would benefit from further acute OT to facilitate safe dc. Recommend dc to SNF for further OT to optimize safety, independence with ADLs, and return to PLOF.      Follow Up Recommendations  SNF;Home health OT;Supervision/Assistance - 24 hour(Pt declining SNF stating "I am ready to go home now")    Equipment Recommendations  None recommended by OT    Recommendations for Other Services PT consult     Precautions / Restrictions Precautions Precautions: Fall      Mobility Bed Mobility               General bed mobility comments: Pt sitting at EOB upon arrival  Transfers Overall transfer level: Needs assistance   Transfers: Sit to/from Stand Sit to Stand: Min assist         General transfer comment: Pt with significant posterior lean  and requiring Min A for correction and fall prevention    Balance Overall balance assessment: Needs assistance Sitting-balance support: No upper extremity supported;Feet supported Sitting balance-Leahy Scale: Good     Standing balance support: Single extremity supported;During functional activity;No upper extremity supported Standing balance-Leahy Scale: Poor Standing balance comment: Reliant on UE support                           ADL either performed or assessed with clinical judgement   ADL Overall ADL's : Needs assistance/impaired Eating/Feeding: Set up;Sitting   Grooming: Oral care;Min guard;Standing Grooming Details (indicate cue type and reason): Pt performing oral care with Min guard A. Noting tremors at BUE. Pt HR elevating to 157 while at sink. Upper Body Bathing: Minimal assistance;Sitting   Lower Body Bathing: Minimal assistance;Sit to/from stand   Upper Body Dressing : Minimal assistance;Sitting Upper Body Dressing Details (indicate cue type and reason): Donning second gown like a jacket. Poor problem solving to don like a jacket and requiring Min A for sequencing Lower Body Dressing: Minimal assistance;Sit to/from stand Lower Body Dressing Details (indicate cue type and reason): Pt donning socks by bending forward; requiring significant time and effort. HR elevating to 120-130s with LB dressing. Min A for standing balance.             Functional mobility during ADLs: Minimal assistance;Cane General ADL Comments: Pt presenting with poor balance nad cognition impacting his  safety during ADLs     Vision Baseline Vision/History: Wears glasses(Noting disconjugate gaze) Wears Glasses: Reading only Patient Visual Report: No change from baseline Vision Assessment?: Vision impaired- to be further tested in functional context     Perception     Praxis      Pertinent Vitals/Pain Pain Assessment: No/denies pain     Hand Dominance Left    Extremity/Trunk Assessment Upper Extremity Assessment Upper Extremity Assessment: Overall WFL for tasks assessed(tremors)   Lower Extremity Assessment Lower Extremity Assessment: Defer to PT evaluation   Cervical / Trunk Assessment Cervical / Trunk Assessment: Normal   Communication Communication Communication: No difficulties   Cognition Arousal/Alertness: Awake/alert Behavior During Therapy: WFL for tasks assessed/performed Overall Cognitive Status: Impaired/Different from baseline Area of Impairment: Awareness;Safety/judgement                         Safety/Judgement: Decreased awareness of safety Awareness: Intellectual   General Comments: Pleasantly agreeable to therapy. Very poor awareness of deficits. Stating he stubbles all the time at home but never falls because he catches himself. During oral care, pt leaving the faucet running and requiring Mod cues to recognize problem.   General Comments  HR elevating to 157 with activity; resting HR 110s. SpO2 >90 on RA    Exercises     Shoulder Instructions      Home Living Family/patient expects to be discharged to:: Private residence Living Arrangements: Other relatives Available Help at Discharge: Family;Available 24 hours/day Type of Home: House Home Access: Stairs to enter [employer] of Steps: 1   Home Layout: One level     Bathroom Shower/Tub: Chief Strategy Officer: Standard     Home Equipment: Cane - single point;Tub bench          Prior Functioning/Environment Level of Independence: Independent with assistive device(s)        Comments: Using cane. Performing ADLs and IADLs. doesnt drive        OT Problem List: Decreased strength;Decreased range of motion;Decreased activity tolerance;Impaired balance (sitting and/or standing);Decreased knowledge of use of DME or AE;Decreased knowledge of precautions;Impaired UE functional use;Decreased safety awareness;Decreased  cognition      OT Treatment/Interventions: Self-care/ADL training;Therapeutic exercise;Energy conservation;DME and/or AE instruction;Therapeutic activities;Patient/family education    OT Goals(Current goals can be found in the care plan section) Acute Rehab OT Goals Patient Stated Goal: "I am ready to get out of here" OT Goal Formulation: With patient Time For Goal Achievement: 10/28/19 Potential to Achieve Goals: Good  OT Frequency: Min 2X/week   Barriers to D/C: Decreased caregiver support  Pt reporting his sister has fallen at home multiple times       Co-evaluation              AM-PAC OT "6 Clicks" Daily Activity     Outcome Measure Help from another person eating meals?: None Help from another person taking care of personal grooming?: A Little Help from another person toileting, which includes using toliet, bedpan, or urinal?: A Little Help from another person bathing (including washing, rinsing, drying)?: A Little Help from another person to put on and taking off regular upper body clothing?: A Little Help from another person to put on and taking off regular lower body clothing?: A Little 6 Click Score: 19   End of Session Equipment Utilized During Treatment: Gait belt Nurse Communication: Mobility status  Activity Tolerance: Patient tolerated treatment well Patient left: in chair;with call bell/phone within  reach;with chair alarm set  OT Visit Diagnosis: Unsteadiness on feet (R26.81);Other abnormalities of gait and mobility (R26.89);Muscle weakness (generalized) (M62.81)                Time: 0301-3143 OT Time Calculation (min): 27 min Charges:  OT General Charges $OT Visit: 1 Visit OT Evaluation $OT Eval Moderate Complexity: 1 Mod OT Treatments $Self Care/Home Management : 8-22 mins  Tylea Hise MSOT, OTR/L Acute Rehab Pager: [phone] Office: [place] 10/14/2019, 8:49 AM

## 2019-10-14 NOTE — Plan of Care (Signed)
  Problem: Clinical Measurements: Goal: Ability to maintain clinical measurements within normal limits will improve Outcome: Progressing   Problem: Clinical Measurements: Goal: Cardiovascular complication will be avoided Outcome: Progressing   

## 2019-10-14 NOTE — TOC Transition Note (Addendum)
Transition of Care [facility]) - CM/SW Discharge Note   Patient Details  Name: [name] MRN: [id number] Date of Birth: 1966-04-04  [facility]) CM/SW Contact:  Lawerance Sabal, RN Phone Number: 10/14/2019, 2:54 PM   Clinical Narrative:    Spoke to patient at  Bedside.  He states that he lives at home w his sister, who will be able to provide transportation home. He has a walking stick and a cane, requesting RW. Order placed and requested [employer] to bring to room. He is very appreciative of HH being set up. He is agreeable to any [facility] company. Referall to [facility] pending acceptance. He states that address on file is correct and best contact number for him is [phone]  [facility] can accept for [facility] PT     Final next level of care: Home w Home Health Services Barriers to Discharge: Continued Medical Work up   Patient Goals and CMS Choice Patient states their goals for this hospitalization and ongoing recovery are:: to go home CMS Medicare.gov Compare Post Acute Care list provided to:: Patient Choice offered to / list presented to : Patient  Discharge Placement                       Discharge Plan and Services                DME Arranged: Walker rolling DME Agency: [facility] Date DME Agency Contacted: 10/14/19 Time DME Agency Contacted: 1454 Representative spoke with at DME Agency: Ian Malkin HH Arranged: PT, OT HH Agency: [facility] Date HH Agency Contacted: 10/14/19 Time HH Agency Contacted: 1454 Representative spoke with at [facility] Agency: Lupita Leash  Social Determinants of Health (SDOH) Interventions     Readmission Risk Interventions No flowsheet data found.

## 2019-10-15 LAB — MAGNESIUM: Magnesium: 2.1 mg/dL (ref 1.7–2.4)

## 2019-10-15 LAB — RENAL FUNCTION PANEL
Albumin: 3.3 g/dL — ABNORMAL LOW (ref 3.5–5.0)
Anion gap: 9 (ref 5–15)
BUN: 9 mg/dL (ref 6–20)
CO2: 24 mmol/L (ref 22–32)
Calcium: 8.6 mg/dL — ABNORMAL LOW (ref 8.9–10.3)
Chloride: 104 mmol/L (ref 98–111)
Creatinine, Ser: 0.58 mg/dL — ABNORMAL LOW (ref 0.61–1.24)
GFR calc Af Amer: >60 mL/min (ref >60)
GFR calc non Af Amer: >60 mL/min (ref >60)
Glucose, Bld: 88 mg/dL (ref 70–99)
Phosphorus: 4.1 mg/dL (ref 2.5–4.6)
Potassium: 3.8 mmol/L (ref 3.5–5.1)
Sodium: 137 mmol/L (ref 135–145)

## 2019-10-15 LAB — CBC
HCT: 32 % — ABNORMAL LOW (ref 39.0–52.0)
Hemoglobin: 11.3 g/dL — ABNORMAL LOW (ref 13.0–17.0)
MCH: 36.1 pg — ABNORMAL HIGH (ref 26.0–34.0)
MCHC: 35.3 g/dL (ref 30.0–36.0)
MCV: 102.2 fL — ABNORMAL HIGH (ref 80.0–100.0)
Platelets: 130 10*3/uL — ABNORMAL LOW (ref 150–400)
RBC: 3.13 MIL/uL — ABNORMAL LOW (ref 4.22–5.81)
RDW: 15 % (ref 11.5–15.5)
WBC: 5.2 10*3/uL (ref 4.0–10.5)
nRBC: 0 % (ref 0.0–0.2)

## 2019-10-15 NOTE — TOC Progression Note (Signed)
Transition of Care [facility]) - Progression Note    Patient Details  Name: [name] MRN: [id number] Date of Birth: 15-Jan-1966  Transition of Care [facility]) CM/SW Contact  Lawerance Sabal, RN Phone Number: 10/15/2019, 1:17 PM  Clinical Narrative:    Ordered 3/1 to be delivered to the room.      Barriers to Discharge: Continued Medical Work up  Expected Discharge Plan and Services                           DME Arranged: 3-N-1 DME Agency: [facility] Date DME Agency Contacted: 10/15/19 Time DME Agency Contacted: [phone] Representative spoke with at DME Agency: Ian Malkin HH Arranged: PT, OT HH Agency: [facility] Date HH Agency Contacted: 10/14/19 Time HH Agency Contacted: 1454 Representative spoke with at [facility] Agency: Lupita Leash   Social Determinants of Health (SDOH) Interventions    Readmission Risk Interventions No flowsheet data found.

## 2019-10-15 NOTE — Progress Notes (Signed)
PROGRESS NOTE  [name] VQQ:[id number] DOB: 05-26-1966   PCP: Benito Mccreedy, MD  Patient is from: Home  DOA: 10/11/2019 LOS: 3  Brief Narrative / Interim history: 54 year old male with history of asthma/COPD, alcohol and tobacco abuse, anxiety and depression, and HTN presenting with complaints of shortness of breath and admitted with acute respiratory failure due to COPD exacerbation and alcohol withdrawal.  Reportedly drinks about 200 ounce of beers a day, and smokes about a pack a day.  He was hypoxic to 87% on RA requiring 2 L on admission.  CTA chest negative for PE or any acute finding.  Influenza and COVID-19 PCR negative.  LFT mildly elevated.   The next day, patient was very tremulous, agitated and combative.  PCCM consulted, and he was transferred to ICU and started on Precedex drip.  He came off the Precedex drip the morning of 2/22 and transferred out of ICU.  Subjective: No major events overnight or this morning.  CIWA scores remains low although he appears to be somewhat tremulous.  No complaint this morning but feels weak.  Denies chest pain, dyspnea, GI or UTI symptoms.  He says he is ready to quit drinking and quit smoking.  Interested in resources for help.   Objective: Vitals:   10/15/19 0416 10/15/19 0729 10/15/19 0745 10/15/19 1112  BP: 110/85  (!) 129/94   Pulse: 80  89   Resp: 16 18    Temp: 98.3 F (36.8 C)  (!) 97.2 F (36.2 C)   TempSrc: Oral     SpO2: 96% 100% 94% 94%  Weight:      Height:        Intake/Output Summary (Last 24 hours) at 10/15/2019 1139 Last data filed at 10/15/2019 1114 Gross per 24 hour  Intake 1303 ml  Output 2600 ml  Net -1297 ml   Filed Weights   10/11/19 1618  Weight: 55.5 kg    Examination:  GENERAL: No acute distress.  Appears well.  HEENT: MMM.  Vision and hearing grossly intact.  NECK: Supple.  No apparent JVD.  RESP:  No IWOB. Good air movement bilaterally. CVS:  RRR. Heart sounds normal.  ABD/GI/GU:  Bowel sounds present. Soft. Non tender.  MSK/EXT:  Moves extremities. No apparent deformity. No edema.  SKIN: no apparent skin lesion or wound NEURO: Awake, alert and oriented x4.  Somewhat tremulous but no other focal neuro deficits. PSYCH: Calm. Normal affect.  Procedures:  None  Assessment & Plan: Alcohol withdrawal: Drinks about 200 ounce of beers a day.  Somewhat tremulous but better.  He says he is determined to quit drinking. -Continue phenobarbital taper for detoxification -Continue CIWA with as needed Ativan -Continue vitamins -Closely monitor electrolytes and replenish aggressively -Cessation counseling provided.  Determined to quit.  Interested in resources.  CSW consulted.  Acute metabolic encephalopathy: Likely due to alcohol.  Resolved. -Manage alcohol withdrawal as above  Acute respiratory failure with hypoxia due to COPD exacerbation: Resolved.  Only on as needed albuterol at home.  CTA without acute finding.  Now on room air. -Completed azithromycin for 3 days. -Received systemic steroid for 2 days. -Continue as needed Xopenex  Hypertension and tachycardia: Likely due to alcohol withdrawal.  Resolved. -Continue clonidine 0.1 mg twice daily with as needed labetalol  Elevated liver enzymes: Likely due to alcohol.  Resolved.  Hypokalemia/hypomagnesemia: Likely due to alcohol.  Resolved.  Tobacco use disorder -Encouraged cessation -Nicotine patch.  Interested in Rx for nicotine patch on discharge.  Pancytopenia:  Likely due to alcohol.  Leukopenia resolved.  Anemia panel normal. -Continue monitoring  Chronic back pain/radiculopathy -Continue home gabapentin and Zanaflex  Anxiety/depression: Stable -Continue home gabapentin  History of GI bleed: Has history of internal hemorrhoids, LA grade a esophagitis and gastritis.  FOBT negative.  H&H stable. -Continue PPI  -Bowel regimen as needed  Debility/generalized weakness: -PT/OT-recommended HH PT and  DME  Moderate malnutrition likely due to alcohol abuse: Body mass index is 20.36 kg/m. Wt Readings from Last 10 Encounters:  10/11/19 55.5 kg  06/02/19 56.7 kg  10/29/17 59.4 kg  08/31/17 55.2 kg  08/07/17 56.2 kg  02/05/16 59 kg  11/17/15 55.3 kg  06/29/14 61.2 kg  06/06/13 61.2 kg  04/22/12 59 kg  -Appreciate dietitian input -Continue multivitamins as above.     Nutrition Problem: Moderate Malnutrition Etiology: social / environmental circumstances(alcohol abuse)  Signs/Symptoms: moderate fat depletion, moderate muscle depletion  Interventions: Ensure Enlive (each supplement provides 350kcal and 20 grams of protein), MVI   DVT prophylaxis: Subcu Lovenox Code Status: Full code Family Communication: Discussed with patient's sister, [name] over the phone.  Discharge barrier: Still tremulous.  He is on phenobarbital taper for detoxification. Patient may not able to manage this medication at home safely. Patient is from: Home Final disposition: Likely home on 2/25.  Consultants: PCCM (off)   Microbiology summarized: COVID-19 negative Influenza PCR negative  Sch Meds:  Scheduled Meds: . Chlorhexidine Gluconate Cloth  6 each Topical Daily  . cloNIDine  0.1 mg Oral BID  . enoxaparin (LOVENOX) injection  40 mg Subcutaneous Q24H  . feeding supplement (ENSURE ENLIVE)  237 mL Oral TID BM  . folic acid  1 mg Oral Daily  . gabapentin  300 mg Oral TID  . guaiFENesin  600 mg Oral BID  . multivitamin with minerals  1 tablet Oral Daily  . nicotine  14 mg Transdermal Daily  . pantoprazole  40 mg Oral Daily  . PHENObarbital  200 mg Intravenous Once   Followed by  . PHENobarbital  64.8 mg Oral TID   Followed by  . [START ON 10/16/2019] PHENobarbital  32.4 mg Oral TID   Followed by  . [START ON 10/18/2019] phenobarbital  16.2 mg Oral TID  . sodium chloride flush  3 mL Intravenous Q12H  . thiamine  100 mg Oral Daily   Or  . thiamine  100 mg Intravenous Daily    Continuous Infusions:  PRN Meds:.acetaminophen **OR** acetaminophen, ipratropium, labetalol, levalbuterol, LORazepam, ondansetron **OR** ondansetron (ZOFRAN) IV, tiZANidine  Antimicrobials: Anti-infectives (From admission, onward)   Start     Dose/Rate Route Frequency Ordered Stop   10/13/19 0800  azithromycin (ZITHROMAX) 500 mg in sodium chloride 0.9 % 250 mL IVPB  Status:  Discontinued     500 mg 250 mL/hr over 60 Minutes Intravenous Every 24 hours 10/12/19 1435 10/14/19 1014   10/12/19 1145  azithromycin (ZITHROMAX) tablet 500 mg  Status:  Discontinued     500 mg Oral Daily 10/11/19 1134 10/12/19 1435   10/11/19 1145  azithromycin (ZITHROMAX) 500 mg in sodium chloride 0.9 % 250 mL IVPB     500 mg 250 mL/hr over 60 Minutes Intravenous Every 24 hours 10/11/19 1134 10/11/19 1545       I have personally reviewed the following labs and images: CBC: Recent Labs  Lab 10/11/19 0047 10/12/19 0248 10/14/19 0301 10/15/19 0247  WBC 3.8* 4.6 5.2 5.2  HGB 12.9* 11.9* 11.7* 11.3*  HCT 34.9* 32.2* 32.7* 32.0*  MCV 98.0 98.8 100.3* 102.2*  PLT 126* 115* 127* 130*   BMP &GFR Recent Labs  Lab 10/11/19 0047 10/12/19 0248 10/14/19 0301 10/15/19 0247  NA 135 136 136 137  K 3.5 3.5 3.0* 3.8  CL 96* 102 102 104  CO2 26 24 25 24   GLUCOSE 80 94 89 88  BUN <5* 8 13 9   CREATININE 0.54* 0.58* 0.61 0.58*  CALCIUM 9.1 9.3 8.8* 8.6*  MG  --  2.1 1.7 2.1  PHOS 4.1 3.5 4.0 4.1   Estimated Creatinine Clearance: 82.9 mL/min (A) (by C-G formula based on SCr of 0.58 mg/dL (L)). Liver & Pancreas: Recent Labs  Lab 10/11/19 0047 10/14/19 0301 10/15/19 0247  AST 73* 37  --   ALT 37 25  --   ALKPHOS 70 55  --   BILITOT 0.6 0.6  --   PROT 7.6 5.9*  --   ALBUMIN 4.1 3.2* 3.3*   No results for input(s): LIPASE, AMYLASE in the last 168 hours. No results for input(s): AMMONIA in the last 168 hours. Diabetic: No results for input(s): HGBA1C in the last 72 hours. Recent Labs  Lab  10/12/19 1520  GLUCAP 121*   Cardiac Enzymes: Recent Labs  Lab 10/14/19 0301  CKTOTAL 270   No results for input(s): PROBNP in the last 8760 hours. Coagulation Profile: No results for input(s): INR, PROTIME in the last 168 hours. Thyroid Function Tests: No results for input(s): TSH, T4TOTAL, FREET4, T3FREE, THYROIDAB in the last 72 hours. Lipid Profile: No results for input(s): CHOL, HDL, LDLCALC, TRIG, CHOLHDL, LDLDIRECT in the last 72 hours. Anemia Panel: No results for input(s): VITAMINB12, FOLATE, FERRITIN, TIBC, IRON, RETICCTPCT in the last 72 hours. Urine analysis:    Component Value Date/Time   COLORURINE YELLOW 06/19/2013 2025   APPEARANCEUR CLEAR 06/19/2013 2025   LABSPEC 1.015 06/19/2013 2025   PHURINE 5.5 06/19/2013 2025   GLUCOSEU NEGATIVE 06/19/2013 2025   HGBUR NEGATIVE 06/19/2013 2025   BILIRUBINUR NEGATIVE 06/19/2013 2025   KETONESUR NEGATIVE 06/19/2013 2025   PROTEINUR NEGATIVE 06/19/2013 2025   UROBILINOGEN 0.2 06/19/2013 2025   NITRITE NEGATIVE 06/19/2013 2025   LEUKOCYTESUR NEGATIVE 06/19/2013 2025   Sepsis Labs: Invalid input(s): PROCALCITONIN, LACTICIDVEN  Microbiology: Recent Results (from the past 240 hour(s))  Respiratory Panel by RT PCR (Flu A&B, Covid) - Nasopharyngeal Swab     Status: None   Collection Time: 10/11/19 10:06 AM   Specimen: Nasopharyngeal Swab  Result Value Ref Range Status   SARS Coronavirus 2 by RT PCR NEGATIVE NEGATIVE Final    Comment: (NOTE) SARS-CoV-2 target nucleic acids are NOT DETECTED. The SARS-CoV-2 RNA is generally detectable in upper respiratoy specimens during the acute phase of infection. The lowest concentration of SARS-CoV-2 viral copies this assay can detect is 131 copies/mL. A negative result does not preclude SARS-Cov-2 infection and should not be used as the sole basis for treatment or other patient management decisions. A negative result may occur with  improper specimen collection/handling,  submission of specimen other than nasopharyngeal swab, presence of viral mutation(s) within the areas targeted by this assay, and inadequate number of viral copies (<131 copies/mL). A negative result must be combined with clinical observations, patient history, and epidemiological information. The expected result is Negative. Fact Sheet for Patients:  06/21/2013 Fact Sheet for Healthcare Providers:  10/13/19 This test is not yet ap proved or cleared by the https://www.moore.com/ FDA and  has been authorized for detection and/or diagnosis of SARS-CoV-2 by FDA under  an Emergency Use Authorization (EUA). This EUA will remain  in effect (meaning this test can be used) for the duration of the COVID-19 declaration under Section 564(b)(1) of the Act, 21 U.S.C. section 360bbb-3(b)(1), unless the authorization is terminated or revoked sooner.    Influenza A by PCR NEGATIVE NEGATIVE Final   Influenza B by PCR NEGATIVE NEGATIVE Final    Comment: (NOTE) The Xpert Xpress SARS-CoV-2/FLU/RSV assay is intended as an aid in  the diagnosis of influenza from Nasopharyngeal swab specimens and  should not be used as a sole basis for treatment. Nasal washings and  aspirates are unacceptable for Xpert Xpress SARS-CoV-2/FLU/RSV  testing. Fact Sheet for Patients: https://www.moore.com/ Fact Sheet for Healthcare Providers: https://www.young.biz/ This test is not yet approved or cleared by the [place] FDA and  has been authorized for detection and/or diagnosis of SARS-CoV-2 by  FDA under an Emergency Use Authorization (EUA). This EUA will remain  in effect (meaning this test can be used) for the duration of the  Covid-19 declaration under Section 564(b)(1) of the Act, 21  U.S.C. section 360bbb-3(b)(1), unless the authorization is  terminated or revoked. Performed at [facility], [address]  [place], [place] [address]   MRSA PCR Screening     Status: Abnormal   Collection Time: 10/12/19  1:05 PM   Specimen: Nasal Mucosa; Nasopharyngeal  Result Value Ref Range Status   MRSA by PCR (A) NEGATIVE Final    INVALID, UNABLE TO DETERMINE THE PRESENCE OF TARGET DUE TO SPECIMEN INTEGRITY. RECOLLECTION REQUESTED.    Comment: REPEATED TO VERIFY RN C HAYES @2206  10/12/19 BY S GEZAHEGN Performed at [facility], [address] [place], [place]   MRSA PCR Screening     Status: None   Collection Time: 10/13/19 12:30 AM  Result Value Ref Range Status   MRSA by PCR NEGATIVE NEGATIVE Final    Comment:        The GeneXpert MRSA Assay (FDA approved for NASAL specimens only), is one component of a comprehensive MRSA colonization surveillance program. It is not intended to diagnose MRSA infection nor to guide or monitor treatment for MRSA infections. Performed at [facility], [address] [place], [place]     Radiology Studies: No results found.     Jai Bear T. Tziporah Knoke [facility]  If 7PM-7AM, please contact night-coverage [email or web address] Password [facility] 10/15/2019, 11:39 AM

## 2019-10-15 NOTE — Consult Note (Signed)
   [facility] Inpatient Consult   10/15/2019  TRACE WIRICK [birth year] [id number]   Patient was assessed for [facility] [[facility] Care Management for community services. Patient was previously active with [facility] Care Management.  Member with [facility] noted in Accountable Care Orgaization [ACO}.  Brief Narrative note from MD progress notes today reveals, includes but not limited to the following:  54 year old male with history of asthma/COPD, alcohol and tobacco abuse, anxiety and depression, and HTN presenting with complaints of shortness of breath and admitted with acute respiratory failure due to COPD.  PCP:  Dr. Greggory Stallion Osei-Bonsu, [place]  Plan:  Continue to follow for transitional needs.  Of note, [facility] Care Management services does not replace or interfere with any services that are arranged by inpatient [facility] care management team.   For additional questions or referrals please contact:  Charlesetta Shanks, RN BSN CCM [facility]  [phone] business mobile phone Toll free office [phone]  Fax number: [phone] [email or web address]

## 2019-10-15 NOTE — Plan of Care (Signed)
No acute events during this shift. Pt did not require CIWA protocol to become effective this shift. VSS and WDL for this pt. Call bell within reach of pt, encouraged to use call bell for assitance.      Problem: Education: Goal: Knowledge of General Education information will improve Description: Including pain rating scale, medication(s)/side effects and non-pharmacologic comfort measures Outcome: Progressing   Problem: Health Behavior/Discharge Planning: Goal: Ability to manage health-related needs will improve Outcome: Progressing   Problem: Clinical Measurements: Goal: Ability to maintain clinical measurements within normal limits will improve Outcome: Progressing Goal: Will remain free from infection Outcome: Progressing Goal: Diagnostic test results will improve Outcome: Progressing Goal: Respiratory complications will improve Outcome: Progressing Goal: Cardiovascular complication will be avoided Outcome: Progressing   Problem: Activity: Goal: Risk for activity intolerance will decrease Outcome: Progressing   Problem: Nutrition: Goal: Adequate nutrition will be maintained Outcome: Progressing   Problem: Coping: Goal: Level of anxiety will decrease Outcome: Progressing   Problem: Elimination: Goal: Will not experience complications related to bowel motility Outcome: Progressing Goal: Will not experience complications related to urinary retention Outcome: Progressing   Problem: Pain Managment: Goal: General experience of comfort will improve Outcome: Progressing   Problem: Safety: Goal: Ability to remain free from injury will improve Outcome: Progressing   Problem: Skin Integrity: Goal: Risk for impaired skin integrity will decrease Outcome: Progressing

## 2019-10-15 NOTE — TOC Progression Note (Signed)
Transition of Care [facility]) - Progression Note    Patient Details  Name: [name] MRN: [id number] Date of Birth: 1966-08-07  Transition of Care [facility]) CM/SW Contact  Lawerance Sabal, RN Phone Number: 10/15/2019, 10:48 AM  Clinical Narrative:    Able to secure [facility] PT, verbal from Dr Alanda Slim to remove [facility] OT from [facility] order. HH can refer to OP PT OT when [facility] no longer needed.  Patient provided with SA resources.      Barriers to Discharge: Continued Medical Work up  Expected Discharge Plan and Services                           DME Arranged: Dan Humphreys rolling DME Agency: [employer] Date DME Agency Contacted: 10/14/19 Time DME Agency Contacted: [phone] Representative spoke with at DME Agency: Ian Malkin HH Arranged: PT, OT HH Agency: [facility] Date HH Agency Contacted: 10/14/19 Time HH Agency Contacted: 1454 Representative spoke with at [facility] Agency: Lupita Leash   Social Determinants of Health (SDOH) Interventions    Readmission Risk Interventions No flowsheet data found.

## 2019-10-15 NOTE — Plan of Care (Signed)
  Problem: Nutrition: Goal: Adequate nutrition will be maintained Outcome: Progressing   Problem: Elimination: Goal: Will not experience complications related to bowel motility Outcome: Progressing Goal: Will not experience complications related to urinary retention Outcome: Progressing   Problem: Pain Managment: Goal: General experience of comfort will improve Outcome: Progressing  Condom catheter in place. Ensure TID. Nutrition improving.

## 2019-10-15 NOTE — Care Management Important Message (Signed)
Important Message  Patient Details  Name: [name] MRN: [id number] Date of Birth: 1966/01/13   Medicare Important Message Given:  Yes  Im given    Dorena Bodo 10/15/2019, 1:43 PM

## 2019-10-16 LAB — CBC
HCT: 31.5 % — ABNORMAL LOW (ref 39.0–52.0)
Hemoglobin: 11.3 g/dL — ABNORMAL LOW (ref 13.0–17.0)
MCH: 36.7 pg — ABNORMAL HIGH (ref 26.0–34.0)
MCHC: 35.9 g/dL (ref 30.0–36.0)
MCV: 102.3 fL — ABNORMAL HIGH (ref 80.0–100.0)
Platelets: 158 10*3/uL (ref 150–400)
RBC: 3.08 MIL/uL — ABNORMAL LOW (ref 4.22–5.81)
RDW: 14.6 % (ref 11.5–15.5)
WBC: 5.7 10*3/uL (ref 4.0–10.5)
nRBC: 0 % (ref 0.0–0.2)

## 2019-10-16 LAB — MAGNESIUM: Magnesium: 1.7 mg/dL (ref 1.7–2.4)

## 2019-10-16 LAB — RENAL FUNCTION PANEL
Albumin: 3.4 g/dL — ABNORMAL LOW (ref 3.5–5.0)
Anion gap: 12 (ref 5–15)
BUN: 9 mg/dL (ref 6–20)
CO2: 22 mmol/L (ref 22–32)
Calcium: 8.9 mg/dL (ref 8.9–10.3)
Chloride: 102 mmol/L (ref 98–111)
Creatinine, Ser: 0.5 mg/dL — ABNORMAL LOW (ref 0.61–1.24)
GFR calc Af Amer: >60 mL/min (ref >60)
GFR calc non Af Amer: >60 mL/min (ref >60)
Glucose, Bld: 86 mg/dL (ref 70–99)
Phosphorus: 4.9 mg/dL — ABNORMAL HIGH (ref 2.5–4.6)
Potassium: 3.3 mmol/L — ABNORMAL LOW (ref 3.5–5.1)
Sodium: 136 mmol/L (ref 135–145)

## 2019-10-16 MED ORDER — MAGNESIUM SULFATE 2 GM/50ML IV SOLN
2.0000 g | Freq: Once | INTRAVENOUS | Status: AC
  Administered 2019-10-16: 2 g via INTRAVENOUS
  Filled 2019-10-16: qty 50

## 2019-10-16 MED ORDER — PHENOBARBITAL 32.4 MG PO TABS: ORAL_TABLET | ORAL | 0 refills | Status: DC

## 2019-10-16 MED ORDER — ENSURE ENLIVE PO LIQD: 237.0000 mL | Freq: Three times a day (TID) | ORAL | 0 refills | Status: AC

## 2019-10-16 MED ORDER — POTASSIUM CHLORIDE CRYS ER 20 MEQ PO TBCR
40.0000 meq | EXTENDED_RELEASE_TABLET | ORAL | Status: AC
  Administered 2019-10-16 (×2): 40 meq via ORAL
  Filled 2019-10-16 (×2): qty 2

## 2019-10-16 MED ORDER — PHENOBARBITAL 16.2 MG PO TABS: ORAL_TABLET | ORAL | 0 refills | Status: DC

## 2019-10-16 MED ORDER — THIAMINE HCL 100 MG PO TABS: 100.0000 mg | ORAL_TABLET | Freq: Every day | ORAL | 1 refills | Status: DC

## 2019-10-16 MED ORDER — MULTI-VITAMIN/MINERALS PO TABS: 1.0000 | ORAL_TABLET | Freq: Every day | ORAL | 1 refills | Status: AC

## 2019-10-16 MED FILL — VITAMIN B-1 100 MG TABS: 100 | 30 days supply | Qty: 30 | Fill #0

## 2019-10-16 MED FILL — CERTAVITE/ANTIOXIDANTS TABS: 30 days supply | Qty: 30 | Fill #0

## 2019-10-16 MED FILL — PHENOBARBITAL 32.4 MG TABS: 32.4 | 4 days supply | Qty: 9 | Fill #0

## 2019-10-16 NOTE — Progress Notes (Signed)
Occupational Therapy Treatment Patient Details Name: [name] MRN: [id number] DOB: 19-Apr-1966 Today's Date: 10/16/2019    History of present illness 54 y.o. male with medical history significant of asthma/COPD, hypertension, alcohol abuse, tobacco abuse, anxiety, and depression.  He presents with complaints of shortness of breath. He had been trying to use his inhalers without relief of symptoms.  He feels anxious and jittery every morning if he does not drink alcohol. Patient is scheduled to be reevaluated for back surgery on 2/23, due to back pain with radiculopathy down his left leg.   OT comments  Pt toileted, groomed and dressed to discharge home with his sister with set up to supervision. Unsteady gait and static standing, educated in fall prevention and importance of using his RW. Pt verbalized understanding.  Follow Up Recommendations  Home health OT    Equipment Recommendations  3 in 1 bedside commode    Recommendations for Other Services      Precautions / Restrictions Precautions Precautions: Fall       Mobility Bed Mobility Overal bed mobility: Modified Independent             General bed mobility comments: HOB up  Transfers Overall transfer level: Needs assistance Equipment used: Rolling walker (2 wheeled) Transfers: Sit to/from Stand Sit to Stand: Supervision         General transfer comment: cues for hand placement    Balance Overall balance assessment: Needs assistance   Sitting balance-Leahy Scale: Normal       Standing balance-Leahy Scale: Poor Standing balance comment: Reliant on UE support                           ADL either performed or assessed with clinical judgement   ADL Overall ADL's : Needs assistance/impaired     Grooming: Wash/dry hands;Standing;Supervision/safety           Upper Body Dressing : Set up;Sitting   Lower Body Dressing: Supervision/safety;Sit to/from stand Lower Body Dressing Details  (indicate cue type and reason): LOB backward while pulling up pants, cues to sit to start pant over feet and to don shoes Toilet Transfer: Supervision/safety;Ambulation;RW   Toileting- Clothing Manipulation and Hygiene: Supervision/safety;Sit to/from stand       Functional mobility during ADLs: Supervision/safety;Rolling walker       Vision   Additional Comments: decreased acuity   Perception     Praxis      Cognition Arousal/Alertness: Awake/alert Behavior During Therapy: WFL for tasks assessed/performed Overall Cognitive Status: Within Functional Limits for tasks assessed                                          Exercises     Shoulder Instructions       General Comments      Pertinent Vitals/ Pain       Pain Assessment: No/denies pain  Home Living                                          Prior Functioning/Environment              Frequency  Min 2X/week        Progress Toward Goals  OT Goals(current goals can now be found in the  care plan section)  Progress towards OT goals: Progressing toward goals  Acute Rehab OT Goals Patient Stated Goal: "I am ready to get out of here" OT Goal Formulation: With patient Time For Goal Achievement: 10/28/19 Potential to Achieve Goals: Good  Plan Discharge plan needs to be updated    Co-evaluation                 AM-PAC OT "6 Clicks" Daily Activity     Outcome Measure   Help from another person eating meals?: None Help from another person taking care of personal grooming?: A Little Help from another person toileting, which includes using toliet, bedpan, or urinal?: A Little Help from another person bathing (including washing, rinsing, drying)?: A Little Help from another person to put on and taking off regular upper body clothing?: None Help from another person to put on and taking off regular lower body clothing?: A Little 6 Click Score: 20    End of Session  Equipment Utilized During Treatment: Gait belt;Rolling walker  OT Visit Diagnosis: Unsteadiness on feet (R26.81);Other abnormalities of gait and mobility (R26.89);Muscle weakness (generalized) (M62.81)   Activity Tolerance Patient tolerated treatment well   Patient Left in chair;with call bell/phone within reach(with PT)   Nurse Communication          Time: 1779-3903 OT Time Calculation (min): 23 min  Charges: OT General Charges $OT Visit: 1 Visit OT Treatments $Self Care/Home Management : 23-37 mins  Martie Round, OTR/L Acute Rehabilitation Services Pager: [phone] Office: [phone]  Evern Bio 10/16/2019, 10:40 AM

## 2019-10-16 NOTE — Discharge Summary (Signed)
Physician Discharge Summary  [name] OYD:[id number] DOB: [birth year] DOA: 10/11/2019  PCP: Benito Mccreedy, MD  Admit date: 10/11/2019 Discharge date: 10/16/2019  Admitted From: Home Disposition: Home  Recommendations for Outpatient Follow-up:  1. Follow ups as below. 2. Please obtain CBC/CMP/Mag at follow up 3. Please follow up on the following pending results: None  Home Health: PT Equipment/Devices: Rolling walker and 3 in 1 commode  Discharge Condition: Stable CODE STATUS: Full code  Follow-up Information    Osei-Bonsu, Iona Beard, MD. Schedule an appointment as soon as possible for a visit in 1 week(s).   Specialty: Internal Medicine Contact information: [address] [place] [address] [phone]        [facility] Follow up.   Specialty: Home Health Services Why: For home health services. They will call in a few days to set up your first appointment. Please be sure to answer your phone          Hospital Course: 54 year old male with history of asthma/COPD, alcohol and tobacco abuse, anxiety and depression, and HTN presenting with complaints of shortness of breath and admitted with acute respiratory failure due to COPD exacerbation and alcohol withdrawal.  Reportedly drinks about 200 ounce of beers a day, and smokes about a pack a day.  He was hypoxic to 87% on RA requiring 2 L on admission.  CTA chest negative for PE or any acute finding.  Influenza and COVID-19 PCR negative.  LFT mildly elevated.   The next day, patient was very tremulous, agitated and combative.  PCCM consulted, and he was transferred to ICU and started on Precedex drip.  He came off the Precedex drip the morning of 2/22, started on phenobarbital taper with as needed Ativan, and transferred out of ICU.  Patient continued on phenobarbital taper.  Withdrawal symptoms, weakness and CIWA scores improved tremendously.  He was discharged on phenobarbital  taper for 4 more days.  He was counseled on alcohol and tobacco cessation, and provided with resources and prescription for nicotine patch.  Evaluated by PT who recommended home health PT, rolling walker and 3 in 1 commode.   Discharge Diagnoses:  Alcohol withdrawal: Drinks about 200 ounce of beers a day.  He says he is determined to quit drinking.  Provided with resources for alcohol cessation.  Discharged on phenobarbital taper as below for 4 more days. Multivitamins and thiamine.   Acute metabolic encephalopathy: Likely due to alcohol.  Resolved.  Acute respiratory failure with hypoxia due to COPD exacerbation: Transient and resolved.  Only on as needed albuterol at home.  CTA without acute finding.  Now on room air. -Completed azithromycin for 3 days. -Received systemic steroid for 2 days. -Continue as needed albuterol  Hypertension and tachycardia: Likely due to alcohol withdrawal.  Resolved.  Elevated liver enzymes: Likely due to alcohol.  Resolved.  Hypokalemia/hypomagnesemia: Likely due to alcohol.   Hypokalemia replenished prior to discharge.  Tobacco use disorder: encouraged cessation.  Gave Rx for nicotine patc and quit line number.  Pancytopenia: Likely due to alcohol.    Leukopenia and thrombocytopenia resolved.  Anemia stable.  Chronic back pain/radiculopathy -Continue home gabapentin and Zanaflex  Anxiety/depression: Stable -Continue home gabapentin  History of GI bleed: Has history of internal hemorrhoids, LA grade a esophagitis and gastritis.  FOBT negative.  H&H stable.  Debility/generalized weakness: -PT/OT-discharge plans PT, rolling walker and 3 in 1 commode.    Discharge Instructions  Discharge Instructions    Call MD  for:  difficulty breathing, headache or visual disturbances   Complete by: As directed    Call MD for:  extreme fatigue   Complete by: As directed    Call MD for:  persistant dizziness or light-headedness   Complete by: As  directed    Call MD for:  persistant nausea and vomiting   Complete by: As directed    Call MD for:  temperature >100.4   Complete by: As directed    Diet - low sodium heart healthy   Complete by: As directed    Discharge instructions   Complete by: As directed    It has been a pleasure taking care of you! You were hospitalized and treated for alcohol withdrawal.  We strongly encourage you to quit drinking alcohol and smoking cigarettes.  Please utilize the resources you have been provided for help with this.  Please review your new medication list and the directions before you take your medications.  Follow-up with your primary care doctor in 1 to 2 weeks.  Take care,   Increase activity slowly   Complete by: As directed      Allergies as of 10/16/2019   No Known Allergies     Medication List    TAKE these medications   albuterol 108 (90 Base) MCG/ACT inhaler Commonly known as: VENTOLIN HFA Inhale 2 puffs into the lungs every 4 (four) hours as needed for wheezing or shortness of breath.   BC FAST PAIN RELIEF PO Take 1 packet by mouth every 6 (six) hours as needed (for headaches).   feeding supplement (ENSURE ENLIVE) Liqd Take 237 mLs by mouth 3 (three) times daily between meals.   gabapentin 300 MG capsule Commonly known as: NEURONTIN Take 300 mg by mouth 3 (three) times daily.   hydrocortisone 25 MG suppository Commonly known as: ANUSOL-HC Place 1 suppository (25 mg total) rectally every 12 (twelve) hours.   meloxicam 7.5 MG tablet Commonly known as: MOBIC Take 7.5 mg by mouth daily.   multivitamin with minerals tablet Take 1 tablet by mouth daily.   pantoprazole 40 MG tablet Commonly known as: Protonix Take 1 tablet (40 mg total) by mouth daily.   phenobarbital 16.2 MG tablet Commonly known as: LUMINAL Take 2 tablets (32.4 mg total) by mouth 3 (three) times daily for 2 days, THEN 1 tablet (16.2 mg total) 3 (three) times daily for 2 days. Start taking on:  October 16, 2019   thiamine 100 MG tablet Take 1 tablet (100 mg total) by mouth daily.   tiZANidine 2 MG tablet Commonly known as: ZANAFLEX Take 1-2 tablets by mouth every 8 (eight) hours as needed for muscle spasms.            Durable Medical Equipment  (From admission, onward)         Start     Ordered   10/15/19 1316  For home use only DME 3 n 1  Once     10/15/19 1315   10/14/19 1451  For home use only DME Walker rolling  Once    Question Answer Comment  Walker: With 5 Inch Wheels   Patient needs a walker to treat with the following condition Weakness      10/14/19 1450          Consultations:  PCCM  Procedures/Studies:   CT Angio Chest PE W and/or Wo Contrast  Result Date: 10/11/2019 CLINICAL DATA:  Shortness of breath for the last couple of days EXAM: CT ANGIOGRAPHY CHEST  WITH CONTRAST TECHNIQUE: Multidetector CT imaging of the chest was performed using the standard protocol during bolus administration of intravenous contrast. Multiplanar CT image reconstructions and MIPs were obtained to evaluate the vascular anatomy. CONTRAST:  58mL OMNIPAQUE IOHEXOL 350 MG/ML SOLN COMPARISON:  08/08/2017 chest CT FINDINGS: Cardiovascular: Normal heart size. No pericardial effusion. No pulmonary artery filling defect. Negative aorta with minor atheromatous changes Mediastinum/Nodes: Negative for adenopathy or pneumomediastinum. Lungs/Pleura: Emphysema. Mild dependent atelectasis. Small nodular density at the right apex measuring 4 mm, stable. Stable focus of scarring in the right middle lobe. Upper Abdomen: Negative Musculoskeletal: No acute or aggressive finding Review of the MIP images confirms the above findings. IMPRESSION: 1. Negative for pulmonary embolism or other acute finding. 2. Emphysema and mild dependent atelectasis. Electronically Signed   By: Marnee Spring M.D.   On: 10/11/2019 10:23       Discharge Exam: Vitals:   10/15/19 1205 10/15/19 2211  BP: 106/85  (!) 118/104  Pulse: 91 85  Resp:    Temp: 98 F (36.7 C) 98.4 F (36.9 C)  SpO2: 98% 100%    GENERAL: No acute distress.  Ambulating in the hallway with walker HEENT: MMM.  Vision and hearing grossly intact.  NECK: Supple.  No apparent JVD.  RESP:  No IWOB. Good air movement bilaterally. CVS:  RRR. Heart sounds normal.  ABD/GI/GU: Bowel sounds present. Soft. Non tender.  MSK/EXT:  Moves extremities. No apparent deformity or edema.  SKIN: no apparent skin lesion or wound NEURO: Awake, alert and oriented appropriately.  No apparent focal neuro deficit. PSYCH: Calm. Normal affect.    The results of significant diagnostics from this hospitalization (including imaging, microbiology, ancillary and laboratory) are listed below for reference.     Microbiology: Recent Results (from the past 240 hour(s))  Respiratory Panel by RT PCR (Flu A&B, Covid) - Nasopharyngeal Swab     Status: None   Collection Time: 10/11/19 10:06 AM   Specimen: Nasopharyngeal Swab  Result Value Ref Range Status   SARS Coronavirus 2 by RT PCR NEGATIVE NEGATIVE Final    Comment: (NOTE) SARS-CoV-2 target nucleic acids are NOT DETECTED. The SARS-CoV-2 RNA is generally detectable in upper respiratoy specimens during the acute phase of infection. The lowest concentration of SARS-CoV-2 viral copies this assay can detect is 131 copies/mL. A negative result does not preclude SARS-Cov-2 infection and should not be used as the sole basis for treatment or other patient management decisions. A negative result may occur with  improper specimen collection/handling, submission of specimen other than nasopharyngeal swab, presence of viral mutation(s) within the areas targeted by this assay, and inadequate number of viral copies (<131 copies/mL). A negative result must be combined with clinical observations, patient history, and epidemiological information. The expected result is Negative. Fact Sheet for Patients:    https://www.moore.com/ Fact Sheet for Healthcare Providers:  https://www.young.biz/ This test is not yet ap proved or cleared by the [place] FDA and  has been authorized for detection and/or diagnosis of SARS-CoV-2 by FDA under an Emergency Use Authorization (EUA). This EUA will remain  in effect (meaning this test can be used) for the duration of the COVID-19 declaration under Section 564(b)(1) of the Act, 21 U.S.C. section 360bbb-3(b)(1), unless the authorization is terminated or revoked sooner.    Influenza A by PCR NEGATIVE NEGATIVE Final   Influenza B by PCR NEGATIVE NEGATIVE Final    Comment: (NOTE) The Xpert Xpress SARS-CoV-2/FLU/RSV assay is intended as an aid in  the diagnosis  of influenza from Nasopharyngeal swab specimens and  should not be used as a sole basis for treatment. Nasal washings and  aspirates are unacceptable for Xpert Xpress SARS-CoV-2/FLU/RSV  testing. Fact Sheet for Patients: https://www.moore.com/ Fact Sheet for Healthcare Providers: https://www.young.biz/ This test is not yet approved or cleared by the [place] FDA and  has been authorized for detection and/or diagnosis of SARS-CoV-2 by  FDA under an Emergency Use Authorization (EUA). This EUA will remain  in effect (meaning this test can be used) for the duration of the  Covid-19 declaration under Section 564(b)(1) of the Act, 21  U.S.C. section 360bbb-3(b)(1), unless the authorization is  terminated or revoked. Performed at [facility], [address] [place], [place] [address]   MRSA PCR Screening     Status: Abnormal   Collection Time: 10/12/19  1:05 PM   Specimen: Nasal Mucosa; Nasopharyngeal  Result Value Ref Range Status   MRSA by PCR (A) NEGATIVE Final    INVALID, UNABLE TO DETERMINE THE PRESENCE OF TARGET DUE TO SPECIMEN INTEGRITY. RECOLLECTION REQUESTED.    Comment: REPEATED TO VERIFY RN C  HAYES @2206  10/12/19 BY S GEZAHEGN Performed at [facility], [address], [place], [place]   MRSA PCR Screening     Status: None   Collection Time: 10/13/19 12:30 AM  Result Value Ref Range Status   MRSA by PCR NEGATIVE NEGATIVE Final    Comment:        The GeneXpert MRSA Assay (FDA approved for NASAL specimens only), is one component of a comprehensive MRSA colonization surveillance program. It is not intended to diagnose MRSA infection nor to guide or monitor treatment for MRSA infections. Performed at [facility], [address] [place], [place]      Labs: BNP (last 3 results) No results for input(s): BNP in the last 8760 hours. Basic Metabolic Panel: Recent Labs  Lab 10/11/19 0047 10/12/19 0248 10/14/19 0301 10/15/19 0247 10/16/19 0237  NA 135 136 136 137 136  K 3.5 3.5 3.0* 3.8 3.3*  CL 96* 102 102 104 102  CO2 26 24 25 24 22   GLUCOSE 80 94 89 88 86  BUN <5* 8 13 9 9   CREATININE 0.54* 0.58* 0.61 0.58* 0.50*  CALCIUM 9.1 9.3 8.8* 8.6* 8.9  MG  --  2.1 1.7 2.1 1.7  PHOS 4.1 3.5 4.0 4.1 4.9*   Liver Function Tests: Recent Labs  Lab 10/11/19 0047 10/14/19 0301 10/15/19 0247 10/16/19 0237  AST 73* 37  --   --   ALT 37 25  --   --   ALKPHOS 70 55  --   --   BILITOT 0.6 0.6  --   --   PROT 7.6 5.9*  --   --   ALBUMIN 4.1 3.2* 3.3* 3.4*   No results for input(s): LIPASE, AMYLASE in the last 168 hours. No results for input(s): AMMONIA in the last 168 hours. CBC: Recent Labs  Lab 10/11/19 0047 10/12/19 0248 10/14/19 0301 10/15/19 0247 10/16/19 0237  WBC 3.8* 4.6 5.2 5.2 5.7  HGB 12.9* 11.9* 11.7* 11.3* 11.3*  HCT 34.9* 32.2* 32.7* 32.0* 31.5*  MCV 98.0 98.8 100.3* 102.2* 102.3*  PLT 126* 115* 127* 130* 158   Cardiac Enzymes: Recent Labs  Lab 10/14/19 0301  CKTOTAL 270   BNP: Invalid input(s): POCBNP CBG: Recent Labs  Lab 10/12/19 1520  GLUCAP 121*   D-Dimer No results for input(s): DDIMER in the  last 72 hours. Hgb A1c No results for input(s): HGBA1C in the last 72 hours. Lipid Profile No results for input(s): CHOL, HDL, LDLCALC, TRIG, CHOLHDL, LDLDIRECT in the last 72 hours. Thyroid function studies No results for input(s): TSH, T4TOTAL, T3FREE, THYROIDAB in the last 72 hours.  Invalid input(s): FREET3 Anemia work up No results for input(s): VITAMINB12, FOLATE, FERRITIN, TIBC, IRON, RETICCTPCT in the last 72 hours. Urinalysis    Component Value Date/Time   COLORURINE YELLOW 06/19/2013 2025   APPEARANCEUR CLEAR 06/19/2013 2025   LABSPEC 1.015 06/19/2013 2025   PHURINE 5.5 06/19/2013 2025   GLUCOSEU NEGATIVE 06/19/2013 2025   HGBUR NEGATIVE 06/19/2013 2025   BILIRUBINUR NEGATIVE 06/19/2013 2025   KETONESUR NEGATIVE 06/19/2013 2025   PROTEINUR NEGATIVE 06/19/2013 2025   UROBILINOGEN 0.2 06/19/2013 2025   NITRITE NEGATIVE 06/19/2013 2025   LEUKOCYTESUR NEGATIVE 06/19/2013 2025   Sepsis Labs Invalid input(s): PROCALCITONIN,  WBC,  LACTICIDVEN   Time coordinating discharge: 35 minutes  SIGNED:  Almon Hercules, MD  [facility] 10/16/2019, 6:56 AM  If 7PM-7AM, please contact night-coverage [email or web address] Password [email or web address]

## 2019-10-16 NOTE — Care Management (Signed)
Pt deemed appropriate for discharge home today.  CM reviewed chart for outstanding TOC needs/orders/cosults - none found.   Pt confirms that his sister will provide recommended 24 hour supervision.   CM signing off

## 2019-10-16 NOTE — Plan of Care (Signed)

## 2019-10-16 NOTE — Final Progress Note (Addendum)
PIV removed. AVS discussed with patient, all questions and concerns answered. BSC commode at bedside for home use. Discharge home with home health. Information given on smoking and alcohol cessation. Educated patient on if he is going to smoke to remove nicotine patch prior to smoking. Verbalized understanding. Pt wheeled out to friends car with all belongings including clothes and cane. New medication prescriptions given to patient prior to discharge.

## 2019-10-16 NOTE — Progress Notes (Signed)
Physical Therapy Treatment Patient Details Name: [name] MRN: [id number] DOB: 1965/11/17 Today's Date: 10/16/2019    History of Present Illness 54 y.o. male with medical history significant of asthma/COPD, hypertension, alcohol abuse, tobacco abuse, anxiety, and depression.  He presents with complaints of shortness of breath. He had been trying to use his inhalers without relief of symptoms.  He feels anxious and jittery every morning if he does not drink alcohol. Patient is scheduled to be reevaluated for back surgery on 2/23, due to back pain with radiculopathy down his left leg.   PT Comments    Pt progressing well with mobility, preparing for d/c home today. Agreeable to gait training with RW; stability much improved, intermittent cues for technique and safety; educ on stair training. Pt pleasant and agreeable. RW already delivered to room and adjusted for pt's height.    Follow Up Recommendations  Home health PT;Supervision for mobility/OOB     Equipment Recommendations  Rolling walker with 5" wheels;3in1 (PT)(already delivered to room)    Recommendations for Other Services       Precautions / Restrictions Precautions Precautions: Fall Restrictions Weight Bearing Restrictions: No    Mobility  Bed Mobility Overal bed mobility: Modified Independent             General bed mobility comments: Received sitting in recliner  Transfers Overall transfer level: Needs assistance Equipment used: Rolling walker (2 wheeled) Transfers: Sit to/from Stand Sit to Stand: Supervision         General transfer comment: cues for hand placement  Ambulation/Gait Ambulation/Gait assistance: Min guard;Supervision Gait Distance (Feet): 350 Feet Assistive device: Rolling walker (2 wheeled) Gait Pattern/deviations: Step-through pattern;Decreased stride length;Wide base of support   Gait velocity interpretation: 1.31 - 2.62 ft/sec, indicative of limited community  ambulator General Gait Details: Pt opting to use RW (instead of walking stick) which is what he plans to use at home; slow, mostly steady gait with RW and intermittent min guard for balance, progressing to supervision   Stairs Stairs: (Pt declined; reports 1 threshold step into home, educ on technique with RW)           Wheelchair Mobility    Modified Rankin (Stroke Patients Only)       Balance Overall balance assessment: Needs assistance Sitting-balance support: No upper extremity supported;Feet supported Sitting balance-Leahy Scale: Normal     Standing balance support: Single extremity supported;During functional activity Standing balance-Leahy Scale: Fair Standing balance comment: Can static stand without UE support; reliant on UE support for dynamic stability                            Cognition Arousal/Alertness: Awake/alert Behavior During Therapy: WFL for tasks assessed/performed Overall Cognitive Status: Within Functional Limits for tasks assessed                                 General Comments: WFL for simple tasks; not formally assessed      Exercises      General Comments General comments (skin integrity, edema, etc.): Very agreeable and motivated to participate      Pertinent Vitals/Pain Pain Assessment: No/denies pain    Home Living                      Prior Function            PT Goals (current  goals can now be found in the care plan section) Acute Rehab PT Goals Patient Stated Goal: "I am ready to get out of here" Progress towards PT goals: Progressing toward goals    Frequency    Min 3X/week      PT Plan Current plan remains appropriate    Co-evaluation              AM-PAC PT "6 Clicks" Mobility   Outcome Measure  Help needed turning from your back to your side while in a flat bed without using bedrails?: None Help needed moving from lying on your back to sitting on the side of a flat  bed without using bedrails?: None Help needed moving to and from a bed to a chair (including a wheelchair)?: A Little Help needed standing up from a chair using your arms (e.g., wheelchair or bedside chair)?: A Little Help needed to walk in hospital room?: A Little Help needed climbing 3-5 steps with a railing? : A Little 6 Click Score: 20    End of Session   Activity Tolerance: Patient tolerated treatment well Patient left: in chair;with call bell/phone within reach(with MD present) Nurse Communication: Mobility status PT Visit Diagnosis: Unsteadiness on feet (R26.81);Other abnormalities of gait and mobility (R26.89)     Time: 0932-3557 PT Time Calculation (min) (ACUTE ONLY): 12 min  Charges:  $Gait Training: 8-22 mins                     Mabeline Caras, PT, DPT Acute Rehabilitation Services  Pager [phone] Office [place] 10/16/2019, 11:26 AM

## 2019-10-17 DIAGNOSIS — F10131 Alcohol abuse with withdrawal delirium: Secondary | ICD-10-CM | POA: Diagnosis not present

## 2019-10-17 DIAGNOSIS — K2100 Gastro-esophageal reflux disease with esophagitis, without bleeding: Secondary | ICD-10-CM | POA: Diagnosis not present

## 2019-10-17 DIAGNOSIS — M5410 Radiculopathy, site unspecified: Secondary | ICD-10-CM | POA: Diagnosis not present

## 2019-10-17 DIAGNOSIS — J439 Emphysema, unspecified: Secondary | ICD-10-CM | POA: Diagnosis not present

## 2019-10-17 DIAGNOSIS — Z9181 History of falling: Secondary | ICD-10-CM | POA: Diagnosis not present

## 2019-10-17 DIAGNOSIS — G9341 Metabolic encephalopathy: Secondary | ICD-10-CM | POA: Diagnosis not present

## 2019-10-21 ENCOUNTER — Other Ambulatory Visit: Payer: Self-pay | Admitting: *Deleted

## 2019-10-21 DIAGNOSIS — G9341 Metabolic encephalopathy: Secondary | ICD-10-CM | POA: Diagnosis not present

## 2019-10-21 DIAGNOSIS — Z9181 History of falling: Secondary | ICD-10-CM | POA: Diagnosis not present

## 2019-10-21 DIAGNOSIS — F10131 Alcohol abuse with withdrawal delirium: Secondary | ICD-10-CM | POA: Diagnosis not present

## 2019-10-21 DIAGNOSIS — J439 Emphysema, unspecified: Secondary | ICD-10-CM | POA: Diagnosis not present

## 2019-10-21 DIAGNOSIS — K2100 Gastro-esophageal reflux disease with esophagitis, without bleeding: Secondary | ICD-10-CM | POA: Diagnosis not present

## 2019-10-21 DIAGNOSIS — M5410 Radiculopathy, site unspecified: Secondary | ICD-10-CM | POA: Diagnosis not present

## 2019-10-21 NOTE — Patient Outreach (Signed)
[place] [facility]) Care Management  10/21/2019  [name] May 22, 1966 [id number]   EMMI-GENERAL DISCHARGE RED ON EMMI ALERT Day #1 Date: 10/18/2019 Red Alert Reason: No follow up appointment, Unfilled prescriptions  Outreach #1 RN spoke with pt who indicates [facility] will be visiting today ([place]). They will review his medications and follow for COPD education.  Reports a follow up with with his primary provided on 3/4 and he will mention one inhaler he is not using due to the side effects. Will ill also inform the visited nurse. No immediate issues however due to pt's recent hospitalization with COPD offered to enroll into the [facility] program for service. Pt was receptive to enrollment as noted below with further assessment. Call ended as pt had a visit with the [facility] scheduled.   Telephone Assessment  RN further discuss the risk involved related to needed changes in managing his COPD. Pt is aware and willing to enroll and participate in managing his COPD. Pt states he wants to quit smoking. RN offered several smoking cessation programs and further discussed available patches OTC or prescriptions based upon a discussion pt to have with his providers. Discussed enrolling into the COPD program with month ongoing follow up call for continuous education along with long and short term goals with available interventions. Offered to send printed education material to assist with managing this condition. Pt receptive and provider a some information.   Plan of care generated and discussed in detail with the pt. Pt aware his provider will be updated on his disposition with [facility] services. Will follow up in 2 weeks to completed the initial assessment and continue to educate on this condition. However pt has agreed to monthly follow up calls at this time due to [facility] involvement.   THN CM Care Plan Problem One     Most Recent Value  Care Plan Problem One  Knowledge Deficit related  to unfamiliarity with information/education  Role Documenting the Problem One  Care Management Telephonic Coordinator  Care Plan for Problem One  Active  THN Long Term Goal   Pt will verbalize the plan of action in the YELLOw zone within the next 90 days.  THN Long Term Goal Start Date  10/21/19  Interventions for Problem One Long Term Goal  Will discuss and educate on the COPD action plan and provide educational information via mail for ongoing increase knowledge base. Will educate on what to do if acute symptoms should occur for early interventions.  THN CM Short Term Goal #1   Aderence with all schdeuled medical appointments within the next 30 days  THN CM Short Term Goal #1 Start Date  10/21/19  Interventions for Short Term Goal #1  Will discuss the importance of attending all medical appointments due to his recent discharge for review of medications and ongoing plan of care related to his diagnosis. Will strongly enocuraged pt to attend all appointments and verify pt has sufficient transportation.  THN CM Short Term Goal #2   Aderence with medication administration within the next 30 days.  THN CM Short Term Goal #2 Start Date  10/21/19  Interventions for Short Term Goal #2  WIll discuss the importance of taking all prescribed medications to avoid acute issues from occurring. Will offer to review all mediations and verfity pt has all his medications and taking accordingly. Will encouraged pt to take his medications as prescribed.      Raina Mina, RN Care Management Coordinator [place]  Office [phone]

## 2019-10-22 ENCOUNTER — Other Ambulatory Visit: Payer: Self-pay | Admitting: *Deleted

## 2019-10-22 NOTE — Patient Outreach (Signed)
[facility]) Care Management  10/22/2019  [name] [birth year] [id number]    EMMI-COPD RED ON EMMI ALERT Day #1 Date:10/21/2019 Red Alert Reason: color change in mucus  Outreach on this emmi #1 RN unsuccessful in reaching the pt today and unable to leave a message.  Plan: Will continue to follow up accordingly on this emmi. Note pt has been enrolled into the COPD program for disease management services.  Elliot Cousin, RN Care Management Coordinator [facility] Main Office [phone]

## 2019-10-23 DIAGNOSIS — I10 Essential (primary) hypertension: Secondary | ICD-10-CM | POA: Diagnosis not present

## 2019-10-23 DIAGNOSIS — M9983 Other biomechanical lesions of lumbar region: Secondary | ICD-10-CM | POA: Diagnosis not present

## 2019-10-23 DIAGNOSIS — M4316 Spondylolisthesis, lumbar region: Secondary | ICD-10-CM | POA: Diagnosis not present

## 2019-10-23 DIAGNOSIS — M4150 Other secondary scoliosis, site unspecified: Secondary | ICD-10-CM | POA: Diagnosis not present

## 2019-10-24 DIAGNOSIS — G9341 Metabolic encephalopathy: Secondary | ICD-10-CM | POA: Diagnosis not present

## 2019-10-24 DIAGNOSIS — M5410 Radiculopathy, site unspecified: Secondary | ICD-10-CM | POA: Diagnosis not present

## 2019-10-24 DIAGNOSIS — F10131 Alcohol abuse with withdrawal delirium: Secondary | ICD-10-CM | POA: Diagnosis not present

## 2019-10-24 DIAGNOSIS — K2100 Gastro-esophageal reflux disease with esophagitis, without bleeding: Secondary | ICD-10-CM | POA: Diagnosis not present

## 2019-10-24 DIAGNOSIS — J439 Emphysema, unspecified: Secondary | ICD-10-CM | POA: Diagnosis not present

## 2019-10-24 DIAGNOSIS — Z9181 History of falling: Secondary | ICD-10-CM | POA: Diagnosis not present

## 2019-10-27 ENCOUNTER — Other Ambulatory Visit: Payer: Self-pay | Admitting: *Deleted

## 2019-10-27 NOTE — Patient Outreach (Signed)
[facility]) Care Management  10/27/2019  [name] 04-16-1966 [id number]   EMMI-COPD-RESOLVED RED ON EMMI ALERT Day #1,5 Date: 3/2, 3/6 Red Alert Reason:  Mucus changes, 3 X used on his inhaler   Outreach#2 RN spoke with pt concernng the above emmi. Pt reports these issues are all resolved with no additional problems. RN educated pt on mucus changes from his normal and to report any worsening symptoms along with the color of his mucus if symptomatic to his provider. RN also inquired and offered smoking cessation program. Pt continue to states this is something he is continue to think about but did not wish to make a decision at this time.  Note pt is enrolled with [facility] for COPD disease management program.  Elliot Cousin, RN Care Management Coordinator [facility] [employer] Main Office [phone]

## 2019-10-28 ENCOUNTER — Ambulatory Visit: Payer: Medicare Other | Admitting: *Deleted

## 2019-10-29 ENCOUNTER — Other Ambulatory Visit: Payer: Self-pay | Admitting: *Deleted

## 2019-10-29 DIAGNOSIS — F10131 Alcohol abuse with withdrawal delirium: Secondary | ICD-10-CM | POA: Diagnosis not present

## 2019-10-29 DIAGNOSIS — J439 Emphysema, unspecified: Secondary | ICD-10-CM | POA: Diagnosis not present

## 2019-10-29 DIAGNOSIS — Z9181 History of falling: Secondary | ICD-10-CM | POA: Diagnosis not present

## 2019-10-29 DIAGNOSIS — K2100 Gastro-esophageal reflux disease with esophagitis, without bleeding: Secondary | ICD-10-CM | POA: Diagnosis not present

## 2019-10-29 DIAGNOSIS — M5410 Radiculopathy, site unspecified: Secondary | ICD-10-CM | POA: Diagnosis not present

## 2019-10-29 DIAGNOSIS — G9341 Metabolic encephalopathy: Secondary | ICD-10-CM | POA: Diagnosis not present

## 2019-10-29 NOTE — Patient Outreach (Signed)
[facility]) Care Management  10/29/2019  LOTHAR PREHN Jan 05, 1966 [id number]    EMMI- GENERAL DISCHARGE RED ON EMMI ALERT Day #8 Date: 3/9 Red Alert Reason: Used inhaler 3 times, Mucus change  Outreach#1 RN spoke with pt once again today on his recent EMMI noted above. RN was able to verify resolution on the issues noted with no additional needs at this time. Pt clarified that his mucus was clear and he has only used his inhaler on that date once. No distress or breathing issues at this time as pt continue to smoke.  Plan: Will closed this emmi with no additional or needs.  Elliot Cousin, RN Care Management Coordinator [facility] Main Office [phone]

## 2019-10-30 DIAGNOSIS — E538 Deficiency of other specified B group vitamins: Secondary | ICD-10-CM | POA: Diagnosis not present

## 2019-10-30 DIAGNOSIS — E782 Mixed hyperlipidemia: Secondary | ICD-10-CM | POA: Diagnosis not present

## 2019-10-30 DIAGNOSIS — Z23 Encounter for immunization: Secondary | ICD-10-CM | POA: Diagnosis not present

## 2019-10-30 DIAGNOSIS — J449 Chronic obstructive pulmonary disease, unspecified: Secondary | ICD-10-CM | POA: Diagnosis not present

## 2019-10-30 DIAGNOSIS — D539 Nutritional anemia, unspecified: Secondary | ICD-10-CM | POA: Diagnosis not present

## 2019-11-03 ENCOUNTER — Other Ambulatory Visit: Payer: Self-pay | Admitting: *Deleted

## 2019-11-03 NOTE — Patient Outreach (Signed)
[facility]) Care Management  11/03/2019  [name] 22-Feb-1966 [id number]    EMMI-COPD-RESOLVED RED ON EMMI ALERT Day #8 & 11 Date: 3/9 & 3/12 Red Alert Reason: Change in Mucus and use of inhaler  Outreach #1 RN spoke with pt and confirmed the information above. States his mucus is clear and he was correct on the use of his inhalers. Pt states he continues to smoke and this is why he is using his inhalers with a change in his color of mucus. Based upon this information RN was contact the pt daily to every other day with these symptoms. Pt wishes to be removed from the daily COPD emmi at this time because he is not ready to quit smoking and will continue to have these symptoms. RN verified pt is aware if symptoms get worse to contact his provider with any acute symptoms related.  Plan: Will close these EMMIs as resolved with no additional needs.  Elliot Cousin, RN Care Management Coordinator [facility] Main Office [phone]

## 2019-11-05 DIAGNOSIS — M5410 Radiculopathy, site unspecified: Secondary | ICD-10-CM | POA: Diagnosis not present

## 2019-11-05 DIAGNOSIS — F10131 Alcohol abuse with withdrawal delirium: Secondary | ICD-10-CM | POA: Diagnosis not present

## 2019-11-05 DIAGNOSIS — Z9181 History of falling: Secondary | ICD-10-CM | POA: Diagnosis not present

## 2019-11-05 DIAGNOSIS — J439 Emphysema, unspecified: Secondary | ICD-10-CM | POA: Diagnosis not present

## 2019-11-05 DIAGNOSIS — K2100 Gastro-esophageal reflux disease with esophagitis, without bleeding: Secondary | ICD-10-CM | POA: Diagnosis not present

## 2019-11-05 DIAGNOSIS — G9341 Metabolic encephalopathy: Secondary | ICD-10-CM | POA: Diagnosis not present

## 2019-11-07 ENCOUNTER — Other Ambulatory Visit: Payer: Self-pay | Admitting: *Deleted

## 2019-11-07 NOTE — Patient Outreach (Signed)
[facility]) Care Management  11/07/2019  [name] March 02, 1966 [id number]    Telephone Assessment   RN spoke with pt today to inquired further on completing the initial assessment however today was not a convenient day to complete this task. Pt requested a call back on Monday morning.  Plan: RN will follow up on Monday as requested to completed the initial assessment information that was planned for today.  Danny Cousin, RN Care Management Coordinator [facility] Main Office [phone]

## 2019-11-11 ENCOUNTER — Other Ambulatory Visit: Payer: Self-pay | Admitting: *Deleted

## 2019-11-11 DIAGNOSIS — F10131 Alcohol abuse with withdrawal delirium: Secondary | ICD-10-CM | POA: Diagnosis not present

## 2019-11-11 DIAGNOSIS — K2100 Gastro-esophageal reflux disease with esophagitis, without bleeding: Secondary | ICD-10-CM | POA: Diagnosis not present

## 2019-11-11 DIAGNOSIS — G9341 Metabolic encephalopathy: Secondary | ICD-10-CM | POA: Diagnosis not present

## 2019-11-11 DIAGNOSIS — Z9181 History of falling: Secondary | ICD-10-CM | POA: Diagnosis not present

## 2019-11-11 DIAGNOSIS — J439 Emphysema, unspecified: Secondary | ICD-10-CM | POA: Diagnosis not present

## 2019-11-11 DIAGNOSIS — M5410 Radiculopathy, site unspecified: Secondary | ICD-10-CM | POA: Diagnosis not present

## 2019-11-11 NOTE — Patient Outreach (Signed)
[facility]) Care Management  11/11/2019  [name] 10-24-1965 [id number]    Telephone Assessment  RN spoke briefly with pt today who currently is with company and unable to talk. RN inquired on another time or day to call back. Pt request a call back on tomorrow at a specific time. RN will follow up accordingly with pt's request.   Plan: Will follow up once again on tomorrow with a call to complete the initial assessment.  Elliot Cousin, RN Care Management Coordinator [facility] Main Office [phone]

## 2019-11-12 ENCOUNTER — Other Ambulatory Visit: Payer: Self-pay | Admitting: *Deleted

## 2019-11-12 NOTE — Patient Outreach (Signed)
[facility]) Care Management  11/12/2019  UMER HARIG 1966-02-01 [id number]    Telephone Assessment-Unsuccessful  RN attempt outreach call today as requested however pt was not available and RN unable to leave a HIPAA message to his contact.  Plan: Will scheduled another follow up call over the next week and inquiry further on the obtaining the initial assessment.  Elliot Cousin, RN Care Management Coordinator [facility] Main Office [phone]

## 2019-11-20 ENCOUNTER — Other Ambulatory Visit: Payer: Self-pay | Admitting: *Deleted

## 2019-11-20 NOTE — Patient Outreach (Signed)
[facility]) Care Management  11/20/2019  [name] 1966-05-30 [id number]    Telephone Assessment-Unsuccessful  RN attempted outreach call to pt today however unsuccessful. RN able to leave a HIPAA approved voice message requesting a call back.  Plan: Due to the unsuccessful call backs requested by pt will send outreach letter and plan to close case if no response.  Elliot Cousin, RN Care Management Coordinator [facility] Main Office [phone]

## 2019-11-25 ENCOUNTER — Other Ambulatory Visit: Payer: Self-pay | Admitting: *Deleted

## 2019-11-25 ENCOUNTER — Encounter: Payer: Self-pay | Admitting: *Deleted

## 2019-11-25 DIAGNOSIS — E538 Deficiency of other specified B group vitamins: Secondary | ICD-10-CM | POA: Diagnosis not present

## 2019-11-25 DIAGNOSIS — D539 Nutritional anemia, unspecified: Secondary | ICD-10-CM | POA: Diagnosis not present

## 2019-11-25 DIAGNOSIS — J449 Chronic obstructive pulmonary disease, unspecified: Secondary | ICD-10-CM | POA: Diagnosis not present

## 2019-11-25 DIAGNOSIS — E782 Mixed hyperlipidemia: Secondary | ICD-10-CM | POA: Diagnosis not present

## 2019-11-25 DIAGNOSIS — Z720 Tobacco use: Secondary | ICD-10-CM | POA: Diagnosis not present

## 2019-11-25 NOTE — Patient Outreach (Signed)
[place] [facility]) Care Management  11/25/2019  CYLE KENYON 1965/12/08 [id number]    Telephone Assessment  RN attempted to speak with pt earlier today however requested to call back later in the day. RN returned a second call to pt and was able to obtain and completed the initial assessment as planned. Pt reports he is doing well with no acute events and continue to manage is COPD. Pt reports receiving the educational packet sent last month and encouraged to review the material.  Verified pt remains in the GREEN zone with precipitating symptoms. Denies any swelling, SOB with coughing and reports occasional sputum that is clear in coloration. Pt reports an office visit today with his primary provider with a good report. Pt states he will not follow up again until July. Pt continue to smoke and aware of stop cessation classes and/or medications both prescription or OTC can assist when he is ready to stop. Pt aware of th risk involved with his ongoing COPD.   Discussed the currently plan of care once again and updated goals and interventions accordingly. Will follow up next month in the afternoon as requesting by the pt. No other needs or issues to address at this time.   THN CM Care Plan Problem One     Most Recent Value  Care Plan Problem One  Knowledge Deficit related to unfamiliarity with information/education  Role Documenting the Problem One  Care Management Telephonic Coordinator  Care Plan for Problem One  Active  THN Long Term Goal   Pt will verbalize the plan of action in the YELLOw zone within the next 90 days.  THN Long Term Goal Start Date  10/21/19  Interventions for Problem One Long Term Goal  Reitirated on the COPD action plan and verify pt  is symptoms free from any of the related symptoms. Verified pt received the educational packet from [facility] on COPD and strongly encouraged t ot review this material. Verified pt is aware of who to call if destressful occur and wht  to do ith use of her PRN inhaler/medications if prescribed.  THN CM Short Term Goal #1   Aderence with all schdeuled medical appointments within the next 30 days  THN CM Short Term Goal #1 Start Date  10/21/19  [facility] CM Short Term Goal #1 Met Date  11/25/19  THN CM Short Term Goal #2   Aderence with medication administration within the next 30 days.  THN CM Short Term Goal #2 Start Date  10/21/19  [facility] CM Short Term Goal #2 Met Date  11/25/19      Raina Mina, RN Care Management Coordinator [place] Office [phone]

## 2019-12-04 DIAGNOSIS — M48061 Spinal stenosis, lumbar region without neurogenic claudication: Secondary | ICD-10-CM | POA: Diagnosis not present

## 2019-12-30 ENCOUNTER — Other Ambulatory Visit: Payer: Self-pay | Admitting: *Deleted

## 2019-12-30 NOTE — Patient Outreach (Signed)
[facility]) Care Management  12/30/2019  SANDERS MANNINEN 10/20/1965 [id number]  Telephone Assessment-COPD  RN spoke with pt today and received an update. Pt states he has not had to use his emergent inhalers and continue to take all medications as prescribed. Denies any acute issues and pt continue to receive assistance from his neighbor, brother and live in sister Talbert [place] when needed. Pt reports attending all his medical appointments and taking all his prescribed medications with no needed refills at this time. No acute issues or problems since RN case manager's last conversation and pt continue to do well with no acute symptoms over the last month.  Plan of care discussed along with goals and interventions. Intervention adjusted according to pt's progress with no acute issues. Will continue to encourage pt to continue his ongoing management of care and to contact his provider with any abnormal symptoms that continue to persist and affect his breathing.  No other needs at this time as RN will continue to follow up accordingly with inquires or related questions.  THN CM Care Plan Problem One     Most Recent Value  Care Plan Problem One  Knowledge Deficit related to unfamiliarity with information/education  Role Documenting the Problem One  Care Management Telephonic Coordinator  Care Plan for Problem One  Active  THN Long Term Goal   Pt will verbalize the plan of action in the YELLOw zone within the next 90 days.  THN Long Term Goal Start Date  10/21/19  Interventions for Problem One Long Term Goal  Will continue to review the COPD action plan and attempt ongoing teachback recall on the information provided. Will continue to work with pt on what to do if acute symptoms should occur. Will re-evaluate next follow up call on pt's ongoing knowledge base.      Elliot Cousin, RN Care Management Coordinator [facility] Main Office [phone]

## 2020-01-22 DIAGNOSIS — M9983 Other biomechanical lesions of lumbar region: Secondary | ICD-10-CM | POA: Diagnosis not present

## 2020-01-22 DIAGNOSIS — M4150 Other secondary scoliosis, site unspecified: Secondary | ICD-10-CM | POA: Diagnosis not present

## 2020-01-30 ENCOUNTER — Other Ambulatory Visit: Payer: Self-pay | Admitting: *Deleted

## 2020-01-30 NOTE — Patient Outreach (Signed)
[facility]) Care Management  01/30/2020  [name] [birth year] [id number]   Telephone Assessment-Successful  RN spoke with pt today and received an update on pt's ongoing management of care. Pt reports he is pending an appointment with his primary provider this month and will need refills on his inhaler at that time. RN educated pt on requesting any of his medications at least 2 weeks ahead for ongoing supplies. Pt agreed due to usage of his inhaler daily. RN reviewed his plan of care and discussed his goal and intervention on the existing long term goal. Pt receptive and verbalized an understanding. Will adjust the intervention based upon pt's progress. Will also update the provider on pt's ongoing progress.  Plan: Will follow up next month and discuss possible transitioning over to a Health Coach for ongoing [facility] services based upon his success in managing his care. Pt hesitant but understands this process as his progressing in managing his care with the available programs.   THN CM Care Plan Problem One     Most Recent Value  Care Plan Problem One Knowledge Deficit related to COPD unfamiliarity with information/education  Role Documenting the Problem One Care Management Telephonic Coordinator  Care Plan for Problem One Active  THN Long Term Goal  Pt will verbalize the plan of action in the YELLOw zone within the next 90 days.  THN Long Term Goal Start Date 10/21/19  Interventions for Problem One Long Term Goal Will continue to educate on the COPD action plan and thoroughly review the GREEN/YELLOW/RED zones and what pt should do if acute. Will verified pt continues to review the [facility] packet that has this information printed for ongoing increase in his knowledge base.       Elliot Cousin, RN Care Management Coordinator [facility] Main Office [phone]

## 2020-03-01 ENCOUNTER — Other Ambulatory Visit: Payer: Self-pay | Admitting: *Deleted

## 2020-03-01 NOTE — Patient Outreach (Signed)
[facility]) Care Management  03/01/2020  DEDRIC ETHINGTON [birth year] [id number]   Telephone Assessment-Successful (COPD)  RN spoke with pt today concerning his ongoing management of care. Pt states she is doing "fine" however started new inhalers and doing much better. Pt states he has a provider's appointment on Thursday and will requested the needed refills for some of his ongoing medications. Pt has most but still needs several. No acute issues at this time however pt reports he will need to undergo the recommended surgery to avoid ongoing back injections and will discuss this with his provider on Thursday. Will continue to reiterated on the COPD action plan for ongoing knowledge base and verify pt remains in the GREEN zone today.  Plan: Will update the current plan and refer to a health Coach for ongoing COPD management.  THN CM Care Plan Problem One     Most Recent Value  Care Plan Problem One Knowledge Deficit related to COPD unfamiliarity with information/education  Role Documenting the Problem One Care Management Telephonic Coordinator  Care Plan for Problem One Active  THN Long Term Goal  Pt will verbalize the plan of action in the YELLOw zone within the next 90 days.  THN Long Term Goal Start Date 10/21/19  Interventions for Problem One Long Term Goal Pt continue to need review for his COPD       Elliot Cousin, RN Care Management Coordinator [facility] [employer] Main Office [phone]

## 2020-03-04 DIAGNOSIS — M48061 Spinal stenosis, lumbar region without neurogenic claudication: Secondary | ICD-10-CM | POA: Diagnosis not present

## 2020-03-18 DIAGNOSIS — Z136 Encounter for screening for cardiovascular disorders: Secondary | ICD-10-CM | POA: Diagnosis not present

## 2020-03-18 DIAGNOSIS — Z01021 Encounter for examination of eyes and vision following failed vision screening with abnormal findings: Secondary | ICD-10-CM | POA: Diagnosis not present

## 2020-03-18 DIAGNOSIS — E782 Mixed hyperlipidemia: Secondary | ICD-10-CM | POA: Diagnosis not present

## 2020-03-18 DIAGNOSIS — Z131 Encounter for screening for diabetes mellitus: Secondary | ICD-10-CM | POA: Diagnosis not present

## 2020-03-18 DIAGNOSIS — Z0001 Encounter for general adult medical examination with abnormal findings: Secondary | ICD-10-CM | POA: Diagnosis not present

## 2020-03-18 DIAGNOSIS — D539 Nutritional anemia, unspecified: Secondary | ICD-10-CM | POA: Diagnosis not present

## 2020-03-18 DIAGNOSIS — Z01118 Encounter for examination of ears and hearing with other abnormal findings: Secondary | ICD-10-CM | POA: Diagnosis not present

## 2020-03-24 ENCOUNTER — Encounter: Payer: Self-pay | Admitting: *Deleted

## 2020-03-24 ENCOUNTER — Other Ambulatory Visit: Payer: Self-pay | Admitting: *Deleted

## 2020-03-24 NOTE — Patient Outreach (Signed)
[facility]) Care Management  [facility] Care Manager  03/24/2020   [name] 1966-03-07 [id number]  Subjective: Successful telephone outreach call to patient. HIPAA identifiers obtained. Patient states that he has had issues with his hernia that is located above his belly button. Patient explained that he has an appointment with a surgeon on 03/25/20 to discuss surgery and is hoping that it will be scheduled soon. Patient states that he does use his rescue inhaler nightly which is his baseline. He states that he is currently having no difficulty breathing and feels that his COPD is well controlled. Patient reports that he is emotionally in a good place and has good support from his family and voiced no other concerns at this time.  Encounter Medications:  Outpatient Encounter Medications as of 03/24/2020  Medication Sig Note   albuterol (PROVENTIL HFA;VENTOLIN HFA) 108 (90 Base) MCG/ACT inhaler Inhale 2 puffs into the lungs every 4 (four) hours as needed for wheezing or shortness of breath.    Aspirin-Caffeine (BC FAST PAIN RELIEF PO) Take 1 packet by mouth every 6 (six) hours as needed (for headaches).    gabapentin (NEURONTIN) 300 MG capsule Take 300 mg by mouth 3 (three) times daily.     hydrocortisone (ANUSOL-HC) 25 MG suppository Place 1 suppository (25 mg total) rectally every 12 (twelve) hours.    Multiple Vitamins-Minerals (MULTIVITAMIN WITH MINERALS) tablet Take 1 tablet by mouth daily.    pantoprazole (PROTONIX) 40 MG tablet Take 1 tablet (40 mg total) by mouth daily.    thiamine 100 MG tablet Take 1 tablet (100 mg total) by mouth daily.    tiZANidine (ZANAFLEX) 2 MG tablet Take 1-2 tablets by mouth every 8 (eight) hours as needed for muscle spasms.     meloxicam (MOBIC) 7.5 MG tablet Take 7.5 mg by mouth daily.  (Patient not taking: Reported on 03/01/2020) 03/24/2020: completed   PHENobarbital (LUMINAL) 32.4 MG tablet Take 1 tablet (32.4 mg total) by mouth 3  (three) times daily for 2 days, THEN 0.5 tablets (16.2 mg total) 3 (three) times daily for 2 days.    No facility-administered encounter medications on file as of 03/24/2020.    Functional Status:  In your present state of health, do you have any difficulty performing the following activities: 11/25/2019 10/11/2019  Hearing? N N  Vision? N N  Difficulty concentrating or making decisions? N N  Walking or climbing stairs? N Y  Dressing or bathing? N N  Doing errands, shopping? Y N  Comment Family assist -  Preparing Food and eating ? N -  Using the Toilet? N -  In the past six months, have you accidently leaked urine? N -  Do you have problems with loss of bowel control? N -  Managing your Medications? N -  Managing your Finances? N -  Housekeeping or managing your Housekeeping? N -  Some recent data might be hidden    Fall/Depression Screening: Fall Risk  11/25/2019  Falls in the past year? 0   PHQ 2/9 Scores 03/24/2020 11/25/2019 03/18/2019 11/17/2015  PHQ - 2 Score 0 0 0 0   Goals Addressed            This Visit's Progress    Patient will not go to the ED or be admitted to the hospital for COPD exacerbation within the next 90 days       CARE PLAN ENTRY (see longtitudinal plan of care for additional care plan information)  Current Barriers:  Knowledge deficits related to basic understanding of COPD disease process  Knowledge deficits related to basic COPD self care/management   Case Manager Clinical Goal(s):  Over the next 90 days, patient will be able to verbalize understanding of COPD action plan and when to seek appropriate levels of medical care  Over the next 90 days, patient will verbalize basic understanding of COPD disease process and self care activities  Over the next 90 days, patient will not be hospitalized for COPD exacerbation   Interventions:   Provided patient with basic written and verbal COPD education on self care/management/and exacerbation prevention    Patient Self Care Activities:   Takes medications as prescribed including inhalers  Self assesses COPD action plan zone and makes appointment with provider if in the yellow zone for 48 hours without improvement.  Does not adhere to provider recommendations re:  smoking cessation  Initial goal documentation       Plan: RN Health Coach will send PCP a Barrier letter and today's assessment note, will send patient education regarding hernia surgery and a [facility] Welcome packet, call patient within the month of October, and patient agrees to future outreach calls.   [name] RN, BSN [facility] Care Management  RN Health Coach [phone] [email or web address]

## 2020-03-25 DIAGNOSIS — K439 Ventral hernia without obstruction or gangrene: Secondary | ICD-10-CM | POA: Diagnosis not present

## 2020-04-07 DIAGNOSIS — Z01812 Encounter for preprocedural laboratory examination: Secondary | ICD-10-CM | POA: Diagnosis not present

## 2020-04-07 DIAGNOSIS — Z01818 Encounter for other preprocedural examination: Secondary | ICD-10-CM | POA: Diagnosis not present

## 2020-04-07 DIAGNOSIS — K439 Ventral hernia without obstruction or gangrene: Secondary | ICD-10-CM | POA: Diagnosis not present

## 2020-04-07 DIAGNOSIS — Z01810 Encounter for preprocedural cardiovascular examination: Secondary | ICD-10-CM | POA: Diagnosis not present

## 2020-04-09 DIAGNOSIS — G629 Polyneuropathy, unspecified: Secondary | ICD-10-CM | POA: Diagnosis not present

## 2020-04-09 DIAGNOSIS — K439 Ventral hernia without obstruction or gangrene: Secondary | ICD-10-CM | POA: Diagnosis not present

## 2020-04-09 DIAGNOSIS — J449 Chronic obstructive pulmonary disease, unspecified: Secondary | ICD-10-CM | POA: Diagnosis not present

## 2020-04-09 DIAGNOSIS — E785 Hyperlipidemia, unspecified: Secondary | ICD-10-CM | POA: Diagnosis not present

## 2020-04-27 DIAGNOSIS — J449 Chronic obstructive pulmonary disease, unspecified: Secondary | ICD-10-CM | POA: Diagnosis not present

## 2020-04-27 DIAGNOSIS — E782 Mixed hyperlipidemia: Secondary | ICD-10-CM | POA: Diagnosis not present

## 2020-04-27 DIAGNOSIS — D539 Nutritional anemia, unspecified: Secondary | ICD-10-CM | POA: Diagnosis not present

## 2020-04-27 DIAGNOSIS — Z720 Tobacco use: Secondary | ICD-10-CM | POA: Diagnosis not present

## 2020-04-29 DIAGNOSIS — Z8719 Personal history of other diseases of the digestive system: Secondary | ICD-10-CM | POA: Insufficient documentation

## 2020-05-12 DIAGNOSIS — M4316 Spondylolisthesis, lumbar region: Secondary | ICD-10-CM | POA: Diagnosis not present

## 2020-05-12 DIAGNOSIS — M4150 Other secondary scoliosis, site unspecified: Secondary | ICD-10-CM | POA: Diagnosis not present

## 2020-05-12 DIAGNOSIS — M9983 Other biomechanical lesions of lumbar region: Secondary | ICD-10-CM | POA: Diagnosis not present

## 2020-05-25 ENCOUNTER — Ambulatory Visit: Payer: Self-pay | Admitting: *Deleted

## 2020-05-27 ENCOUNTER — Other Ambulatory Visit: Payer: Self-pay | Admitting: *Deleted

## 2020-05-27 NOTE — Patient Outreach (Signed)
[facility]) Care Management  05/27/2020  ROMELLO HOEHN [birth year] [id number]  Outreach attempt to patient. No answer and unable to leave voicemail message.  Plan: RN Health Coach will call patient within the month of November.   [name] RN, BSN [facility] Care Management  RN Health Coach [phone] [email or web address]

## 2020-06-08 DIAGNOSIS — Z1389 Encounter for screening for other disorder: Secondary | ICD-10-CM | POA: Diagnosis not present

## 2020-06-08 DIAGNOSIS — J449 Chronic obstructive pulmonary disease, unspecified: Secondary | ICD-10-CM | POA: Diagnosis not present

## 2020-06-08 DIAGNOSIS — E559 Vitamin D deficiency, unspecified: Secondary | ICD-10-CM | POA: Diagnosis not present

## 2020-06-08 DIAGNOSIS — R7401 Elevation of levels of liver transaminase levels: Secondary | ICD-10-CM | POA: Diagnosis not present

## 2020-06-08 DIAGNOSIS — Z0001 Encounter for general adult medical examination with abnormal findings: Secondary | ICD-10-CM | POA: Diagnosis not present

## 2020-06-08 DIAGNOSIS — Z23 Encounter for immunization: Secondary | ICD-10-CM | POA: Diagnosis not present

## 2020-06-14 DIAGNOSIS — M5416 Radiculopathy, lumbar region: Secondary | ICD-10-CM | POA: Diagnosis not present

## 2020-07-13 DIAGNOSIS — M9983 Other biomechanical lesions of lumbar region: Secondary | ICD-10-CM | POA: Diagnosis not present

## 2020-07-13 DIAGNOSIS — M5416 Radiculopathy, lumbar region: Secondary | ICD-10-CM | POA: Diagnosis not present

## 2020-07-14 ENCOUNTER — Other Ambulatory Visit: Payer: Self-pay | Admitting: *Deleted

## 2020-07-14 NOTE — Patient Outreach (Signed)
[name]) Care Management  07/14/2020  [name] Nov 25, 1965 [id number]  Unsuccessful outreach attempt made to patient. Patient answered the phone and explained he was getting ready to leave his house and could not talk today. He requested that this nurse call him back at a later date.   Plan: RN Health Coach will call patient within the month of December.  [name] RN, BSN [facility] Care Management  RN Health Coach [phone] [name]

## 2020-08-19 ENCOUNTER — Other Ambulatory Visit: Payer: Self-pay | Admitting: *Deleted

## 2020-08-19 NOTE — Patient Outreach (Signed)
[facility]) Care Management  [facility] Care Manager  08/19/2020   [name] Jul 21, 1966 [id number]  Subjective: Successful telephone outreach call to patient. HIPAA identifiers obtained. Patient states he wants to gain weight for his overall health and because his back surgeon stated he would have to gain weight before he could safely perform back surgery. Patient discussed his appetite decreases when he is drinking alcohol or "my beer". Patient admits that he is an alcoholic and states he does not want to quit drinking at this time. Patient explains that he has cut back on the amount of beer he drinks daily to 3-4, 45 ounce bottled beer. Patient states that he will try to drink Ensure daily. Patient will weigh 2-3 times weekly to be able to measure his success or failure. Patient will try to eat at least one large meal daily. Nurse will send Ensure coupons and did review food items that if consumed will help to increase his weight.   Patient explains that his back pain radiates down his leg and it is becoming difficult to perform IADLs. He asked if he would be eligible for any assistance. Patient does have Medicaid and nurse will place a CSW referral to see if the patient will qualify for assistance. Patient states that he is currently having no difficulty breathing and feels that his COPD is well controlled. He is using his rescue inhaler nightly which is his baseline. Patient reports that he does want to try to quit smoking and would like this nurse to send him 1-800-quit-now resource to assist. Patient did not have any further questions or concerns today and did confirm that she has this nurse's contact number to call her if needed.   Encounter Medications:  Outpatient Encounter Medications as of 08/19/2020  Medication Sig Note  . albuterol (PROVENTIL HFA;VENTOLIN HFA) 108 (90 Base) MCG/ACT inhaler Inhale 2 puffs into the lungs every 4 (four) hours as needed for wheezing or  shortness of breath.   . Aspirin-Caffeine (BC FAST PAIN RELIEF PO) Take 1 packet by mouth every 6 (six) hours as needed (for headaches).   . gabapentin (NEURONTIN) 300 MG capsule Take 300 mg by mouth 3 (three) times daily.    Marland Kitchen thiamine 100 MG tablet Take 1 tablet (100 mg total) by mouth daily.   Marland Kitchen tiZANidine (ZANAFLEX) 2 MG tablet Take 1-2 tablets by mouth every 8 (eight) hours as needed for muscle spasms.    . meloxicam (MOBIC) 7.5 MG tablet Take 7.5 mg by mouth daily. (Patient not taking: Reported on 08/19/2020) 08/19/2020: completed  . pantoprazole (PROTONIX) 40 MG tablet Take 1 tablet (40 mg total) by mouth daily.   Marland Kitchen PHENobarbital (LUMINAL) 32.4 MG tablet Take 1 tablet (32.4 mg total) by mouth 3 (three) times daily for 2 days, THEN 0.5 tablets (16.2 mg total) 3 (three) times daily for 2 days.    No facility-administered encounter medications on file as of 08/19/2020.    Functional Status:  In your present state of health, do you have any difficulty performing the following activities: 11/25/2019 10/11/2019  Hearing? N N  Vision? N N  Difficulty concentrating or making decisions? N N  Walking or climbing stairs? N Y  Dressing or bathing? N N  Doing errands, shopping? Y N  Comment Family assist -  Preparing Food and eating ? N -  Using the Toilet? N -  In the past six months, have you accidently leaked urine? N -  Do you have problems  with loss of bowel control? N -  Managing your Medications? N -  Managing your Finances? N -  Housekeeping or managing your Housekeeping? N -  Some recent data might be hidden    Fall/Depression Screening: Fall Risk  11/25/2019  Falls in the past year? 0   PHQ 2/9 Scores 03/24/2020 11/25/2019 03/18/2019 11/17/2015  PHQ - 2 Score 0 0 0 0    Assessment:  Goals Addressed            This Visit's Progress   . Gain weight       Timeframe:  Long-Range Goal Priority:  High Start Date:  08/19/20                           Expected End Date:   02/17/21 Follow-up date: 11/17/20                      Patient reports that he does want to gain weight for his overall health and his back surgeon stated he would need to gain weight before he can have back surgery.   Patient discussed his appetite decreases when he is drinking alcohol or "my beer". Patient admits that he is an alcoholic and states he does not want to quit drinking at this time. Patient explains that he has cut back on the amount of beer he drinks daily to 3-4, 45 ounce bottled beer. Nurse will send Ensure coupons.  Patient states that he will try to drink Ensure daily. Patient will weigh 2-3 times weekly to be able to measure his success or failure. Patient will try to eat at least one large meal daily.    . Patient will not go to the ED or be admitted to the hospital for COPD exacerbation within the next 90 days   On track    CARE PLAN ENTRY (see longtitudinal plan of care for additional care plan information)  Current Barriers:  Marland Kitchen Knowledge deficits related to basic understanding of COPD disease process . Knowledge deficits related to basic COPD self care/management   Case Manager Clinical Goal(s):  Over the next 90 days, patient will be able to verbalize understanding of COPD action plan and when to seek appropriate levels of medical care  Over the next 90 days, patient will verbalize basic understanding of COPD disease process and self care activities  Over the next 90 days, patient will not be hospitalized for COPD exacerbation   Interventions:   Provided patient with basic written and verbal COPD education on self care/management/and exacerbation prevention   Encouraged patient to quit smoking and will send patient 1-800-quit-now resource.  Encouraged patient to continue to walk routinely to help improve lung expansion.  Patient Self Care Activities:  . Takes medications as prescribed including inhalers . Self assesses COPD action plan zone and makes  appointment with provider if in the yellow zone for 48 hours without improvement. .  Patient states he wants to quit smoking and would like for nurse to send him information for resource 1-800-quit-now. . Patient reports walking routinely and most days.    Please see past updates related to this goal by clicking on the "Past Updates" button in the selected goal  Updated: 08/19/21 Follow-up: 11/17/20 Timeframe:  Long-Range Goal Priority:  High Start Date: 03/24/20  Expected End Date: 02/17/21                          . Track and Manage My Symptoms-COPD       Timeframe:  Long-Range Goal Priority:  High Start Date:  08/19/20                           Expected End Date:  02/17/21                     Follow Up Date 11/17/20    - arrange in-home help services - develop a rescue plan - eliminate symptom triggers at home - follow rescue plan if symptoms flare-up - keep follow-up appointments - use an extra pillow to sleep    Why is this important?    Tracking your symptoms and other information about your health helps your doctor plan your care.   Write down the symptoms, the time of day, what you were doing and what medicine you are taking.   You will soon learn how to manage your symptoms.     Notes: Patient states he does want to quit smoking. Nurse will send patient 1-800-quit-now resource.        Plan: RN Health Coach will send PCP today's assessment note, will place a referral to CSW, will send patient smoking cessation resource,COPD education, and Ensure coupons, and will call patient within the month of March. Follow-up:  Patient agrees to Care Plan and Follow-up.    [name] RN, BSN [facility] Care Management  RN Health Coach [phone] [email or web address]

## 2020-08-19 NOTE — Patient Instructions (Signed)
Goals Addressed            This Visit's Progress    Gain weight       Timeframe:  Long-Range Goal Priority:  High Start Date:  08/19/20                           Expected End Date:  02/17/21 Follow-up date: 11/17/20                      Patient reports that he does want to gain weight for his overall health and his back surgeon stated he would need to gain weight before he can have back surgery.   Patient discussed his appetite decreases when he is drinking alcohol or "my beer". Patient admits that he is an alcoholic and states he does not want to quit drinking at this time. Patient explains that he has cut back on the amount of beer he drinks daily to 3-4, 45 ounce bottled beer. Nurse will send Ensure coupons.  Patient states that he will try to drink Ensure daily. Patient will weigh 2-3 times weekly to be able to measure his success or failure. Patient will try to eat at least one large meal daily.     Patient will not go to the ED or be admitted to the hospital for COPD exacerbation within the next 90 days   On track    CARE PLAN ENTRY (see longtitudinal plan of care for additional care plan information)  Current Barriers:   Knowledge deficits related to basic understanding of COPD disease process  Knowledge deficits related to basic COPD self care/management   Case Manager Clinical Goal(s):  Over the next 90 days, patient will be able to verbalize understanding of COPD action plan and when to seek appropriate levels of medical care  Over the next 90 days, patient will verbalize basic understanding of COPD disease process and self care activities  Over the next 90 days, patient will not be hospitalized for COPD exacerbation   Interventions:   Provided patient with basic written and verbal COPD education on self care/management/and exacerbation prevention   Encouraged patient to quit smoking and will send patient 1-800-quit-now resource.  Encouraged patient to continue  to walk routinely to help improve lung expansion.  Patient Self Care Activities:   Takes medications as prescribed including inhalers  Self assesses COPD action plan zone and makes appointment with provider if in the yellow zone for 48 hours without improvement.   Patient states he wants to quit smoking and would like for nurse to send him information for resource 1-800-quit-now.  Patient reports walking routinely and most days.    Please see past updates related to this goal by clicking on the "Past Updates" button in the selected goal  Updated: 08/19/21 Follow-up: 11/17/20 Timeframe:  Long-Range Goal Priority:  High Start Date: 03/24/20                            Expected End Date: 02/17/21                           Track and Manage My Symptoms-COPD       Timeframe:  Long-Range Goal Priority:  High Start Date:  08/19/20  Expected End Date:  02/17/21                     Follow Up Date 11/17/20    - arrange in-home help services - develop a rescue plan - eliminate symptom triggers at home - follow rescue plan if symptoms flare-up - keep follow-up appointments - use an extra pillow to sleep    Why is this important?    Tracking your symptoms and other information about your health helps your doctor plan your care.   Write down the symptoms, the time of day, what you were doing and what medicine you are taking.   You will soon learn how to manage your symptoms.     Notes: Patient states he does want to quit smoking. Nurse will send patient 1-800-quit-now resource.

## 2020-08-27 ENCOUNTER — Other Ambulatory Visit: Payer: Self-pay | Admitting: *Deleted

## 2020-08-27 NOTE — Patient Outreach (Signed)
[facility]) Care Management  08/27/2020  BRIANA NEWMAN 1965-10-20 [id number]  CSW made initial contact with pt today and confirmed pt identity. CSW introduced self, role and reason for call. Per pt, he has full Medicaid and needs in home care/assistance.  CSW discussed the PCS/Personal Care Services program through [employer] and explained the process for applying.  CSW will mail pt a PCS application and advised pt how to complete; one section he fills out and the other sections his PCP has to complete.  CSW suggested to pt that he complete his portion and then take to his PCP visit (which he plans to schedule after receiving the application in mail).  PCP will then need to complete the remainder/PCP portion and fax to [facility] in [place] for determination.      Reece Levy, MSW, LCSW Clinical Social Worker  [facility] [phone]

## 2020-09-13 ENCOUNTER — Other Ambulatory Visit: Payer: Self-pay | Admitting: *Deleted

## 2020-09-13 NOTE — Patient Outreach (Signed)
[facility]) Care Management  09/13/2020  [name] Jun 13, 1966 [id number]  CSW made contact with pt who reports he received the paperwork for the [facility] application.  CSW instructed pt to complete the portion he is to complete and have his Provider complete the other portion and then have the Provider fax to the [facility] office in [place].  CSW will plan to call pt again on 09/17/2020 for updates from [facility] and plans to see if they can complete the Provider section of the application for Medicaid personal care services.    Reece Levy, MSW, LCSW Clinical Social Worker  [facility] [phone]

## 2020-09-16 DIAGNOSIS — M5416 Radiculopathy, lumbar region: Secondary | ICD-10-CM | POA: Diagnosis not present

## 2020-09-17 ENCOUNTER — Ambulatory Visit: Payer: Self-pay | Admitting: *Deleted

## 2020-09-27 ENCOUNTER — Other Ambulatory Visit: Payer: Self-pay | Admitting: *Deleted

## 2020-09-27 NOTE — Patient Outreach (Signed)
[facility]) Care Management  09/27/2020  [name] [birth year] [id number]   CSW made contact with pt today who reports he took the  Personal Care Services, PCS, application paperwork to his spine Dr visit but was told PCP had to complete; as expected.  Pt instructed by CSW to call PCP to request a visit for completion of this paperwork soon (his next PCP visit is in June so a sooner appointment would be beneficial). Pt plans to call PCP today to make appointment and will then also make transportation arrangements. CSW suggested to pt that he complete his portion of the paperwork so that he can leave it with PCP to complete,sign and fax from PCP office at time of visit.  CSW provided pt with contact # to call CSW if questions or needs arise in regards to the above.  CSW will advise [facility] RNCM of above as well and ask for follow up with CSW if needs or concerns arise regarding the application, process or other.   Reece Levy, MSW, LCSW Clinical Social Worker  [facility] [phone]

## 2020-10-15 DIAGNOSIS — M5416 Radiculopathy, lumbar region: Secondary | ICD-10-CM | POA: Diagnosis not present

## 2020-10-15 DIAGNOSIS — M9983 Other biomechanical lesions of lumbar region: Secondary | ICD-10-CM | POA: Diagnosis not present

## 2020-11-12 ENCOUNTER — Other Ambulatory Visit: Payer: Self-pay | Admitting: *Deleted

## 2020-11-12 NOTE — Patient Outreach (Signed)
[facility]) Care Management  11/12/2020  WORLEY RADERMACHER 01/18/1966 [id number]  Outreach attempt to patient. No answer and unable to leave voicemail message due to phone rang multiple times with no answer.   Plan: RN Health Coach will call patient within the month of April.  [name] RN, BSN [facility] Care Management  RN Health Coach [phone] [email or web address]

## 2020-11-24 ENCOUNTER — Other Ambulatory Visit: Payer: Self-pay | Admitting: *Deleted

## 2020-11-24 NOTE — Patient Outreach (Signed)
[facility]) Care Management  [facility] Care Manager  11/24/2020   [name] September 18, 1965 [id number]  Subjective: Successful telephone outreach call to patient. HIPAA identifiers obtained. Patient reports that he has decided not receive PCS at this time. He explains that his COPD is at baseline and that the weather changes have not effected his breathing. Patient shared that he does continue to smoke. He did receive the smoking cessation education this nurse sent, he is thinking about quitting, and he is trying to smoke less. Patient states he has been unable to gain weight. He cannot tolerate Ensure due to stomach upset. He is trying to eat a large meal daily. Patient shares that he does not want to eat when he is drinking alcohol and that he does not want to stop drinking at this time. Nurse provided weight gain education and gave patient emotional support. Patient did state that he does drink water throughout the day to prevent dehydration. Patient did not have any further questions or concerns today and did confirm that he has this nurse's contact number to call her if needed.   Encounter Medications:  Outpatient Encounter Medications as of 11/24/2020  Medication Sig Note  . albuterol (PROVENTIL HFA;VENTOLIN HFA) 108 (90 Base) MCG/ACT inhaler Inhale 2 puffs into the lungs every 4 (four) hours as needed for wheezing or shortness of breath.   . Aspirin-Caffeine (BC FAST PAIN RELIEF PO) Take 1 packet by mouth every 6 (six) hours as needed (for headaches).   . gabapentin (NEURONTIN) 300 MG capsule Take 300 mg by mouth 3 (three) times daily.    . meloxicam (MOBIC) 7.5 MG tablet Take 7.5 mg by mouth daily. (Patient not taking: Reported on 08/19/2020) 08/19/2020: completed  . pantoprazole (PROTONIX) 40 MG tablet Take 1 tablet (40 mg total) by mouth daily.   Marland Kitchen PHENobarbital (LUMINAL) 32.4 MG tablet Take 1 tablet (32.4 mg total) by mouth 3 (three) times daily for 2 days, THEN 0.5 tablets  (16.2 mg total) 3 (three) times daily for 2 days.   Marland Kitchen thiamine 100 MG tablet Take 1 tablet (100 mg total) by mouth daily.   Marland Kitchen tiZANidine (ZANAFLEX) 2 MG tablet Take 1-2 tablets by mouth every 8 (eight) hours as needed for muscle spasms.     No facility-administered encounter medications on file as of 11/24/2020.    Functional Status:  In your present state of health, do you have any difficulty performing the following activities: 11/25/2019  Hearing? N  Vision? N  Difficulty concentrating or making decisions? N  Walking or climbing stairs? N  Dressing or bathing? N  Doing errands, shopping? Y  Comment [employer] and eating ? N  Using the Toilet? N  In the past six months, have you accidently leaked urine? N  Do you have problems with loss of bowel control? N  Managing your Medications? N  Managing your Finances? N  Housekeeping or managing your Housekeeping? N  Some recent data might be hidden    Fall/Depression Screening: Fall Risk  11/25/2019  Falls in the past year? 0   PHQ 2/9 Scores 03/24/2020 11/25/2019 03/18/2019 11/17/2015  PHQ - 2 Score 0 0 0 0    Assessment:  Goals Addressed            This Visit's Progress   . [facility]) Patient will not go to the ED or be admitted to the hospital for COPD exacerbation within the next 90 days  Timeframe:  Long-Range Goal Priority:  High Start Date:  08/19/20                           Expected End Date:  08/19/21                  Follow Up Date 03/19/21    - arrange in-home help services - develop a rescue plan - eliminate symptom triggers at home - follow rescue plan if symptoms flare-up - keep follow-up appointments - use an extra pillow to sleep  -Encouraged patient to quit smoking -Encouraged patient to continue to walk routinely to help  maintain his lung health and to maintain his strength and mobility  Why is this important?    Tracking your symptoms and other information about your health helps your  doctor plan your care.   Write down the symptoms, the time of day, what you were doing and what medicine you are taking.   You will soon learn how to manage your symptoms.     Notes: Patient states he does want to quit smoking. Nurse will send patient 1-800-quit-now resource.   Update 11/24/20: Patient states he received information 1-800 quit -now. Patient explains that he working on preparing to quit in his mind. He will start to smoke less. Patient states his COPD is controlled at this time.    Marland Kitchen [facility]) Patient will verbalize gaining 2-5 pounds within the next 90 days       Timeframe:  Long-Range Goal Priority:  High Start Date:  08/19/20                           Expected End Date:  08/19/21 Follow-up date: 03/19/21                     -Discussed eating high calorie foods frequently - Encouraged patient weighing himself 2-3 times weekly to be able to measure his success or failure. - Encouraged patient to try to eat at least one large meal daily. -Discussed drinking water throughout the day to stay hydrated  Notes: Patient reports that he does want to gain weight for his overall health and his back surgeon stated he would need to gain weight before he can have back surgery.  Patient discussed his appetite decreases when he is drinking alcohol or "my beer". Patient admits that he is an alcoholic and states he does not want to quit drinking at this time. Patient explains that he has cut back on the amount of beer he drinks daily to 3-4, 45 ounce bottled beer. Nurse will send Ensure coupons.  Updated 11/24/20: Patient reports that he received the Ensure coupons but is unable to drink them due stomach upset. He reports trying to eat one meal a day and states he does drink water throughout the day to stay hydrated. Patient explains he is not ready to quit drinking and he is trying to decrease the amount her drinks daily.     . COMPLETED: Patient will not go to the ED or be admitted to the  hospital for COPD exacerbation within the next 90 days       CARE PLAN ENTRY (see longtitudinal plan of care for additional care plan information)  Current Barriers:  Marland Kitchen Knowledge deficits related to basic understanding of COPD disease process . Knowledge deficits related to basic COPD self care/management   Case Manager Clinical  Goal(s):  Over the next 90 days, patient will be able to verbalize understanding of COPD action plan and when to seek appropriate levels of medical care  Over the next 90 days, patient will verbalize basic understanding of COPD disease process and self care activities  Over the next 90 days, patient will not be hospitalized for COPD exacerbation   Interventions:   Provided patient with basic written and verbal COPD education on self care/management/and exacerbation prevention   Encouraged patient to quit smoking and will send patient 1-800-quit-now resource.  Encouraged patient to continue to walk routinely to help improve lung expansion.  Patient Self Care Activities:  . Takes medications as prescribed including inhalers . Self assesses COPD action plan zone and makes appointment with provider if in the yellow zone for 48 hours without improvement. .  Patient states he wants to quit smoking and would like for nurse to send him information for resource 1-800-quit-now. . Patient reports walking routinely and most days.    Please see past updates related to this goal by clicking on the "Past Updates" button in the selected goal  Updated: 08/19/21 Follow-up: 11/17/20 Timeframe:  Long-Range Goal Priority:  High Start Date: 03/24/20                            Expected End Date: 02/17/21                      Resolved due to duplicate goals.      Plan: RN Health Coach will send PCP a quarterly report and will call patient within the month of June. Follow-up:  Patient agrees to Care Plan and Follow-up.  [name] RN, BSN [facility] Care Management  RN Health  Coach [phone] [email or web address]

## 2020-11-24 NOTE — Patient Instructions (Addendum)
Goals Addressed            This Visit's Progress   . [facility]) Patient will not go to the ED or be admitted to the hospital for COPD exacerbation within the next 90 days       Timeframe:  Long-Range Goal Priority:  High Start Date:  08/19/20                           Expected End Date:  08/19/21                  Follow Up Date 03/19/21    - arrange in-home help services - develop a rescue plan - eliminate symptom triggers at home - follow rescue plan if symptoms flare-up - keep follow-up appointments - use an extra pillow to sleep  -Encouraged patient to quit smoking -Encouraged patient to continue to walk routinely to help  maintain his lung health and to maintain his strength and mobility  Why is this important?    Tracking your symptoms and other information about your health helps your doctor plan your care.   Write down the symptoms, the time of day, what you were doing and what medicine you are taking.   You will soon learn how to manage your symptoms.     Notes: Patient states he does want to quit smoking. Nurse will send patient 1-800-quit-now resource.   Update 11/24/20: Patient states he received information 1-800 quit -now. Patient explains that he working on preparing to quit in his mind. He will start to smoke less. Patient states his COPD is controlled at this time.    [name] [facility]) Patient will verbalize gaining 2-5 pounds within the next 90 days       Timeframe:  Long-Range Goal Priority:  High Start Date:  08/19/20                           Expected End Date:  08/19/21 Follow-up date: 03/19/21                     -Discussed eating high calorie foods frequently - Encouraged patient weighing himself 2-3 times weekly to be able to measure his success or failure. - Encouraged patient to try to eat at least one large meal daily. -Discussed drinking water throughout the day to stay hydrated  Notes: Patient reports that he does want to gain weight for his overall health and  his back surgeon stated he would need to gain weight before he can have back surgery.  Patient discussed his appetite decreases when he is drinking alcohol or "my beer". Patient admits that he is an alcoholic and states he does not want to quit drinking at this time. Patient explains that he has cut back on the amount of beer he drinks daily to 3-4, 45 ounce bottled beer. Nurse will send Ensure coupons.  Updated 11/24/20: Patient reports that he received the Ensure coupons but is unable to drink them due stomach upset. He reports trying to eat one meal a day and states he does drink water throughout the day to stay hydrated. Patient explains he is not ready to quit drinking and he is trying to decrease the amount her drinks daily.     . COMPLETED: Patient will not go to the ED or be admitted to the hospital for COPD exacerbation within the next 90 days  CARE PLAN ENTRY (see longtitudinal plan of care for additional care plan information)  Current Barriers:  [name] Knowledge deficits related to basic understanding of COPD disease process . Knowledge deficits related to basic COPD self care/management   Case Manager Clinical Goal(s):  Over the next 90 days, patient will be able to verbalize understanding of COPD action plan and when to seek appropriate levels of medical care  Over the next 90 days, patient will verbalize basic understanding of COPD disease process and self care activities  Over the next 90 days, patient will not be hospitalized for COPD exacerbation   Interventions:   Provided patient with basic written and verbal COPD education on self care/management/and exacerbation prevention   Encouraged patient to quit smoking and will send patient 1-800-quit-now resource.  Encouraged patient to continue to walk routinely to help improve lung expansion.  Patient Self Care Activities:  . Takes medications as prescribed including inhalers . Self assesses COPD action plan zone and makes  appointment with provider if in the yellow zone for 48 hours without improvement. .  Patient states he wants to quit smoking and would like for nurse to send him information for resource 1-800-quit-now. . Patient reports walking routinely and most days.    Please see past updates related to this goal by clicking on the "Past Updates" button in the selected goal  Updated: 08/19/21 Follow-up: 11/17/20 Timeframe:  Long-Range Goal Priority:  High Start Date: 03/24/20                            Expected End Date: 02/17/21                      Resolved due to duplicate goals.

## 2020-12-16 DIAGNOSIS — M5416 Radiculopathy, lumbar region: Secondary | ICD-10-CM | POA: Diagnosis not present

## 2021-01-07 DIAGNOSIS — M9983 Other biomechanical lesions of lumbar region: Secondary | ICD-10-CM | POA: Diagnosis not present

## 2021-01-07 DIAGNOSIS — M5416 Radiculopathy, lumbar region: Secondary | ICD-10-CM | POA: Diagnosis not present

## 2021-01-07 DIAGNOSIS — M4316 Spondylolisthesis, lumbar region: Secondary | ICD-10-CM | POA: Diagnosis not present

## 2021-01-12 ENCOUNTER — Other Ambulatory Visit: Payer: Self-pay | Admitting: Hand Surgery

## 2021-01-12 DIAGNOSIS — M5416 Radiculopathy, lumbar region: Secondary | ICD-10-CM

## 2021-01-31 ENCOUNTER — Ambulatory Visit
Admission: RE | Admit: 2021-01-31 | Discharge: 2021-01-31 | Disposition: A | Discharge status: Home / Self Care | Payer: Medicare Other | Source: Ambulatory Visit | Attending: Neurosurgery | Admitting: Neurosurgery

## 2021-01-31 DIAGNOSIS — M5416 Radiculopathy, lumbar region: Secondary | ICD-10-CM

## 2021-02-07 ENCOUNTER — Other Ambulatory Visit: Payer: Self-pay | Admitting: *Deleted

## 2021-02-07 NOTE — Patient Outreach (Addendum)
[facility]) Care Management  [facility] Care Manager  02/07/2021   [name] [birth year] [id number]  Subjective: Successful telephone outreach call to patient. HIPAA identifiers obtained. Patient states he currently smokes 10 cigarettes daily and reports he used to smoke 2 packs a day. He explains that he is not ready to quit smoking at this time. Patient states that he continues to use his rescue inhaler before he goes to bed which helps him to be able to lay flat and go to sleep. He denies that the hotter weather is effecting his breathing and recently got his inhaler prescription filled. He feels that his COPD is at baseline at this time. Patient explains that he does want to try to gain weight to have back surgery and for his overall health. Patient states he will start to try to drink 3 instead of 4 40 ounce beers daily. He will try to eat 1 large meal daily and drink at least 1 Ensure daily. Nurse will send patient Ensure coupons. Patient reports that he does feel like he has been able to eat more lately, he has been successful with eating 1 large meal everyday, and he does feel like he has gain a few pounds. Nurse congratulated him on his success.Patient did not have any further questions or concerns today and did confirm that he has this nurse's contact number to call her if needed.   Encounter Medications:  Outpatient Encounter Medications as of 02/07/2021  Medication Sig Note   albuterol (PROVENTIL HFA;VENTOLIN HFA) 108 (90 Base) MCG/ACT inhaler Inhale 2 puffs into the lungs every 4 (four) hours as needed for wheezing or shortness of breath.    Aspirin-Caffeine (BC FAST PAIN RELIEF PO) Take 1 packet by mouth every 6 (six) hours as needed (for headaches).    gabapentin (NEURONTIN) 300 MG capsule Take 300 mg by mouth 3 (three) times daily.     meloxicam (MOBIC) 7.5 MG tablet Take 7.5 mg by mouth daily. 08/19/2020: completed   thiamine 100 MG tablet Take 1 tablet (100 mg  total) by mouth daily.    tiZANidine (ZANAFLEX) 2 MG tablet Take 1-2 tablets by mouth every 8 (eight) hours as needed for muscle spasms.     pantoprazole (PROTONIX) 40 MG tablet Take 1 tablet (40 mg total) by mouth daily.    PHENobarbital (LUMINAL) 32.4 MG tablet Take 1 tablet (32.4 mg total) by mouth 3 (three) times daily for 2 days, THEN 0.5 tablets (16.2 mg total) 3 (three) times daily for 2 days.    No facility-administered encounter medications on file as of 02/07/2021.    Functional Status:  No flowsheet data found.  Fall/Depression Screening: Fall Risk  11/25/2019  Falls in the past year? 0   PHQ 2/9 Scores 03/24/2020 11/25/2019 03/18/2019 11/17/2015  PHQ - 2 Score 0 0 0 0    Assessment:   Care Plan There are no care plans that you recently modified to display for this patient.    Goals Addressed             This Visit's Progress    [facility]) Patient will not go to the ED or be admitted to the hospital for COPD exacerbation within the next 90 days   On track    Timeframe:  Long-Range Goal Priority:  High Start Date:  08/19/20                           Expected  End Date:  08/19/21                  Follow Up Date 05/20/21    - arrange in-home help services - develop a rescue plan - eliminate symptom triggers at home - follow rescue plan if symptoms flare-up - keep follow-up appointments - use an extra pillow to sleep  -Encouraged patient to cut down the amount of cigarettes he smokes daily  -Encouraged patient to continue to walk routinely to help  maintain his lung health and to maintain his strength and mobility  Why is this important?   Tracking your symptoms and other information about your health helps your doctor plan your care.  Write down the symptoms, the time of day, what you were doing and what medicine you are taking.  You will soon learn how to manage your symptoms.     Notes: Patient states he does want to quit smoking. Nurse will send patient 1-800-quit-now  resource.   Update 11/24/20: Patient states he received information 1-800 quit -now. Patient explains that he working on preparing to quit in his mind. He will start to smoke less. Patient states his COPD is controlled at this time.  Updated 02/07/21: Patient states he currently smokes 10 cigarettes daily and reports he used to smoke 2 packs a day. He explains that he is not ready to quit smoking at this time. Patient states that he continues to use his rescue inhaler before he goes to bed which helps him to be able to lay flat and go to sleep. He denies that the hotter weather is effecting his breathing and recently got his inhaler prescription filled. Nurse congratulated patient for decreasing the amount of cigarettes he smokes.       [facility]) Patient will verbalize gaining 2-5 pounds within the next 90 days   On track    Timeframe:  Long-Range Goal Priority:  High Start Date:  08/19/20                           Expected End Date:  08/19/21 Follow-up date: 05/20/21                     -Discussed eating high calorie foods frequently - Encouraged patient to weigh himself 2-3 times weekly to be able to measure his success or failure. - Encouraged patient to try to eat at least one large meal daily. -Discussed drinking water throughout the day to stay hydrated -Discussed trying to drink at least 1 Ensure per day  Notes: Patient reports that he does want to gain weight for his overall health and his back surgeon stated he would need to gain weight before he can have back surgery.  Patient discussed his appetite decreases when he is drinking alcohol or "my beer". Patient admits that he is an alcoholic and states he does not want to quit drinking at this time. Patient explains that he has cut back on the amount of beer he drinks daily to 3-4, 45 ounce bottled beer. Nurse will send Ensure coupons.  Updated 11/24/20: Patient reports that he received the Ensure coupons but is unable to drink them due stomach  upset. He reports trying to eat one meal a day and states he does drink water throughout the day to stay hydrated. Patient explains he is not ready to quit drinking and he is trying to decrease the amount her drinks daily.   Updated 02/07/21:  Patient explains that he does want to try to gain weight to have back surgery and  for his overall health. Patient states he will start to try to drink 3 instead of 4 40 ounce beers daily. He will try to eat 1 large meal daily and drink at least 1 Ensure daily. Nurse will send patient Ensure coupons. Patient reports that he does feel like he has been able to eat more lately and has been successful with eating 1 large meal everyday and he does feel like he has gain a few pounds. Nurse congratulated him on his success.         Plan: RN Health Coach will send PCP a quarterly update, will send patient Ensure coupons, and will call patient within the month of  September. Follow-up: Patient agrees to Care Plan and Follow-up.  [name] RN, BSN [facility] Care Management  RN Health Coach [phone] [email or web address]

## 2021-02-07 NOTE — Patient Instructions (Signed)
Goals Addressed             This Visit's Progress    [facility]) Patient will not go to the ED or be admitted to the hospital for COPD exacerbation within the next 90 days   On track    Timeframe:  Long-Range Goal Priority:  High Start Date:  08/19/20                           Expected End Date:  08/19/21                  Follow Up Date 05/20/21    - arrange in-home help services - develop a rescue plan - eliminate symptom triggers at home - follow rescue plan if symptoms flare-up - keep follow-up appointments - use an extra pillow to sleep  -Encouraged patient to cut down the amount of cigarettes he smokes daily  -Encouraged patient to continue to walk routinely to help  maintain his lung health and to maintain his strength and mobility  Why is this important?   Tracking your symptoms and other information about your health helps your doctor plan your care.  Write down the symptoms, the time of day, what you were doing and what medicine you are taking.  You will soon learn how to manage your symptoms.     Notes: Patient states he does want to quit smoking. Nurse will send patient 1-800-quit-now resource.   Update 11/24/20: Patient states he received information 1-800 quit -now. Patient explains that he working on preparing to quit in his mind. He will start to smoke less. Patient states his COPD is controlled at this time.  Updated 02/07/21: Patient states he currently smokes 10 cigarettes daily and reports he used to smoke 2 packs a day. He explains that he is not ready to quit smoking at this time. Patient states that he continues to use his rescue inhaler before he goes to bed which helps him to be able to lay flat and go to sleep. He denies that the hotter weather is effecting his breathing and recently got his inhaler prescription filled. Nurse congratulated patient for decreasing the amount of cigarettes he smokes.       [facility]) Patient will verbalize gaining 2-5 pounds within the  next 90 days   On track    Timeframe:  Long-Range Goal Priority:  High Start Date:  08/19/20                           Expected End Date:  08/19/21 Follow-up date: 05/20/21                     -Discussed eating high calorie foods frequently - Encouraged patient to weigh himself 2-3 times weekly to be able to measure his success or failure. - Encouraged patient to try to eat at least one large meal daily. -Discussed drinking water throughout the day to stay hydrated -Discussed trying to drink at least 1 Ensure per day  Notes: Patient reports that he does want to gain weight for his overall health and his back surgeon stated he would need to gain weight before he can have back surgery.  Patient discussed his appetite decreases when he is drinking alcohol or "my beer". Patient admits that he is an alcoholic and states he does not want to quit drinking at this time. Patient explains that he has  cut back on the amount of beer he drinks daily to 3-4, 45 ounce bottled beer. Nurse will send Ensure coupons.  Updated 11/24/20: Patient reports that he received the Ensure coupons but is unable to drink them due stomach upset. He reports trying to eat one meal a day and states he does drink water throughout the day to stay hydrated. Patient explains he is not ready to quit drinking and he is trying to decrease the amount her drinks daily.   Updated 02/07/21: Patient explains that he does want to try to gain weight to have back surgery and  for his overall health. Patient states he will start to try to drink 3 instead of 4 40 ounce beers daily. He will try to eat 1 large meal daily and drink at least 1 Ensure daily. Nurse will send patient Ensure coupons. Patient reports that he does feel like he has been able to eat more lately and has been successful with eating 1 large meal everyday and he does feel like he has gain a few pounds. Nurse congratulated him on his success.

## 2021-03-29 DIAGNOSIS — M5416 Radiculopathy, lumbar region: Secondary | ICD-10-CM | POA: Diagnosis not present

## 2021-05-09 ENCOUNTER — Other Ambulatory Visit: Payer: Self-pay | Admitting: *Deleted

## 2021-05-09 NOTE — Patient Outreach (Signed)
[facility]) Care Management  05/09/2021  MYRTLE BARNHARD 1966/01/15 [id number]  Outreach attempt to patient. Unable to leave voicemail message due to called 2 times, each time the phone picked up and someone said hello, nurse asked to speak to the patient and the phone then hung up.  Plan: RN Health Coach will call patient within the month of October.  [name] RN, BSN [facility] Care Management  RN Health Coach [phone] [email or web address]

## 2021-05-31 DIAGNOSIS — E559 Vitamin D deficiency, unspecified: Secondary | ICD-10-CM | POA: Diagnosis not present

## 2021-05-31 DIAGNOSIS — E538 Deficiency of other specified B group vitamins: Secondary | ICD-10-CM | POA: Diagnosis not present

## 2021-05-31 DIAGNOSIS — B351 Tinea unguium: Secondary | ICD-10-CM | POA: Diagnosis not present

## 2021-05-31 DIAGNOSIS — J449 Chronic obstructive pulmonary disease, unspecified: Secondary | ICD-10-CM | POA: Diagnosis not present

## 2021-05-31 DIAGNOSIS — Z23 Encounter for immunization: Secondary | ICD-10-CM | POA: Diagnosis not present

## 2021-05-31 DIAGNOSIS — Z720 Tobacco use: Secondary | ICD-10-CM | POA: Diagnosis not present

## 2021-05-31 DIAGNOSIS — Z0000 Encounter for general adult medical examination without abnormal findings: Secondary | ICD-10-CM | POA: Diagnosis not present

## 2021-05-31 DIAGNOSIS — E782 Mixed hyperlipidemia: Secondary | ICD-10-CM | POA: Diagnosis not present

## 2021-06-07 ENCOUNTER — Ambulatory Visit (INDEPENDENT_AMBULATORY_CARE_PROVIDER_SITE_OTHER): Payer: Medicare Other | Admitting: Podiatry

## 2021-06-07 ENCOUNTER — Other Ambulatory Visit: Payer: Self-pay

## 2021-06-07 ENCOUNTER — Encounter: Payer: Self-pay | Admitting: Podiatry

## 2021-06-07 ENCOUNTER — Ambulatory Visit (INDEPENDENT_AMBULATORY_CARE_PROVIDER_SITE_OTHER): Payer: Medicare Other

## 2021-06-07 DIAGNOSIS — E782 Mixed hyperlipidemia: Secondary | ICD-10-CM | POA: Insufficient documentation

## 2021-06-07 DIAGNOSIS — R0602 Shortness of breath: Secondary | ICD-10-CM | POA: Insufficient documentation

## 2021-06-07 DIAGNOSIS — D539 Nutritional anemia, unspecified: Secondary | ICD-10-CM | POA: Insufficient documentation

## 2021-06-07 DIAGNOSIS — S90211A Contusion of right great toe with damage to nail, initial encounter: Secondary | ICD-10-CM | POA: Diagnosis not present

## 2021-06-07 DIAGNOSIS — E559 Vitamin D deficiency, unspecified: Secondary | ICD-10-CM | POA: Insufficient documentation

## 2021-06-07 DIAGNOSIS — E539 Vitamin B deficiency, unspecified: Secondary | ICD-10-CM | POA: Insufficient documentation

## 2021-06-07 DIAGNOSIS — R7401 Elevation of levels of liver transaminase levels: Secondary | ICD-10-CM | POA: Insufficient documentation

## 2021-06-07 DIAGNOSIS — G621 Alcoholic polyneuropathy: Secondary | ICD-10-CM | POA: Insufficient documentation

## 2021-06-07 DIAGNOSIS — G629 Polyneuropathy, unspecified: Secondary | ICD-10-CM | POA: Insufficient documentation

## 2021-06-07 DIAGNOSIS — K439 Ventral hernia without obstruction or gangrene: Secondary | ICD-10-CM | POA: Insufficient documentation

## 2021-06-07 NOTE — Patient Instructions (Signed)

## 2021-06-08 NOTE — Progress Notes (Signed)
  Subjective:  Patient ID: [name], male    DOB: 08-20-1966,  MRN: [id number]  Chief Complaint  Patient presents with   Nail Problem     (np) Right great toe fungal infection    55 y.o. male presents with the above complaint. History confirmed with patient.  Right hallux nail discolored is not sure why it does not recall any specific traumatic event  Objective:  Physical Exam: warm, good capillary refill, no trophic changes or ulcerative lesions, normal DP and PT pulses, and normal sensory exam. Left Foot: normal exam, no swelling, tenderness, instability; ligaments intact, full range of motion of all ankle/foot joints Right Foot: Subungual well-organized hematoma involving 100% of nail body  No images are attached to the encounter.  Radiographs: Multiple views x-ray of the right foot: No fracture noted Assessment:   1. Contusion of right great toe with damage to nail, initial encounter      Plan:  Patient was evaluated and treated and all questions answered.  Appears to have suffered some sort of contusion at some point.  There is no fracture on his x-rays.  The nail was loose and had a dense subungual hematoma that is lifting the nail plate up.  I recommended an temporary nail avulsion which I performed after sterile prep with Betadine and digital block.  He tolerated this well.  Advised that the nail will regrow in the next 3 to 6 months, if he has issues with this he will return to see me as needed  Return if symptoms worsen or fail to improve.

## 2021-06-14 ENCOUNTER — Other Ambulatory Visit: Payer: Self-pay | Admitting: *Deleted

## 2021-06-14 ENCOUNTER — Encounter: Payer: Self-pay | Admitting: *Deleted

## 2021-06-14 NOTE — Patient Outreach (Signed)
[facility]) Care Management  [facility] Care Manager  06/14/2021   [name] 1966-03-03 [id number]  Subjective: Successful telephone outreach call to patient. HIPAA identifiers obtained. Patient states he is dong well at this time. Nurse discussed with patient his health goals and his health and wellness needs which were documented in the Epic system. Patient did not have any further questions or concerns today and did confirm that he has this nurse's contact number to call her if needed.   Encounter Medications:  Outpatient Encounter Medications as of 06/14/2021  Medication Sig Note   albuterol (PROVENTIL HFA;VENTOLIN HFA) 108 (90 Base) MCG/ACT inhaler Inhale 2 puffs into the lungs every 4 (four) hours as needed for wheezing or shortness of breath.    Aspirin-Caffeine (BC FAST PAIN RELIEF PO) Take 1 packet by mouth every 6 (six) hours as needed (for headaches).    folic acid (FOLVITE) 1 MG tablet 1 tab(s)    gabapentin (NEURONTIN) 300 MG capsule 1 cap(s)    meloxicam (MOBIC) 7.5 MG tablet 1-2 tab(s)    SYMBICORT 160-4.5 MCG/ACT inhaler SMARTSIG:2 Puff(s) By Mouth Twice Daily    thiamine 100 MG tablet Take 1 tablet (100 mg total) by mouth daily.    tiZANidine (ZANAFLEX) 2 MG tablet Take by mouth.    Vitamin D3 (VITAMIN D) 25 MCG tablet 1 tab(s)    gabapentin (NEURONTIN) 300 MG capsule Take 300 mg by mouth 3 (three) times daily.  (Patient not taking: Reported on 06/14/2021) 06/14/2021: duplicate   meloxicam (MOBIC) 7.5 MG tablet Take 7.5 mg by mouth daily. (Patient not taking: Reported on 06/14/2021) 06/14/2021: completed   pantoprazole (PROTONIX) 40 MG tablet Take 1 tablet (40 mg total) by mouth daily.    PHENobarbital (LUMINAL) 32.4 MG tablet Take 1 tablet (32.4 mg total) by mouth 3 (three) times daily for 2 days, THEN 0.5 tablets (16.2 mg total) 3 (three) times daily for 2 days.    tiZANidine (ZANAFLEX) 2 MG tablet Take 1-2 tablets by mouth every 8 (eight) hours as  needed for muscle spasms.  (Patient not taking: Reported on 06/14/2021) 06/14/2021: duplicate   No facility-administered encounter medications on file as of 06/14/2021.    Functional Status:  No flowsheet data found.  Fall/Depression Screening: Fall Risk  06/14/2021 11/25/2019  Falls in the past year? 0 0  Number falls in past yr: 0 -  Injury with Fall? 0 -  Follow up Falls evaluation completed -   PHQ 2/9 Scores 06/14/2021 03/24/2020 11/25/2019 03/18/2019 11/17/2015  PHQ - 2 Score 0 0 0 0 0    Assessment:   Care Plan There are no care plans that you recently modified to display for this patient.    Goals Addressed             This Visit's Progress    [facility]) Patient will not go to the ED or be admitted to the hospital for COPD exacerbation within the next 90 days   On track    Timeframe:  Long-Range Goal Priority:  High Start Date:  08/19/20                           Expected End Date:  02/17/22                  Follow Up Date 09/19/21    - develop a rescue plan - eliminate symptom triggers at home - follow rescue plan if symptoms flare-up -  keep follow-up appointments - use an extra pillow to sleep  -Encouraged patient to cut down the amount of cigarettes he smokes daily  -Encouraged patient to continue to walk routinely to help  maintain his lung health and to maintain his strength and mobility  Why is this important?   Tracking your symptoms and other information about your health helps your doctor plan your care.  Write down the symptoms, the time of day, what you were doing and what medicine you are taking.  You will soon learn how to manage your symptoms.     Notes: 08/19/20: Patient states he does want to quit smoking. Nurse will send patient 1-800-quit-now resource.   Update 11/24/20: Patient states he received information 1-800 quit -now. Patient explains that he working on preparing to quit in his mind. He will start to smoke less. Patient states his COPD is  controlled at this time.  Updated 02/07/21: Patient states he currently smokes 10 cigarettes daily and reports he used to smoke 2 packs a day. He explains that he is not ready to quit smoking at this time. Patient states that he continues to use his rescue inhaler before he goes to bed which helps him to be able to lay flat and go to sleep. He denies that the hotter weather is effecting his breathing and recently got his inhaler prescription filled. Nurse congratulated patient for decreasing the amount of cigarettes he smokes.   Updated 06/14/21:  Patient recently had PCP  visit 05/31/21. Explains that his prescribed control inhaler and rescue inhaler are working well at this time to keep his COPD under control. Patient states he continues to smoke about 10 cigarettes per day and reports he is not ready to quit. Nurse will send patient COPD education.      [facility]) Patient will verbalize gaining 2-5 pounds within the next 90 days       Timeframe:  Long-Range Goal Priority:  High Start Date:  08/19/20                           Expected End Date:  02/17/22 Follow-up date: 09/19/21                     -Discussed eating high calorie foods frequently - Encouraged patient to weigh himself 2-3 times weekly to be able to measure his success or failure. - Encouraged patient to try to eat at least one large meal daily. -Discussed drinking at least 3 glasses of water daily to stay hydrated -Discussed trying to eat small amounts throughout the day  Notes: 08/19/20: Patient reports that he does want to gain weight for his overall health and his back surgeon stated he would need to gain weight before he can have back surgery.  Patient discussed his appetite decreases when he is drinking alcohol or "my beer". Patient admits that he is an alcoholic and states he does not want to quit drinking at this time. Patient explains that he has cut back on the amount of beer he drinks daily to 3-4, 45 ounce bottled beer. Nurse  will send Ensure coupons.  Updated 11/24/20: Patient reports that he received the Ensure coupons but is unable to drink them due stomach upset. He reports trying to eat one meal a day and states he does drink water throughout the day to stay hydrated. Patient explains he is not ready to quit drinking and he is trying to decrease the  amount her drinks daily.   Updated 02/07/21: Patient explains that he does want to try to gain weight to have back surgery and for his overall health. Patient states he will start to try to drink 3 instead of 4 40 ounce beers daily. He will try to eat 1 large meal daily and drink at least 1 Ensure daily. Nurse will send patient Ensure coupons. Patient reports that he does feel like he has been able to eat more lately and has been successful with eating 1 large meal everyday and he does feel like he has gain a few pounds. Nurse congratulated him on his success.   Updated 06/14/21: Patient states that he cannot tolerate drinking dietary supplements like Ensure. He is eating one large meal per day and continues to try to decrease the amount of beer he drinks. Patient states he does not want to quit drinking. Nurse will send patient high calorie and high protein diet education.        Plan: RN Health Coach will send PCP today's assessment note, will send patient COPD and high calorie/high protein diet education, and will  call patient within the month of January.  Follow-up: Patient agrees to Care Plan and Follow-up.  [name] RN, BSN [facility] Care Management  RN Health Coach [phone] [email or web address]

## 2021-06-14 NOTE — Patient Instructions (Addendum)
Goals Addressed             This Visit's Progress    [facility]) Patient will not go to the ED or be admitted to the hospital for COPD exacerbation within the next 90 days   On track    Timeframe:  Long-Range Goal Priority:  High Start Date:  08/19/20                           Expected End Date:  02/17/22                  Follow Up Date 09/19/21    - develop a rescue plan - eliminate symptom triggers at home - follow rescue plan if symptoms flare-up - keep follow-up appointments - use an extra pillow to sleep  -Encouraged patient to cut down the amount of cigarettes he smokes daily  -Encouraged patient to continue to walk routinely to help  maintain his lung health and to maintain his strength and mobility  Why is this important?   Tracking your symptoms and other information about your health helps your doctor plan your care.  Write down the symptoms, the time of day, what you were doing and what medicine you are taking.  You will soon learn how to manage your symptoms.     Notes: 08/19/20: Patient states he does want to quit smoking. Nurse will send patient 1-800-quit-now resource.   Update 11/24/20: Patient states he received information 1-800 quit -now. Patient explains that he working on preparing to quit in his mind. He will start to smoke less. Patient states his COPD is controlled at this time.  Updated 02/07/21: Patient states he currently smokes 10 cigarettes daily and reports he used to smoke 2 packs a day. He explains that he is not ready to quit smoking at this time. Patient states that he continues to use his rescue inhaler before he goes to bed which helps him to be able to lay flat and go to sleep. He denies that the hotter weather is effecting his breathing and recently got his inhaler prescription filled. Nurse congratulated patient for decreasing the amount of cigarettes he smokes.   Updated 06/14/21:  Patient recently had PCP  visit 05/31/21. Explains that his prescribed  control inhaler and rescue inhaler are working well at this time to keep his COPD under control. Patient states he continues to smoke about 10 cigarettes per day and reports he is not ready to quit. Nurse will send patient COPD education.      [facility]) Patient will verbalize gaining 2-5 pounds within the next 90 days       Timeframe:  Long-Range Goal Priority:  High Start Date:  08/19/20                           Expected End Date:  02/17/22 Follow-up date: 09/19/21                     -Discussed eating high calorie foods frequently - Encouraged patient to weigh himself 2-3 times weekly to be able to measure his success or failure. - Encouraged patient to try to eat at least one large meal daily. -Discussed drinking at least 3 glasses of water daily to stay hydrated -Discussed trying to eat small amounts throughout the day  Notes: 08/19/20: Patient reports that he does want to gain weight for his overall health  and his back surgeon stated he would need to gain weight before he can have back surgery.  Patient discussed his appetite decreases when he is drinking alcohol or "my beer". Patient admits that he is an alcoholic and states he does not want to quit drinking at this time. Patient explains that he has cut back on the amount of beer he drinks daily to 3-4, 45 ounce bottled beer. Nurse will send Ensure coupons.  Updated 11/24/20: Patient reports that he received the Ensure coupons but is unable to drink them due stomach upset. He reports trying to eat one meal a day and states he does drink water throughout the day to stay hydrated. Patient explains he is not ready to quit drinking and he is trying to decrease the amount her drinks daily.   Updated 02/07/21: Patient explains that he does want to try to gain weight to have back surgery and for his overall health. Patient states he will start to try to drink 3 instead of 4 40 ounce beers daily. He will try to eat 1 large meal daily and drink at least 1  Ensure daily. Nurse will send patient Ensure coupons. Patient reports that he does feel like he has been able to eat more lately and has been successful with eating 1 large meal everyday and he does feel like he has gain a few pounds. Nurse congratulated him on his success.   Updated 06/14/21: Patient states that he cannot tolerate drinking dietary supplements like Ensure. He is eating one large meal per day and continues to try to decrease the amount of beer he drinks. Patient states he does not want to quit drinking. Nurse will send patient high calorie and high protein diet education.

## 2021-06-16 ENCOUNTER — Ambulatory Visit: Payer: Self-pay | Admitting: *Deleted

## 2021-07-01 DIAGNOSIS — E538 Deficiency of other specified B group vitamins: Secondary | ICD-10-CM | POA: Diagnosis not present

## 2021-07-01 DIAGNOSIS — Z0000 Encounter for general adult medical examination without abnormal findings: Secondary | ICD-10-CM | POA: Diagnosis not present

## 2021-07-01 DIAGNOSIS — E782 Mixed hyperlipidemia: Secondary | ICD-10-CM | POA: Diagnosis not present

## 2021-07-01 DIAGNOSIS — E559 Vitamin D deficiency, unspecified: Secondary | ICD-10-CM | POA: Diagnosis not present

## 2021-07-01 DIAGNOSIS — Z720 Tobacco use: Secondary | ICD-10-CM | POA: Diagnosis not present

## 2021-07-01 DIAGNOSIS — J449 Chronic obstructive pulmonary disease, unspecified: Secondary | ICD-10-CM | POA: Diagnosis not present

## 2021-07-04 ENCOUNTER — Other Ambulatory Visit: Payer: Self-pay

## 2021-07-04 ENCOUNTER — Encounter (HOSPITAL_COMMUNITY): Payer: Self-pay | Admitting: Emergency Medicine

## 2021-07-04 ENCOUNTER — Emergency Department (HOSPITAL_COMMUNITY)
Admission: EM | Admit: 2021-07-04 | Discharge: 2021-07-05 | Disposition: A | Discharge status: Home / Self Care | Payer: Medicare Other | Attending: Emergency Medicine | Admitting: Emergency Medicine

## 2021-07-04 DIAGNOSIS — Z5321 Procedure and treatment not carried out due to patient leaving prior to being seen by health care provider: Secondary | ICD-10-CM | POA: Insufficient documentation

## 2021-07-04 DIAGNOSIS — R531 Weakness: Secondary | ICD-10-CM | POA: Diagnosis not present

## 2021-07-04 DIAGNOSIS — Z79899 Other long term (current) drug therapy: Secondary | ICD-10-CM | POA: Diagnosis not present

## 2021-07-04 DIAGNOSIS — R0602 Shortness of breath: Secondary | ICD-10-CM | POA: Insufficient documentation

## 2021-07-04 DIAGNOSIS — I499 Cardiac arrhythmia, unspecified: Secondary | ICD-10-CM | POA: Diagnosis not present

## 2021-07-04 DIAGNOSIS — J449 Chronic obstructive pulmonary disease, unspecified: Secondary | ICD-10-CM | POA: Insufficient documentation

## 2021-07-04 DIAGNOSIS — F10120 Alcohol abuse with intoxication, uncomplicated: Secondary | ICD-10-CM | POA: Diagnosis not present

## 2021-07-04 DIAGNOSIS — Z743 Need for continuous supervision: Secondary | ICD-10-CM | POA: Diagnosis not present

## 2021-07-04 DIAGNOSIS — I517 Cardiomegaly: Secondary | ICD-10-CM | POA: Diagnosis not present

## 2021-07-04 DIAGNOSIS — R1110 Vomiting, unspecified: Secondary | ICD-10-CM | POA: Diagnosis not present

## 2021-07-04 LAB — CBC
HCT: 35.6 % — ABNORMAL LOW (ref 39.0–52.0)
Hemoglobin: 13 g/dL (ref 13.0–17.0)
MCH: 36.8 pg — ABNORMAL HIGH (ref 26.0–34.0)
MCHC: 36.5 g/dL — ABNORMAL HIGH (ref 30.0–36.0)
MCV: 100.8 fL — ABNORMAL HIGH (ref 80.0–100.0)
Platelets: 146 10*3/uL — ABNORMAL LOW (ref 150–400)
RBC: 3.53 MIL/uL — ABNORMAL LOW (ref 4.22–5.81)
RDW: 15.2 % (ref 11.5–15.5)
WBC: 3 10*3/uL — ABNORMAL LOW (ref 4.0–10.5)
nRBC: 0 % (ref 0.0–0.2)

## 2021-07-04 LAB — COMPREHENSIVE METABOLIC PANEL
ALT: 89 U/L — ABNORMAL HIGH (ref 0–44)
AST: 159 U/L — ABNORMAL HIGH (ref 15–41)
Albumin: 3.9 g/dL (ref 3.5–5.0)
Alkaline Phosphatase: 74 U/L (ref 38–126)
Anion gap: 9 (ref 5–15)
BUN: <5 mg/dL — ABNORMAL LOW (ref 6–20)
CO2: 27 mmol/L (ref 22–32)
Calcium: 8.6 mg/dL — ABNORMAL LOW (ref 8.9–10.3)
Chloride: 100 mmol/L (ref 98–111)
Creatinine, Ser: 0.54 mg/dL — ABNORMAL LOW (ref 0.61–1.24)
GFR, Estimated: >60 mL/min (ref >60)
Glucose, Bld: 82 mg/dL (ref 70–99)
Potassium: 3.4 mmol/L — ABNORMAL LOW (ref 3.5–5.1)
Sodium: 136 mmol/L (ref 135–145)
Total Bilirubin: 0.3 mg/dL (ref 0.3–1.2)
Total Protein: 6.8 g/dL (ref 6.5–8.1)

## 2021-07-04 LAB — RAPID URINE DRUG SCREEN, HOSP PERFORMED
Amphetamines: NOT DETECTED
Barbiturates: NOT DETECTED
Benzodiazepines: POSITIVE — AB
Cocaine: NOT DETECTED
Opiates: NOT DETECTED
Tetrahydrocannabinol: POSITIVE — AB

## 2021-07-04 LAB — ETHANOL: Alcohol, Ethyl (B): 367 mg/dL (ref <10)

## 2021-07-04 NOTE — ED Provider Notes (Signed)
Emergency Medicine Provider Triage Evaluation Note  [name] , a 55 y.o. male  was evaluated in triage.  Pt complains of shortness of breath and weakness starting today. Patient reports drinking 4 forty oz beers per day.  Reports minimal food intake.  History of COPD, was seen at primary care on Friday and got several breathing treatments.  Review of Systems  Positive: Shortness of breath, weakness Negative: Chest pain, abdominal pain, nausea, vomiting  Physical Exam  BP 116/85   Pulse 95   Resp 16   SpO2 95%  Gen:   Awake, no distress   Resp:  Normal effort  MSK:   Moves extremities without difficulty  Other:  Patient intoxicated in triage, oriented x 4  Medical Decision Making  Medically screening exam initiated at 9:06 PM.  Appropriate orders placed.  [name] was informed that the remainder of the evaluation will be completed by another provider, this initial triage assessment does not replace that evaluation, and the importance of remaining in the ED until their evaluation is complete.     [name] 07/04/21 2107    Danny Files, MD 07/04/21 [phone]

## 2021-07-04 NOTE — ED Triage Notes (Signed)
Patient from home, here with shortness of breath and palpitations and generalized weakness for one day.  Patient has been drinking only beer.  Patient has not been eating or drinking at this point.

## 2021-07-05 NOTE — ED Notes (Signed)
X3 for vitals and registration wit no response

## 2021-08-03 DIAGNOSIS — J449 Chronic obstructive pulmonary disease, unspecified: Secondary | ICD-10-CM | POA: Diagnosis not present

## 2021-08-03 DIAGNOSIS — E538 Deficiency of other specified B group vitamins: Secondary | ICD-10-CM | POA: Diagnosis not present

## 2021-08-03 DIAGNOSIS — E782 Mixed hyperlipidemia: Secondary | ICD-10-CM | POA: Diagnosis not present

## 2021-08-03 DIAGNOSIS — J209 Acute bronchitis, unspecified: Secondary | ICD-10-CM | POA: Diagnosis not present

## 2021-08-03 DIAGNOSIS — E559 Vitamin D deficiency, unspecified: Secondary | ICD-10-CM | POA: Diagnosis not present

## 2021-09-14 ENCOUNTER — Other Ambulatory Visit: Payer: Self-pay | Admitting: *Deleted

## 2021-09-14 NOTE — Patient Outreach (Signed)
[facility]) Care Management  09/14/2021  [name] February 10, 1966 [id number]  [facility]) Care Management RN Health Coach Note   09/14/2021 Name:  [name] MRN:  [id number] DOB:  09-22-1965  Summary: Patient reports having a COPD exacerbation in November. Patient visited his providers office to be treated 07/01/21 and unfortunately went to the ED to be treated on 07/04/21 where he eloped before he was fully treated. Currently, the patient states his COPD is back to baseline. Patient continues to try to limit his cigarette smoking to 10 or less per day and his alcohol consumption to 3 forty once beers per day. Admitting that some days he smokes and drinks more. Patient states that he is not ready to quit smoking or drinking alcohol at this time. He is trying to increase the amount he is eating to gain weight. His goal is to eat one large meal per day, drink one Ensure per day, and snack frequently. Nurse educated regarding dehydration and patient states he will try to drink more water during the day. Patient did not have any further questions or concerns today and did confirm that he has this nurse's contact number to call her if needed.    Recommendations/Changes made from today's visit: Continue to limit the cigarettes you smoke to 10 or less a day Contact your doctor if you experience breathing difficulty for 48 hours (yellow zone) Drink 8 glasses of water a day to avoid dehydration Continue to limit your alcohol drinking to (3) 40 ounce beers a day Eat at least 1 large meal a day Snack frequently  Drink at least 1 Ensure a day Continue to walk daily Wash your hands frequently, wear a mask, avoid large crowds, get covid booster,  to reduce chances of infection  Subjective: [name] is an 56 y.o. year old male who is a primary patient of Osei-Bonsu, Greggory Stallion, MD. The care management team was consulted for assistance with care  management and/or care coordination needs.    RN Health Coach completed Telephone Visit today.   Objective:  Medications Reviewed Today     Reviewed by Wanda Plump, RN (Registered Nurse) on 09/14/21 at 1319  Med List Status: <None>   Medication Order Taking? Sig Documenting Provider Last Dose Status Informant  albuterol (PROVENTIL HFA;VENTOLIN HFA) 108 (90 Base) MCG/ACT inhaler [id number] Yes Inhale 2 puffs into the lungs every 4 (four) hours as needed for wheezing or shortness of breath. Parrett, Virgel Bouquet, NP Taking Active Self  Aspirin-Caffeine (BC FAST PAIN RELIEF PO) [id number] Yes Take 1 packet by mouth every 6 (six) hours as needed (for headaches). [provider] Taking Active Self  folic acid (FOLVITE) 1 MG tablet [id number] Yes 1 tab(s) [provider] Taking Active   gabapentin (NEURONTIN) 300 MG capsule [id number] No Take 300 mg by mouth 3 (three) times daily.   Patient not taking: Reported on 06/14/2021   [provider] Not Taking Active Self           Med Note Hoyt Koch, Nadalee Neiswender A   Tue Jun 14, 2021  2:45 PM) duplicate  gabapentin (NEURONTIN) 300 MG capsule [id number] Yes 1 cap(s) [provider] Taking Active   meloxicam (MOBIC) 7.5 MG tablet [id number] Yes Take 7.5 mg by mouth daily. [provider] Taking Active            Med Note Hoyt Koch, Woodley Petzold A   Tue Jun 14, 2021  2:45 PM) completed  meloxicam (  MOBIC) 7.5 MG tablet [id number] Yes 1-2 tab(s) [provider] Taking Active   pantoprazole (PROTONIX) 40 MG tablet [id number]  Take 1 tablet (40 mg total) by mouth daily. Kathi Der, MD  Expired 06/01/20 2359 Self  PHENobarbital (LUMINAL) 32.4 MG tablet [id number]  Take 1 tablet (32.4 mg total) by mouth 3 (three) times daily for 2 days, THEN 0.5 tablets (16.2 mg total) 3 (three) times daily for 2 days. Almon Hercules, MD  Expired 10/20/19 2359   SYMBICORT 160-4.5 MCG/ACT inhaler [id number] Yes SMARTSIG:2 Puff(s) By Mouth Twice Daily  [provider] Taking Active   thiamine 100 MG tablet [id number] Yes Take 1 tablet (100 mg total) by mouth daily. Almon Hercules, MD Taking Active   tiZANidine (ZANAFLEX) 2 MG tablet [id number] No Take 1-2 tablets by mouth every 8 (eight) hours as needed for muscle spasms.   Patient not taking: Reported on 06/14/2021   [provider] Not Taking Active Self           Med Note Hoyt Koch, Devetta Hagenow A   Tue Jun 14, 2021  2:47 PM) duplicate  tiZANidine (ZANAFLEX) 2 MG tablet [id number] Yes Take by mouth. [provider] Taking Active   Vitamin D3 (VITAMIN D) 25 MCG tablet [id number] Yes 1 tab(s) [provider] Taking Active              SDOH:  (Social Determinants of Health) assessments and interventions performed: SDOH assessments completed today and documented in the Epic system.      Care Plan  Review of patient past medical history, allergies, medications, health status, including review of consultants reports, laboratory and other test data, was performed as part of comprehensive evaluation for care management services.   Care Plan : COPD (Adult)  Updates made by Wanda Plump, RN since 09/14/2021 12:00 AM     Problem: Disease Progression (COPD) Resolved 09/14/2021  Priority: High     Long-Range Goal: Disease Progression Minimized or Managed Completed 09/14/2021  Start Date: 08/19/2020  Expected End Date: 08/19/2021  Note:   Resolving due to duplicate goal  Evidence-based guidance:  Identify current smoking/tobacco use; provide smoking cessation intervention.  Assess symptom control by the frequency and type of symptoms, reliever use and activity limitation at every encounter.  Assess risk for exacerbation (flare up) by evaluating spirometry, pulse oximetry, reliever use, presentation of symptoms and activity limitation; anticipate treatment adjustment based on risks and resources.  Develop and/or review and reinforce use of COPD rescue (action) plan  even when symptoms are controlled or infrequent.  Ask patient to bring inhaler to all visits; assess and reinforce correct technique; address barriers to proper inhaler use, such as older age, use of multiple devices and lack of understanding.   Identify symptom triggers, such as smoking, virus, weather change, emotional upset, exercise, obesity and environmental allergen; consider reduction of work-exposure versus elimination to avoid compromising employment.  Correlate presentation to comorbidity, such as diabetes, heart failure, obstructive sleep apnea, depression and anxiety, which may worsen symptoms.  Prepare for individualized pharmacologic therapy that may include LABA (long-acting beta-2 agonist), LAMA (long-acting muscarinic antagonist), SABA (short-acting beta-2 agonist) oral or inhaled corticosteroid.  Promote participation in pulmonary rehabilitation for breathing exercises, skills training, improved exercise capacity, mood and quality of life; address barriers to participation.  Promote physical activity or exercise to improve or maintain exercise capacity, based on tolerance that may include walking, water exercise, cycling or limb muscle strength training.  Promote use of energy conservation  and activity pacing techniques.  Promote use of breathing and coughing techniques, such as inspiratory muscle training, pursed-lip breathing, diaphragmatic breathing, pranayama yoga breathing or huff cough.  Screen for malnutrition risk factors, such as unintentional weight loss and poor oral intake; refer to dietitian if identified.  Consider recommendation for oral drink supplement or multivitamin and mineral supplements if suspect inadequate oral intake or micronutrient deficiencies.   Screen for obstructive sleep apnea; prepare patient for polysomnography based on risk and presentation.  Prepare patient for use of long-term oxygen and noninvasive ventilation to relieve hypercapnia, hypoxemia,  obstructive sleep apnea and reduce work of breathing.  Prepare patient with worsening disease for surgical interventions that may include bronchoscopy, lung volume reduction surgery, bullectomy or lung transplantation.   Notes:     Task: Alleviate Barriers to COPD Management Completed 09/14/2021  Due Date: 08/19/2021  Note:   Care Management Activities:    - activity or exercise based on tolerance encouraged - barriers to treatment managed - communicable disease prevention promoted - individualized medical nutrition therapy provided - medication-adherence assessment completed - rescue (action) plan reviewed - smoking counseling provided - self-awareness of symptom triggers encouraged    Notes:     Problem: Symptom Exacerbation (COPD) Resolved 09/14/2021  Priority: High     Long-Range Goal: Symptom Exacerbation Prevented or Minimized Completed 09/14/2021  Start Date: 08/19/2020  Expected End Date: 08/19/2021  Note:   Resolving due to duplicate goal  Evidence-based guidance:  Monitor for signs of respiratory infection, including changes in sputum color, volume and thickness, as well as fever.  Encourage infection prevention strategies that may include prophylactic antibiotic therapy for patients with history of frequent exacerbations or antibiotic administration during exacerbation based on presentation, risk and benefit.  Encourage receipt of influenza and pneumococcal vaccine.  Prepare patient for use of home long-term oxygen therapy in presence of sever resting hypoxemia.  Prepare patients for laboratory studies or diagnostic exams, such as spirometry, pulse oximetry and arterial blood gas based on current symptoms, risk factors and presentation.  Assess barriers and manage adherence, including inhaler technique and persistent trigger exposure; encourage adherence, even when symptoms are controlled or infrequent.  Assess and monitor for signs/symptoms of psychosocial concerns,  such as shortness of breath-anxiety cycle or depression that may impact stability of symptoms.  Identify economic resources, sociocultural beliefs, social factors and health literacy that may interfere with adherence.  Promote lifestyle changes when needed, including regular physical activity based on tolerance, weight loss, healthy eating and stress management.  Consider referral to nurse or community health worker or home-visiting program for intensive support and education (disease-management program).  Increase frequency of follow-up following exacerbation or hospitalization; consider transition of care interventions, such as hospital visit, home visit, telephone follow-up, review of discharge summary and resource referrals.   Notes:     Task: Identify and Minimize Risk of COPD Exacerbation Completed 09/14/2021  Due Date: 08/19/2021  Note:   Care Management Activities:    - barriers to lifestyle changes reviewed and addressed - healthy lifestyle promoted - rescue (action) plan reviewed - signs/symptoms of worsening disease assessed    Notes:     Care Plan : RN Care Manager Plan of Care  Updates made by Wanda Plump, RN since 09/14/2021 12:00 AM     Problem: Knowledge Deficit Related to COPD and the need to Gain Weight   Priority: High     Long-Range Goal: Development of Plan of Care for the Management of COPD and the  need to Gain Weight   Start Date: 09/14/2021  Expected End Date: 09/19/2021  Priority: High  Note:   Current Barriers:  Knowledge Deficits related to plan of care for management of COPD  Needs to gain weight to have back surgery but alcoholism causes the patient not to eat; patient states he is not ready to quit drinking alcohol  RNCM Clinical Goal(s):  Patient will verbalize understanding of plan for management of COPD and plan to gain weight as evidenced by continuation of using controlled and rescue inhalers as prescribed; beginning to eat one large meal per  day, and drinking one Ensure per day continue to work with RN Care Manager to address care management and care coordination needs related to  COPD and and plan to gain weight as evidenced by adherence to CM Team Scheduled appointments through collaboration with RN Care manager, provider, and care team.   Interventions: Inter-disciplinary care team collaboration (see longitudinal plan of care) Evaluation of current treatment plan related to  self management and patient's adherence to plan as established by provider   COPD Interventions:  (Status:  Goal on track:  Yes.) Long Term Goal Provided patient with basic written and verbal COPD education on self care/management/and exacerbation prevention Advised patient to track and manage COPD triggers Provided instruction about proper use of medications used for management of COPD including inhalers Advised patient to self assesses COPD action plan zone and make appointment with provider if in the yellow zone for 48 hours without improvement Advised patient to engage in light exercise as tolerated 3-5 days a week to aid in the the management of COPD Provided education about and advised patient to utilize infection prevention strategies to reduce risk of respiratory infection Encouraged patient to continue to limit the amount of cigarettes he smokes to 10 or less per day; patient previously received smoking cessation resources and states he is not ready to quit smoking  Previously sent COPD educational packet that has color zones and rescue action plans  Weight Interventions: (Status: Goal on Track: No) Long Term Goal -Discussed decreasing the amount of beer the patient drinks to (3) 40 ounce beers per day -Encouraged the patient to eat a large meal before he starts to drink alcohol (drinking alcohol causes him to not have an appetite) -Discussed drinking 8 glasses of water a day to decrease chances of dehydration -Discussed drinking at least 1 Ensure  daily -Discussed eating high calorie snacks frequently -Will send education regarding high calorie and high protein foods  Patient Goals/Self-Care Activities: Take all medications as prescribed Attend all scheduled provider appointments Call pharmacy for medication refills 3-7 days in advance of running out of medications Call provider office for new concerns or questions  limit outdoor activity during cold weather eliminate symptom triggers at home use an extra pillow to sleep Continue to limit the cigarettes you smoke to 10 or less a day Contact your doctor if you experience breathing difficulty for 48 hours (yellow zone) Drink 8 glasses of water a day to avoid dehydration Continue to limit your alcohol drinking to (3) 40 ounce beers a day Eat at least 1 large meal a day Snack frequently  Drink at least 1 Ensure a day Continue to walk daily Wash your hands frequently, wear a mask, avoid large crowds, get covid booster,  to reduce chances of infection  Follow Up Plan:  Telephone follow up appointment with care management team member scheduled for:  April       Plan:  Telephone follow up appointment with care management team member scheduled for:  April. Nurse will send PCP today's assessment note.   Blanchie ServeJill Ellen Goris RN, Mining engineerBSN Nurse Health Coach [facility] [phone] [email or web address]

## 2021-09-14 NOTE — Patient Instructions (Signed)
Visit Information  Thank you for taking time to visit with me today. Please don't hesitate to contact me if I can be of assistance to you before our next scheduled telephone appointment.  Following are the goals we discussed today:  Patient Goals/Self-Care Activities: Take all medications as prescribed Attend all scheduled provider appointments Call pharmacy for medication refills 3-7 days in advance of running out of medications Call provider office for new concerns or questions  limit outdoor activity during cold weather eliminate symptom triggers at home use an extra pillow to sleep Continue to limit the cigarettes you smoke to 10 or less a day Contact your doctor if you experience breathing difficulty for 48 hours (yellow zone) Drink 8 glasses of water a day to avoid dehydration Continue to limit your alcohol drinking to (3) 40 ounce beers a day Eat at least 1 large meal a day Snack frequently  Drink at least 1 Ensure a day Continue to walk daily Wash your hands frequently, wear a mask, avoid large crowds, get covid booster,  to reduce chances of infection  The patient verbalized understanding of instructions, educational materials, and care plan provided today and agreed to receive a mailed copy of patient instructions, educational materials, and care plan.   Telephone follow up appointment with care management team member scheduled for: April  [name] RN, Mining engineer [facility] [phone] [email or web address]

## 2021-12-07 DIAGNOSIS — J449 Chronic obstructive pulmonary disease, unspecified: Secondary | ICD-10-CM | POA: Diagnosis not present

## 2021-12-07 DIAGNOSIS — Z131 Encounter for screening for diabetes mellitus: Secondary | ICD-10-CM | POA: Diagnosis not present

## 2021-12-07 DIAGNOSIS — Z1329 Encounter for screening for other suspected endocrine disorder: Secondary | ICD-10-CM | POA: Diagnosis not present

## 2021-12-07 DIAGNOSIS — G8929 Other chronic pain: Secondary | ICD-10-CM | POA: Diagnosis not present

## 2021-12-07 DIAGNOSIS — R739 Hyperglycemia, unspecified: Secondary | ICD-10-CM | POA: Diagnosis not present

## 2021-12-07 DIAGNOSIS — E782 Mixed hyperlipidemia: Secondary | ICD-10-CM | POA: Diagnosis not present

## 2021-12-07 DIAGNOSIS — Z0001 Encounter for general adult medical examination with abnormal findings: Secondary | ICD-10-CM | POA: Diagnosis not present

## 2021-12-07 DIAGNOSIS — R5383 Other fatigue: Secondary | ICD-10-CM | POA: Diagnosis not present

## 2021-12-07 DIAGNOSIS — M5442 Lumbago with sciatica, left side: Secondary | ICD-10-CM | POA: Diagnosis not present

## 2021-12-07 DIAGNOSIS — R202 Paresthesia of skin: Secondary | ICD-10-CM | POA: Diagnosis not present

## 2021-12-15 ENCOUNTER — Other Ambulatory Visit: Payer: Self-pay | Admitting: *Deleted

## 2021-12-15 NOTE — Patient Outreach (Signed)
[facility] Customer service manager [facility]) Care Management ? ?12/15/2021 ? ?Sharren Bridge Voris ?14-Dec-1965 ?[id number] ? ?Unsuccessful outreach attempt made to patient. Patient answered the phone and stated that he would not be able to speak today. Patient did request that this nurse call him back tomorrow.  ? ?Plan: ?RN Health Coach will call patient on 12/16/21. ? ?[name] RN, BSN ?[facility] Care Management  ?RN Health Coach ?[phone] ?[email or web address] ? ? ?

## 2021-12-16 ENCOUNTER — Other Ambulatory Visit: Payer: Self-pay | Admitting: *Deleted

## 2021-12-16 NOTE — Patient Outreach (Addendum)
[facility] Customer service manager [facility]) Care Management ? ?12/16/2021 ? ?Sharren Bridge Popson ?17-Nov-1965 ?[id number] ? ?Unsuccessful outreach attempt made to patient. Patient answered the phone and stated that he would not be able to speak today. Patient did request that this nurse call back at a later date.  ? ?Plan: ?RN Health Coach will call patient within the month of June. ? ?[name] RN, BSN ?[facility] Care Management  ?RN Health Coach ?[phone] ?[email or web address] ? ? ?

## 2021-12-28 NOTE — Progress Notes (Deleted)
Synopsis: Referred for COPD with chronic bronchitis since 07/2021 by Jackie Plum, MD  Subjective:   PATIENT ID: Judithe Modest GENDER: male DOB: September 05, 1965, MRN: [id number]  No chief complaint on file.  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 Marchelle Gearing, emphysema, HTN    Otherwise pertinent review of systems is negative.  Past Medical History:  Diagnosis Date   Alcohol abuse    Allergy    Anxiety    Arthritis    Asthma    COPD (chronic obstructive pulmonary disease) (HCC)    Depression    Hypertension    Shortness of breath      Family History  Problem Relation Age of Onset   Hypertension Mother    Hypertension Father    Hypertension Brother      Past Surgical History:  Procedure Laterality Date   BACK SURGERY     BIOPSY  06/02/2019   Procedure: BIOPSY;  Surgeon: Kathi Der, MD;  Location: [facility];  Service: Gastroenterology;;   COLONOSCOPY WITH PROPOFOL N/A 06/02/2019   Procedure: COLONOSCOPY WITH PROPOFOL;  Surgeon: Kathi Der, MD;  Location: [facility];  Service: Gastroenterology;  Laterality: N/A;   ESOPHAGOGASTRODUODENOSCOPY (EGD) WITH PROPOFOL N/A 06/02/2019   Procedure: ESOPHAGOGASTRODUODENOSCOPY (EGD) WITH PROPOFOL;  Surgeon: Kathi Der, MD;  Location: [facility];  Service: Gastroenterology;  Laterality: N/A;   HEMORRHOID SURGERY     HERNIA REPAIR     MANDIBLE FRACTURE SURGERY     POLYPECTOMY  06/02/2019   Procedure: POLYPECTOMY;  Surgeon: Kathi Der, MD;  Location: [facility];  Service: Gastroenterology;;    Social History   Socioeconomic History   Marital status: Legally Separated    Spouse name: Not on file   Number of children: Not on file   Years of education: 12   Highest education level: 12th grade  Occupational History   Not on file  Tobacco Use   Smoking status: Every Day    Packs/day:  0.50    Years: 35.00    Pack years: 17.50    Types: Cigarettes   Smokeless tobacco: Never   Tobacco comments:    States he smokes about 10 cigarettes a day. Explains he is not ready to quit.  Vaping Use   Vaping Use: Never used  Substance and Sexual Activity   Alcohol use: Yes    Alcohol/week: 6.0 standard drinks    Types: 6 Cans of beer per week    Comment: patient states he drinks 3-4 40 ounce bottles of beer daily. Patient states this is less than before and he does not want to try to quit. He will continue to cut down on the amount   Drug use: No   Sexual activity: Yes    Birth control/protection: None  Other Topics Concern   Not on file  Social History Narrative   Not on file   Social Determinants of Health   Financial Resource Strain: Not on file  Food Insecurity: No Food Insecurity   Worried About Running Out of Food in the Last Year: Never true   Ran Out of Food in the Last Year: Never true  Transportation Needs: No Transportation Needs   Lack of Transportation (Medical): No   Lack of Transportation (Non-Medical): No  Physical Activity: Not on file  Stress: Not on file  Social Connections: Not on file  Intimate Partner Violence: Not on file  No Known Allergies   Outpatient Medications Prior to Visit  Medication Sig Dispense Refill   albuterol (PROVENTIL HFA;VENTOLIN HFA) 108 (90 Base) MCG/ACT inhaler Inhale 2 puffs into the lungs every 4 (four) hours as needed for wheezing or shortness of breath. 1 Inhaler 3   Aspirin-Caffeine (BC FAST PAIN RELIEF PO) Take 1 packet by mouth every 6 (six) hours as needed (for headaches).     folic acid (FOLVITE) 1 MG tablet 1 tab(s)     gabapentin (NEURONTIN) 300 MG capsule Take 300 mg by mouth 3 (three) times daily.  (Patient not taking: Reported on 06/14/2021)     gabapentin (NEURONTIN) 300 MG capsule 1 cap(s)     meloxicam (MOBIC) 7.5 MG tablet Take 7.5 mg by mouth daily.     meloxicam (MOBIC) 7.5 MG tablet 1-2 tab(s)      pantoprazole (PROTONIX) 40 MG tablet Take 1 tablet (40 mg total) by mouth daily. 30 tablet 1   PHENobarbital (LUMINAL) 32.4 MG tablet Take 1 tablet (32.4 mg total) by mouth 3 (three) times daily for 2 days, THEN 0.5 tablets (16.2 mg total) 3 (three) times daily for 2 days. 9 tablet 0   SYMBICORT 160-4.5 MCG/ACT inhaler SMARTSIG:2 Puff(s) By Mouth Twice Daily     thiamine 100 MG tablet Take 1 tablet (100 mg total) by mouth daily. 90 tablet 1   tiZANidine (ZANAFLEX) 2 MG tablet Take 1-2 tablets by mouth every 8 (eight) hours as needed for muscle spasms.  (Patient not taking: Reported on 06/14/2021)     tiZANidine (ZANAFLEX) 2 MG tablet Take by mouth.     Vitamin D3 (VITAMIN D) 25 MCG tablet 1 tab(s)     No facility-administered medications prior to visit.       Objective:   Physical Exam:  General appearance: 56 y.o., male, NAD, conversant  Eyes: anicteric sclerae; PERRL, tracking appropriately HENT: NCAT; MMM Neck: Trachea midline; no lymphadenopathy, no JVD Lungs: CTAB, no crackles, no wheeze, with normal respiratory effort CV: RRR, no murmur  Abdomen: Soft, non-tender; non-distended, BS present  Extremities: No peripheral edema, warm Skin: Normal turgor and texture; no rash Psych: Appropriate affect Neuro: Alert and oriented to person and place, no focal deficit     There were no vitals filed for this visit.   on *** LPM *** RA BMI Readings from Last 3 Encounters:  10/11/19 20.36 kg/m  06/02/19 20.80 kg/m  10/29/17 21.13 kg/m   Wt Readings from Last 3 Encounters:  10/11/19 122 lb 5.7 oz (55.5 kg)  06/02/19 125 lb (56.7 kg)  10/29/17 130 lb 14.4 oz (59.4 kg)     CBC    Component Value Date/Time   WBC 3.0 (L) 07/04/2021 2113   RBC 3.53 (L) 07/04/2021 2113   HGB 13.0 07/04/2021 2113   HCT 35.6 (L) 07/04/2021 2113   PLT 146 (L) 07/04/2021 2113   MCV 100.8 (H) 07/04/2021 2113   MCV 102.9 (A) 11/17/2015 1252   MCH 36.8 (H) 07/04/2021 2113   MCHC 36.5 (H)  07/04/2021 2113   RDW 15.2 07/04/2021 2113   LYMPHSABS 1.3 08/07/2017 1716   MONOABS 0.8 08/07/2017 1716   EOSABS 0.0 08/07/2017 1716   BASOSABS 0.1 08/07/2017 1716    ***  Chest Imaging: CTA Chest 10/11/19 reviewed by me with emphysema and lower lobe atelectasis  Pulmonary Functions Testing Results:    Latest Ref Rng & Units 08/31/2017   10:50 AM  PFT Results  FVC-Pre L 4.38    FVC-Predicted  Pre % 122    Pre FEV1/FVC % % 65    FEV1-Pre L 2.85    FEV1-Predicted Pre % 99    DLCO uncorrected ml/min/mmHg 21.23    DLCO UNC% % 78    DLCO corrected ml/min/mmHg 22.24    DLCO COR %Predicted % 82    DLVA Predicted % 85     Reviewed by me mild osbtruction very mildly reduced diffusing capacity       Assessment & Plan:    Plan:      Omar Person, MD [facility] 12/28/2021 12:54 PM

## 2021-12-29 ENCOUNTER — Institutional Professional Consult (permissible substitution): Payer: Medicare Other | Admitting: Student

## 2022-01-24 ENCOUNTER — Other Ambulatory Visit: Payer: Self-pay | Admitting: *Deleted

## 2022-01-24 NOTE — Patient Outreach (Signed)
[facility]) Care Management  01/24/2022  JERMIAH SODERMAN 06/19/1966 [id number]  Unsuccessful outreach attempt made to patient. RN Health Coach left HIPAA compliant voicemail message along with her contact information.  Plan: RN Health Coach will call patient within the month of July.  [name] RN, BSN [facility] Care Management  RN Health Coach [phone] [email or web address]

## 2022-01-25 DIAGNOSIS — G8929 Other chronic pain: Secondary | ICD-10-CM | POA: Diagnosis not present

## 2022-01-25 DIAGNOSIS — E782 Mixed hyperlipidemia: Secondary | ICD-10-CM | POA: Diagnosis not present

## 2022-01-25 DIAGNOSIS — J449 Chronic obstructive pulmonary disease, unspecified: Secondary | ICD-10-CM | POA: Diagnosis not present

## 2022-01-25 DIAGNOSIS — R202 Paresthesia of skin: Secondary | ICD-10-CM | POA: Diagnosis not present

## 2022-01-25 DIAGNOSIS — M5442 Lumbago with sciatica, left side: Secondary | ICD-10-CM | POA: Diagnosis not present

## 2022-02-02 ENCOUNTER — Other Ambulatory Visit: Payer: Self-pay | Admitting: *Deleted

## 2022-02-02 NOTE — Patient Outreach (Signed)
[facility]) Care Management  02/02/2022  [name] Dec 31, 1965 [id number]  Multiple attempts to establish contact with patient without success. No response from letter mailed to patient. Case is being closed at this time. RN Health Coach left HIPAA compliant message along with her contact information.  Plan: RN Health Coach will close case and will send PCP and patient a case closed letter.   [name] RN, BSN [facility] Care Management  RN Health Coach [phone] [name]

## 2022-03-14 ENCOUNTER — Ambulatory Visit: Payer: Self-pay | Admitting: *Deleted

## 2022-05-07 ENCOUNTER — Emergency Department (HOSPITAL_COMMUNITY)
Admission: EM | Admit: 2022-05-07 | Discharge: 2022-05-08 | Disposition: A | Discharge status: Home / Self Care | Payer: Medicare Other | Attending: Emergency Medicine | Admitting: Emergency Medicine

## 2022-05-07 ENCOUNTER — Emergency Department (HOSPITAL_COMMUNITY): Payer: Medicare Other

## 2022-05-07 DIAGNOSIS — D72819 Decreased white blood cell count, unspecified: Secondary | ICD-10-CM | POA: Insufficient documentation

## 2022-05-07 DIAGNOSIS — F1010 Alcohol abuse, uncomplicated: Secondary | ICD-10-CM | POA: Insufficient documentation

## 2022-05-07 DIAGNOSIS — R6889 Other general symptoms and signs: Secondary | ICD-10-CM | POA: Diagnosis not present

## 2022-05-07 DIAGNOSIS — E871 Hypo-osmolality and hyponatremia: Secondary | ICD-10-CM | POA: Diagnosis not present

## 2022-05-07 DIAGNOSIS — R55 Syncope and collapse: Secondary | ICD-10-CM | POA: Diagnosis not present

## 2022-05-07 DIAGNOSIS — Z7982 Long term (current) use of aspirin: Secondary | ICD-10-CM | POA: Insufficient documentation

## 2022-05-07 DIAGNOSIS — J449 Chronic obstructive pulmonary disease, unspecified: Secondary | ICD-10-CM | POA: Insufficient documentation

## 2022-05-07 DIAGNOSIS — Z7951 Long term (current) use of inhaled steroids: Secondary | ICD-10-CM | POA: Insufficient documentation

## 2022-05-07 DIAGNOSIS — Y908 Blood alcohol level of 240 mg/100 ml or more: Secondary | ICD-10-CM | POA: Diagnosis not present

## 2022-05-07 DIAGNOSIS — M79621 Pain in right upper arm: Secondary | ICD-10-CM | POA: Diagnosis not present

## 2022-05-07 DIAGNOSIS — R110 Nausea: Secondary | ICD-10-CM | POA: Diagnosis not present

## 2022-05-07 DIAGNOSIS — M79601 Pain in right arm: Secondary | ICD-10-CM | POA: Diagnosis not present

## 2022-05-07 DIAGNOSIS — Z79899 Other long term (current) drug therapy: Secondary | ICD-10-CM | POA: Diagnosis not present

## 2022-05-07 DIAGNOSIS — R531 Weakness: Secondary | ICD-10-CM | POA: Diagnosis not present

## 2022-05-07 DIAGNOSIS — Z743 Need for continuous supervision: Secondary | ICD-10-CM | POA: Diagnosis not present

## 2022-05-07 LAB — CBC WITH DIFFERENTIAL/PLATELET
Abs Immature Granulocytes: 0.02 10*3/uL (ref 0.00–0.07)
Basophils Absolute: 0.1 10*3/uL (ref 0.0–0.1)
Basophils Relative: 2 %
Eosinophils Absolute: 0.1 10*3/uL (ref 0.0–0.5)
Eosinophils Relative: 3 %
HCT: 32.3 % — ABNORMAL LOW (ref 39.0–52.0)
Hemoglobin: 12 g/dL — ABNORMAL LOW (ref 13.0–17.0)
Immature Granulocytes: 1 %
Lymphocytes Relative: 39 %
Lymphs Abs: 1.3 10*3/uL (ref 0.7–4.0)
MCH: 36.7 pg — ABNORMAL HIGH (ref 26.0–34.0)
MCHC: 37.2 g/dL — ABNORMAL HIGH (ref 30.0–36.0)
MCV: 98.8 fL (ref 80.0–100.0)
Monocytes Absolute: 0.5 10*3/uL (ref 0.1–1.0)
Monocytes Relative: 15 %
Neutro Abs: 1.3 10*3/uL — ABNORMAL LOW (ref 1.7–7.7)
Neutrophils Relative %: 40 %
Platelets: 135 10*3/uL — ABNORMAL LOW (ref 150–400)
RBC: 3.27 MIL/uL — ABNORMAL LOW (ref 4.22–5.81)
RDW: 14.6 % (ref 11.5–15.5)
WBC: 3.2 10*3/uL — ABNORMAL LOW (ref 4.0–10.5)
nRBC: 0 % (ref 0.0–0.2)

## 2022-05-07 LAB — COMPREHENSIVE METABOLIC PANEL
ALT: 51 U/L — ABNORMAL HIGH (ref 0–44)
AST: 117 U/L — ABNORMAL HIGH (ref 15–41)
Albumin: 3.8 g/dL (ref 3.5–5.0)
Alkaline Phosphatase: 68 U/L (ref 38–126)
Anion gap: 14 (ref 5–15)
BUN: <5 mg/dL — ABNORMAL LOW (ref 6–20)
CO2: 21 mmol/L — ABNORMAL LOW (ref 22–32)
Calcium: 8.2 mg/dL — ABNORMAL LOW (ref 8.9–10.3)
Chloride: 95 mmol/L — ABNORMAL LOW (ref 98–111)
Creatinine, Ser: 0.56 mg/dL — ABNORMAL LOW (ref 0.61–1.24)
GFR, Estimated: >60 mL/min (ref >60)
Glucose, Bld: 97 mg/dL (ref 70–99)
Potassium: 3.5 mmol/L (ref 3.5–5.1)
Sodium: 130 mmol/L — ABNORMAL LOW (ref 135–145)
Total Bilirubin: <0.1 mg/dL — ABNORMAL LOW (ref 0.3–1.2)
Total Protein: 6.8 g/dL (ref 6.5–8.1)

## 2022-05-07 LAB — ETHANOL: Alcohol, Ethyl (B): 330 mg/dL (ref <10)

## 2022-05-07 LAB — CBG MONITORING, ED: Glucose-Capillary: 111 mg/dL — ABNORMAL HIGH (ref 70–99)

## 2022-05-07 LAB — MAGNESIUM: Magnesium: 1.8 mg/dL (ref 1.7–2.4)

## 2022-05-07 MED ORDER — LACTATED RINGERS IV BOLUS
1000.0000 mL | Freq: Once | INTRAVENOUS | Status: AC
  Administered 2022-05-07: 1000 mL via INTRAVENOUS

## 2022-05-07 MED ORDER — ONDANSETRON HCL 4 MG/2ML IJ SOLN
4.0000 mg | Freq: Once | INTRAMUSCULAR | Status: AC
  Administered 2022-05-07: 4 mg via INTRAVENOUS
  Filled 2022-05-07: qty 2

## 2022-05-07 NOTE — ED Provider Notes (Cosign Needed Addendum)
[facility] EMERGENCY DEPARTMENT Provider Note   CSN: [id number] Arrival date & time: 05/07/22  1842     History  Chief Complaint  Patient presents with   Loss of Consciousness    [name] is a 56 y.o. male. Presenting after episode of LOC while at friends house in setting of ETOH use.  Patient reports that he was at his friend's house watching football.  He drank 3 40 ounce beers.  He states he does usually drink the same amount daily.  He states he then began to get sick and throw up.  And that is when EMS was called.  Patient denies a fall today, but he does report a fall a couple weeks ago in which he landed on his right arm.  He reports right arm pain since that time. 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  Loss of Consciousness Associated symptoms: no chest pain, no fever, no palpitations, no seizures, no shortness of breath and no vomiting        Home Medications Prior to Admission medications   Medication Sig Start Date End Date Taking? Authorizing Provider  albuterol (PROVENTIL HFA;VENTOLIN HFA) 108 (90 Base) MCG/ACT inhaler Inhale 2 puffs into the lungs every 4 (four) hours as needed for wheezing or shortness of breath. 08/31/17   Parrett, Fonnie Mu, NP  Aspirin-Caffeine (BC FAST PAIN RELIEF PO) Take 1 packet by mouth every 6 (six) hours as needed (for headaches).    [provider]  folic acid (FOLVITE) 1 MG tablet 1 tab(s)    [provider]  gabapentin (NEURONTIN) 300 MG capsule Take 300 mg by mouth 3 (three) times daily.  Patient not taking: Reported on 06/14/2021 02/11/19   [provider]  gabapentin (NEURONTIN) 300 MG capsule 1 cap(s)    [provider]  meloxicam (MOBIC) 7.5 MG tablet Take 7.5 mg by mouth daily. 01/09/19   [provider]  meloxicam (MOBIC) 7.5 MG tablet 1-2 tab(s)    [provider]  pantoprazole (PROTONIX) 40 MG tablet Take 1 tablet (40 mg total)  by mouth daily. 06/02/19 06/01/20  Brahmbhatt, Orson Gear, MD  PHENobarbital (LUMINAL) 32.4 MG tablet Take 1 tablet (32.4 mg total) by mouth 3 (three) times daily for 2 days, THEN 0.5 tablets (16.2 mg total) 3 (three) times daily for 2 days. 10/16/19 10/20/19  Mercy Riding, MD  SYMBICORT 160-4.5 MCG/ACT inhaler SMARTSIG:2 Puff(s) By Mouth Twice Daily 03/15/21   [provider]  thiamine 100 MG tablet Take 1 tablet (100 mg total) by mouth daily. 10/16/19   Mercy Riding, MD  tiZANidine (ZANAFLEX) 2 MG tablet Take 1-2 tablets by mouth every 8 (eight) hours as needed for muscle spasms.  Patient not taking: Reported on 06/14/2021 01/08/19   [provider]  tiZANidine (ZANAFLEX) 2 MG tablet Take by mouth.    [provider]  Vitamin D3 (VITAMIN D) 25 MCG tablet 1 tab(s)    [provider]      Allergies    Patient has no known allergies.    Review of Systems   Review of Systems  Constitutional:  Negative for chills and fever.  HENT:  Negative for ear pain and sore throat.   Eyes:  Negative for pain and visual disturbance.  Respiratory:  Negative for cough and shortness of breath.   Cardiovascular:  Positive for syncope. Negative for chest pain and palpitations.  Gastrointestinal:  Negative for abdominal  pain and vomiting.  Genitourinary:  Negative for dysuria and hematuria.  Musculoskeletal:  Negative for arthralgias and back pain.  Skin:  Negative for color change and rash.  Neurological:  Negative for seizures and syncope.  All other systems reviewed and are negative.   Physical Exam Updated Vital Signs BP (!) 155/94   Pulse 79   Temp (!) 97.3 F (36.3 C) (Oral)   Resp 11   Ht 5\' 5"  (1.651 m)   Wt 56 kg   SpO2 97%   BMI 20.54 kg/m  Physical Exam Vitals and nursing note reviewed.  Constitutional:      General: He is not in acute distress.    Appearance: He is well-developed.  HENT:     Head: Normocephalic and atraumatic.  Eyes:      Conjunctiva/sclera: Conjunctivae normal.  Cardiovascular:     Rate and Rhythm: Normal rate and regular rhythm.     Heart sounds: No murmur heard. Pulmonary:     Effort: Pulmonary effort is normal. No respiratory distress.     Breath sounds: Normal breath sounds.  Abdominal:     Palpations: Abdomen is soft.     Tenderness: There is no abdominal tenderness.  Musculoskeletal:        General: No swelling.     Cervical back: Neck supple.  Skin:    General: Skin is warm and dry.     Capillary Refill: Capillary refill takes less than 2 seconds.  Neurological:     Mental Status: He is alert and oriented to person, place, and time.     Comments: Appears clinically intoxicated Blind in right eye, baseline Equal strength and sensation in bilateral upper and lower extremities  Psychiatric:        Mood and Affect: Mood normal.     ED Results / Procedures / Treatments   Labs (all labs ordered are listed, but only abnormal results are displayed) Labs Reviewed  CBC WITH DIFFERENTIAL/PLATELET - Abnormal; Notable for the following components:      Result Value   WBC 3.2 (*)    RBC 3.27 (*)    Hemoglobin 12.0 (*)    HCT 32.3 (*)    MCH 36.7 (*)    MCHC 37.2 (*)    Platelets 135 (*)    Neutro Abs 1.3 (*)    All other components within normal limits  COMPREHENSIVE METABOLIC PANEL - Abnormal; Notable for the following components:   Sodium 130 (*)    Chloride 95 (*)    CO2 21 (*)    BUN <5 (*)    Creatinine, Ser 0.56 (*)    Calcium 8.2 (*)    AST 117 (*)    ALT 51 (*)    Total Bilirubin <0.1 (*)    All other components within normal limits  ETHANOL - Abnormal; Notable for the following components:   Alcohol, Ethyl (B) 330 (*)    All other components within normal limits  CBG MONITORING, ED - Abnormal; Notable for the following components:   Glucose-Capillary 111 (*)    All other components within normal limits  MAGNESIUM    EKG None  Radiology CT Head Wo Contrast  Result  Date: 05/07/2022 CLINICAL DATA:  Recent loss of consciousness EXAM: CT HEAD WITHOUT CONTRAST TECHNIQUE: Contiguous axial images were obtained from the base of the skull through the vertex without intravenous contrast. RADIATION DOSE REDUCTION: This exam was performed according to the departmental dose-optimization program which includes automated exposure control, adjustment of  the mA and/or kV according to patient size and/or use of iterative reconstruction technique. COMPARISON:  02/23/2011 FINDINGS: Brain: No evidence of acute infarction, hemorrhage, hydrocephalus, extra-axial collection or mass lesion/mass effect. Mild atrophic changes are noted. Basal ganglia calcifications are seen. Vascular: No hyperdense vessel or unexpected calcification. Skull: Normal. Negative for fracture or focal lesion. Sinuses/Orbits: Paranasal sinuses show small mucosal retention cyst in the left maxillary antrum. Orbits and their contents are within normal limits. Other: None IMPRESSION: Chronic atrophic changes without acute abnormality. Electronically Signed   By: Inez Catalina M.D.   On: 05/07/2022 21:27   DG Humerus Right  Result Date: 05/07/2022 CLINICAL DATA:  Right humerus pain, loss of consciousness. EXAM: RIGHT HUMERUS - 2+ VIEW COMPARISON:  CT chest dated 10/11/2019. FINDINGS: There is no evidence of fracture or other focal bone lesions. Soft tissues are unremarkable. IMPRESSION: Negative. Electronically Signed   By: Zerita Boers M.D.   On: 05/07/2022 19:45    Procedures Procedures    Medications Ordered in ED Medications  ondansetron (ZOFRAN) injection 4 mg (4 mg Intravenous Given 05/07/22 1928)  lactated ringers bolus 1,000 mL (0 mLs Intravenous Stopped 05/07/22 2032)    ED Course/ Medical Decision Making/ A&P                           Medical Decision Making Amount and/or Complexity of Data Reviewed Labs: ordered. Radiology: ordered.  Risk Prescription drug management.    56 year old male with  past medical history of COPD, ETOH abuse/ withdrawal, HLD presenting via EMS after episode of decreased responsiveness at friends house in setting of ETOH use.  EMS reports initial BP 94/60 and 300cc IVF given, BP now 107/72 on arrival.  EMS reported patient was initially difficult to arouse, but gradually became more alert on route.   On arrival, patient has stable vital signs. He is GCS 15, nonfocal neurologic exam.  Differential diagnosis includes intoxication, syncope, arrhythmia, electrolyte abnormalities, seizure, AKI, dehydration.   EKG showed sinus rhythm, rate 68.  Normal axis.  No ST elevations or depressions.  QTc within normal limits at 45.   Labs notable for mild hyponatremia to 130, no AKI.  Leukopenia to 3.2.  Hemoglobin 12.0.  EtOH 330.  Patient given 1 L IV fluids and Zofran. CT head reassuring, did not show any acute changes, specifically did not show any intracranial hemorrhage.  Overall, concern for intoxication.  On reevaluation, patient able to ambulate at baseline and is tolerating po. He has safe discharge plan, sister will pick him up. Recommended proper hydration and follow up with primary doctors. Strict return precautions given. Patient felt comfortable with discharge.    Final Clinical Impression(s) / ED Diagnoses Final diagnoses:  ETOH abuse    Rx / DC Orders ED Discharge Orders     None         Rosine Abe, MD 05/07/22 0300    Elnora Morrison, MD 05/07/22 2342    Rosine Abe, MD 05/07/22 2345    Elnora Morrison, MD 05/08/22 2357

## 2022-05-07 NOTE — ED Notes (Signed)
Pt assisted to bedside commode. Two person assist required to stand and assist patient to commode. Pt states this is his normal level of weakness. Uses a cane to ambulate at baseline. Moderate amount of liquid, brown stool. Pt assisted back into bed.

## 2022-05-07 NOTE — ED Triage Notes (Signed)
Pt arrived via [facility] for loss of consciousness from friends house. Bystanders on scene report pt was sitting in chair and suddenly slumped down in chair and began drooling. Bystanders report ETOH consumption prior to event (pt states 3 beers). For EMS pt was initally difficult to arouse, but pt became more alert and oriented throughout transport and is A&Ox4, GCS 15 at arrival to ED. Pt is globally weak, pt reports that he is at his baseline. Legally blind in right eye, reduce movement in right arm due to unspecified pain for months. Generally sluggish in responses and movements at this time.   Pt initally hypotensive for EMS (94/60). 366ml NS administered via 18g LAC, Improved pressure to 102/72.   PTA EMS Vitals  BP 94/60 -> 102/70 HR 86 SPO2 98% RA RR 14 CBG 92

## 2022-05-07 NOTE — Discharge Instructions (Signed)
Follow up with your primary doctor. Stay well hydrated. Return to ED as needed.

## 2022-05-07 NOTE — ED Notes (Signed)
Gave patient bagged lunch and cup of water.

## 2022-05-09 DIAGNOSIS — R0602 Shortness of breath: Secondary | ICD-10-CM | POA: Diagnosis not present

## 2022-05-09 DIAGNOSIS — M5417 Radiculopathy, lumbosacral region: Secondary | ICD-10-CM | POA: Diagnosis not present

## 2022-05-09 DIAGNOSIS — R000 Tachycardia, unspecified: Secondary | ICD-10-CM | POA: Diagnosis not present

## 2022-05-09 DIAGNOSIS — Z23 Encounter for immunization: Secondary | ICD-10-CM | POA: Diagnosis not present

## 2022-05-09 DIAGNOSIS — E782 Mixed hyperlipidemia: Secondary | ICD-10-CM | POA: Diagnosis not present

## 2022-05-09 DIAGNOSIS — M5442 Lumbago with sciatica, left side: Secondary | ICD-10-CM | POA: Diagnosis not present

## 2022-05-09 DIAGNOSIS — J449 Chronic obstructive pulmonary disease, unspecified: Secondary | ICD-10-CM | POA: Diagnosis not present

## 2022-05-09 DIAGNOSIS — G8929 Other chronic pain: Secondary | ICD-10-CM | POA: Diagnosis not present

## 2022-05-12 ENCOUNTER — Telehealth: Payer: Self-pay

## 2022-05-12 NOTE — Telephone Encounter (Signed)
     Patient  visit on 9/17  at [facility]   Have you been able to follow up with your primary care physician? YES  The patient was or was not able to obtain any needed medicine or equipment.YES  Are there diet recommendations that you are having difficulty following?NA  Patient expresses understanding of discharge instructions and education provided has no other needs at this time. [place], [facility], Care Management  [phone] [address], [place], [place] [address] Phone: [phone] Email: [email or web address]

## 2022-06-06 DIAGNOSIS — M5442 Lumbago with sciatica, left side: Secondary | ICD-10-CM | POA: Diagnosis not present

## 2022-06-06 DIAGNOSIS — G8929 Other chronic pain: Secondary | ICD-10-CM | POA: Diagnosis not present

## 2022-06-06 DIAGNOSIS — Z0001 Encounter for general adult medical examination with abnormal findings: Secondary | ICD-10-CM | POA: Diagnosis not present

## 2022-06-06 DIAGNOSIS — J449 Chronic obstructive pulmonary disease, unspecified: Secondary | ICD-10-CM | POA: Diagnosis not present

## 2022-06-06 DIAGNOSIS — E782 Mixed hyperlipidemia: Secondary | ICD-10-CM | POA: Diagnosis not present

## 2022-08-27 ENCOUNTER — Encounter (HOSPITAL_COMMUNITY): Payer: Self-pay | Admitting: Pharmacy Technician

## 2022-08-27 ENCOUNTER — Emergency Department (HOSPITAL_COMMUNITY)
Admission: EM | Admit: 2022-08-27 | Discharge: 2022-08-27 | Disposition: A | Discharge status: Home / Self Care | Payer: 59 | Attending: Emergency Medicine | Admitting: Emergency Medicine

## 2022-08-27 ENCOUNTER — Emergency Department (HOSPITAL_COMMUNITY): Payer: 59

## 2022-08-27 ENCOUNTER — Other Ambulatory Visit: Payer: Self-pay

## 2022-08-27 DIAGNOSIS — J441 Chronic obstructive pulmonary disease with (acute) exacerbation: Secondary | ICD-10-CM | POA: Diagnosis not present

## 2022-08-27 DIAGNOSIS — R6889 Other general symptoms and signs: Secondary | ICD-10-CM | POA: Diagnosis not present

## 2022-08-27 DIAGNOSIS — J189 Pneumonia, unspecified organism: Secondary | ICD-10-CM | POA: Diagnosis not present

## 2022-08-27 DIAGNOSIS — R0602 Shortness of breath: Secondary | ICD-10-CM | POA: Diagnosis not present

## 2022-08-27 DIAGNOSIS — R197 Diarrhea, unspecified: Secondary | ICD-10-CM | POA: Insufficient documentation

## 2022-08-27 DIAGNOSIS — Z743 Need for continuous supervision: Secondary | ICD-10-CM | POA: Diagnosis not present

## 2022-08-27 DIAGNOSIS — J069 Acute upper respiratory infection, unspecified: Secondary | ICD-10-CM | POA: Insufficient documentation

## 2022-08-27 DIAGNOSIS — J439 Emphysema, unspecified: Secondary | ICD-10-CM | POA: Diagnosis not present

## 2022-08-27 DIAGNOSIS — Z20822 Contact with and (suspected) exposure to covid-19: Secondary | ICD-10-CM | POA: Insufficient documentation

## 2022-08-27 DIAGNOSIS — R062 Wheezing: Secondary | ICD-10-CM | POA: Diagnosis not present

## 2022-08-27 DIAGNOSIS — J9811 Atelectasis: Secondary | ICD-10-CM | POA: Diagnosis not present

## 2022-08-27 LAB — CBC WITH DIFFERENTIAL/PLATELET
Abs Immature Granulocytes: 0.03 10*3/uL (ref 0.00–0.07)
Basophils Absolute: 0.1 10*3/uL (ref 0.0–0.1)
Basophils Relative: 2 %
Eosinophils Absolute: 0 10*3/uL (ref 0.0–0.5)
Eosinophils Relative: 1 %
HCT: 33.9 % — ABNORMAL LOW (ref 39.0–52.0)
Hemoglobin: 12.2 g/dL — ABNORMAL LOW (ref 13.0–17.0)
Immature Granulocytes: 1 %
Lymphocytes Relative: 27 %
Lymphs Abs: 1.1 10*3/uL (ref 0.7–4.0)
MCH: 36 pg — ABNORMAL HIGH (ref 26.0–34.0)
MCHC: 36 g/dL (ref 30.0–36.0)
MCV: 100 fL (ref 80.0–100.0)
Monocytes Absolute: 0.3 10*3/uL (ref 0.1–1.0)
Monocytes Relative: 9 %
Neutro Abs: 2.5 10*3/uL (ref 1.7–7.7)
Neutrophils Relative %: 60 %
Platelets: 167 10*3/uL (ref 150–400)
RBC: 3.39 MIL/uL — ABNORMAL LOW (ref 4.22–5.81)
RDW: 15.3 % (ref 11.5–15.5)
WBC: 4 10*3/uL (ref 4.0–10.5)
nRBC: 0 % (ref 0.0–0.2)

## 2022-08-27 LAB — BASIC METABOLIC PANEL
Anion gap: 15 (ref 5–15)
BUN: <5 mg/dL — ABNORMAL LOW (ref 6–20)
CO2: 22 mmol/L (ref 22–32)
Calcium: 8.5 mg/dL — ABNORMAL LOW (ref 8.9–10.3)
Chloride: 95 mmol/L — ABNORMAL LOW (ref 98–111)
Creatinine, Ser: 0.53 mg/dL — ABNORMAL LOW (ref 0.61–1.24)
GFR, Estimated: >60 mL/min (ref >60)
Glucose, Bld: 97 mg/dL (ref 70–99)
Potassium: 3.5 mmol/L (ref 3.5–5.1)
Sodium: 132 mmol/L — ABNORMAL LOW (ref 135–145)

## 2022-08-27 LAB — RESP PANEL BY RT-PCR (RSV, FLU A&B, COVID)  RVPGX2
Influenza A by PCR: NEGATIVE
Influenza B by PCR: NEGATIVE
Resp Syncytial Virus by PCR: NEGATIVE
SARS Coronavirus 2 by RT PCR: NEGATIVE

## 2022-08-27 LAB — D-DIMER, QUANTITATIVE: D-Dimer, Quant: 3.34 ug/mL-FEU — ABNORMAL HIGH (ref 0.00–0.50)

## 2022-08-27 MED ORDER — DOXYCYCLINE HYCLATE 100 MG PO CAPS: 100.0000 mg | ORAL_CAPSULE | Freq: Two times a day (BID) | ORAL | 0 refills | Status: DC

## 2022-08-27 MED ORDER — PREDNISONE 20 MG PO TABS: ORAL_TABLET | ORAL | 0 refills | Status: DC

## 2022-08-27 MED ORDER — SODIUM CHLORIDE 0.9 % IV BOLUS
1000.0000 mL | Freq: Once | INTRAVENOUS | Status: AC
  Administered 2022-08-27: 1000 mL via INTRAVENOUS

## 2022-08-27 MED ORDER — ALBUTEROL SULFATE HFA 108 (90 BASE) MCG/ACT IN AERS: 2.0000 | INHALATION_SPRAY | RESPIRATORY_TRACT | 3 refills | Status: AC | PRN

## 2022-08-27 MED ORDER — IOHEXOL 350 MG/ML SOLN
75.0000 mL | Freq: Once | INTRAVENOUS | Status: AC | PRN
  Administered 2022-08-27: 75 mL via INTRAVENOUS

## 2022-08-27 MED ORDER — SYMBICORT 160-4.5 MCG/ACT IN AERO: INHALATION_SPRAY | RESPIRATORY_TRACT | 1 refills | Status: DC

## 2022-08-27 NOTE — Discharge Instructions (Addendum)
With your primary care doctor.  You should have a repeat chest x-ray after your symptoms have cleared.  Turn to the emergency room if you have any worsening symptoms.

## 2022-08-27 NOTE — ED Provider Notes (Addendum)
[facility] EMERGENCY DEPARTMENT Provider Note   CSN: [id number] Arrival date & time: 08/27/22  1042     History  No chief complaint on file.   [name] is a 57 y.o. male.  Patient is a 57 year old male who presents with shortness of breath.  He does have a history of COPD.  He uses an albuterol inhaler for this but no other medications that he reports.  I do see on chart review that he was previously prescribed Symbicort.  He says that he has had some increased shortness of breath over the last week.  He has had some increased wheezing.  He has had some sore throat and nasal congestion as well.  He has a little bit of discomfort in his throat and his left ribs with coughing.  No other chest pain.  He has had some posttussive emesis and some diarrhea as well.  No known fevers.  He was transported by EMS who provided him with nebulizer treatments and Solu-Medrol prior to arrival.  He states he feels much better now.       Home Medications Prior to Admission medications   Medication Sig Start Date End Date Taking? Authorizing Provider  doxycycline (VIBRAMYCIN) 100 MG capsule Take 1 capsule (100 mg total) by mouth 2 (two) times daily. One po bid x 7 days 08/27/22  Yes Rolan Bucco, MD  predniSONE (DELTASONE) 20 MG tablet 2 tabs po daily x 4 days 08/27/22  Yes Rolan Bucco, MD  albuterol (VENTOLIN HFA) 108 (90 Base) MCG/ACT inhaler Inhale 2 puffs into the lungs every 4 (four) hours as needed for wheezing or shortness of breath. 08/27/22   Rolan Bucco, MD  Aspirin-Caffeine (BC FAST PAIN RELIEF PO) Take 1 packet by mouth every 6 (six) hours as needed (for headaches).    [provider]  folic acid (FOLVITE) 1 MG tablet 1 tab(s)    [provider]  gabapentin (NEURONTIN) 300 MG capsule Take 300 mg by mouth 3 (three) times daily.  Patient not taking: Reported on 06/14/2021 02/11/19   [provider]  gabapentin (NEURONTIN) 300 MG capsule 1  cap(s)    [provider]  meloxicam (MOBIC) 7.5 MG tablet Take 7.5 mg by mouth daily. 01/09/19   [provider]  meloxicam (MOBIC) 7.5 MG tablet 1-2 tab(s)    [provider]  pantoprazole (PROTONIX) 40 MG tablet Take 1 tablet (40 mg total) by mouth daily. 06/02/19 06/01/20  Brahmbhatt, Darcus Austin, MD  PHENobarbital (LUMINAL) 32.4 MG tablet Take 1 tablet (32.4 mg total) by mouth 3 (three) times daily for 2 days, THEN 0.5 tablets (16.2 mg total) 3 (three) times daily for 2 days. 10/16/19 10/20/19  Almon Hercules, MD  SYMBICORT 160-4.5 MCG/ACT inhaler SMARTSIG:2 Puff(s) By Mouth Twice Daily 08/27/22   Rolan Bucco, MD  thiamine 100 MG tablet Take 1 tablet (100 mg total) by mouth daily. 10/16/19   Almon Hercules, MD  tiZANidine (ZANAFLEX) 2 MG tablet Take 1-2 tablets by mouth every 8 (eight) hours as needed for muscle spasms.  Patient not taking: Reported on 06/14/2021 01/08/19   [provider]  tiZANidine (ZANAFLEX) 2 MG tablet Take by mouth.    [provider]  Vitamin D3 (VITAMIN D) 25 MCG tablet 1 tab(s)    [provider]      Allergies    Patient has no known allergies.    Review of Systems   Review of Systems  Constitutional:  Positive  for fatigue. Negative for chills, diaphoresis and fever.  HENT:  Positive for congestion, rhinorrhea and sore throat. Negative for sneezing.   Eyes: Negative.   Respiratory:  Positive for cough, shortness of breath and wheezing. Negative for chest tightness.   Cardiovascular:  Positive for chest pain (Left rib pain only with coughing). Negative for leg swelling.  Gastrointestinal:  Positive for diarrhea and vomiting. Negative for abdominal pain, blood in stool and nausea.  Genitourinary:  Negative for difficulty urinating, flank pain, frequency and hematuria.  Musculoskeletal:  Negative for arthralgias and back pain.  Skin:  Negative for rash.  Neurological:  Negative for dizziness, speech difficulty, weakness,  numbness and headaches.    Physical Exam Updated Vital Signs BP 122/83 (BP Location: Left Arm)   Pulse (!) 116   Temp 98.3 F (36.8 C) (Oral)   Resp 16   SpO2 91%  Physical Exam Constitutional:      Appearance: He is well-developed.  HENT:     Head: Normocephalic and atraumatic.     Nose: Rhinorrhea present.     Mouth/Throat:     Pharynx: No oropharyngeal exudate or posterior oropharyngeal erythema.     Comments: Uvula is midline, no trismus Eyes:     Pupils: Pupils are equal, round, and reactive to light.  Cardiovascular:     Rate and Rhythm: Normal rate and regular rhythm.     Heart sounds: Normal heart sounds.  Pulmonary:     Effort: Pulmonary effort is normal. No respiratory distress.     Breath sounds: Wheezing (Scarce wheezing, no increased work of breathing) present. No rales.  Chest:     Chest wall: No tenderness.  Abdominal:     General: Bowel sounds are normal.     Palpations: Abdomen is soft.     Tenderness: There is no abdominal tenderness. There is no guarding or rebound.  Musculoskeletal:        General: Normal range of motion.     Cervical back: Normal range of motion and neck supple.     Comments: No edema or calf tenderness  Lymphadenopathy:     Cervical: No cervical adenopathy.  Skin:    General: Skin is warm and dry.     Findings: No rash.  Neurological:     Mental Status: He is alert and oriented to person, place, and time.     ED Results / Procedures / Treatments   Labs (all labs ordered are listed, but only abnormal results are displayed) Labs Reviewed  BASIC METABOLIC PANEL - Abnormal; Notable for the following components:      Result Value   Sodium 132 (*)    Chloride 95 (*)    BUN <5 (*)    Creatinine, Ser 0.53 (*)    Calcium 8.5 (*)    All other components within normal limits  CBC WITH DIFFERENTIAL/PLATELET - Abnormal; Notable for the following components:   RBC 3.39 (*)    Hemoglobin 12.2 (*)    HCT 33.9 (*)    MCH 36.0 (*)     All other components within normal limits  D-DIMER, QUANTITATIVE - Abnormal; Notable for the following components:   D-Dimer, Quant 3.34 (*)    All other components within normal limits  RESP PANEL BY RT-PCR (RSV, FLU A&B, COVID)  [id number]    EKG EKG Interpretation  Date/Time:  Sunday August 27 2022 10:53:14 EST Ventricular Rate:  103 PR Interval:  178 QRS Duration: 82 QT Interval:  344 QTC Calculation: 450  R Axis:   72 Text Interpretation: Sinus tachycardia Septal infarct , age undetermined Abnormal ECG When compared with ECG of 07-May-2022 18:52, PREVIOUS ECG IS PRESENT Confirmed by Rolan Bucco [phone]) on 08/27/2022 1:12:47 PM  Radiology CT Angio Chest PE W/Cm &/Or Wo Cm  Result Date: 08/27/2022 CLINICAL DATA:  Worsening shortness of breath over the last week. EXAM: CT ANGIOGRAPHY CHEST WITH CONTRAST TECHNIQUE: Multidetector CT imaging of the chest was performed using the standard protocol during bolus administration of intravenous contrast. Multiplanar CT image reconstructions and MIPs were obtained to evaluate the vascular anatomy. RADIATION DOSE REDUCTION: This exam was performed according to the departmental dose-optimization program which includes automated exposure control, adjustment of the mA and/or kV according to patient size and/or use of iterative reconstruction technique. CONTRAST:  40mL OMNIPAQUE IOHEXOL 350 MG/ML SOLN COMPARISON:  Same day chest radiograph and CT chest dated 10/11/2019. FINDINGS: Cardiovascular: Satisfactory opacification of the pulmonary arteries to the segmental level. No evidence of pulmonary embolism. Vascular calcifications are seen in the coronary arteries and aortic arch. Normal heart size. No pericardial effusion. Mediastinum/Nodes: No enlarged mediastinal, hilar, or axillary lymph nodes. Thyroid gland, trachea, and esophagus demonstrate no significant findings. Lungs/Pleura: Mild ground-glass opacities are seen in the medial aspect of the left upper  lobe (series 7, image 60 and series 9, image 126) moderate centrilobular emphysema is seen bilaterally. There is minimal bilateral dependent atelectasis. Chronic scar is seen at the base of the right middle lobe, unchanged. No pleural effusion or pneumothorax. Upper Abdomen: No acute abnormality. Musculoskeletal: Chronic left ninth through eleventh rib fractures are noted. Review of the MIP images confirms the above findings. IMPRESSION: 1. No evidence of pulmonary embolism. 2. Mild ground-glass opacities in the medial aspect of the left upper lobe may represent pneumonia, however imaging follow-up to resolution is recommended. Aortic Atherosclerosis (ICD10-I70.0) and Emphysema (ICD10-J43.9). Electronically Signed   By: Romona Curls M.D.   On: 08/27/2022 16:59   DG Chest 2 View  Result Date: 08/27/2022 CLINICAL DATA:  Shortness of breath EXAM: CHEST - 2 VIEW COMPARISON:  February 05, 2016 FINDINGS: The heart, hila, and mediastinum are normal. No pneumothorax. No nodule or mass. No suspicious infiltrate. Probable scarring or chronic atelectasis in the right upper lobe. No overt edema. IMPRESSION: Probable scarring or chronic atelectasis in the right upper lobe. No other acute abnormalities. Electronically Signed   By: Gerome Sam III M.D.   On: 08/27/2022 12:21    Procedures Procedures    Medications Ordered in ED Medications  sodium chloride 0.9 % bolus 1,000 mL (1,000 mLs Intravenous New Bag/Given 08/27/22 1628)  iohexol (OMNIPAQUE) 350 MG/ML injection 75 mL (75 mLs Intravenous Contrast Given 08/27/22 1647)    ED Course/ Medical Decision Making/ A&P                           Medical Decision Making Amount and/or Complexity of Data Reviewed Labs: ordered. Radiology: ordered.  Risk Prescription drug management.   Patient is a 57 year old male who presents with shortness of breath.  His symptoms sound consistent with a COPD exacerbation.  He has some left-sided chest pain but only when  coughing.  He denies any current chest pain.  No hypoxia.  No increased work of breathing.  He was treated with nebulizer treatments and Solu-Medrol by EMS.  He says he feels back to baseline after an observation period in the ED.  Chest x-ray two-view was interpreted by me and  confirmed by the radiologist that showed no evidence of pneumonia.  No pulmonary edema.  No other acute abnormalities.  He does not have other symptoms that sound more concerning for PE.  No leg swelling or other concerns for fluid overload/CHF.  No symptoms that sound more concerning for ACS.  No ischemic changes noted on EKG.  His COVID/flu test is negative.  His other blood work is nonconcerning.  He feels back to baseline currently.  He has a little bit of shakiness which he has had since he got the nebulizer treatments.  This is likely from the albuterol.  He does say that he was on Symbicort but ran out of it.  Will represcribe the Symbicort.  Start a prednisone burst.  Given the refill on his albuterol inhaler.  He was encouraged to have close follow-up with his primary care doctor.  Return precautions were given.  On discharge, patient's heart rate had gone up.  He seemed tremulous which I initially had thought was from the albuterol.  However given his tachycardia, was observed for longer period of time.  D-dimer was ordered.  This was positive.  CTA was performed of his chest.  There is no evidence of PE.  There is a question of left upper lobe pneumonia.  Will start antibiotics.  I advised him he will have to have repeat imaging to assure resolution.  Advised that he can follow-up with his primary care doctor regarding this.  He is feeling much better.  There is no tremors.  His heart rate has improved on my exam.  He denies any current shortness of breath.  No hypoxia.  This point he appears stable for discharge.  Final Clinical Impression(s) / ED Diagnoses Final diagnoses:  COPD exacerbation (HCC)  Upper respiratory tract  infection, unspecified type  Community acquired pneumonia, unspecified laterality    Rx / DC Orders ED Discharge Orders          Ordered    albuterol (VENTOLIN HFA) 108 (90 Base) MCG/ACT inhaler  Every 4 hours PRN        08/27/22 1426    predniSONE (DELTASONE) 20 MG tablet        08/27/22 1426    SYMBICORT 160-4.5 MCG/ACT inhaler        08/27/22 1426    doxycycline (VIBRAMYCIN) 100 MG capsule  2 times daily        08/27/22 1708              Rolan Bucco, MD 08/27/22 1433    Rolan Bucco, MD 08/27/22 [phone]

## 2022-08-27 NOTE — ED Provider Triage Note (Signed)
Emergency Medicine Provider Triage Evaluation Note  [name] , a 57 y.o. male  was evaluated in triage.  Pt complains of worsening shortness of breath of the last week.  Hx of COPD and asthma.  Transported by EMS, received breathing treatments and Solu-Medrol en route.  Notes some improvement.  Denies fevers, neck stiffness, chest pain, or chest discomfort.  Review of Systems  Positive:  Negative: See above  Physical Exam  BP (!) 142/80   Pulse 97   Temp 98.5 F (36.9 C) (Oral)   Resp 20   SpO2 94%  Gen:   Awake, no distress   Resp:  Normal effort, communicating without difficulty MSK:   Moves extremities without difficulty  Other:  Equal chest rise.  Chest non-TTP.  Sitting comfortably.  Medical Decision Making  Medically screening exam initiated at 11:18 AM.  Appropriate orders placed.  [name] was informed that the remainder of the evaluation will be completed by another provider, this initial triage assessment does not replace that evaluation, and the importance of remaining in the ED until their evaluation is complete.     Danny Rome, PA-C 96/22/29 1125

## 2022-08-27 NOTE — ED Triage Notes (Signed)
Pt bib ems with reports of shob. Hx asthma and COPD. RR 30 labored initially with wheezing in all fields. 5mg  albuterol and 0.5mg  atrovent given en route. 125mg  solumedrol given as well.  Pt appears to be in NAD on arrival,  RR 142/92 HR 90 98% RA

## 2022-08-27 NOTE — ED Notes (Signed)
Dr. Tamera Punt notified of tachycardia.  We are gonna watch his HR for a while before DC

## 2022-08-31 ENCOUNTER — Telehealth: Payer: Self-pay

## 2022-08-31 NOTE — Telephone Encounter (Signed)
     Patient  visit on 1/7  at [facility]   Have you been able to follow up with your primary care physician? Yes next week  The patient was or was not able to obtain any needed medicine or equipment. Yes   Are there diet recommendations that you are having difficulty following? Na   Patient expresses understanding of discharge instructions and education provided has no other needs at this time.  Yes      [place], [facility], Care Management  [phone] [address], [place], [place] [address] Phone: [phone] Email: [email or web address]

## 2022-09-05 ENCOUNTER — Telehealth: Payer: Self-pay

## 2022-09-05 DIAGNOSIS — Z139 Encounter for screening, unspecified: Secondary | ICD-10-CM

## 2022-09-05 NOTE — Patient Outreach (Signed)
  Care Coordination   Initial Visit Note   09/05/2022 Name: [name] MRN: [id number] DOB: 07/14/1966  [name] is a 57 y.o. year old male who sees [name], Danny Beard, MD for primary care. I spoke with  [name] by phone today.  What matters to the patients health and wellness today?  [name] reports he recently went to ED with pneumonia. He reports he is feeling better. Denies any health concerns at this time. Requesting food resources at this time. Denies any other needs.    Goals Addressed             This Visit's Progress    Care Coordination Activities       Care Coordination Interventions: Discussed the care coordination program Care Guide referral for regarding food insecurity Discussed Health Navigator, transportation benefit and OTC benefit with insurance provider and encouraged patient to contact health navigator for more information regarding resources available through his health plan Provided RNCM contact number and encouraged to call if care coordination questions prior to next outreach       SDOH assessments and interventions completed:  Yes  SDOH Interventions Today    Flowsheet Row Most Recent Value  SDOH Interventions   Food Insecurity Interventions Other (Comment)  [referral to care guide]  Housing Interventions Intervention Not Indicated  Transportation Interventions Intervention Not Indicated  Utilities Interventions Intervention Not Indicated     Care Coordination Interventions:  Yes, provided   Follow up plan: Follow up call scheduled for 09/21/22    Encounter Outcome:  Pt. Visit Completed   Danny Silversmith, RN, MSN, BSN, [place] Coordinator [phone]

## 2022-09-06 ENCOUNTER — Telehealth: Payer: Self-pay

## 2022-09-06 DIAGNOSIS — J449 Chronic obstructive pulmonary disease, unspecified: Secondary | ICD-10-CM | POA: Diagnosis not present

## 2022-09-06 DIAGNOSIS — E782 Mixed hyperlipidemia: Secondary | ICD-10-CM | POA: Diagnosis not present

## 2022-09-06 NOTE — Telephone Encounter (Signed)
   Telephone encounter was:  Successful.  09/06/2022 Name: [name] MRN: [id number] DOB: 01-24-1966  [name] is a 57 y.o. year old male who is a primary care patient of Osei-Bonsu, Iona Beard, MD . The community resource team was consulted for assistance with [place] guide performed the following interventions: Patient provided with information about care guide support team and interviewed to confirm resource needs.Patient stated he needs food resources. Patient cant afford to buy food for the whole month after paying bills. Patient runs out of food around the 20th of the month with no money to buy more. I added a referral in [place] [facility] and mailed resources to the patient as requested  Follow Up Plan:  Care guide will follow up with patient by phone over the next day    [place], [facility]  [phone] [address], [place], [place] [address] Phone: [phone] Email: [email or web address]

## 2022-09-20 DIAGNOSIS — G8929 Other chronic pain: Secondary | ICD-10-CM | POA: Diagnosis not present

## 2022-09-20 DIAGNOSIS — J449 Chronic obstructive pulmonary disease, unspecified: Secondary | ICD-10-CM | POA: Diagnosis not present

## 2022-09-21 ENCOUNTER — Ambulatory Visit: Payer: Self-pay

## 2022-09-21 NOTE — Patient Instructions (Addendum)
Visit Information  Thank you for taking time to visit with me today. Please don't hesitate to contact me if I can be of assistance to you.   Following are the goals we discussed today:   Goals Addressed             This Visit's Progress    COMPLETED: Care Coordination Activities       Care Coordination Interventions: Confirmed patient has received food resources from the care guide Discussed alcohol cessation and discussed social work referral. Mr. Lachman declines at this time. Discussed possibility of obtaining a blood pressure monitor so that he can routinely check his blood pressure through OTC benefit. RNCM encouraged patient to discuss with navigator if it is possible to obtain a mobile phone with his benefits-Patient reports he does not want a cell phone. Discussed importance of knowing his health insurance benefits, including OTC benefits and who to contact. Mr. Cumpston reports he has been in contact with nurse from his insurance company and she is assisting with getting him a hospital bed approved. RNCM encouraged Mr. Mcphie to find out other available benefits from the nurse, to make sure he has her name and contact number as a resource for any additional needs. RNCM provided contact number and encouraged to call if care coordination needs in the future.      If you are experiencing a Mental Health or [place] or need someone to talk to, please call the Suicide and Crisis Lifeline: [place], RN, MSN, BSN, [place] [phone]

## 2022-09-21 NOTE — Patient Outreach (Signed)
  Care Coordination   Follow Up Visit Note   09/21/2022 Name: [name] MRN: [id number] DOB: March 10, 1966  [name] is a 57 y.o. year old male who sees Osei-Bonsu, Iona Beard, MD for primary care. I spoke with  Iris Pert by phone today.  What matters to the patients health and wellness today?  Mr. Maura reports he has been in contact with the nurse from his insurance company. He acknowledges that he drinks beer, but is not interested in alcohol cessation or referral to social work. He denies any concerns or issues at this time.     Goals Addressed             This Visit's Progress    COMPLETED: Care Coordination Activities       Care Coordination Interventions: Confirmed patient has received food resources from the care guide Discussed alcohol cessation and discussed social work referral. Mr. Nettle declines at this time. Discussed possibility of obtaining a blood pressure monitor so that he can routinely check his blood pressure through OTC benefit. RNCM encouraged patient to discuss with navigator if it is possible to obtain a mobile phone with his benefits-Patient reports he does not want a cell phone. Discussed importance of knowing his health insurance benefits, including OTC benefits and who to contact. Mr. Killgore reports he has been in contact with nurse from his insurance company and she is assisting with getting him a hospital bed approved. RNCM encouraged Mr. Volk to find out other available benefits from the nurse, to make sure he has her name and contact number as a resource for any additional needs. RNCM provided contact number and encouraged to call if care coordination needs in the future.      SDOH assessments and interventions completed:  No  Care Coordination Interventions:  Yes, provided   Follow up plan: No further intervention required.   Encounter Outcome:  Pt. Visit Completed   Thea Silversmith, RN, MSN, BSN, [place]  Coordinator [phone]

## 2022-10-16 ENCOUNTER — Ambulatory Visit: Payer: 59 | Attending: Internal Medicine

## 2022-10-16 DIAGNOSIS — M5459 Other low back pain: Secondary | ICD-10-CM | POA: Insufficient documentation

## 2022-10-16 DIAGNOSIS — R2689 Other abnormalities of gait and mobility: Secondary | ICD-10-CM | POA: Insufficient documentation

## 2022-10-16 DIAGNOSIS — M6281 Muscle weakness (generalized): Secondary | ICD-10-CM | POA: Insufficient documentation

## 2022-10-16 NOTE — Therapy (Signed)
OUTPATIENT PHYSICAL THERAPY THORACOLUMBAR EVALUATION   Patient Name: [name] MRN: [id number] DOB:[birth year], 57 y.o., male Today's Date: 10/16/2022  END OF SESSION:  PT End of Session - 10/16/22 1218     Visit Number 1    Number of Visits 17    Date for PT Re-Evaluation 12/11/22    Authorization Type [employer] MCR    PT Start Time [id number]    PT Stop Time 1208    PT Time Calculation (min) 45 min    Activity Tolerance Patient tolerated treatment well    Behavior During Therapy WFL for tasks assessed/performed             Past Medical History:  Diagnosis Date   Alcohol abuse    Allergy    Anxiety    Arthritis    Asthma    COPD (chronic obstructive pulmonary disease) ([place])    Depression    Hypertension    Shortness of breath    Past Surgical History:  Procedure Laterality Date   BACK SURGERY     BIOPSY  06/02/2019   Procedure: BIOPSY;  Surgeon: Otis Brace, MD;  Location: [facility];  Service: Gastroenterology;;   COLONOSCOPY WITH PROPOFOL N/A 06/02/2019   Procedure: COLONOSCOPY WITH PROPOFOL;  Surgeon: Otis Brace, MD;  Location: [facility];  Service: Gastroenterology;  Laterality: N/A;   ESOPHAGOGASTRODUODENOSCOPY (EGD) WITH PROPOFOL N/A 06/02/2019   Procedure: ESOPHAGOGASTRODUODENOSCOPY (EGD) WITH PROPOFOL;  Surgeon: Otis Brace, MD;  Location: [facility];  Service: Gastroenterology;  Laterality: N/A;   HEMORRHOID SURGERY     HERNIA REPAIR     MANDIBLE FRACTURE SURGERY     POLYPECTOMY  06/02/2019   Procedure: POLYPECTOMY;  Surgeon: Otis Brace, MD;  Location: [facility];  Service: Gastroenterology;;   Patient Active Problem List   Diagnosis Date Noted   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   Elevated transaminase level 06/07/2021   Macrocytic anemia 06/07/2021   Mixed hyperlipidemia 06/07/2021   Neuropathy 06/07/2021   Shortness of breath 06/07/2021   Ventral hernia without obstruction or gangrene 06/07/2021    Vitamin B deficiency 06/07/2021   Vitamin D deficiency 06/07/2021   S/P repair of ventral hernia 04/29/2020   Malnutrition of moderate degree 10/14/2019   Alcohol withdrawal with inpatient treatment ([place]) 10/12/2019   COPD exacerbation ([place]) 10/11/2019   Tobacco abuse 10/11/2019   Respiratory failure with hypoxia ([place]) 10/11/2019   Leukopenia 10/11/2019   Thrombocytopenia ([place]) 10/11/2019   Rectal bleeding 10/11/2019   Preoperative clearance 08/31/2017   COPD (chronic obstructive pulmonary disease) ([place]) 11/17/2015   Alcohol dependence with uncomplicated withdrawal ([place]) 06/29/2014   S/P alcohol detoxification 01/20/2014   Pain of right thigh 123456   Salicylate overdose 123456   Alcohol abuse, continuous 09/24/2012    Class: Acute   Alcohol dependence ([place]) 09/24/2012   Alcohol withdrawal ([place]) 09/24/2012    PCP: Benito Mccreedy, MD   REFERRING PROVIDER: Benito Mccreedy, MD   REFERRING DIAG: lower back pain  Rationale for Evaluation and Treatment: Rehabilitation  THERAPY DIAG:  Other low back pain - Plan: PT plan of care cert/re-cert  Muscle weakness (generalized) - Plan: PT plan of care cert/re-cert  Other abnormalities of gait and mobility - Plan: PT plan of care cert/re-cert  ONSET DATE: Chronic  SUBJECTIVE:  SUBJECTIVE STATEMENT: Pt presents to PT with reports of chronic lower back pain that refers into L LE. Pt denies MOI, notes pain has been gradually increasing over last year. Feels very unsteady, especially when walking or standing for long periods of time as his L LE will "give out" and he will fall to that side. Denies bowel/bladder changes or saddle anesthesia. Notes that he would like to get a hospital bed as he has a lot of trouble getting in/out of bed secondary  to back pain.   PERTINENT HISTORY:  COPD, HTN  PAIN:  Are you having pain?  Yes: NPRS scale: 8/10 Pain location: lower back; L LE  Pain description: sharp Aggravating factors: prolonged standing, walking Relieving factors: medication, rest  PRECAUTIONS: Fall  WEIGHT BEARING RESTRICTIONS: No  FALLS:  Has patient fallen in last 6 months? Yes. Number of falls: 6; gets off balance and left leg gives out  LIVING ENVIRONMENT: Lives with: lives with their family Lives in: House/apartment Stairs: No Has following equipment at home: Single point cane and Environmental consultant - 2 wheeled  OCCUPATION: On disability  PLOF: Independent  PATIENT GOALS: Pt would to decrease pain in lower back to move around a little bit better  OBJECTIVE:   DIAGNOSTIC FINDINGS:  See imaging   VITALS:  O2: 94-96% during evaluation  PATIENT SURVEYS:  Modified Oswestry: 34/50   COGNITION: Overall cognitive status: Within functional limits for tasks assessed    SENSATION: WFL  MUSCLE LENGTH: Thomas test: Right (-); Left (+)  POSTURE: rounded shoulders, forward head, and increased lumbar lordosis  PALPATION: TTP to lumbar paraspinals, L gluteals  LUMBAR ROM:   AROM eval  Flexion WNL  Extension Reduced; no pain  Right lateral flexion   Left lateral flexion   Right rotation   Left rotation    (Blank rows = not tested)  LOWER EXTREMITY MMT:    MMT Right eval Left eval  Hip flexion 3+/5 3+/5  Hip extension    Hip abduction 3/5 3/5  Hip adduction 4/5 4/5  Hip internal rotation    Hip external rotation    Knee flexion 4/5 4/5  Knee extension 4/5 4/5  Ankle dorsiflexion    Ankle plantarflexion    Ankle inversion    Ankle eversion     (Blank rows = not tested)  LUMBAR SPECIAL TESTS:  Straight leg raise test: Positive and Slump test: Positive  FUNCTIONAL TESTS:  30 Second Sit to Stand: 3 reps  GAIT: Distance walked: 64f Assistive device utilized: Single point cane Level of  assistance: Modified independence Comments: trunk flexed, decreased gait speed  TREATMENT: [facility] Adult PT Treatment:                                                DATE: 10/16/2022 Therapeutic Exercise: Modified thomas stretch x 60" Seated clamshell RTB x 15 Seated hamstring stretch x 30" each  PATIENT EDUCATION:  Education details: eval findings, FOTO, HEP, POC Person educated: Patient Education method: Explanation, Demonstration, and Handouts Education comprehension: verbalized understanding and returned demonstration  HOME EXERCISE PROGRAM: Access Code: [email or web address]: [email or web address] Date: 10/16/2022 Prepared by: DOctavio Manns Exercises - Modified TMarcello MooresStretch  - 1-2 x daily - 7 x weekly - 2 reps - 60 sec hold - Seated Hip Abduction with Resistance  - 1 x daily - 7 x weekly -  2-3 sets - 15 reps - red band hold - Seated Hamstring Stretch  - 1-2 x daily - 7 x weekly - 2 reps - 30 sec hold  ASSESSMENT:  CLINICAL IMPRESSION: Patient is a 57 y.o. M who was seen today for physical therapy evaluation and treatment for chronic LBP and discomfort. Physical findings are consistent with physician impression as pt demonstrates decrease in core and proximal hip strength corresponding to reduced functional mobility. His FOTO score indicates he is operating below PLOF demonstrating need for skilled therapy services. PT will emphasis core and proximal hip strengthening to reduce LBP and increase function.  OBJECTIVE IMPAIRMENTS: Abnormal gait, decreased activity tolerance, decreased balance, decreased mobility, difficulty walking, decreased ROM, decreased strength, and pain.   ACTIVITY LIMITATIONS: standing, squatting, sleeping, stairs, transfers, and locomotion level  PARTICIPATION LIMITATIONS: meal prep, cleaning, driving, shopping, community activity, and occupation  PERSONAL FACTORS: Fitness, Time since onset of injury/illness/exacerbation, and 1-2 comorbidities: COPD,  HTN  are also affecting patient's functional outcome.   REHAB POTENTIAL: Good  CLINICAL DECISION MAKING: Evolving/moderate complexity  EVALUATION COMPLEXITY: Moderate   GOALS: Goals reviewed with patient? No  SHORT TERM GOALS: Target date: 11/06/2022   Pt will be compliant and knowledgeable with initial HEP for improved comfort and carryover Baseline: initial HEP given  Goal status: INITIAL  2.  Pt will self report lower back pain no greater than 6/10 for improved comfort and functional ability Baseline: 10/10 at worst Goal status: INITIAL   LONG TERM GOALS: Target date: 12/11/2022   Pt will be decrease ODI disability score to no greater than 54% as proxy for functional improvement Baseline: 68% disability  Goal status: INITIAL   2.  Pt will self report lower back pain no greater than 3/10 for improved comfort and functional ability Baseline: 10/10 at worst Goal status: INITIAL   3.  Pt will increase 30 Second Sit to Stand rep count to no less than 6 reps for improved balance, strength, and functional mobility Baseline: 3 reps (MCID 2)  Goal status: INITIAL   4.  Pt will improve bilateral hip MMT to no less than 4/5 for improved comfort and functional mobility Baseline: see chart Goal status: INITIAL  PLAN:  PT FREQUENCY: 1-2x/week  PT DURATION: 8 weeks  PLANNED INTERVENTIONS: Therapeutic exercises, Therapeutic activity, Neuromuscular re-education, Balance training, Gait training, Patient/Family education, Self Care, Joint mobilization, Aquatic Therapy, Dry Needling, Electrical stimulation, Cryotherapy, Moist heat, Vasopneumatic device, Manual therapy, and Re-evaluation.  PLAN FOR NEXT SESSION: assess HEP response, core and hip strengthening, hip flexor stretching   Ward Chatters, PT 10/16/2022, 12:28 PM

## 2022-10-19 DIAGNOSIS — J449 Chronic obstructive pulmonary disease, unspecified: Secondary | ICD-10-CM | POA: Diagnosis not present

## 2022-10-19 DIAGNOSIS — G8929 Other chronic pain: Secondary | ICD-10-CM | POA: Diagnosis not present

## 2022-10-25 ENCOUNTER — Ambulatory Visit: Payer: 59 | Attending: Internal Medicine

## 2022-10-25 DIAGNOSIS — R2689 Other abnormalities of gait and mobility: Secondary | ICD-10-CM | POA: Insufficient documentation

## 2022-10-25 DIAGNOSIS — M6281 Muscle weakness (generalized): Secondary | ICD-10-CM | POA: Insufficient documentation

## 2022-10-25 DIAGNOSIS — M5459 Other low back pain: Secondary | ICD-10-CM | POA: Diagnosis not present

## 2022-10-25 NOTE — Therapy (Signed)
OUTPATIENT PHYSICAL THERAPY TREATMENT NOTE   Patient Name: EPIMENIO MITTAG MRN: [id number] DOB:01-09-1966, 57 y.o., male Today's Date: 10/25/2022  PCP: Benito Mccreedy, MD   REFERRING PROVIDER: Benito Mccreedy, MD    END OF SESSION:   PT End of Session - 10/25/22 1117     Visit Number 2    Number of Visits 17    Date for PT Re-Evaluation 12/11/22    Authorization Type UHC MCR    PT Start Time 1130    PT Stop Time 1208    PT Time Calculation (min) 38 min    Activity Tolerance Patient tolerated treatment well    Behavior During Therapy WFL for tasks assessed/performed             Past Medical History:  Diagnosis Date   Alcohol abuse    Allergy    Anxiety    Arthritis    Asthma    COPD (chronic obstructive pulmonary disease) ([place])    Depression    Hypertension    Shortness of breath    Past Surgical History:  Procedure Laterality Date   BACK SURGERY     BIOPSY  06/02/2019   Procedure: BIOPSY;  Surgeon: Otis Brace, MD;  [facility];  Service: Gastroenterology;;   COLONOSCOPY WITH PROPOFOL N/A 06/02/2019   Procedure: COLONOSCOPY WITH PROPOFOL;  Surgeon: Otis Brace, MD;  [facility];  Service: Gastroenterology;  Laterality: N/A;   ESOPHAGOGASTRODUODENOSCOPY (EGD) WITH PROPOFOL N/A 06/02/2019   Procedure: ESOPHAGOGASTRODUODENOSCOPY (EGD) WITH PROPOFOL;  Surgeon: Otis Brace, MD;  [facility];  Service: Gastroenterology;  Laterality: N/A;   HEMORRHOID SURGERY     HERNIA REPAIR     MANDIBLE FRACTURE SURGERY     POLYPECTOMY  06/02/2019   Procedure: POLYPECTOMY;  Surgeon: Otis Brace, MD;  [facility];  Service: Gastroenterology;;   Patient Active Problem List   Diagnosis Date Noted   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   Elevated transaminase level 06/07/2021   Macrocytic anemia 06/07/2021   Mixed hyperlipidemia 06/07/2021   Neuropathy 06/07/2021   Shortness of breath  06/07/2021   Ventral hernia without obstruction or gangrene 06/07/2021   Vitamin B deficiency 06/07/2021   Vitamin D deficiency 06/07/2021   S/P repair of ventral hernia 04/29/2020   Malnutrition of moderate degree 10/14/2019   Alcohol withdrawal with inpatient treatment ([place]) 10/12/2019   COPD exacerbation ([place]) 10/11/2019   Tobacco abuse 10/11/2019   Respiratory failure with hypoxia ([place]) 10/11/2019   Leukopenia 10/11/2019   Thrombocytopenia ([place]) 10/11/2019   Rectal bleeding 10/11/2019   Preoperative clearance 08/31/2017   COPD (chronic obstructive pulmonary disease) ([place]) 11/17/2015   Alcohol dependence with uncomplicated withdrawal ([place]) 06/29/2014   S/P alcohol detoxification 01/20/2014   Pain of right thigh 123456   Salicylate overdose 123456   Alcohol abuse, continuous 09/24/2012    Class: Acute   Alcohol dependence ([place]) 09/24/2012   Alcohol withdrawal ([place]) 09/24/2012    REFERRING DIAG: lower back pain   THERAPY DIAG:  Other low back pain  Muscle weakness (generalized)  Other abnormalities of gait and mobility  Rationale for Evaluation and Treatment Rehabilitation  PERTINENT HISTORY: COPD, HTN   PRECAUTIONS: Fall  SUBJECTIVE:  SUBJECTIVE STATEMENT:  Patient reports continued pain, HEP compliance.    PAIN:  Are you having pain?  Yes: NPRS scale: 6/10 Pain location: lower back; L LE  Pain description: sharp Aggravating factors: prolonged standing, walking Relieving factors: medication, rest   OBJECTIVE: (objective measures completed at initial evaluation unless otherwise dated)   DIAGNOSTIC FINDINGS:  See imaging    VITALS:  O2: 94-96% during evaluation   PATIENT SURVEYS:  Modified Oswestry: 34/50    COGNITION: Overall cognitive status: Within functional  limits for tasks assessed                          SENSATION: WFL   MUSCLE LENGTH: Thomas test: Right (-); Left (+)   POSTURE: rounded shoulders, forward head, and increased lumbar lordosis   PALPATION: TTP to lumbar paraspinals, L gluteals   LUMBAR ROM:    AROM eval  Flexion WNL  Extension Reduced; no pain  Right lateral flexion    Left lateral flexion    Right rotation    Left rotation     (Blank rows = not tested)   LOWER EXTREMITY MMT:     MMT Right eval Left eval  Hip flexion 3+/5 3+/5  Hip extension      Hip abduction 3/5 3/5  Hip adduction 4/5 4/5  Hip internal rotation      Hip external rotation      Knee flexion 4/5 4/5  Knee extension 4/5 4/5  Ankle dorsiflexion      Ankle plantarflexion      Ankle inversion      Ankle eversion       (Blank rows = not tested)   LUMBAR SPECIAL TESTS:  Straight leg raise test: Positive and Slump test: Positive   FUNCTIONAL TESTS:  30 Second Sit to Stand: 3 reps   GAIT: Distance walked: 81f Assistive device utilized: Single point cane Level of assistance: Modified independence Comments: trunk flexed, decreased gait speed   [facility]                                                [facility]: [facility]22 Therapeutic Exercise: Nustep level 5 x 6 mins Standing hip abduction/extension 2x10 BIL Seated hamstring stretch 2x30" BIL Supine clamshell GTB 2x10 Supine marching GTB 2x30" [place] x10 Modified thomas stretch EOM x1' BIL LTR x10 BIL  [facility] Adult PT Treatment:                                                DATE: 10/16/2022 Therapeutic Exercise: Modified thomas stretch x 60" Seated clamshell RTB x 15 Seated hamstring stretch x 30" each   PATIENT EDUCATION:  Education details: eval findings, FOTO, HEP, POC Person educated: Patient Education method: Explanation, Demonstration, and Handouts Education comprehension: verbalized understanding and returned demonstration   HOME EXERCISE  PROGRAM: Access Code: [email or web address]: [email or web address] Date: 10/16/2022 Prepared by: DOctavio Manns  Exercises - Modified TMarcello MooresStretch  - 1-2 x daily - 7 x weekly - 2 reps - 60 sec hold - Seated Hip Abduction with Resistance  - 1 x daily - 7 x weekly - 2-3 sets - 15 reps - red band hold -  Seated Hamstring Stretch  - 1-2 x daily - 7 x weekly - 2 reps - 30 sec hold   ASSESSMENT:   CLINICAL IMPRESSION: Patient presents to PT with continued lower back pain and reports HEP compliance. Session today focused on proximal hip and core strengthening as well as stretching for hamstrings and hip flexors. He requires cues throughout session for upright posture as he tends to remain in a forward flexed position due to tight hip flexors. Patient was able to tolerate all prescribed exercises with no adverse effects. Patient continues to benefit from skilled PT services and should be progressed as able to improve functional independence.     OBJECTIVE IMPAIRMENTS: Abnormal gait, decreased activity tolerance, decreased balance, decreased mobility, difficulty walking, decreased ROM, decreased strength, and pain.    ACTIVITY LIMITATIONS: standing, squatting, sleeping, stairs, transfers, and locomotion level   PARTICIPATION LIMITATIONS: meal prep, cleaning, driving, shopping, community activity, and occupation   PERSONAL FACTORS: Fitness, Time since onset of injury/illness/exacerbation, and 1-2 comorbidities: COPD, HTN  are also affecting patient's functional outcome.    REHAB POTENTIAL: Good   CLINICAL DECISION MAKING: Evolving/moderate complexity   EVALUATION COMPLEXITY: Moderate     GOALS: Goals reviewed with patient? No   SHORT TERM GOALS: Target date: 11/06/2022   Pt will be compliant and knowledgeable with initial HEP for improved comfort and carryover Baseline: initial HEP given  Goal status: INITIAL   2.  Pt will self report lower back pain no greater than 6/10 for  improved comfort and functional ability Baseline: 10/10 at worst Goal status: INITIAL    LONG TERM GOALS: Target date: 12/11/2022   Pt will be decrease ODI disability score to no greater than 54% as proxy for functional improvement Baseline: 68% disability  Goal status: INITIAL    2.  Pt will self report lower back pain no greater than 3/10 for improved comfort and functional ability Baseline: 10/10 at worst Goal status: INITIAL    3.  Pt will increase 30 Second Sit to Stand rep count to no less than 6 reps for improved balance, strength, and functional mobility Baseline: 3 reps (MCID 2)  Goal status: INITIAL    4.  Pt will improve bilateral hip MMT to no less than 4/5 for improved comfort and functional mobility Baseline: see chart Goal status: INITIAL   PLAN:   PT FREQUENCY: 1-2x/week   PT DURATION: 8 weeks   PLANNED INTERVENTIONS: Therapeutic exercises, Therapeutic activity, Neuromuscular re-education, Balance training, Gait training, Patient/Family education, Self Care, Joint mobilization, Aquatic Therapy, Dry Needling, Electrical stimulation, Cryotherapy, Moist heat, Vasopneumatic device, Manual therapy, and Re-evaluation.   PLAN FOR NEXT SESSION: assess HEP response, core and hip strengthening, hip flexor stretching   Margarette Canada, PTA 10/25/2022, 12:07 PM

## 2022-10-26 NOTE — Therapy (Signed)
OUTPATIENT PHYSICAL THERAPY TREATMENT NOTE   Patient Name: [name] MRN: [id number] DOB:November 23, 1965, 57 y.o., male Today's Date: 10/27/2022  PCP: Benito Mccreedy, MD   REFERRING PROVIDER: Benito Mccreedy, MD    END OF SESSION:   PT End of Session - 10/27/22 0911     Visit Number 3    Number of Visits 17    Date for PT Re-Evaluation 12/11/22    Authorization Type U[employer] MCR    PT Start Time 0915    PT Stop Time 0955    PT Time Calculation (min) 40 min    Activity Tolerance Patient tolerated treatment well    Behavior During Therapy WFL for tasks assessed/performed             Past Medical History:  Diagnosis Date   Alcohol abuse    Allergy    Anxiety    Arthritis    Asthma    COPD (chronic obstructive pulmonary disease) ([place])    Depression    Hypertension    Shortness of breath    Past Surgical History:  Procedure Laterality Date   BACK SURGERY     BIOPSY  06/02/2019   Procedure: BIOPSY;  Surgeon: Otis Brace, MD;  Location: [facility];  Service: Gastroenterology;;   COLONOSCOPY WITH PROPOFOL N/A 06/02/2019   Procedure: COLONOSCOPY WITH PROPOFOL;  Surgeon: Otis Brace, MD;  Location: [facility];  Service: Gastroenterology;  Laterality: N/A;   ESOPHAGOGASTRODUODENOSCOPY (EGD) WITH PROPOFOL N/A 06/02/2019   Procedure: ESOPHAGOGASTRODUODENOSCOPY (EGD) WITH PROPOFOL;  Surgeon: Otis Brace, MD;  Location: [facility];  Service: Gastroenterology;  Laterality: N/A;   HEMORRHOID SURGERY     HERNIA REPAIR     MANDIBLE FRACTURE SURGERY     POLYPECTOMY  06/02/2019   Procedure: POLYPECTOMY;  Surgeon: Otis Brace, MD;  Location: [facility];  Service: Gastroenterology;;   Patient Active Problem List   Diagnosis Date Noted   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   Elevated transaminase level 06/07/2021   Macrocytic anemia 06/07/2021   Mixed hyperlipidemia 06/07/2021   Neuropathy 06/07/2021   Shortness of breath  06/07/2021   Ventral hernia without obstruction or gangrene 06/07/2021   Vitamin B deficiency 06/07/2021   Vitamin D deficiency 06/07/2021   S/P repair of ventral hernia 04/29/2020   Malnutrition of moderate degree 10/14/2019   Alcohol withdrawal with inpatient treatment ([place]) 10/12/2019   COPD exacerbation ([place]) 10/11/2019   Tobacco abuse 10/11/2019   Respiratory failure with hypoxia ([place]) 10/11/2019   Leukopenia 10/11/2019   Thrombocytopenia ([place]) 10/11/2019   Rectal bleeding 10/11/2019   Preoperative clearance 08/31/2017   COPD (chronic obstructive pulmonary disease) ([place]) 11/17/2015   Alcohol dependence with uncomplicated withdrawal ([place]) 06/29/2014   S/P alcohol detoxification 01/20/2014   Pain of right thigh 07/62/2633   Salicylate overdose 35/45/6256   Alcohol abuse, continuous 09/24/2012    Class: Acute   Alcohol dependence ([place]) 09/24/2012   Alcohol withdrawal ([place]) 09/24/2012    REFERRING DIAG: lower back pain   THERAPY DIAG:  Other low back pain  Muscle weakness (generalized)  Other abnormalities of gait and mobility  Rationale for Evaluation and Treatment Rehabilitation  PERTINENT HISTORY: COPD, HTN   PRECAUTIONS: Fall  SUBJECTIVE:  Does feel better after PT sessions SUBJECTIVE STATEMENT:  Reporting 7/10 low back pain, worse with activity and better with rest and topical creams.    PAIN:  Are you having pain?  Yes: NPRS scale: 6/10 Pain location: lower back; L LE  Pain description: sharp Aggravating factors: prolonged standing, walking Relieving factors: medication, rest   OBJECTIVE: (objective measures completed at initial evaluation unless otherwise dated)   DIAGNOSTIC FINDINGS:  See imaging    VITALS:  O2: 94-96% during evaluation   PATIENT SURVEYS:  Modified  Oswestry: 34/50    COGNITION: Overall cognitive status: Within functional limits for tasks assessed                          SENSATION: WFL   MUSCLE LENGTH: Thomas test: Right (-); Left (+)   POSTURE: rounded shoulders, forward head, and increased lumbar lordosis   PALPATION: TTP to lumbar paraspinals, L gluteals   LUMBAR ROM:    AROM eval  Flexion WNL  Extension Reduced; no pain  Right lateral flexion    Left lateral flexion    Right rotation    Left rotation     (Blank rows = not tested)   LOWER EXTREMITY MMT:     MMT Right eval Left eval  Hip flexion 3+/5 3+/5  Hip extension      Hip abduction 3/5 3/5  Hip adduction 4/5 4/5  Hip internal rotation      Hip external rotation      Knee flexion 4/5 4/5  Knee extension 4/5 4/5  Ankle dorsiflexion      Ankle plantarflexion      Ankle inversion      Ankle eversion       (Blank rows = not tested)   LUMBAR SPECIAL TESTS:  Straight leg raise test: Positive and Slump test: Positive   FUNCTIONAL TESTS:  30 Second Sit to Stand: 3 reps   GAIT: Distance walked: 38ft Assistive device utilized: Single point cane Level of assistance: Modified independence Comments: trunk flexed, decreased gait speed   TREATMENT: [facility] Adult PT Treatment:                                                DATE: 10/27/22 Therapeutic Exercise: Nustep level 2 x 8 mins Supine QL stretch 30s x2 Supine march 2# 15/15 Seated hamstring stretch 2x30" BIL 90/90 30s x2 POE (as shoulder pain allows) 2 min Bridges x15 Modified thomas stretch 30s x2 with bolster  [facility] Adult PT Treatment:                                                DATE: 10/25/22 Therapeutic Exercise: Nustep level 5 x 6 mins Standing hip abduction/extension 2x10 BIL Seated hamstring stretch 2x30" BIL Supine clamshell GTB 2x10 Supine marching GTB 2x30" Bridges x10 Modified thomas stretch EOM x1' BIL LTR x10 BIL  [facility] Adult PT Treatment:                                                 DATE: 10/16/2022  Therapeutic Exercise: Modified thomas stretch x 60" Seated clamshell RTB x 15 Seated hamstring stretch x 30" each   PATIENT EDUCATION:  Education details: eval findings, FOTO, HEP, POC Person educated: Patient Education method: Explanation, Demonstration, and Handouts Education comprehension: verbalized understanding and returned demonstration   HOME EXERCISE PROGRAM: Access Code: [email or web address] URL: [email or web address] Date: 10/16/2022 Prepared by: Octavio Manns   Exercises - Modified Marcello Moores Stretch  - 1-2 x daily - 7 x weekly - 2 reps - 60 sec hold - Seated Hip Abduction with Resistance  - 1 x daily - 7 x weekly - 2-3 sets - 15 reps - red band hold - Seated Hamstring Stretch  - 1-2 x daily - 7 x weekly - 2 reps - 30 sec hold   ASSESSMENT:   CLINICAL IMPRESSION: Reporting less pain and stiffness following PT sessions.  Symptoms onset with prolonged activity as he takes care of his sister.  Focus of today was continued stretching of low back and core strengthening. Added prone positioning as tolerated due to ongoing shoulder OA.  Showing more upright posture and increased cadence at end of session.    OBJECTIVE IMPAIRMENTS: Abnormal gait, decreased activity tolerance, decreased balance, decreased mobility, difficulty walking, decreased ROM, decreased strength, and pain.    ACTIVITY LIMITATIONS: standing, squatting, sleeping, stairs, transfers, and locomotion level   PARTICIPATION LIMITATIONS: meal prep, cleaning, driving, shopping, community activity, and occupation   PERSONAL FACTORS: Fitness, Time since onset of injury/illness/exacerbation, and 1-2 comorbidities: COPD, HTN  are also affecting patient's functional outcome.    REHAB POTENTIAL: Good   CLINICAL DECISION MAKING: Evolving/moderate complexity   EVALUATION COMPLEXITY: Moderate     GOALS: Goals reviewed with patient? No   SHORT TERM GOALS: Target date: 11/06/2022   Pt will be  compliant and knowledgeable with initial HEP for improved comfort and carryover Baseline: initial HEP given  Goal status: INITIAL   2.  Pt will self report lower back pain no greater than 6/10 for improved comfort and functional ability Baseline: 10/10 at worst Goal status: INITIAL    LONG TERM GOALS: Target date: 12/11/2022   Pt will be decrease ODI disability score to no greater than 54% as proxy for functional improvement Baseline: 68% disability  Goal status: INITIAL    2.  Pt will self report lower back pain no greater than 3/10 for improved comfort and functional ability Baseline: 10/10 at worst Goal status: INITIAL    3.  Pt will increase 30 Second Sit to Stand rep count to no less than 6 reps for improved balance, strength, and functional mobility Baseline: 3 reps (MCID 2)  Goal status: INITIAL    4.  Pt will improve bilateral hip MMT to no less than 4/5 for improved comfort and functional mobility Baseline: see chart Goal status: INITIAL   PLAN:   PT FREQUENCY: 1-2x/week   PT DURATION: 8 weeks   PLANNED INTERVENTIONS: Therapeutic exercises, Therapeutic activity, Neuromuscular re-education, Balance training, Gait training, Patient/Family education, Self Care, Joint mobilization, Aquatic Therapy, Dry Needling, Electrical stimulation, Cryotherapy, Moist heat, Vasopneumatic device, Manual therapy, and Re-evaluation.   PLAN FOR NEXT SESSION: assess HEP response, core and hip strengthening, hip flexor stretching   Lanice Shirts, PT 10/27/2022, 10:13 AM

## 2022-10-27 ENCOUNTER — Ambulatory Visit: Payer: 59

## 2022-10-27 DIAGNOSIS — M6281 Muscle weakness (generalized): Secondary | ICD-10-CM

## 2022-10-27 DIAGNOSIS — M5459 Other low back pain: Secondary | ICD-10-CM

## 2022-10-27 DIAGNOSIS — R2689 Other abnormalities of gait and mobility: Secondary | ICD-10-CM | POA: Diagnosis not present

## 2022-10-31 ENCOUNTER — Ambulatory Visit: Payer: 59

## 2022-10-31 DIAGNOSIS — M5459 Other low back pain: Secondary | ICD-10-CM | POA: Diagnosis not present

## 2022-10-31 DIAGNOSIS — M6281 Muscle weakness (generalized): Secondary | ICD-10-CM | POA: Diagnosis not present

## 2022-10-31 DIAGNOSIS — R2689 Other abnormalities of gait and mobility: Secondary | ICD-10-CM

## 2022-10-31 NOTE — Therapy (Signed)
OUTPATIENT PHYSICAL THERAPY TREATMENT NOTE   Patient Name: [name] MRN: [id number] DOB:[birth year], 57 y.o., male Today's Date: 10/31/2022  PCP: Benito Mccreedy, MD   REFERRING PROVIDER: Benito Mccreedy, MD    END OF SESSION:   PT End of Session - 10/31/22 0921     Visit Number 4    Number of Visits 17    Date for PT Re-Evaluation 12/11/22    Authorization Type UHC MCR    PT Start Time 0922   arrived late   PT Stop Time 1001    PT Time Calculation (min) 39 min    Activity Tolerance Patient tolerated treatment well    Behavior During Therapy WFL for tasks assessed/performed              Past Medical History:  Diagnosis Date   Alcohol abuse    Allergy    Anxiety    Arthritis    Asthma    COPD (chronic obstructive pulmonary disease) ([place])    Depression    Hypertension    Shortness of breath    Past Surgical History:  Procedure Laterality Date   BACK SURGERY     BIOPSY  06/02/2019   Procedure: BIOPSY;  Surgeon: Otis Brace, MD;  [facility];  Service: Gastroenterology;;   COLONOSCOPY WITH PROPOFOL N/A 06/02/2019   Procedure: COLONOSCOPY WITH PROPOFOL;  Surgeon: Otis Brace, MD;  [facility]: [facility];  Service: Gastroenterology;  Laterality: N/A;   ESOPHAGOGASTRODUODENOSCOPY (EGD) WITH PROPOFOL N/A 06/02/2019   Procedure: ESOPHAGOGASTRODUODENOSCOPY (EGD) WITH PROPOFOL;  Surgeon: Otis Brace, MD;  [facility];  Service: Gastroenterology;  Laterality: N/A;   HEMORRHOID SURGERY     HERNIA REPAIR     MANDIBLE FRACTURE SURGERY     POLYPECTOMY  06/02/2019   Procedure: POLYPECTOMY;  Surgeon: Otis Brace, MD;  Location: [facility];  Service: Gastroenterology;;   Patient Active Problem List   Diagnosis Date Noted   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   Elevated transaminase level 06/07/2021   Macrocytic anemia 06/07/2021   Mixed hyperlipidemia 06/07/2021   Neuropathy 06/07/2021   Shortness  of breath 06/07/2021   Ventral hernia without obstruction or gangrene 06/07/2021   Vitamin B deficiency 06/07/2021   Vitamin D deficiency 06/07/2021   S/P repair of ventral hernia 04/29/2020   Malnutrition of moderate degree 10/14/2019   Alcohol withdrawal with inpatient treatment ([place]) 10/12/2019   COPD exacerbation ([place]) 10/11/2019   Tobacco abuse 10/11/2019   Respiratory failure with hypoxia ([place]) 10/11/2019   Leukopenia 10/11/2019   Thrombocytopenia ([place]) 10/11/2019   Rectal bleeding 10/11/2019   Preoperative clearance 08/31/2017   COPD (chronic obstructive pulmonary disease) ([place]) 11/17/2015   Alcohol dependence with uncomplicated withdrawal ([place]) 06/29/2014   S/P alcohol detoxification 01/20/2014   Pain of right thigh 123456   Salicylate overdose 123456   Alcohol abuse, continuous 09/24/2012    Class: Acute   Alcohol dependence ([place]) 09/24/2012   Alcohol withdrawal ([place]) 09/24/2012    REFERRING DIAG: lower back pain   THERAPY DIAG:  Other low back pain  Muscle weakness (generalized)  Other abnormalities of gait and mobility  Rationale for Evaluation and Treatment Rehabilitation  PERTINENT HISTORY: COPD, HTN   PRECAUTIONS: Fall  SUBJECTIVE STATEMENT:  Presents to PT with reports of continued LBP. Has been compliant with HEP with no adverse effect.    PAIN:  Are you having pain?  Yes: NPRS scale: 6/10 Pain location: lower back; L LE  Pain description: sharp Aggravating factors: prolonged standing, walking Relieving  factors: medication, rest   OBJECTIVE: (objective measures completed at initial evaluation unless otherwise dated)   DIAGNOSTIC FINDINGS:  See imaging    VITALS:  O2: 94-96% during evaluation   PATIENT SURVEYS:  Modified Oswestry: 34/50    COGNITION: Overall cognitive status: Within functional limits for tasks assessed                          SENSATION: WFL   MUSCLE LENGTH: Thomas test: Right (-); Left (+)   POSTURE:  rounded shoulders, forward head, and increased lumbar lordosis   PALPATION: TTP to lumbar paraspinals, L gluteals   LUMBAR ROM:    AROM eval  Flexion WNL  Extension Reduced; no pain  Right lateral flexion    Left lateral flexion    Right rotation    Left rotation     (Blank rows = not tested)   LOWER EXTREMITY MMT:     MMT Right eval Left eval  Hip flexion 3+/5 3+/5  Hip extension      Hip abduction 3/5 3/5  Hip adduction 4/5 4/5  Hip internal rotation      Hip external rotation      Knee flexion 4/5 4/5  Knee extension 4/5 4/5  Ankle dorsiflexion      Ankle plantarflexion      Ankle inversion      Ankle eversion       (Blank rows = not tested)   LUMBAR SPECIAL TESTS:  Straight leg raise test: Positive and Slump test: Positive   FUNCTIONAL TESTS:  30 Second Sit to Stand: 3 reps   GAIT: Distance walked: 44f Assistive device utilized: Single point cane Level of assistance: Modified independence Comments: trunk flexed, decreased gait speed   [facility]                                                [facility]: [facility] Therapeutic Exercise: Seated hamstring x 30"  LTR x 10 Bridge x 10 Supine SLR x 10 each Modified thomas stretch 2x30" each Supine clamshell 2x10 RTB LAQ 2x10 2.5#  [facility] Adult PT Treatment:                                                DATE: 10/27/22 Therapeutic Exercise: Nustep level 2 x 8 mins Supine QL stretch 30s x2 Supine march 2# 15/15 Seated hamstring stretch 2x30" BIL 90/90 30s x2 POE (as shoulder pain allows) 2 min Bridges x15 Modified thomas stretch 30s x2 with bolster  [facility]                                                [facility]: 10/25/22 Therapeutic Exercise: Nustep level 5 x 6 mins Standing hip abduction/extension 2x10 BIL Seated hamstring stretch 2x30" BIL Supine clamshell GTB 2x10 Supine marching GTB 2x30" Bridges x10 Modified thomas stretch EOM x1' BIL LTR x10 BIL  [facility] Adult PT  Treatment:  DATE: 10/16/2022 Therapeutic Exercise: Modified thomas stretch x 60" Seated clamshell RTB x 15 Seated hamstring stretch x 30" each   PATIENT EDUCATION:  Education details: eval findings, FOTO, HEP, POC Person educated: Patient Education method: Explanation, Demonstration, and Handouts Education comprehension: verbalized understanding and returned demonstration   HOME EXERCISE PROGRAM: Access Code: [email or web address] URL: [email or web address] Date: 10/31/2022 Prepared by: Octavio Manns  Exercises - Modified Marcello Moores Stretch  - 1-2 x daily - 7 x weekly - 2 reps - 60 sec hold - Seated Hip Abduction with Resistance  - 1 x daily - 7 x weekly - 2-3 sets - 15 reps - red band hold - Seated Hamstring Stretch  - 1-2 x daily - 7 x weekly - 2 reps - 30 sec hold - Supine Bridge  - 1 x daily - 7 x weekly - 2 sets - 10 reps   ASSESSMENT:   CLINICAL IMPRESSION:  Pt was able to complete all prescribed exercises with no adverse effect or increase in pain. Therapy focused on improving core and proximal hip strength in order to decrease pain and improve comfort. HEP updated for continued core strengthening. Will continue to progress as able per POC.   OBJECTIVE IMPAIRMENTS: Abnormal gait, decreased activity tolerance, decreased balance, decreased mobility, difficulty walking, decreased ROM, decreased strength, and pain.    ACTIVITY LIMITATIONS: standing, squatting, sleeping, stairs, transfers, and locomotion level   PARTICIPATION LIMITATIONS: meal prep, cleaning, driving, shopping, community activity, and occupation   PERSONAL FACTORS: Fitness, Time since onset of injury/illness/exacerbation, and 1-2 comorbidities: COPD, HTN  are also affecting patient's functional outcome.      GOALS: Goals reviewed with patient? No   SHORT TERM GOALS: Target date: 11/06/2022   Pt will be compliant and knowledgeable with initial HEP for improved  comfort and carryover Baseline: initial HEP given  Goal status: INITIAL   2.  Pt will self report lower back pain no greater than 6/10 for improved comfort and functional ability Baseline: 10/10 at worst Goal status: INITIAL    LONG TERM GOALS: Target date: 12/11/2022   Pt will be decrease ODI disability score to no greater than 54% as proxy for functional improvement Baseline: 68% disability  Goal status: INITIAL    2.  Pt will self report lower back pain no greater than 3/10 for improved comfort and functional ability Baseline: 10/10 at worst Goal status: INITIAL    3.  Pt will increase 30 Second Sit to Stand rep count to no less than 6 reps for improved balance, strength, and functional mobility Baseline: 3 reps (MCID 2)  Goal status: INITIAL    4.  Pt will improve bilateral hip MMT to no less than 4/5 for improved comfort and functional mobility Baseline: see chart Goal status: INITIAL   PLAN:   PT FREQUENCY: 1-2x/week   PT DURATION: 8 weeks   PLANNED INTERVENTIONS: Therapeutic exercises, Therapeutic activity, Neuromuscular re-education, Balance training, Gait training, Patient/Family education, Self Care, Joint mobilization, Aquatic Therapy, Dry Needling, Electrical stimulation, Cryotherapy, Moist heat, Vasopneumatic device, Manual therapy, and Re-evaluation.   PLAN FOR NEXT SESSION: assess HEP response, core and hip strengthening, hip flexor stretching   Ward Chatters, PT 10/31/2022, 10:07 AM

## 2022-11-02 ENCOUNTER — Ambulatory Visit: Payer: 59

## 2022-11-07 ENCOUNTER — Ambulatory Visit: Payer: 59

## 2022-11-07 DIAGNOSIS — R2689 Other abnormalities of gait and mobility: Secondary | ICD-10-CM

## 2022-11-07 DIAGNOSIS — M5459 Other low back pain: Secondary | ICD-10-CM | POA: Diagnosis not present

## 2022-11-07 DIAGNOSIS — M6281 Muscle weakness (generalized): Secondary | ICD-10-CM

## 2022-11-07 NOTE — Therapy (Signed)
OUTPATIENT PHYSICAL THERAPY TREATMENT NOTE   Patient Name: [name] MRN: [id number] DOB:12-04-1965, 57 y.o., male Today's Date: 11/07/2022  PCP: Benito Mccreedy, MD   REFERRING PROVIDER: Benito Mccreedy, MD    END OF SESSION:   PT End of Session - 11/07/22 0946     Visit Number 5    Number of Visits 17    Date for PT Re-Evaluation 12/11/22    Authorization Type [employer] MCR    PT Start Time 0958    PT Stop Time 1040    PT Time Calculation (min) 42 min    Activity Tolerance Patient tolerated treatment well    Behavior During Therapy WFL for tasks assessed/performed               Past Medical History:  Diagnosis Date   Alcohol abuse    Allergy    Anxiety    Arthritis    Asthma    COPD (chronic obstructive pulmonary disease) ([place])    Depression    Hypertension    Shortness of breath    Past Surgical History:  Procedure Laterality Date   BACK SURGERY     BIOPSY  06/02/2019   Procedure: BIOPSY;  Surgeon: Otis Brace, MD;  Location: [facility];  Service: Gastroenterology;;   COLONOSCOPY WITH PROPOFOL N/A 06/02/2019   Procedure: COLONOSCOPY WITH PROPOFOL;  Surgeon: Otis Brace, MD;  Location: [facility];  Service: Gastroenterology;  Laterality: N/A;   ESOPHAGOGASTRODUODENOSCOPY (EGD) WITH PROPOFOL N/A 06/02/2019   Procedure: ESOPHAGOGASTRODUODENOSCOPY (EGD) WITH PROPOFOL;  Surgeon: Otis Brace, MD;  Location: [facility];  Service: Gastroenterology;  Laterality: N/A;   HEMORRHOID SURGERY     HERNIA REPAIR     MANDIBLE FRACTURE SURGERY     POLYPECTOMY  06/02/2019   Procedure: POLYPECTOMY;  Surgeon: Otis Brace, MD;  Location: [facility];  Service: Gastroenterology;;   Patient Active Problem List   Diagnosis Date Noted   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) 10/12/2019   COPD exacerbation ([place]) 10/11/2019   Tobacco abuse 10/11/2019   Respiratory failure with hypoxia ([place]) 10/11/2019   Leukopenia 10/11/2019   Thrombocytopenia ([place]) 10/11/2019   Rectal bleeding 10/11/2019   Preoperative clearance 08/31/2017   COPD (chronic obstructive pulmonary disease) ([place]) 11/17/2015   Alcohol dependence with uncomplicated withdrawal ([place]) 06/29/2014   S/P alcohol detoxification 01/20/2014   Pain of right thigh 123456   Salicylate overdose 123456   Alcohol abuse, continuous 09/24/2012    Class: Acute   Alcohol dependence ([place]) 09/24/2012   Alcohol withdrawal ([place]) 09/24/2012    REFERRING DIAG: lower back pain   THERAPY DIAG:  Other low back pain  Muscle weakness (generalized)  Other abnormalities of gait and mobility  Rationale for Evaluation and Treatment Rehabilitation  PERTINENT HISTORY: COPD, HTN   PRECAUTIONS: Fall  SUBJECTIVE STATEMENT: Patient reports increased pain in his lower back and LLE today, stating his "arthritis is acting up."   PAIN:  Are you having pain?  Yes: NPRS scale: 8/10 Pain location: lower back; L LE  Pain description: sharp Aggravating factors: prolonged standing, walking Relieving factors: medication, rest  OBJECTIVE: (objective measures completed at initial evaluation unless otherwise dated)   DIAGNOSTIC FINDINGS:  See imaging    VITALS:  O2: 94-96% during evaluation   PATIENT SURVEYS:  Modified Oswestry: 34/50    COGNITION: Overall cognitive status: Within functional limits for tasks assessed                          SENSATION: WFL   MUSCLE LENGTH: Thomas test: Right (-); Left (+)   POSTURE: rounded  shoulders, forward head, and increased lumbar lordosis   PALPATION: TTP to lumbar paraspinals, L gluteals   LUMBAR ROM:    AROM eval  Flexion WNL  Extension Reduced; no pain  Right lateral flexion    Left lateral flexion    Right rotation    Left rotation     (Blank rows = not tested)   LOWER EXTREMITY MMT:     MMT Right eval Left eval  Hip flexion 3+/5 3+/5  Hip extension      Hip abduction 3/5 3/5  Hip adduction 4/5 4/5  Hip internal rotation      Hip external rotation      Knee flexion 4/5 4/5  Knee extension 4/5 4/5  Ankle dorsiflexion      Ankle plantarflexion      Ankle inversion      Ankle eversion       (Blank rows = not tested)   LUMBAR SPECIAL TESTS:  Straight leg raise test: Positive and Slump test: Positive   FUNCTIONAL TESTS:  30 Second Sit to Stand: 3 reps   GAIT: Distance walked: 35ft Assistive device utilized: Single point cane Level of assistance: Modified independence Comments: trunk flexed, decreased gait speed   TREATMENT: [facility] Adult PT Treatment:                                                DATE: 11/07/22 Therapeutic Exercise: Nustep level 2 x 5 mins Seated hamstring stretch x 30" BIL Supine marching GTB 2x30" LTR x 10 BIL Bridge 2 x 10 Supine SLR 2 x 10 each Modified thomas stretch x1" BIL Supine clamshell 2x10 GTB LAQ x10 2.5#   [facility] Adult PT Treatment:                                                DATE: 10/31/22 Therapeutic Exercise: Seated hamstring x 30"  LTR x 10 Bridge x 10 Supine SLR x 10 each Modified thomas stretch 2x30" each Supine clamshell 2x10 RTB LAQ 2x10 2.5#  [facility] Adult PT Treatment:                                                DATE: 10/27/22 Therapeutic Exercise: Nustep level 2 x 8 mins Supine QL stretch 30s x2 Supine march 2# 15/15 Seated hamstring stretch 2x30" BIL 90/90 30s x2 POE (as shoulder pain allows) 2 min Bridges x15 Modified thomas stretch 30s x2 with bolster     PATIENT EDUCATION:   Education details: eval findings, FOTO, HEP, POC Person educated: Patient Education method: Explanation, Demonstration,  and Handouts Education comprehension: verbalized understanding and returned demonstration   HOME EXERCISE PROGRAM: Access Code: [email or web address] URL: [email or web address] Date: 10/31/2022 Prepared by: Octavio Manns  Exercises - Modified Marcello Moores Stretch  - 1-2 x daily - 7 x weekly - 2 reps - 60 sec hold - Seated Hip Abduction with Resistance  - 1 x daily - 7 x weekly - 2-3 sets - 15 reps - red band hold - Seated Hamstring Stretch  - 1-2 x daily - 7 x weekly - 2 reps - 30 sec hold - Supine Bridge  - 1 x daily - 7 x weekly - 2 sets - 10 reps   ASSESSMENT:   CLINICAL IMPRESSION:  Patient presents to PT with increased pain in his lower back and LLE, reports HEP compliance. Session today continued to focus on core and proximal hip strengthening. He had difficulty with LAQ today, particularly on LLE, resulting in increased posterior trunk lean, terminated after first set. He moves slow and guarded throughout session, but is able to complete exercises. Patient continues to benefit from skilled PT services and should be progressed as able to improve functional independence.    OBJECTIVE IMPAIRMENTS: Abnormal gait, decreased activity tolerance, decreased balance, decreased mobility, difficulty walking, decreased ROM, decreased strength, and pain.    ACTIVITY LIMITATIONS: standing, squatting, sleeping, stairs, transfers, and locomotion level   PARTICIPATION LIMITATIONS: meal prep, cleaning, driving, shopping, community activity, and occupation   PERSONAL FACTORS: Fitness, Time since onset of injury/illness/exacerbation, and 1-2 comorbidities: COPD, HTN  are also affecting patient's functional outcome.      GOALS: Goals reviewed with patient? No   SHORT TERM GOALS: Target date: 11/06/2022   Pt will be compliant and knowledgeable with initial HEP for improved comfort  and carryover Baseline: initial HEP given  Goal status: MET Patient reports adherence 11/07/22   2.  Pt will self report lower back pain no greater than 6/10 for improved comfort and functional ability Baseline: 10/10 at worst Goal status: Ongoing   LONG TERM GOALS: Target date: 12/11/2022   Pt will be decrease ODI disability score to no greater than 54% as proxy for functional improvement Baseline: 68% disability  Goal status: INITIAL    2.  Pt will self report lower back pain no greater than 3/10 for improved comfort and functional ability Baseline: 10/10 at worst Goal status: INITIAL    3.  Pt will increase 30 Second Sit to Stand rep count to no less than 6 reps for improved balance, strength, and functional mobility Baseline: 3 reps (MCID 2)  Goal status: INITIAL    4.  Pt will improve bilateral hip MMT to no less than 4/5 for improved comfort and functional mobility Baseline: see chart Goal status: INITIAL   PLAN:   PT FREQUENCY: 1-2x/week   PT DURATION: 8 weeks   PLANNED INTERVENTIONS: Therapeutic exercises, Therapeutic activity, Neuromuscular re-education, Balance training, Gait training, Patient/Family education, Self Care, Joint mobilization, Aquatic Therapy, Dry Needling, Electrical stimulation, Cryotherapy, Moist heat, Vasopneumatic device, Manual therapy, and Re-evaluation.   PLAN FOR NEXT SESSION: assess HEP response, core and hip strengthening, hip flexor stretching   Margarette Canada, PTA 11/07/2022, 10:17 AM

## 2022-11-09 ENCOUNTER — Telehealth: Payer: Self-pay

## 2022-11-09 ENCOUNTER — Ambulatory Visit: Payer: 59

## 2022-11-09 NOTE — Telephone Encounter (Signed)
Spoke with patient regarding missed visit today. He stated transportation did not come and pick him up today. Confirmed next appointment time.  Margarette Canada, PTA 11/09/22 11:58 AM

## 2022-11-14 ENCOUNTER — Ambulatory Visit: Payer: 59

## 2022-11-15 DIAGNOSIS — Z720 Tobacco use: Secondary | ICD-10-CM | POA: Diagnosis not present

## 2022-11-15 DIAGNOSIS — E559 Vitamin D deficiency, unspecified: Secondary | ICD-10-CM | POA: Diagnosis not present

## 2022-11-15 DIAGNOSIS — Z0001 Encounter for general adult medical examination with abnormal findings: Secondary | ICD-10-CM | POA: Diagnosis not present

## 2022-11-15 DIAGNOSIS — E782 Mixed hyperlipidemia: Secondary | ICD-10-CM | POA: Diagnosis not present

## 2022-11-15 DIAGNOSIS — J449 Chronic obstructive pulmonary disease, unspecified: Secondary | ICD-10-CM | POA: Diagnosis not present

## 2022-11-15 DIAGNOSIS — Z131 Encounter for screening for diabetes mellitus: Secondary | ICD-10-CM | POA: Diagnosis not present

## 2022-11-15 DIAGNOSIS — Z136 Encounter for screening for cardiovascular disorders: Secondary | ICD-10-CM | POA: Diagnosis not present

## 2022-11-15 NOTE — Therapy (Addendum)
OUTPATIENT PHYSICAL THERAPY TREATMENT NOTE/DISCHARGE  PHYSICAL THERAPY DISCHARGE SUMMARY  Visits from Start of Care: 6  Current functional level related to goals / functional outcomes: See goals/objective   Remaining deficits: Unable to assess   Education / Equipment: HEP   Patient agrees to discharge. Patient goals were unable to assess. Patient is being discharged due to not returning since the last visit.     Patient Name: [name] MRN: [id number] DOB:October 16, 1965, 57 y.o., male Today's Date: 02/21/2023  PCP: Jackie Plum, MD   REFERRING PROVIDER: Jackie Plum, MD    END OF SESSION:     Past Medical History:  Diagnosis Date   Alcohol abuse    Allergy    Anxiety    Arthritis    Asthma    COPD (chronic obstructive pulmonary disease) ([facility])    Depression    Hypertension    Shortness of breath    Past Surgical History:  Procedure Laterality Date   BACK SURGERY     BIOPSY  06/02/2019   Procedure: BIOPSY;  Surgeon: Kathi Der, MD;  Location: [facility];  Service: Gastroenterology;;   COLONOSCOPY WITH PROPOFOL N/A 06/02/2019   Procedure: COLONOSCOPY WITH PROPOFOL;  Surgeon: Kathi Der, MD;  Location: [facility];  Service: Gastroenterology;  Laterality: N/A;   ESOPHAGOGASTRODUODENOSCOPY (EGD) WITH PROPOFOL N/A 06/02/2019   Procedure: ESOPHAGOGASTRODUODENOSCOPY (EGD) WITH PROPOFOL;  Surgeon: Kathi Der, MD;  [facility]: [facility];  Service: Gastroenterology;  Laterality: N/A;   HEMORRHOID SURGERY     HERNIA REPAIR     MANDIBLE FRACTURE SURGERY     POLYPECTOMY  06/02/2019   Procedure: POLYPECTOMY;  Surgeon: Kathi Der, MD;  Location: [facility];  Service: Gastroenterology;;   Patient Active Problem List   Diagnosis Date Noted   Alcohol-induced polyneuropathy ([facility]) 06/07/2021   Elevated transaminase level 06/07/2021   Macrocytic anemia 06/07/2021   Mixed hyperlipidemia 06/07/2021   Neuropathy 06/07/2021    Shortness of breath 06/07/2021   Ventral hernia without obstruction or gangrene 06/07/2021   Vitamin B deficiency 06/07/2021   Vitamin D deficiency 06/07/2021   S/P repair of ventral hernia 04/29/2020   Malnutrition of moderate degree 10/14/2019   Alcohol withdrawal with inpatient treatment ([facility]) 10/12/2019   COPD exacerbation ([facility]) 10/11/2019   Tobacco abuse 10/11/2019   Respiratory failure with hypoxia ([facility]) 10/11/2019   Leukopenia 10/11/2019   Thrombocytopenia ([facility]) 10/11/2019   Rectal bleeding 10/11/2019   Preoperative clearance 08/31/2017   COPD (chronic obstructive pulmonary disease) ([facility]) 11/17/2015   Alcohol dependence with uncomplicated withdrawal ([facility]) 06/29/2014   S/P alcohol detoxification 01/20/2014   Pain of right thigh 01/20/2014   Salicylate overdose 01/20/2014   Alcohol abuse, continuous 09/24/2012    Class: Acute   Alcohol dependence ([facility]) 09/24/2012   Alcohol withdrawal ([facility]) 09/24/2012    REFERRING DIAG: lower back pain   THERAPY DIAG:  Other low back pain  Muscle weakness (generalized)  Other abnormalities of gait and mobility  Rationale for Evaluation and Treatment Rehabilitation  PERTINENT HISTORY: COPD, HTN   PRECAUTIONS: Fall  SUBJECTIVE STATEMENT: Patient reports increased pain today and this week that he attributes to rainy weather this week, states his lower back and posterior LLE are hurting the most.    PAIN:  Are you having pain?  Yes: NPRS scale: 8/10 Pain location: lower back; L LE  Pain description: sharp Aggravating factors: prolonged standing, walking Relieving factors: medication, rest   OBJECTIVE: (objective measures completed at initial evaluation unless otherwise dated)   DIAGNOSTIC FINDINGS:  See imaging    VITALS:  O2: 94-96% during evaluation   PATIENT SURVEYS:  Modified Oswestry: 34/50    COGNITION: Overall cognitive status: Within functional limits for tasks assessed                           SENSATION: WFL   MUSCLE LENGTH: Thomas test: Right (-); Left (+)   POSTURE: rounded shoulders, forward head, and increased lumbar lordosis   PALPATION: TTP to lumbar paraspinals, L gluteals   LUMBAR ROM:    AROM eval  Flexion WNL  Extension Reduced; no pain  Right lateral flexion    Left lateral flexion    Right rotation    Left rotation     (Blank rows = not tested)   LOWER EXTREMITY MMT:     MMT Right eval Left eval  Hip flexion 3+/5 3+/5  Hip extension      Hip abduction 3/5 3/5  Hip adduction 4/5 4/5  Hip internal rotation      Hip external rotation      Knee flexion 4/5 4/5  Knee extension 4/5 4/5  Ankle dorsiflexion      Ankle plantarflexion      Ankle inversion      Ankle eversion       (Blank rows = not tested)   LUMBAR SPECIAL TESTS:  Straight leg raise test: Positive and Slump test: Positive   FUNCTIONAL TESTS:  30 Second Sit to Stand: 3 reps   GAIT: Distance walked: 70ft Assistive device utilized: Single point cane Level of assistance: Modified independence Comments: trunk flexed, decreased gait speed   TREATMENT: [facility] Adult PT Treatment:                                                DATE: 11/16/22 Therapeutic Exercise: Nustep level 3 x 5 mins Sidestepping at counter x4 laps - cues for upright posture (difficult due to hip flexor tightness) Standing hip abduction x10 BIL - cues for posture, form, pacing Seated hamstring stretch 2 x 30" BIL Supine hip adduction ball squeeze 5" hold 2x10 Supine marching GTB 3x30" LTR x 10 BIL Bridge x 2 (terminated d/t increased pain) Supine SLR x 10 each 2# Modified thomas stretch x1" BIL Supine clamshell 2x15 GTB Modalities: Supine exercises performed on MHP with 2 layers   [facility] Adult PT Treatment:                                                DATE: 11/07/22 Therapeutic Exercise: Nustep level 2 x 5 mins Seated hamstring stretch x 30" BIL Supine marching GTB 2x30" LTR x 10 BIL Bridge 2 x  10 Supine SLR 2 x 10 each Modified thomas stretch x1" BIL Supine clamshell 2x10 GTB LAQ x10 2.5#   [facility] Adult PT Treatment:                                                DATE: 10/31/22 Therapeutic Exercise: Seated hamstring x 30"  LTR x 10 Bridge x 10 Supine SLR x 10  each Modified thomas stretch 2x30" each Supine clamshell 2x10 RTB LAQ 2x10 2.5#    PATIENT EDUCATION:  Education details: eval findings, FOTO, HEP, POC Person educated: Patient Education method: Explanation, Demonstration, and Handouts Education comprehension: verbalized understanding and returned demonstration   HOME EXERCISE PROGRAM: Access Code: [email or web address] URL: [email or web address] Date: 10/31/2022 Prepared by: Edwinna Areola  Exercises - Modified Maisie Fus Stretch  - 1-2 x daily - 7 x weekly - 2 reps - 60 sec hold - Seated Hip Abduction with Resistance  - 1 x daily - 7 x weekly - 2-3 sets - 15 reps - red band hold - Seated Hamstring Stretch  - 1-2 x daily - 7 x weekly - 2 reps - 30 sec hold - Supine Bridge  - 1 x daily - 7 x weekly - 2 sets - 10 reps   ASSESSMENT:   CLINICAL IMPRESSION:  Patient presents to PT reporting increased pain in his lower back and LLE that he attributes to rainy weather this week. Session today continued to focus on core and proximal hip strengthening with incorporation of more standing exercises today to moderate effect. He needs cues to maintain upright posture and perform exercises with good form. Supine exercises performed on MHP today to decrease tension and pain in lower back with patient reporting therapeutic benefit. Patient continues to benefit from skilled PT services and should be progressed as able to improve functional independence.    OBJECTIVE IMPAIRMENTS: Abnormal gait, decreased activity tolerance, decreased balance, decreased mobility, difficulty walking, decreased ROM, decreased strength, and pain.    ACTIVITY LIMITATIONS: standing, squatting, sleeping,  stairs, transfers, and locomotion level   PARTICIPATION LIMITATIONS: meal prep, cleaning, driving, shopping, community activity, and occupation   PERSONAL FACTORS: Fitness, Time since onset of injury/illness/exacerbation, and 1-2 comorbidities: COPD, HTN  are also affecting patient's functional outcome.      GOALS: Goals reviewed with patient? No   SHORT TERM GOALS: Target date: 11/06/2022   Pt will be compliant and knowledgeable with initial HEP for improved comfort and carryover Baseline: initial HEP given  Goal status: MET Patient reports adherence 11/07/22   2.  Pt will self report lower back pain no greater than 6/10 for improved comfort and functional ability Baseline: 10/10 at worst Goal status: Ongoing   LONG TERM GOALS: Target date: 12/11/2022   Pt will be decrease ODI disability score to no greater than 54% as proxy for functional improvement Baseline: 68% disability  Goal status: INITIAL    2.  Pt will self report lower back pain no greater than 3/10 for improved comfort and functional ability Baseline: 10/10 at worst Goal status: INITIAL    3.  Pt will increase 30 Second Sit to Stand rep count to no less than 6 reps for improved balance, strength, and functional mobility Baseline: 3 reps (MCID 2)  Goal status: INITIAL    4.  Pt will improve bilateral hip MMT to no less than 4/5 for improved comfort and functional mobility Baseline: see chart Goal status: INITIAL   PLAN:   PT FREQUENCY: 1-2x/week   PT DURATION: 8 weeks   PLANNED INTERVENTIONS: Therapeutic exercises, Therapeutic activity, Neuromuscular re-education, Balance training, Gait training, Patient/Family education, Self Care, Joint mobilization, Aquatic Therapy, Dry Needling, Electrical stimulation, Cryotherapy, Moist heat, Vasopneumatic device, Manual therapy, and Re-evaluation.   PLAN FOR NEXT SESSION: assess HEP response, core and hip strengthening, hip flexor stretching   Eloy End,  PT 02/21/2023, 10:10 AM

## 2022-11-16 ENCOUNTER — Ambulatory Visit: Payer: 59

## 2022-11-16 DIAGNOSIS — M6281 Muscle weakness (generalized): Secondary | ICD-10-CM

## 2022-11-16 DIAGNOSIS — R2689 Other abnormalities of gait and mobility: Secondary | ICD-10-CM

## 2022-11-16 DIAGNOSIS — M5459 Other low back pain: Secondary | ICD-10-CM | POA: Diagnosis not present

## 2022-11-17 DIAGNOSIS — J449 Chronic obstructive pulmonary disease, unspecified: Secondary | ICD-10-CM | POA: Diagnosis not present

## 2022-11-17 DIAGNOSIS — G8929 Other chronic pain: Secondary | ICD-10-CM | POA: Diagnosis not present

## 2022-11-21 ENCOUNTER — Ambulatory Visit: Payer: 59 | Attending: Internal Medicine

## 2022-12-05 ENCOUNTER — Ambulatory Visit: Payer: 59

## 2022-12-07 ENCOUNTER — Ambulatory Visit: Payer: 59

## 2022-12-07 ENCOUNTER — Telehealth: Payer: Self-pay

## 2022-12-07 NOTE — Telephone Encounter (Signed)
PT called but was unable to leave voicemail regarding missed visit and review attendance policy. Because this was patient's 3rd no-show we will hold treatment until new referral.   Eloy End, PT 12/07/22 2:34 PM

## 2022-12-12 ENCOUNTER — Ambulatory Visit: Payer: 59

## 2022-12-14 ENCOUNTER — Ambulatory Visit: Payer: 59

## 2022-12-18 DIAGNOSIS — G8929 Other chronic pain: Secondary | ICD-10-CM | POA: Diagnosis not present

## 2022-12-18 DIAGNOSIS — J449 Chronic obstructive pulmonary disease, unspecified: Secondary | ICD-10-CM | POA: Diagnosis not present

## 2022-12-19 ENCOUNTER — Encounter: Payer: 59 | Admitting: Physical Therapy

## 2022-12-19 DIAGNOSIS — J449 Chronic obstructive pulmonary disease, unspecified: Secondary | ICD-10-CM | POA: Diagnosis not present

## 2022-12-19 DIAGNOSIS — E559 Vitamin D deficiency, unspecified: Secondary | ICD-10-CM | POA: Diagnosis not present

## 2022-12-19 DIAGNOSIS — Z720 Tobacco use: Secondary | ICD-10-CM | POA: Diagnosis not present

## 2022-12-19 DIAGNOSIS — E782 Mixed hyperlipidemia: Secondary | ICD-10-CM | POA: Diagnosis not present

## 2023-01-19 DIAGNOSIS — J449 Chronic obstructive pulmonary disease, unspecified: Secondary | ICD-10-CM | POA: Diagnosis not present

## 2023-01-19 DIAGNOSIS — G8929 Other chronic pain: Secondary | ICD-10-CM | POA: Diagnosis not present

## 2023-02-17 ENCOUNTER — Emergency Department (HOSPITAL_COMMUNITY): Payer: 59

## 2023-02-17 ENCOUNTER — Other Ambulatory Visit: Payer: Self-pay

## 2023-02-17 ENCOUNTER — Emergency Department (HOSPITAL_COMMUNITY)
Admission: EM | Admit: 2023-02-17 | Discharge: 2023-02-17 | Disposition: A | Discharge status: Home / Self Care | Payer: 59 | Attending: Emergency Medicine | Admitting: Emergency Medicine

## 2023-02-17 DIAGNOSIS — Y907 Blood alcohol level of 200-239 mg/100 ml: Secondary | ICD-10-CM | POA: Diagnosis not present

## 2023-02-17 DIAGNOSIS — J45909 Unspecified asthma, uncomplicated: Secondary | ICD-10-CM | POA: Diagnosis not present

## 2023-02-17 DIAGNOSIS — I10 Essential (primary) hypertension: Secondary | ICD-10-CM | POA: Diagnosis not present

## 2023-02-17 DIAGNOSIS — F17210 Nicotine dependence, cigarettes, uncomplicated: Secondary | ICD-10-CM | POA: Insufficient documentation

## 2023-02-17 DIAGNOSIS — R0602 Shortness of breath: Secondary | ICD-10-CM | POA: Diagnosis not present

## 2023-02-17 DIAGNOSIS — J441 Chronic obstructive pulmonary disease with (acute) exacerbation: Secondary | ICD-10-CM | POA: Insufficient documentation

## 2023-02-17 DIAGNOSIS — R0989 Other specified symptoms and signs involving the circulatory and respiratory systems: Secondary | ICD-10-CM | POA: Diagnosis not present

## 2023-02-17 DIAGNOSIS — Z79899 Other long term (current) drug therapy: Secondary | ICD-10-CM | POA: Diagnosis not present

## 2023-02-17 DIAGNOSIS — Z20822 Contact with and (suspected) exposure to covid-19: Secondary | ICD-10-CM | POA: Diagnosis not present

## 2023-02-17 DIAGNOSIS — F10930 Alcohol use, unspecified with withdrawal, uncomplicated: Secondary | ICD-10-CM | POA: Diagnosis not present

## 2023-02-17 LAB — CBC WITH DIFFERENTIAL/PLATELET
Abs Immature Granulocytes: 0.01 10*3/uL (ref 0.00–0.07)
Basophils Absolute: 0.1 10*3/uL (ref 0.0–0.1)
Basophils Relative: 2 %
Eosinophils Absolute: 0.1 10*3/uL (ref 0.0–0.5)
Eosinophils Relative: 2 %
HCT: 34.5 % — ABNORMAL LOW (ref 39.0–52.0)
Hemoglobin: 12.5 g/dL — ABNORMAL LOW (ref 13.0–17.0)
Immature Granulocytes: 0 %
Lymphocytes Relative: 20 %
Lymphs Abs: 0.7 10*3/uL (ref 0.7–4.0)
MCH: 36 pg — ABNORMAL HIGH (ref 26.0–34.0)
MCHC: 36.2 g/dL — ABNORMAL HIGH (ref 30.0–36.0)
MCV: 99.4 fL (ref 80.0–100.0)
Monocytes Absolute: 0.5 10*3/uL (ref 0.1–1.0)
Monocytes Relative: 15 %
Neutro Abs: 2.3 10*3/uL (ref 1.7–7.7)
Neutrophils Relative %: 61 %
Platelets: 158 10*3/uL (ref 150–400)
RBC: 3.47 MIL/uL — ABNORMAL LOW (ref 4.22–5.81)
RDW: 14.9 % (ref 11.5–15.5)
WBC: 3.7 10*3/uL — ABNORMAL LOW (ref 4.0–10.5)
nRBC: 0 % (ref 0.0–0.2)

## 2023-02-17 LAB — COMPREHENSIVE METABOLIC PANEL
ALT: 47 U/L — ABNORMAL HIGH (ref 0–44)
AST: 84 U/L — ABNORMAL HIGH (ref 15–41)
Albumin: 4 g/dL (ref 3.5–5.0)
Alkaline Phosphatase: 72 U/L (ref 38–126)
Anion gap: 18 — ABNORMAL HIGH (ref 5–15)
BUN: <5 mg/dL — ABNORMAL LOW (ref 6–20)
CO2: 22 mmol/L (ref 22–32)
Calcium: 9 mg/dL (ref 8.9–10.3)
Chloride: 96 mmol/L — ABNORMAL LOW (ref 98–111)
Creatinine, Ser: 0.64 mg/dL (ref 0.61–1.24)
GFR, Estimated: >60 mL/min (ref >60)
Glucose, Bld: 95 mg/dL (ref 70–99)
Potassium: 3.5 mmol/L (ref 3.5–5.1)
Sodium: 136 mmol/L (ref 135–145)
Total Bilirubin: 0.5 mg/dL (ref 0.3–1.2)
Total Protein: 7.3 g/dL (ref 6.5–8.1)

## 2023-02-17 LAB — TROPONIN I (HIGH SENSITIVITY)
Troponin I (High Sensitivity): 6 ng/L (ref <18)
Troponin I (High Sensitivity): 7 ng/L (ref <18)

## 2023-02-17 LAB — RESP PANEL BY RT-PCR (RSV, FLU A&B, COVID)  RVPGX2
Influenza A by PCR: NEGATIVE
Influenza B by PCR: NEGATIVE
Resp Syncytial Virus by PCR: NEGATIVE
SARS Coronavirus 2 by RT PCR: NEGATIVE

## 2023-02-17 LAB — RAPID URINE DRUG SCREEN, HOSP PERFORMED
Amphetamines: NOT DETECTED
Barbiturates: NOT DETECTED
Benzodiazepines: NOT DETECTED
Cocaine: NOT DETECTED
Opiates: NOT DETECTED
Tetrahydrocannabinol: NOT DETECTED

## 2023-02-17 LAB — ETHANOL: Alcohol, Ethyl (B): 217 mg/dL — ABNORMAL HIGH (ref <10)

## 2023-02-17 MED ORDER — THIAMINE HCL 100 MG/ML IJ SOLN: 100.0000 mg | Freq: Every day | INTRAMUSCULAR | Status: DC

## 2023-02-17 MED ORDER — ADULT MULTIVITAMIN W/MINERALS CH
1.0000 | ORAL_TABLET | Freq: Once | ORAL | Status: AC
  Administered 2023-02-17: 1 via ORAL
  Filled 2023-02-17: qty 1

## 2023-02-17 MED ORDER — FOLIC ACID 1 MG PO TABS
1.0000 mg | ORAL_TABLET | Freq: Once | ORAL | Status: AC
  Administered 2023-02-17: 1 mg via ORAL
  Filled 2023-02-17: qty 1

## 2023-02-17 MED ORDER — IPRATROPIUM-ALBUTEROL 0.5-2.5 (3) MG/3ML IN SOLN
6.0000 mL | Freq: Once | RESPIRATORY_TRACT | Status: AC
  Administered 2023-02-17: 6 mL via RESPIRATORY_TRACT
  Filled 2023-02-17: qty 3

## 2023-02-17 MED ORDER — THIAMINE MONONITRATE 100 MG PO TABS: 100.0000 mg | ORAL_TABLET | Freq: Every day | ORAL | Status: DC

## 2023-02-17 MED ORDER — CHLORDIAZEPOXIDE HCL 25 MG PO CAPS: ORAL_CAPSULE | ORAL | 0 refills | Status: DC

## 2023-02-17 MED ORDER — PREDNISONE 10 MG PO TABS: 40.0000 mg | ORAL_TABLET | Freq: Every day | ORAL | 0 refills | Status: AC

## 2023-02-17 MED ORDER — THIAMINE MONONITRATE 100 MG PO TABS
100.0000 mg | ORAL_TABLET | Freq: Once | ORAL | Status: AC
  Administered 2023-02-17: 100 mg via ORAL
  Filled 2023-02-17: qty 1

## 2023-02-17 MED ORDER — LORAZEPAM 1 MG PO TABS: 0.0000 mg | ORAL_TABLET | Freq: Two times a day (BID) | ORAL | Status: DC

## 2023-02-17 MED ORDER — PREDNISONE 20 MG PO TABS
60.0000 mg | ORAL_TABLET | Freq: Once | ORAL | Status: AC
  Administered 2023-02-17: 60 mg via ORAL
  Filled 2023-02-17: qty 3

## 2023-02-17 MED ORDER — LORAZEPAM 1 MG PO TABS
0.0000 mg | ORAL_TABLET | Freq: Four times a day (QID) | ORAL | Status: DC
  Administered 2023-02-17: 2 mg via ORAL
  Filled 2023-02-17: qty 2

## 2023-02-17 NOTE — ED Provider Notes (Signed)
[place] EMERGENCY DEPARTMENT AT [facility] Provider Note   HPI: [name] is a 57 year old male with a past medical history as below presenting today with shortness of breath.  He reports he typically drinks daily alcohol and has 6 to 8 40 ounces of beer a day.  He reports today he only had 2 40 ounce beers.  He reports he went to sleep after having the beers woke up short of breath.  He denies having any chest pain but reports he just felt off and not right therefore he called EMS to take him to the hospital.  He endorses a history of alcohol withdrawal however denies any history of seizures.  He does endorse a history of delirium tremens and has had hallucinations with alcohol withdrawal in the past.  He denies any hallucinations at present.  He denies abdominal pain.  He denies any diaphoresis at present.  He reports he is having nausea with vomiting and diarrhea.  He endorses tremors.  He reports he would like to stop drinking an entire program.  Past Medical History:  Diagnosis Date   Alcohol abuse    Allergy    Anxiety    Arthritis    Asthma    COPD (chronic obstructive pulmonary disease) (HCC)    Depression    Hypertension    Shortness of breath     Past Surgical History:  Procedure Laterality Date   BACK SURGERY     BIOPSY  06/02/2019   Procedure: BIOPSY;  Surgeon: Kathi Der, MD;  Location: [facility];  Service: Gastroenterology;;   COLONOSCOPY WITH PROPOFOL N/A 06/02/2019   Procedure: COLONOSCOPY WITH PROPOFOL;  Surgeon: Kathi Der, MD;  Location: [facility];  Service: Gastroenterology;  Laterality: N/A;   ESOPHAGOGASTRODUODENOSCOPY (EGD) WITH PROPOFOL N/A 06/02/2019   Procedure: ESOPHAGOGASTRODUODENOSCOPY (EGD) WITH PROPOFOL;  Surgeon: Kathi Der, MD;  Location: [facility];  Service: Gastroenterology;  Laterality: N/A;   HEMORRHOID SURGERY     HERNIA REPAIR     MANDIBLE FRACTURE SURGERY     POLYPECTOMY  06/02/2019    Procedure: POLYPECTOMY;  Surgeon: Kathi Der, MD;  Location: [facility];  Service: Gastroenterology;;     Social History   Tobacco Use   Smoking status: Every Day    Packs/day: 0.50    Years: 35.00    Additional pack years: 0.00    Total pack years: 17.50    Types: Cigarettes   Smokeless tobacco: Never   Tobacco comments:    States he smokes about 10 cigarettes a day. Explains he is not ready to quit.  Vaping Use   Vaping Use: Never used  Substance Use Topics   Alcohol use: Yes    Alcohol/week: 6.0 standard drinks of alcohol    Types: 6 Cans of beer per week    Comment: patient states he drinks 3-4 40 ounce bottles of beer daily. Patient states this is less than before and he does not want to try to quit. He will continue to cut down on the amount   Drug use: No      Review of Systems  A complete ROS was performed with pertinent positives/negatives noted in the HPI.   There were no vitals filed for this visit.  Physical Exam Vitals and nursing note reviewed.  Constitutional:      General: He is not in acute distress.    Appearance: He is well-developed.  Cardiovascular:     Rate and Rhythm: Normal rate and regular rhythm.  Pulses: Normal pulses.     Heart sounds: Normal heart sounds. No murmur heard.    No friction rub. No gallop.  Pulmonary:     Effort: Pulmonary effort is normal. No respiratory distress.     Breath sounds: No stridor. Wheezing present. No rhonchi or rales.  Abdominal:     General: Abdomen is flat. There is no distension.     Palpations: Abdomen is soft.     Tenderness: There is no abdominal tenderness. There is no guarding or rebound.  Musculoskeletal:        General: No swelling.  Skin:    General: Skin is warm and dry.     Capillary Refill: Capillary refill takes less than 2 seconds.  Neurological:     Mental Status: He is alert.     GCS: GCS eye subscore is 4. GCS verbal subscore is 5. GCS motor subscore is 6.     Motor:  Tremor present.  Psychiatric:        Mood and Affect: Mood normal.     Procedures  MDM:  Imaging/radiology results:  No results found.  EKG results:   Lab results:  No results found for this or any previous visit (from the past 24 hour(s)).  Key medications administered in the ER:  Medications  ipratropium-albuterol (DUONEB) 0.5-2.5 (3) MG/3ML nebulizer solution 6 mL (has no administration in time range)  LORazepam (ATIVAN) tablet 0-4 mg (2 mg Oral Given 02/17/23 1703)  LORazepam (ATIVAN) tablet 0-4 mg (has no administration in time range)  multivitamin with minerals tablet 1 tablet (has no administration in time range)  thiamine (VITAMIN B1) tablet 100 mg (has no administration in time range)  predniSONE (DELTASONE) tablet 60 mg (60 mg Oral Given 02/17/23 1704)  folic acid (FOLVITE) tablet 1 mg (1 mg Oral Given 02/17/23 1704)    Medical decision making: -Vital signs show tachycardia and hypertension stable. Patient afebrile, hemodynamically stable, and non-toxic appearing. -Patient's presentation is most consistent with acute presentation with potential threat to life or bodily function.. [name] is a 57 y.o. male presenting to the emergency department with shortness of breath and concerns of alcohol withdrawal.  -Per chart review, patient has a history of COPD and alcohol withdrawal.  He has required admissions in the past. -Patient is presenting with concerns of alcohol withdrawal.  He has consumed much less alcohol than is his typical.  He endorses tremors which are present on exam.  He is tachycardic and hypertensive overall consistent with alcohol withdrawal.  CIWA protocol ordered including treatment with Ativan.  Patient is also complaining of shortness of breath.  Will proceed with cardiac workup.  He is having wheezing on exam and endorsing a cough productive of sputum consistent with COPD exacerbation.  Will give breathing treatments and steroid. -Initial troponin  6. Second troponin 7. Delta troponin 1. EKG I obtained reveals no anatomical ischemia representing STEMI, new onset arrythmia, or ischemic equivalent. Therefore do not suspect ACS at this time. CXR unremarkable for focal airspace disease, patient is afebrile, no cough, do not suspect Pneumonia. CXR without evidence of Pneumothorax. No concerns for Pericardial Tamponade on EKG and in light of patients hemodynamic stability doubt this pathology. No pain related to supine or prone positions and given EKG doubt Pericarditis. Unlikely myocarditis, does not fit clinical picture, no EKG findings to support. Unlikely Pulmonary Embolism as well's score, low probability with presentation more consistent with COPD exacerbation. Therefore will not obtain CTA Chest or D-dimer. No lower  extremity edema, CXR without pulmonary edema, doubt CHF exacerbation.  Initial CIWA is elevated to 7, patient given Ativan.  Upon reassessment, patient's CIWA is down to a 2.  His vitals have improved and he is less tremulous.  He feels much improved.  From a breathing perspective his lungs have cleared up.  I believe the patient is stable for discharge at this time.  Discussed treatment with prednisone for COPD exacerbation.  Discussed Librium taper.  Instructed patient to withhold any alcohol consumption while on this medication and to discard this medication should he go back to drinking alcohol.  Patient reports he will follow-up with alcohol abuse resources clinics.  I have provided information on these clinics locally.  Patient discharged.    Medical Decision Making Amount and/or Complexity of Data Reviewed Labs: ordered. Decision-making details documented in ED Course. Radiology: ordered and independent interpretation performed. Decision-making details documented in ED Course. ECG/medicine tests: ordered and independent interpretation performed. Decision-making details documented in ED Course.  Risk OTC drugs. Prescription drug  management.     The plan for this patient was discussed with Dr. Rush Landmark, who voiced agreement and who oversaw evaluation and treatment of this patient.  Marta Lamas, MD Emergency Medicine, PGY-3  Note: Dragon medical dictation software was used in the creation of this note.   Clinical Impression:  1. Chronic obstructive pulmonary disease with acute exacerbation (HCC)   2. Alcohol withdrawal syndrome without complication (HCC)     Rx / DC Orders ED Discharge Orders          Ordered    chlordiazePOXIDE (LIBRIUM) 25 MG capsule        02/17/23 2225    predniSONE (DELTASONE) 10 MG tablet  Daily        02/17/23 2225               Chase Caller, MD 02/17/23 2227    Tegeler, Canary Brim, MD 02/19/23 0004

## 2023-02-17 NOTE — ED Notes (Signed)
Food given 

## 2023-02-17 NOTE — ED Triage Notes (Signed)
Pt coming from home, called out due to starting to feel like he's going to start shaking from alcoholic withdrawal. C/O shortness of breath but vitals are stable.  Only drank 2 40oz today.   8 to 10 40oz a day.  132/82 88HR 98 room air CBG 163

## 2023-02-17 NOTE — Discharge Instructions (Addendum)
[name]:  Thank you for allowing [place] to take care of you today.  We hope you begin feeling better soon.  To-Do: Please follow-up with your primary doctor. Complete your Librium taper as prescribed until you follow-up with your substance abuse clinic.  Do not drink alcohol while you are taking Librium.  If you go back to drinking alcohol please discard this prescription.  Taking both alcohol and Librium together can be life-threatening. Please return to the Emergency Department or call 911 if you experience chest pain, shortness of breath, severe pain, severe fever, altered mental status, or have any reason to think that you need emergency medical care.  Thank you again.  Hope you feel better soon.  Department of Emergency Medicine [facility]

## 2023-04-10 DIAGNOSIS — E782 Mixed hyperlipidemia: Secondary | ICD-10-CM | POA: Diagnosis not present

## 2023-04-10 DIAGNOSIS — J449 Chronic obstructive pulmonary disease, unspecified: Secondary | ICD-10-CM | POA: Diagnosis not present

## 2023-04-10 DIAGNOSIS — Z720 Tobacco use: Secondary | ICD-10-CM | POA: Diagnosis not present

## 2023-04-10 DIAGNOSIS — E538 Deficiency of other specified B group vitamins: Secondary | ICD-10-CM | POA: Diagnosis not present

## 2023-04-10 DIAGNOSIS — D539 Nutritional anemia, unspecified: Secondary | ICD-10-CM | POA: Diagnosis not present

## 2023-04-10 DIAGNOSIS — R7401 Elevation of levels of liver transaminase levels: Secondary | ICD-10-CM | POA: Diagnosis not present

## 2023-04-10 DIAGNOSIS — E559 Vitamin D deficiency, unspecified: Secondary | ICD-10-CM | POA: Diagnosis not present

## 2023-04-10 DIAGNOSIS — E871 Hypo-osmolality and hyponatremia: Secondary | ICD-10-CM | POA: Diagnosis not present

## 2023-04-10 DIAGNOSIS — R0609 Other forms of dyspnea: Secondary | ICD-10-CM | POA: Diagnosis not present

## 2023-05-03 ENCOUNTER — Ambulatory Visit: Payer: 59 | Attending: Internal Medicine | Admitting: Physical Therapy

## 2023-06-07 DIAGNOSIS — Z720 Tobacco use: Secondary | ICD-10-CM | POA: Diagnosis not present

## 2023-06-07 DIAGNOSIS — E559 Vitamin D deficiency, unspecified: Secondary | ICD-10-CM | POA: Diagnosis not present

## 2023-06-07 DIAGNOSIS — E782 Mixed hyperlipidemia: Secondary | ICD-10-CM | POA: Diagnosis not present

## 2023-06-07 DIAGNOSIS — R202 Paresthesia of skin: Secondary | ICD-10-CM | POA: Diagnosis not present

## 2023-06-07 DIAGNOSIS — Z0001 Encounter for general adult medical examination with abnormal findings: Secondary | ICD-10-CM | POA: Diagnosis not present

## 2023-06-07 DIAGNOSIS — J449 Chronic obstructive pulmonary disease, unspecified: Secondary | ICD-10-CM | POA: Diagnosis not present

## 2023-06-07 DIAGNOSIS — E538 Deficiency of other specified B group vitamins: Secondary | ICD-10-CM | POA: Diagnosis not present

## 2023-06-07 DIAGNOSIS — Z23 Encounter for immunization: Secondary | ICD-10-CM | POA: Diagnosis not present

## 2024-02-21 ENCOUNTER — Emergency Department (HOSPITAL_COMMUNITY)

## 2024-02-21 ENCOUNTER — Other Ambulatory Visit: Payer: Self-pay

## 2024-02-21 ENCOUNTER — Inpatient Hospital Stay (HOSPITAL_COMMUNITY)
Admission: EM | Admit: 2024-02-21 | Discharge: 2024-02-27 | DRG: 896 | Disposition: A | Discharge status: Home Health | Attending: Internal Medicine | Admitting: Internal Medicine

## 2024-02-21 ENCOUNTER — Encounter (HOSPITAL_COMMUNITY): Payer: Self-pay | Admitting: Emergency Medicine

## 2024-02-21 DIAGNOSIS — E878 Other disorders of electrolyte and fluid balance, not elsewhere classified: Secondary | ICD-10-CM | POA: Diagnosis not present

## 2024-02-21 DIAGNOSIS — E876 Hypokalemia: Secondary | ICD-10-CM | POA: Diagnosis present

## 2024-02-21 DIAGNOSIS — D509 Iron deficiency anemia, unspecified: Secondary | ICD-10-CM | POA: Diagnosis present

## 2024-02-21 DIAGNOSIS — E871 Hypo-osmolality and hyponatremia: Secondary | ICD-10-CM | POA: Diagnosis present

## 2024-02-21 DIAGNOSIS — R0602 Shortness of breath: Secondary | ICD-10-CM | POA: Diagnosis present

## 2024-02-21 DIAGNOSIS — E872 Acidosis, unspecified: Secondary | ICD-10-CM | POA: Diagnosis present

## 2024-02-21 DIAGNOSIS — F17210 Nicotine dependence, cigarettes, uncomplicated: Secondary | ICD-10-CM | POA: Diagnosis present

## 2024-02-21 DIAGNOSIS — Z8249 Family history of ischemic heart disease and other diseases of the circulatory system: Secondary | ICD-10-CM

## 2024-02-21 DIAGNOSIS — J9601 Acute respiratory failure with hypoxia: Secondary | ICD-10-CM | POA: Diagnosis present

## 2024-02-21 DIAGNOSIS — F10939 Alcohol use, unspecified with withdrawal, unspecified: Secondary | ICD-10-CM | POA: Diagnosis present

## 2024-02-21 DIAGNOSIS — Z7951 Long term (current) use of inhaled steroids: Secondary | ICD-10-CM | POA: Diagnosis not present

## 2024-02-21 DIAGNOSIS — Z1152 Encounter for screening for COVID-19: Secondary | ICD-10-CM | POA: Diagnosis not present

## 2024-02-21 DIAGNOSIS — Z791 Long term (current) use of non-steroidal anti-inflammatories (NSAID): Secondary | ICD-10-CM | POA: Diagnosis not present

## 2024-02-21 DIAGNOSIS — Z79899 Other long term (current) drug therapy: Secondary | ICD-10-CM

## 2024-02-21 DIAGNOSIS — E860 Dehydration: Secondary | ICD-10-CM | POA: Diagnosis present

## 2024-02-21 DIAGNOSIS — I10 Essential (primary) hypertension: Secondary | ICD-10-CM | POA: Diagnosis present

## 2024-02-21 DIAGNOSIS — J441 Chronic obstructive pulmonary disease with (acute) exacerbation: Principal | ICD-10-CM | POA: Diagnosis present

## 2024-02-21 DIAGNOSIS — F10231 Alcohol dependence with withdrawal delirium: Principal | ICD-10-CM | POA: Diagnosis present

## 2024-02-21 DIAGNOSIS — D649 Anemia, unspecified: Secondary | ICD-10-CM

## 2024-02-21 DIAGNOSIS — F10931 Alcohol use, unspecified with withdrawal delirium: Secondary | ICD-10-CM | POA: Diagnosis not present

## 2024-02-21 DIAGNOSIS — F10930 Alcohol use, unspecified with withdrawal, uncomplicated: Secondary | ICD-10-CM

## 2024-02-21 LAB — HEPATIC FUNCTION PANEL
ALT: 33 U/L (ref 0–44)
AST: 73 U/L — ABNORMAL HIGH (ref 15–41)
Albumin: 3.5 g/dL (ref 3.5–5.0)
Alkaline Phosphatase: 58 U/L (ref 38–126)
Bilirubin, Direct: <0.1 mg/dL (ref 0.0–0.2)
Total Bilirubin: 0.3 mg/dL (ref 0.0–1.2)
Total Protein: 7.3 g/dL (ref 6.5–8.1)

## 2024-02-21 LAB — BASIC METABOLIC PANEL WITH GFR
Anion gap: 14 (ref 5–15)
Anion gap: 18 — ABNORMAL HIGH (ref 5–15)
BUN: <5 mg/dL — ABNORMAL LOW (ref 6–20)
BUN: <5 mg/dL — ABNORMAL LOW (ref 6–20)
CO2: 22 mmol/L (ref 22–32)
CO2: 20 mmol/L — ABNORMAL LOW (ref 22–32)
Calcium: 8.7 mg/dL — ABNORMAL LOW (ref 8.9–10.3)
Calcium: 8.7 mg/dL — ABNORMAL LOW (ref 8.9–10.3)
Chloride: 89 mmol/L — ABNORMAL LOW (ref 98–111)
Chloride: 92 mmol/L — ABNORMAL LOW (ref 98–111)
Creatinine, Ser: 0.6 mg/dL — ABNORMAL LOW (ref 0.61–1.24)
Creatinine, Ser: 0.61 mg/dL (ref 0.61–1.24)
GFR, Estimated: >60 mL/min (ref >60)
GFR, Estimated: >60 mL/min (ref >60)
Glucose, Bld: 89 mg/dL (ref 70–99)
Glucose, Bld: 99 mg/dL (ref 70–99)
Potassium: 3.4 mmol/L — ABNORMAL LOW (ref 3.5–5.1)
Potassium: 3.2 mmol/L — ABNORMAL LOW (ref 3.5–5.1)
Sodium: 125 mmol/L — ABNORMAL LOW (ref 135–145)
Sodium: 130 mmol/L — ABNORMAL LOW (ref 135–145)

## 2024-02-21 LAB — URINALYSIS, ROUTINE W REFLEX MICROSCOPIC
Bilirubin Urine: NEGATIVE
Glucose, UA: NEGATIVE mg/dL
Hgb urine dipstick: NEGATIVE
Ketones, ur: NEGATIVE mg/dL
Leukocytes,Ua: NEGATIVE
Nitrite: NEGATIVE
Protein, ur: NEGATIVE mg/dL
Specific Gravity, Urine: 1.002 — ABNORMAL LOW (ref 1.005–1.030)
pH: 5 (ref 5.0–8.0)

## 2024-02-21 LAB — MAGNESIUM: Magnesium: 1.4 mg/dL — ABNORMAL LOW (ref 1.7–2.4)

## 2024-02-21 LAB — RAPID URINE DRUG SCREEN, HOSP PERFORMED
Amphetamines: NOT DETECTED
Barbiturates: NOT DETECTED
Benzodiazepines: NOT DETECTED
Cocaine: NOT DETECTED
Opiates: NOT DETECTED
Tetrahydrocannabinol: NOT DETECTED

## 2024-02-21 LAB — CBC
HCT: 23.3 % — ABNORMAL LOW (ref 39.0–52.0)
Hemoglobin: 7.9 g/dL — ABNORMAL LOW (ref 13.0–17.0)
MCH: 28.2 pg (ref 26.0–34.0)
MCHC: 33.9 g/dL (ref 30.0–36.0)
MCV: 83.2 fL (ref 80.0–100.0)
Platelets: 209 10*3/uL (ref 150–400)
RBC: 2.8 MIL/uL — ABNORMAL LOW (ref 4.22–5.81)
RDW: 17.7 % — ABNORMAL HIGH (ref 11.5–15.5)
WBC: 4.4 10*3/uL (ref 4.0–10.5)
nRBC: 0 % (ref 0.0–0.2)

## 2024-02-21 LAB — SODIUM, URINE, RANDOM: Sodium, Ur: <10 mmol/L

## 2024-02-21 LAB — PHOSPHORUS: Phosphorus: 5.3 mg/dL — ABNORMAL HIGH (ref 2.5–4.6)

## 2024-02-21 LAB — HIV ANTIBODY (ROUTINE TESTING W REFLEX): HIV Screen 4th Generation wRfx: NONREACTIVE

## 2024-02-21 LAB — IRON AND TIBC
Iron: 9 ug/dL — ABNORMAL LOW (ref 45–182)
Saturation Ratios: 2 % — ABNORMAL LOW (ref 17.9–39.5)
TIBC: 477 ug/dL — ABNORMAL HIGH (ref 250–450)
UIBC: 468 ug/dL

## 2024-02-21 LAB — FOLATE: Folate: 10.4 ng/mL (ref >5.9)

## 2024-02-21 LAB — LACTIC ACID, PLASMA
Lactic Acid, Venous: 4.3 mmol/L (ref 0.5–1.9)
Lactic Acid, Venous: 4.3 mmol/L (ref 0.5–1.9)

## 2024-02-21 LAB — OSMOLALITY: Osmolality: 339 mosm/kg (ref 275–295)

## 2024-02-21 LAB — RESP PANEL BY RT-PCR (RSV, FLU A&B, COVID)  RVPGX2
Influenza A by PCR: NEGATIVE
Influenza B by PCR: NEGATIVE
Resp Syncytial Virus by PCR: NEGATIVE
SARS Coronavirus 2 by RT PCR: NEGATIVE

## 2024-02-21 LAB — VITAMIN B12: Vitamin B-12: 811 pg/mL (ref 180–914)

## 2024-02-21 LAB — FERRITIN: Ferritin: 23 ng/mL — ABNORMAL LOW (ref 24–336)

## 2024-02-21 LAB — TRANSFERRIN: Transferrin: 341 mg/dL — ABNORMAL HIGH (ref 180–329)

## 2024-02-21 LAB — OCCULT BLOOD X 1 CARD TO LAB, STOOL: Fecal Occult Bld: NEGATIVE

## 2024-02-21 LAB — TROPONIN I (HIGH SENSITIVITY): Troponin I (High Sensitivity): 4 ng/L (ref <18)

## 2024-02-21 LAB — OSMOLALITY, URINE: Osmolality, Ur: 129 mosm/kg — ABNORMAL LOW (ref 300–900)

## 2024-02-21 MED ORDER — ONDANSETRON HCL 4 MG/2ML IJ SOLN: 4.0000 mg | Freq: Four times a day (QID) | INTRAMUSCULAR | Status: DC | PRN

## 2024-02-21 MED ORDER — SODIUM CHLORIDE 0.9 % IV BOLUS
500.0000 mL | Freq: Once | INTRAVENOUS | Status: AC
  Administered 2024-02-21: 500 mL via INTRAVENOUS

## 2024-02-21 MED ORDER — ALBUTEROL SULFATE (2.5 MG/3ML) 0.083% IN NEBU: 2.5000 mg | INHALATION_SOLUTION | RESPIRATORY_TRACT | Status: DC | PRN

## 2024-02-21 MED ORDER — POLYETHYLENE GLYCOL 3350 17 G PO PACK: 17.0000 g | PACK | Freq: Every day | ORAL | Status: DC | PRN

## 2024-02-21 MED ORDER — LORAZEPAM 1 MG PO TABS
1.0000 mg | ORAL_TABLET | ORAL | Status: DC | PRN
  Administered 2024-02-21 – 2024-02-22 (×4): 1 mg via ORAL
  Administered 2024-02-22: 4 mg via ORAL
  Filled 2024-02-21 (×4): qty 1
  Filled 2024-02-21: qty 4

## 2024-02-21 MED ORDER — LORAZEPAM 1 MG PO TABS
1.0000 mg | ORAL_TABLET | Freq: Once | ORAL | Status: AC
  Administered 2024-02-21: 1 mg via ORAL
  Filled 2024-02-21: qty 1

## 2024-02-21 MED ORDER — SODIUM CHLORIDE 0.9 % IV BOLUS: 1000.0000 mL | Freq: Once | INTRAVENOUS | Status: DC

## 2024-02-21 MED ORDER — ADULT MULTIVITAMIN W/MINERALS CH
1.0000 | ORAL_TABLET | Freq: Every day | ORAL | Status: DC
  Administered 2024-02-21 – 2024-02-27 (×7): 1 via ORAL
  Filled 2024-02-21 (×7): qty 1

## 2024-02-21 MED ORDER — THIAMINE MONONITRATE 100 MG PO TABS
100.0000 mg | ORAL_TABLET | Freq: Every day | ORAL | Status: DC
  Administered 2024-02-21 – 2024-02-24 (×3): 100 mg via ORAL
  Filled 2024-02-21 (×3): qty 1

## 2024-02-21 MED ORDER — LORAZEPAM 2 MG/ML IJ SOLN
1.0000 mg | INTRAMUSCULAR | Status: DC | PRN
  Administered 2024-02-21 (×2): 2 mg via INTRAVENOUS
  Administered 2024-02-21: 1 mg via INTRAVENOUS
  Administered 2024-02-22 (×2): 2 mg via INTRAVENOUS
  Administered 2024-02-22 – 2024-02-23 (×2): 1 mg via INTRAVENOUS
  Administered 2024-02-23: 2 mg via INTRAVENOUS
  Administered 2024-02-23: 4 mg via INTRAVENOUS
  Administered 2024-02-23: 3 mg via INTRAVENOUS
  Filled 2024-02-21 (×10): qty 1
  Filled 2024-02-21: qty 2

## 2024-02-21 MED ORDER — SODIUM CHLORIDE 0.9% FLUSH
3.0000 mL | Freq: Two times a day (BID) | INTRAVENOUS | Status: DC
  Administered 2024-02-21 – 2024-02-27 (×11): 3 mL via INTRAVENOUS

## 2024-02-21 MED ORDER — METHYLPREDNISOLONE SODIUM SUCC 125 MG IJ SOLR
125.0000 mg | INTRAMUSCULAR | Status: DC
  Administered 2024-02-22: 125 mg via INTRAVENOUS
  Filled 2024-02-21: qty 2

## 2024-02-21 MED ORDER — THIAMINE HCL 100 MG/ML IJ SOLN
100.0000 mg | Freq: Every day | INTRAMUSCULAR | Status: DC
  Administered 2024-02-23 – 2024-02-26 (×3): 100 mg via INTRAVENOUS
  Filled 2024-02-21 (×3): qty 2

## 2024-02-21 MED ORDER — IPRATROPIUM-ALBUTEROL 0.5-2.5 (3) MG/3ML IN SOLN
3.0000 mL | Freq: Four times a day (QID) | RESPIRATORY_TRACT | Status: DC | PRN
  Filled 2024-02-21: qty 3

## 2024-02-21 MED ORDER — MONTELUKAST SODIUM 10 MG PO TABS
10.0000 mg | ORAL_TABLET | Freq: Every day | ORAL | Status: DC
  Administered 2024-02-21 – 2024-02-26 (×6): 10 mg via ORAL
  Filled 2024-02-21 (×6): qty 1

## 2024-02-21 MED ORDER — ALBUTEROL SULFATE (2.5 MG/3ML) 0.083% IN NEBU
10.0000 mg/h | INHALATION_SOLUTION | Freq: Once | RESPIRATORY_TRACT | Status: DC
  Filled 2024-02-21: qty 3

## 2024-02-21 MED ORDER — PANTOPRAZOLE SODIUM 40 MG PO TBEC
40.0000 mg | DELAYED_RELEASE_TABLET | Freq: Every day | ORAL | Status: DC
  Administered 2024-02-21 – 2024-02-27 (×6): 40 mg via ORAL
  Filled 2024-02-21 (×7): qty 1

## 2024-02-21 MED ORDER — ACETAMINOPHEN 500 MG PO TABS: 1000.0000 mg | ORAL_TABLET | Freq: Four times a day (QID) | ORAL | Status: DC | PRN

## 2024-02-21 MED ORDER — ALBUTEROL SULFATE (2.5 MG/3ML) 0.083% IN NEBU
10.0000 mg/h | INHALATION_SOLUTION | Freq: Once | RESPIRATORY_TRACT | Status: AC
  Administered 2024-02-21: 10 mg/h via RESPIRATORY_TRACT
  Filled 2024-02-21: qty 12

## 2024-02-21 MED ORDER — PREDNISONE 20 MG PO TABS
60.0000 mg | ORAL_TABLET | Freq: Once | ORAL | Status: AC
  Administered 2024-02-21: 60 mg via ORAL
  Filled 2024-02-21: qty 3

## 2024-02-21 MED ORDER — SODIUM CHLORIDE 0.9 % IV SOLN
1.0000 g | INTRAVENOUS | Status: AC
  Administered 2024-02-21 – 2024-02-25 (×5): 1 g via INTRAVENOUS
  Filled 2024-02-21 (×5): qty 10

## 2024-02-21 MED ORDER — FOLIC ACID 1 MG PO TABS
1.0000 mg | ORAL_TABLET | Freq: Every day | ORAL | Status: DC
  Administered 2024-02-21 – 2024-02-27 (×7): 1 mg via ORAL
  Filled 2024-02-21 (×7): qty 1

## 2024-02-21 MED ORDER — MELATONIN 3 MG PO TABS
6.0000 mg | ORAL_TABLET | Freq: Every evening | ORAL | Status: DC | PRN
  Administered 2024-02-21 – 2024-02-24 (×3): 6 mg via ORAL
  Filled 2024-02-21 (×5): qty 2

## 2024-02-21 NOTE — ED Triage Notes (Signed)
 Patient arrives via [facility] from shortness of breath that started tonight. Hx of COPD - no inhaler at home. Fire found him at 86% on room air - improved to 96% with 3L. Also complaining of chronic chest pain. 20G LAC.

## 2024-02-21 NOTE — ED Notes (Signed)
 Pt ambulated without O2; pt O2 remained within normal limits

## 2024-02-21 NOTE — Discharge Instructions (Signed)

## 2024-02-21 NOTE — ED Notes (Signed)
 XR at bedside

## 2024-02-21 NOTE — H&P (Signed)
 History and Physical    Patient: [name] FMW:[id number] DOB: 09-01-1965 DOA: 02/21/2024 DOS: the patient was seen and examined on 02/21/2024 PCP: Pcp, No  Patient coming from: Home  Chief Complaint:  Chief Complaint  Patient presents with   Shortness of Breath   HPI: [name] is a 58 y.o. male with medical history significant of asthma/COPD, alcohol  and tobacco abuse, anxiety and depression, and HTN p/w AHRF 2/2 AECOPD c/b alcohol  withdrawal.  Pt is a poor historian. From what I can gather from limited interview and Epic chart review, the patient called EMS yesterday at 2100 after being acutely SOB iso a coughing spell that would not relent. Pt reports being off all his meds for days, including his normal inhalers.   Of note, pt was admitted for similar presentation in 09/2019 and required ICU admission with precedex  and phenobarbital  prior to discharge.  In the ED, pt tachycardic. Labs notable for Na 130, K 3.2, Mg 1.4, Phos 5.3, lactic acid 4.3, serum osm 339, Fe 9, % sat 2, and TIBC 477. Pt admitted to medicine for ongoing care.   Review of Systems: As mentioned in the history of present illness. All other systems reviewed and are negative. Past Medical History:  Diagnosis Date   Alcohol  abuse    Allergy    Anxiety    Arthritis    Asthma    COPD (chronic obstructive pulmonary disease) (HCC)    Depression    Hypertension    Shortness of breath    Past Surgical History:  Procedure Laterality Date   BACK SURGERY     BIOPSY  06/02/2019   Procedure: BIOPSY;  Surgeon: Elicia Claw, MD;  Location: [facility];  Service: Gastroenterology;;   COLONOSCOPY WITH PROPOFOL  N/A 06/02/2019   Procedure: COLONOSCOPY WITH PROPOFOL ;  Surgeon: Elicia Claw, MD;  Location: [facility];  Service: Gastroenterology;  Laterality: N/A;   ESOPHAGOGASTRODUODENOSCOPY (EGD) WITH PROPOFOL  N/A 06/02/2019   Procedure: ESOPHAGOGASTRODUODENOSCOPY (EGD) WITH PROPOFOL ;  Surgeon:  Elicia Claw, MD;  Location: [facility];  Service: Gastroenterology;  Laterality: N/A;   HEMORRHOID SURGERY     HERNIA REPAIR     MANDIBLE FRACTURE SURGERY     POLYPECTOMY  06/02/2019   Procedure: POLYPECTOMY;  Surgeon: Elicia Claw, MD;  Location: [facility];  Service: Gastroenterology;;   Social History:  reports that he has been smoking cigarettes. He has a 17.5 pack-year smoking history. He has never used smokeless tobacco. He reports current alcohol  use of about 6.0 standard drinks of alcohol  per week. He reports that he does not use drugs.  No Known Allergies  Family History  Problem Relation Age of Onset   Hypertension Mother    Hypertension Father    Hypertension Brother     Prior to Admission medications   Medication Sig Start Date End Date Taking? Authorizing Provider  albuterol  (VENTOLIN  HFA) 108 (90 Base) MCG/ACT inhaler Inhale 2 puffs into the lungs every 4 (four) hours as needed for wheezing or shortness of breath. 08/27/22   Lenor Hollering, MD  Aspirin-Caffeine (BC FAST PAIN RELIEF PO) Take 1 packet by mouth every 6 (six) hours as needed (for headaches).    [provider]  folic acid  (FOLVITE ) 1 MG tablet 1 tab(s)    [provider]  meloxicam (MOBIC) 7.5 MG tablet Take 7.5 mg by mouth daily. 01/09/19   [provider]  meloxicam (MOBIC) 7.5 MG tablet 1-2 tab(s)    [provider]  pantoprazole  (PROTONIX ) 40 MG  tablet Take 1 tablet (40 mg total) by mouth daily. 06/02/19 06/01/20  Elicia Claw, MD  SYMBICORT  160-4.5 MCG/ACT inhaler SMARTSIG:2 Puff(s) By Mouth Twice Daily 08/27/22   Lenor Hollering, MD  thiamine  100 MG tablet Take 1 tablet (100 mg total) by mouth daily. 10/16/19   Gonfa, Taye T, MD  Vitamin D3 (VITAMIN D) 25 MCG tablet 1 tab(s)    [provider]    Physical Exam: Vitals:   02/21/24 0557 02/21/24 0700 02/21/24 0736 02/21/24 1000  BP:  98/72 (!) 139/97 106/71  Pulse:  98  (!) 107  Resp:  14  15   Temp: 98.5 F (36.9 C)     TempSrc: Oral     SpO2:  100%  95%  Weight:      Height:       General: Alert, oriented x3, resting comfortably in no acute distress Respiratory: Lungs clear to auscultation bilaterally with normal respiratory effort; no w/r/r Cardiovascular: Regular rate and rhythm w/o m/r/g   Data Reviewed:  Lab Results  Component Value Date   WBC 4.4 02/21/2024   HGB 7.9 (L) 02/21/2024   HCT 23.3 (L) 02/21/2024   MCV 83.2 02/21/2024   PLT 209 02/21/2024   Lab Results  Component Value Date   GLUCOSE 99 02/21/2024   CALCIUM 8.7 (L) 02/21/2024   NA 130 (L) 02/21/2024   K 3.2 (L) 02/21/2024   CO2 20 (L) 02/21/2024   CL 92 (L) 02/21/2024   BUN <5 (L) 02/21/2024   CREATININE 0.61 02/21/2024   Lab Results  Component Value Date   ALT 33 02/21/2024   AST 73 (H) 02/21/2024   ALKPHOS 58 02/21/2024   BILITOT 0.3 02/21/2024   Lab Results  Component Value Date   INR 0.93 01/20/2014    Radiology: [facility] Chest Port 1 View Result Date: 02/21/2024 CLINICAL DATA:  Shortness of breath. EXAM: PORTABLE CHEST 1 VIEW COMPARISON:  February 17, 2023 FINDINGS: The heart size and mediastinal contours are within normal limits. Trace atelectatic changes are seen within the bilateral lung bases. There is no evidence of acute infiltrate, pleural effusion or pneumothorax. The visualized skeletal structures are unremarkable. IMPRESSION: No acute cardiopulmonary disease. Electronically Signed   By: Suzen Dials M.D.   On: 02/21/2024 01:44    Assessment and Plan: 52M h/o asthma/COPD, alcohol  and tobacco abuse, anxiety and depression, and HTN p/w AHRF 2/2 AECOPD c/b alcohol  withdrawal.  AECOPD -RT consult for pulmonary toiletry; apprec eval/recs -IV CTX 1g daily x5 days -Continue IV prednisolone 125mg  BID for now iso AECOPD -Breo Ellipta daily in place of pta Symbicort /Spiriva; consider IP Pulm consult to optimize OP meds given frequent admissions -Start montelukast 10mg  at  bedtime -Incentive spirometry and flutter valve prn -Duonebs prn -Wean O2 as tolerated -Ambulatory pulse prior to d/c  EtoH withdrawal -CIWA w/ ativan  per protocol   Advance Care Planning:   Code Status: Full Code   Consults: N/A  Family Communication: N/A  Severity of Illness: The appropriate patient status for this patient is INPATIENT. Inpatient status is judged to be reasonable and necessary in order to provide the required intensity of service to ensure the patient's safety. The patient's presenting symptoms, physical exam findings, and initial radiographic and laboratory data in the context of their chronic comorbidities is felt to place them at high risk for further clinical deterioration. Furthermore, it is not anticipated that the patient will be medically stable for discharge from the hospital within 2 midnights of admission.   *  I certify that at the point of admission it is my clinical judgment that the patient will require inpatient hospital care spanning beyond 2 midnights from the point of admission due to high intensity of service, high risk for further deterioration and high frequency of surveillance required.*   ------- I spent 55 minutes reviewing previous labs/notes, obtaining separate history at the bedside, counseling/discussing the treatment plan outlined above, ordering medications/tests, and performing clinical documentation.  Author: Marsha Ada, MD 02/21/2024 2:04 PM  For on call review [email or web address].

## 2024-02-21 NOTE — Progress Notes (Signed)
 CSW added substance abuse resources to patient's AVS.  Edwin Dada, MSW, LCSW Transitions of Care  Clinical Social Worker II [phone]

## 2024-02-21 NOTE — ED Provider Notes (Signed)
 [facility] AT [facility] Provider Note   CSN: [id number] Arrival date & time: 02/21/24  0011     Patient presents with: Shortness of Breath   [name] is a 58 y.o. male.   The history is provided by the patient.  Patient with history of COPD, alcohol  use disorder presents with shortness of breath.  Patient reports he woke up with abruptly feeling short of breath, wheezing and cough.  No hemoptysis.  No new chest pain. EMS was called and he was noted to be hypoxic on room air but that improved with nasal cannula.  He is not on chronic oxygen at home Patient is a current smoker.  He reports he drinks every day, last alcohol  use was on July 2 He reports feeling tremulous    Past Medical History:  Diagnosis Date   Alcohol  abuse    Allergy    Anxiety    Arthritis    Asthma    COPD (chronic obstructive pulmonary disease) (HCC)    Depression    Hypertension    Shortness of breath     Prior to Admission medications   Medication Sig Start Date End Date Taking? Authorizing Provider  albuterol  (VENTOLIN  HFA) 108 (90 Base) MCG/ACT inhaler Inhale 2 puffs into the lungs every 4 (four) hours as needed for wheezing or shortness of breath. 08/27/22   Lenor Hollering, MD  Aspirin-Caffeine (BC FAST PAIN RELIEF PO) Take 1 packet by mouth every 6 (six) hours as needed (for headaches).    [provider]  folic acid  (FOLVITE ) 1 MG tablet 1 tab(s)    [provider]  meloxicam (MOBIC) 7.5 MG tablet Take 7.5 mg by mouth daily. 01/09/19   [provider]  meloxicam (MOBIC) 7.5 MG tablet 1-2 tab(s)    [provider]  pantoprazole  (PROTONIX ) 40 MG tablet Take 1 tablet (40 mg total) by mouth daily. 06/02/19 06/01/20  Elicia Claw, MD  SYMBICORT  160-4.5 MCG/ACT inhaler SMARTSIG:2 Puff(s) By Mouth Twice Daily 08/27/22   Lenor Hollering, MD  thiamine  100 MG tablet Take 1 tablet (100 mg total) by mouth daily. 10/16/19   Gonfa, Taye  T, MD  Vitamin D3 (VITAMIN D) 25 MCG tablet 1 tab(s)    [provider]    Allergies: Patient has no known allergies.    Review of Systems  Constitutional:  Negative for fever.  Respiratory:  Positive for cough and shortness of breath.     Updated Vital Signs BP 121/75   Pulse (!) 103   Temp 98 F (36.7 C) (Oral)   Resp 10   Ht 1.702 m (5' 7)   Wt 59.9 kg   SpO2 (!) 88%   BMI 20.67 kg/m   Physical Exam CONSTITUTIONAL: Chronically ill-appearing, tremulous HEAD: Normocephalic/atraumatic EYES: EOMI/PERRL ENMT: Mucous membranes moist NECK: supple no meningeal signs CV: S1/S2 noted, no murmurs/rubs/gallops noted LUNGS: Scattered wheezing bilaterally, on 4 L nasal cannula ABDOMEN: soft, nontender NEURO: Pt is awake/alert/appropriate, moves all extremitiesx4.  No facial droop.  Patient is tremulous but has a normal mental status EXTREMITIES: pulses normal/equal, full ROM SKIN: warm, color normal PSYCH: Mildly anxious  (all labs ordered are listed, but only abnormal results are displayed) Labs Reviewed  CBC - Abnormal; Notable for the following components:      Result Value   RBC 2.80 (*)    Hemoglobin 7.9 (*)    HCT 23.3 (*)    RDW 17.7 (*)    All other  components within normal limits  BASIC METABOLIC PANEL WITH GFR - Abnormal; Notable for the following components:   Sodium 125 (*)    Potassium 3.4 (*)    Chloride 89 (*)    BUN <5 (*)    Creatinine, Ser 0.60 (*)    Calcium 8.7 (*)    All other components within normal limits  RESP PANEL BY RT-PCR (RSV, FLU A&B, COVID)  [id number]  OSMOLALITY  OSMOLALITY, URINE  SODIUM, URINE, RANDOM  RAPID URINE DRUG SCREEN, HOSP PERFORMED  URINALYSIS, ROUTINE W REFLEX MICROSCOPIC  BASIC METABOLIC PANEL WITH GFR  MAGNESIUM   PHOSPHORUS  FERRITIN  IRON AND TIBC  TRANSFERRIN  VITAMIN B12  FOLATE  OCCULT BLOOD X 1 CARD TO LAB, STOOL  HIV ANTIBODY (ROUTINE TESTING W REFLEX)  LACTIC ACID, PLASMA  POC OCCULT BLOOD, ED   TROPONIN I (HIGH SENSITIVITY)    EKG: EKG Interpretation Date/Time:  Thursday February 21 2024 00:15:13 EDT Ventricular Rate:  84 PR Interval:  158 QRS Duration:  90 QT Interval:  398 QTC Calculation: 470 R Axis:   56  Text Interpretation: Normal sinus rhythm Normal ECG Interpretation limited secondary to artifact Confirmed by Midge Golas ([id number]) on 02/21/2024 1:13:51 AM  Radiology: [facility] Chest Port 1 View Result Date: 02/21/2024 CLINICAL DATA:  Shortness of breath. EXAM: PORTABLE CHEST 1 VIEW COMPARISON:  February 17, 2023 FINDINGS: The heart size and mediastinal contours are within normal limits. Trace atelectatic changes are seen within the bilateral lung bases. There is no evidence of acute infiltrate, pleural effusion or pneumothorax. The visualized skeletal structures are unremarkable. IMPRESSION: No acute cardiopulmonary disease. Electronically Signed   By: Suzen Dials M.D.   On: 02/21/2024 01:44     .Critical Care  Performed by: Midge Golas, MD Authorized by: Midge Golas, MD   Critical care provider statement:    Critical care time (minutes):  60   Critical care start time:  02/21/2024 3:15 AM   Critical care end time:  02/21/2024 4:15 AM   Critical care time was exclusive of:  Separately billable procedures and treating other patients   Critical care was necessary to treat or prevent imminent or life-threatening deterioration of the following conditions:  Respiratory failure and toxidrome   Critical care was time spent personally by me on the following activities:  Evaluation of patient's response to treatment, examination of patient, re-evaluation of patient's condition, pulse oximetry, ordering and review of radiographic studies, ordering and review of laboratory studies, ordering and performing treatments and interventions, review of old charts and development of treatment plan with patient or surrogate   I assumed direction of critical care for this patient from  another provider in my specialty: no     Care discussed with: admitting provider      Medications Ordered in the ED  sodium chloride  0.9 % bolus 500 mL (has no administration in time range)  LORazepam  (ATIVAN ) tablet 1-4 mg (has no administration in time range)    Or  LORazepam  (ATIVAN ) injection 1-4 mg (has no administration in time range)  thiamine  (VITAMIN B1) tablet 100 mg (has no administration in time range)    Or  thiamine  (VITAMIN B1) injection 100 mg (has no administration in time range)  folic acid  (FOLVITE ) tablet 1 mg (has no administration in time range)  multivitamin with minerals tablet 1 tablet (has no administration in time range)  sodium chloride  flush (NS) 0.9 % injection 3 mL (has no administration in time range)  acetaminophen  (TYLENOL ) tablet  1,000 mg (has no administration in time range)  albuterol  (PROVENTIL ) (2.5 MG/3ML) 0.083% nebulizer solution 2.5 mg (has no administration in time range)  melatonin tablet 6 mg (has no administration in time range)  ondansetron  (ZOFRAN ) injection 4 mg (has no administration in time range)  polyethylene glycol (MIRALAX / GLYCOLAX) packet 17 g (has no administration in time range)  LORazepam  (ATIVAN ) tablet 1 mg (1 mg Oral Given 02/21/24 0124)  predniSONE  (DELTASONE ) tablet 60 mg (60 mg Oral Given 02/21/24 0124)  albuterol  (PROVENTIL ) (2.5 MG/3ML) 0.083% nebulizer solution (10 mg/hr Nebulization Given 02/21/24 0148)  LORazepam  (ATIVAN ) tablet 1 mg (1 mg Oral Given 02/21/24 0350)    Clinical Course as of 02/21/24 0418  Thu Feb 21, 2024  0153 Patient presents for likely COPD exacerbation, he is already improving.  Patient also reports feeling tremulous and feels that he may be withdrawing.  Ativan  has been ordered [DW]  0317 Hemoglobin(!): 7.9 Acute anemia as noted   [DW]  0317 Patient reports he has had some blood in his stool.  With chaperone present I performed a rectal exam but there was no melena or blood. [DW]  9681 For his  respiratory issues, will ambulate & reassess [DW]  0347 Overall patient is improved from a respiratory status and he was able to ambulate without dyspnea, but he became tachycardic and continued to be tremulous.  I suspect patient is now developing alcohol  withdrawal syndrome.  Given his multiple acute issues including anemia, COPD exacerbation as well as alcohol  withdrawal patient would benefit from admission [DW]  0416 Discussed with Dr. Segars for admission [DW]  0417 Sodium(!): 125 Hyponatremia [DW]    Clinical Course User Index [DW] Midge Golas, MD                                 Medical Decision Making Amount and/or Complexity of Data Reviewed Labs: ordered. Decision-making details documented in ED Course. Radiology: ordered.  Risk Prescription drug management. Decision regarding hospitalization.   This patient presents to the ED for concern of shortness of breath, this involves an extensive number of treatment options, and is a complaint that carries with it a high risk of complications and morbidity.  The differential diagnosis includes but is not limited to Acute coronary syndrome, pneumonia, acute pulmonary edema, pneumothorax, acute anemia, pulmonary embolism, COPD    Comorbidities that complicate the patient evaluation: Patient's presentation is complicated by their history of COPD  Social Determinants of Health: Patient's lack of prescription access, impaired access to primary care, and tobacco and alcohol  use  increases the complexity of managing their presentation  Additional history obtained: Records reviewed previous admission documents  Lab Tests: I Ordered, and personally interpreted labs.  The pertinent results include: Hyponatremia  Imaging Studies ordered: I ordered imaging studies including X-ray chest  I independently visualized and interpreted imaging which showed no acute finding I agree with the radiologist interpretation  Cardiac  Monitoring: The patient was maintained on a cardiac monitor.  I personally viewed and interpreted the cardiac monitor which showed an underlying rhythm of:  sinus rhythm  Medicines ordered and prescription drug management: I ordered medication including albuterol  for shortness of breath Reevaluation of the patient after these medicines showed that the patient    improved  Critical Interventions:   admission for COPD and management of his alcohol  withdrawal and further exploration of his anemia  Consultations Obtained: I requested consultation with the  admitting physician [facility], and discussed  findings as well as pertinent plan - they recommend: Admit  Reevaluation: After the interventions noted above, I reevaluated the patient and found that they have :improved  Complexity of problems addressed: Patient's presentation is most consistent with  acute presentation with potential threat to life or bodily function  Disposition: After consideration of the diagnostic results and the patient's response to treatment,  I feel that the patent would benefit from admission  .        Final diagnoses:  COPD exacerbation (HCC)  Alcohol  withdrawal syndrome without complication [facility])  Acute anemia    ED Discharge Orders     None          Midge Golas, MD 02/21/24 [phone]

## 2024-02-21 NOTE — Plan of Care (Signed)
   Problem: Education: Goal: Knowledge of General Education information will improve Description Including pain rating scale, medication(s)/side effects and non-pharmacologic comfort measures Outcome: Progressing   Problem: Health Behavior/Discharge Planning: Goal: Ability to manage health-related needs will improve Outcome: Progressing

## 2024-02-21 NOTE — ED Notes (Addendum)
 Pt o2 dropped to 88% on room air. Pt placed back on 4L via nasal cannula. This paramedic remained in pt room until o2 went back to normal limits.

## 2024-02-22 DIAGNOSIS — J441 Chronic obstructive pulmonary disease with (acute) exacerbation: Secondary | ICD-10-CM

## 2024-02-22 DIAGNOSIS — F10931 Alcohol use, unspecified with withdrawal delirium: Secondary | ICD-10-CM

## 2024-02-22 LAB — COMPREHENSIVE METABOLIC PANEL WITH GFR
ALT: 29 U/L (ref 0–44)
AST: 50 U/L — ABNORMAL HIGH (ref 15–41)
Albumin: 3.8 g/dL (ref 3.5–5.0)
Alkaline Phosphatase: 61 U/L (ref 38–126)
Anion gap: 15 (ref 5–15)
BUN: 6 mg/dL (ref 6–20)
CO2: 22 mmol/L (ref 22–32)
Calcium: 9.4 mg/dL (ref 8.9–10.3)
Chloride: 101 mmol/L (ref 98–111)
Creatinine, Ser: 0.74 mg/dL (ref 0.61–1.24)
GFR, Estimated: >60 mL/min (ref >60)
Glucose, Bld: 116 mg/dL — ABNORMAL HIGH (ref 70–99)
Potassium: 3.6 mmol/L (ref 3.5–5.1)
Sodium: 138 mmol/L (ref 135–145)
Total Bilirubin: 0.9 mg/dL (ref 0.0–1.2)
Total Protein: 7.4 g/dL (ref 6.5–8.1)

## 2024-02-22 LAB — CBC
HCT: 24.2 % — ABNORMAL LOW (ref 39.0–52.0)
Hemoglobin: 7.9 g/dL — ABNORMAL LOW (ref 13.0–17.0)
MCH: 28 pg (ref 26.0–34.0)
MCHC: 32.6 g/dL (ref 30.0–36.0)
MCV: 85.8 fL (ref 80.0–100.0)
Platelets: 219 K/uL (ref 150–400)
RBC: 2.82 MIL/uL — ABNORMAL LOW (ref 4.22–5.81)
RDW: 18.6 % — ABNORMAL HIGH (ref 11.5–15.5)
WBC: 7.6 K/uL (ref 4.0–10.5)
nRBC: 0 % (ref 0.0–0.2)

## 2024-02-22 LAB — LACTIC ACID, PLASMA
Lactic Acid, Venous: 3 mmol/L (ref 0.5–1.9)
Lactic Acid, Venous: 4.4 mmol/L (ref 0.5–1.9)

## 2024-02-22 LAB — PHOSPHORUS: Phosphorus: 4 mg/dL (ref 2.5–4.6)

## 2024-02-22 LAB — MAGNESIUM: Magnesium: 2.1 mg/dL (ref 1.7–2.4)

## 2024-02-22 MED ORDER — CHLORDIAZEPOXIDE HCL 5 MG PO CAPS
10.0000 mg | ORAL_CAPSULE | Freq: Four times a day (QID) | ORAL | Status: AC
  Administered 2024-02-22 (×2): 10 mg via ORAL
  Filled 2024-02-22: qty 2
  Filled 2024-02-22: qty 1

## 2024-02-22 MED ORDER — CHLORDIAZEPOXIDE HCL 25 MG PO CAPS: 25.0000 mg | ORAL_CAPSULE | ORAL | Status: DC

## 2024-02-22 MED ORDER — CHLORDIAZEPOXIDE HCL 5 MG PO CAPS: 10.0000 mg | ORAL_CAPSULE | Freq: Three times a day (TID) | ORAL | Status: DC

## 2024-02-22 MED ORDER — POTASSIUM CHLORIDE CRYS ER 20 MEQ PO TBCR
40.0000 meq | EXTENDED_RELEASE_TABLET | ORAL | Status: DC
  Administered 2024-02-22: 40 meq via ORAL
  Filled 2024-02-22: qty 2

## 2024-02-22 MED ORDER — MAGNESIUM SULFATE 2 GM/50ML IV SOLN
2.0000 g | Freq: Once | INTRAVENOUS | Status: AC
  Administered 2024-02-22: 2 g via INTRAVENOUS
  Filled 2024-02-22: qty 50

## 2024-02-22 MED ORDER — NICOTINE 14 MG/24HR TD PT24
14.0000 mg | MEDICATED_PATCH | Freq: Every day | TRANSDERMAL | Status: DC
  Administered 2024-02-22 – 2024-02-24 (×3): 14 mg via TRANSDERMAL
  Filled 2024-02-22 (×6): qty 1

## 2024-02-22 MED ORDER — ONDANSETRON 4 MG PO TBDP: 4.0000 mg | ORAL_TABLET | Freq: Four times a day (QID) | ORAL | Status: DC | PRN

## 2024-02-22 MED ORDER — POTASSIUM CHLORIDE IN NACL 20-0.9 MEQ/L-% IV SOLN
INTRAVENOUS | Status: AC
  Filled 2024-02-22 (×2): qty 1000

## 2024-02-22 MED ORDER — LOPERAMIDE HCL 2 MG PO CAPS: 2.0000 mg | ORAL_CAPSULE | ORAL | Status: DC | PRN

## 2024-02-22 MED ORDER — CHLORDIAZEPOXIDE HCL 25 MG PO CAPS: 25.0000 mg | ORAL_CAPSULE | Freq: Three times a day (TID) | ORAL | Status: DC

## 2024-02-22 MED ORDER — SODIUM CHLORIDE 0.9 % IV SOLN
300.0000 mg | Freq: Once | INTRAVENOUS | Status: AC
  Administered 2024-02-22: 300 mg via INTRAVENOUS
  Filled 2024-02-22: qty 15

## 2024-02-22 MED ORDER — METHYLPREDNISOLONE SODIUM SUCC 40 MG IJ SOLR
40.0000 mg | INTRAMUSCULAR | Status: DC
  Administered 2024-02-23 – 2024-02-24 (×2): 40 mg via INTRAVENOUS
  Filled 2024-02-22 (×2): qty 1

## 2024-02-22 MED ORDER — UMECLIDINIUM-VILANTEROL 62.5-25 MCG/ACT IN AEPB
1.0000 | INHALATION_SPRAY | Freq: Every day | RESPIRATORY_TRACT | Status: DC
  Filled 2024-02-22: qty 14

## 2024-02-22 MED ORDER — CLONIDINE HCL 0.1 MG PO TABS
0.1000 mg | ORAL_TABLET | Freq: Three times a day (TID) | ORAL | Status: DC
  Administered 2024-02-22 (×2): 0.1 mg via ORAL
  Filled 2024-02-22 (×2): qty 1

## 2024-02-22 MED ORDER — CHLORDIAZEPOXIDE HCL 25 MG PO CAPS
25.0000 mg | ORAL_CAPSULE | Freq: Four times a day (QID) | ORAL | Status: DC
  Administered 2024-02-22 (×2): 25 mg via ORAL
  Filled 2024-02-22 (×2): qty 1

## 2024-02-22 MED ORDER — CHLORDIAZEPOXIDE HCL 25 MG PO CAPS: 25.0000 mg | ORAL_CAPSULE | Freq: Every day | ORAL | Status: DC

## 2024-02-22 MED ORDER — CHLORDIAZEPOXIDE HCL 5 MG PO CAPS: 10.0000 mg | ORAL_CAPSULE | ORAL | Status: DC

## 2024-02-22 MED ORDER — CHLORDIAZEPOXIDE HCL 5 MG PO CAPS
10.0000 mg | ORAL_CAPSULE | Freq: Every day | ORAL | Status: DC
  Filled 2024-02-22: qty 2

## 2024-02-22 MED ORDER — METOPROLOL TARTRATE 5 MG/5ML IV SOLN: 2.5000 mg | INTRAVENOUS | Status: DC | PRN

## 2024-02-22 NOTE — Progress Notes (Signed)
 cloNIDine  (CATAPRES ) tablet 0.1 mg given per order

## 2024-02-22 NOTE — Progress Notes (Signed)
 PROGRESS NOTE  [name] FMW:[id number] DOB: 05-23-1966   PCP: Pcp, No  Patient is from: Home.  DOA: 02/21/2024 LOS: 1  Chief complaints Chief Complaint  Patient presents with   Shortness of Breath     Brief Narrative / Interim history: 58 year old M with PMH of asthma/COPD, alcohol  and tobacco use disorder, anxiety, depression, alcohol  induced polyneuropathy brought to ED by EMS with shortness of breath and found to be hypoxic to 86% on RA when fire fighter's found him.  He was admitted with acute respiratory failure with hypoxia due to COPD exacerbation and alcohol  withdrawal.  In the ED, pt tachycardic. Na 130, K 3.2, Mg 1.4, Phos 5.3, lactic acid 4.3, serum osm 339, COVID-19, influenza and RSV PCR nonreactive.    Subjective: Seen and examined earlier this morning.  No major events overnight or this morning.  He is CIWA scores are relatively low in the range of 4-6 overnight.  However, he is disoriented and confused trying to get out of the bed.  He has some tremors.  Does not seem to have hallucination.  Follows commands.  Stable vitals.  Objective: Vitals:   02/22/24 0322 02/22/24 0539 02/22/24 0806 02/22/24 0807  BP: 136/88 (!) 116/95 139/81 139/81  Pulse: 93 98 99 99  Resp: 19 18 18    Temp: 98.5 F (36.9 C) 98.4 F (36.9 C) 98.1 F (36.7 C)   TempSrc: Oral Oral Oral   SpO2: 96% 99% 98%   Weight:      Height:        Examination:  GENERAL: No apparent distress.  Appears frail. HEENT: MMM.  Vision and hearing grossly intact.  NECK: Supple.  No apparent JVD.  RESP:  No IWOB.  Fair aeration bilaterally. CVS:  RRR. Heart sounds normal.  ABD/GI/GU: BS+. Abd soft, NTND.  MSK/EXT:  Moves extremities.  Significant muscle mass and subcu fat loss. SKIN: no apparent skin lesion or wound NEURO: Awake.  Disoriented.  Follows commands.  Tremors.  No apparent focal neuro deficit. PSYCH: Calm.  No distress or agitation.  Consultants:   None  Procedures: None  Microbiology summarized: COVID-19, influenza and RSV PCR nonreactive  Assessment and plan: Acute COPD exacerbation: Called EMS due to shortness of breath and found to be hypoxic to 86 when firefighters arrived.  Reportedly ran out of his inhalers.  Does not seem to have respiratory distress at the moment.  Saturating in upper 90s on room air.  CXR without acute finding.  No fever or leukocytosis. -Continue ceftriaxone , Singulair  and as needed DuoNebs -Decrease Solu-Medrol  to 40 mg daily starting tomorrow -Add Anoro Ellipta  -Encouraged smoking cessation when able to comprehend    [facility] withdrawal/confusion: History unclear but prior history of alcohol  withdrawal requiring ICU stay.  CIWA in the range of 4-6.  He is disoriented, confused and tremulous.  Hemodynamically stable.  -Add Librium  taper -Continue CIWA with as needed Ativan  -Multivitamin, thiamine  and folic acid  -Fall precaution and abdominal belt. - Reorientation and delirium precaution  Hyponatremia/hypokalemia/hypomagnesemia: Likely due to alcohol . - Check labs this morning and replenish as appropriate  Iron  deficiency anemia: Anemia panel with severe iron  deficiency Recent Labs    02/21/24 0127  HGB 7.9*  - IV Venofer  300 mg x 1  Tobacco use disorder - Nicotine  patch - Will counsel on cessation  Hyperphosphatemia: Mild - Recheck labs this morning  Lactic acidosis: Likely due to alcohol . - Continue IV fluid - Recheck this morning  Physical deconditioning - PT/OT  Body mass  index is 20.67 kg/m.          DVT prophylaxis:  SCDs Start: 02/21/24 0407  Code Status: Full code Family Communication: Attempted to call patient's sister, Orlean over the phone but no answer. Level of care: Telemetry Medical Status is: Inpatient Remains inpatient appropriate because: Alcohol  withdrawal   Final disposition: To be determined   55 minutes with more than 50% spent in reviewing  records, counseling patient/family and coordinating care.   Sch Meds:  Scheduled Meds:  chlordiazePOXIDE   25 mg Oral QID   Followed by   NOREEN ON 02/23/2024] chlordiazePOXIDE   25 mg Oral TID   Followed by   NOREEN ON 02/24/2024] chlordiazePOXIDE   25 mg Oral BH-qamhs   Followed by   NOREEN ON 02/25/2024] chlordiazePOXIDE   25 mg Oral Daily   folic acid   1 mg Oral Daily   [START ON 02/23/2024] methylPREDNISolone  (SOLU-MEDROL ) injection  40 mg Intravenous Q24H   montelukast   10 mg Oral QHS   multivitamin with minerals  1 tablet Oral Daily   nicotine   14 mg Transdermal Daily   pantoprazole   40 mg Oral Daily   sodium chloride  flush  3 mL Intravenous Q12H   thiamine   100 mg Oral Daily   Or   thiamine   100 mg Intravenous Daily   umeclidinium-vilanterol  1 puff Inhalation Daily   Continuous Infusions:  0.9 % NaCl with KCl 20 mEq / L 75 mL/hr at 02/22/24 1024   cefTRIAXone  (ROCEPHIN )  IV 1 g (02/21/24 1448)   PRN Meds:.acetaminophen , ipratropium-albuterol , loperamide , LORazepam  **OR** LORazepam , melatonin, ondansetron  (ZOFRAN ) IV, ondansetron , polyethylene glycol  Antimicrobials: Anti-infectives (From admission, onward)    Start     Dose/Rate Route Frequency Ordered Stop   02/21/24 1415  cefTRIAXone  (ROCEPHIN ) 1 g in sodium chloride  0.9 % 100 mL IVPB        1 g 200 mL/hr over 30 Minutes Intravenous Every 24 hours 02/21/24 1413 02/26/24 1359        I have personally reviewed the following labs and images: CBC: Recent Labs  Lab 02/21/24 0127  WBC 4.4  HGB 7.9*  HCT 23.3*  MCV 83.2  PLT 209   BMP &GFR Recent Labs  Lab 02/21/24 0127 02/21/24 0502  NA 125* 130*  K 3.4* 3.2*  CL 89* 92*  CO2 22 20*  GLUCOSE 89 99  BUN <5* <5*  CREATININE 0.60* 0.61  CALCIUM 8.7* 8.7*  MG  --  1.4*  PHOS  --  5.3*   Estimated Creatinine Clearance: 85.3 mL/min (by C-G formula based on SCr of 0.61 mg/dL). Liver & Pancreas: Recent Labs  Lab 02/21/24 0459  AST 73*  ALT 33  ALKPHOS 58   BILITOT 0.3  PROT 7.3  ALBUMIN 3.5   No results for input(s): LIPASE, AMYLASE in the last 168 hours. No results for input(s): AMMONIA in the last 168 hours. Diabetic: No results for input(s): HGBA1C in the last 72 hours. No results for input(s): GLUCAP in the last 168 hours. Cardiac Enzymes: No results for input(s): CKTOTAL, CKMB, CKMBINDEX, TROPONINI in the last 168 hours. No results for input(s): PROBNP in the last 8760 hours. Coagulation Profile: No results for input(s): INR, PROTIME in the last 168 hours. Thyroid  Function Tests: No results for input(s): TSH, T4TOTAL, FREET4, T3FREE, THYROIDAB in the last 72 hours. Lipid Profile: No results for input(s): CHOL, HDL, LDLCALC, TRIG, CHOLHDL, LDLDIRECT in the last 72 hours. Anemia Panel: Recent Labs    02/21/24 0502  VITAMINB12 811  FOLATE 10.4  FERRITIN 23*  TIBC 477*  IRON  9*   Urine analysis:    Component Value Date/Time   COLORURINE COLORLESS (A) 02/21/2024 0422   APPEARANCEUR CLEAR 02/21/2024 0422   LABSPEC 1.002 (L) 02/21/2024 0422   PHURINE 5.0 02/21/2024 0422   GLUCOSEU NEGATIVE 02/21/2024 0422   HGBUR NEGATIVE 02/21/2024 0422   BILIRUBINUR NEGATIVE 02/21/2024 0422   KETONESUR NEGATIVE 02/21/2024 0422   PROTEINUR NEGATIVE 02/21/2024 0422   UROBILINOGEN 0.2 06/19/2013 2025   NITRITE NEGATIVE 02/21/2024 0422   LEUKOCYTESUR NEGATIVE 02/21/2024 0422   Sepsis Labs: Invalid input(s): PROCALCITONIN, LACTICIDVEN  Microbiology: Recent Results (from the past 240 hours)  Resp panel by RT-PCR (RSV, Flu A&B, Covid) Anterior Nasal Swab     Status: None   Collection Time: 02/21/24  1:27 AM   Specimen: Anterior Nasal Swab  Result Value Ref Range Status   SARS Coronavirus 2 by RT PCR NEGATIVE NEGATIVE Final   Influenza A by PCR NEGATIVE NEGATIVE Final   Influenza B by PCR NEGATIVE NEGATIVE Final    Comment: (NOTE) The Xpert Xpress SARS-CoV-2/FLU/RSV plus assay is  intended as an aid in the diagnosis of influenza from Nasopharyngeal swab specimens and should not be used as a sole basis for treatment. Nasal washings and aspirates are unacceptable for Xpert Xpress SARS-CoV-2/FLU/RSV testing.  Fact Sheet for Patients: BloggerCourse.com  Fact Sheet for Healthcare Providers: SeriousBroker.it  This test is not yet approved or cleared by the [place]  FDA and has been authorized for detection and/or diagnosis of SARS-CoV-2 by FDA under an Emergency Use Authorization (EUA). This EUA will remain in effect (meaning this test can be used) for the duration of the COVID-19 declaration under Section 564(b)(1) of the Act, 21 U.S.C. section 360bbb-3(b)(1), unless the authorization is terminated or revoked.     Resp Syncytial Virus by PCR NEGATIVE NEGATIVE Final    Comment: (NOTE) Fact Sheet for Patients: BloggerCourse.com  Fact Sheet for Healthcare Providers: SeriousBroker.it  This test is not yet approved or cleared by the [place]  FDA and has been authorized for detection and/or diagnosis of SARS-CoV-2 by FDA under an Emergency Use Authorization (EUA). This EUA will remain in effect (meaning this test can be used) for the duration of the COVID-19 declaration under Section 564(b)(1) of the Act, 21 U.S.C. section 360bbb-3(b)(1), unless the authorization is terminated or revoked.  Performed at [facility], [address], [place], [place] [address]     Radiology Studies: No results found.    Odette Watanabe T. Lyndee Herbst [facility]  If 7PM-7AM, please contact night-coverage [email or web address] 02/22/2024, 11:44 AM

## 2024-02-22 NOTE — Progress Notes (Signed)
 Clonidine  ordered per MD

## 2024-02-22 NOTE — Progress Notes (Signed)
   02/22/24 1615  Assess: MEWS Score  Temp (!) 97.4 F (36.3 C)  BP (!) 168/97  MAP (mmHg) 117  Pulse Rate (!) 114  Resp 20  Level of Consciousness Alert  SpO2 99 %  O2 Device Room Air  Assess: MEWS Score  MEWS Temp 0  MEWS Systolic 0  MEWS Pulse 2  MEWS RR 0  MEWS LOC 0  MEWS Score 2  MEWS Score Color Yellow  Assess: if the MEWS score is Yellow or Red  Were vital signs accurate and taken at a resting state? Yes  Does the patient meet 2 or more of the SIRS criteria? No  MEWS guidelines implemented  Yes, yellow  Treat  MEWS Interventions Considered administering scheduled or prn medications/treatments as ordered  Take Vital Signs  Increase Vital Sign Frequency  Yellow: Q2hr x1, continue Q4hrs until patient remains green for 12hrs  Escalate  MEWS: Escalate Yellow: Discuss with charge nurse and consider notifying provider and/or RRT  Notify: Charge Nurse/RN  Name of Charge Nurse/RN Notified Ben,RN  Provider Notification  Provider Name/Title Gonfa,MD  Date Provider Notified 02/22/24  Time Provider Notified 1630  Method of Notification Page  Notification Reason Other (Comment) (Yellow MEWS)  Provider response Other (Comment) (waiting orders)  Date of Provider Response 02/22/24  Time of Provider Response 1633  Assess: SIRS CRITERIA  SIRS Temperature  0  SIRS Respirations  0  SIRS Pulse 1  SIRS WBC 0  SIRS Score Sum  1

## 2024-02-22 NOTE — Evaluation (Signed)
 Physical Therapy Evaluation Patient Details Name: [name] MRN: [id number] DOB: Sep 09, 1965 Today's Date: 02/22/2024  History of Present Illness  58 yo male adm 02/21/24 with SOB, COPD exacerbation and ETOH withdrawal. PMHx: COPD, ETOH and tobacco use d/o, anxiety, depression, polyneuropathy  Clinical Impression  TRACIE LINDBLOOM is 58 y.o. male admitted with above HPI and diagnosis. Patient is currently limited by functional impairments below (see PT problem list). Patient reports he live with family and is independent at baseline but is unreliable historian. Currently limited by impaired cognition with poor safety awareness, impaired balance, and whole body tremor secondary to ETOH withdrawal. Pt able to complete bed mob with min assist and sit<>stand with Mod Assist. Patient will benefit from continued skilled PT interventions to address impairments and progress independence with mobility. Patient will benefit from continued inpatient follow up therapy, <3 hours/day. Acute PT will follow and progress as able.         If plan is discharge home, recommend the following: Two people to help with walking and/or transfers;A lot of help with bathing/dressing/bathroom;Assistance with cooking/housework;Direct supervision/assist for medications management;Assist for transportation;Help with stairs or ramp for entrance   Can travel by private vehicle   No    Equipment Recommendations    Recommendations for Other Services       Functional Status Assessment Patient has had a recent decline in their functional status and demonstrates the ability to make significant improvements in function in a reasonable and predictable amount of time.     Precautions / Restrictions Precautions Precautions: Fall Recall of Precautions/Restrictions: Impaired Precaution/Restrictions Comments: CIWA Restrictions Weight Bearing Restrictions Per Provider Order: No      Mobility  Bed Mobility Overal bed  mobility: Needs Assistance Bed Mobility: Supine to Sit, Sit to Supine     Supine to sit: Contact guard, HOB elevated, Used rails Sit to supine: Min assist, HOB elevated, Used rails   General bed mobility comments: CGA for safety. pt with bil wrist restraints and pivoting around to dangle LE's over bed rail and sit. Assist needed for safety. Pt required min assist to bring LE's onto bed and lower trunk safely.    Transfers Overall transfer level: Needs assistance Equipment used: Rolling walker (2 wheels) Transfers: Sit to/from Stand Sit to Stand: Mod assist           General transfer comment: pt with wide BOS,    Ambulation/Gait                  Stairs            Wheelchair Mobility     Tilt Bed    Modified Rankin (Stroke Patients Only)       Balance Overall balance assessment: Needs assistance Sitting-balance support: Feet supported Sitting balance-Leahy Scale: Good     Standing balance support: Bilateral upper extremity supported, During functional activity, Reliant on assistive device for balance Standing balance-Leahy Scale: Poor                               Pertinent Vitals/Pain Pain Assessment Pain Assessment: No/denies pain    Home Living Family/patient expects to be discharged to:: Private residence Living Arrangements: Other relatives                 Additional Comments: pt not elaborating on home, not fully oriented and unreliable historian.    Prior Function Prior Level of Function : Independent/Modified Independent  Mobility Comments: pt reports ind at baseline ADLs Comments: pt reports ind at baseline     Extremity/Trunk Assessment   Upper Extremity Assessment Upper Extremity Assessment: Generalized weakness;Defer to OT evaluation;Difficult to assess due to impaired cognition    Lower Extremity Assessment Lower Extremity Assessment: Generalized weakness;Difficult to assess due to  impaired cognition    Cervical / Trunk Assessment Cervical / Trunk Assessment: Kyphotic  Communication   Communication Communication: No apparent difficulties    Cognition Arousal: Alert Behavior During Therapy: Restless, Impulsive   PT - Cognitive impairments: No family/caregiver present to determine baseline, Awareness, Orientation, Safety/Judgement, Problem solving   Orientation impairments: Place, Time, Situation                     Following commands: Impaired Following commands impaired: Follows one step commands inconsistently     Cueing Cueing Techniques: Verbal cues, Gestural cues, Tactile cues     General Comments      Exercises     Assessment/Plan    PT Assessment Patient needs continued PT services  PT Problem List Decreased strength;Decreased range of motion;Decreased activity tolerance;Decreased balance;Decreased mobility;Decreased coordination;Decreased knowledge of use of DME;Decreased safety awareness;Decreased knowledge of precautions;Cardiopulmonary status limiting activity;Decreased cognition       PT Treatment Interventions DME instruction;Patient/family education;Cognitive remediation;Neuromuscular re-education;Balance training;Therapeutic exercise;Therapeutic activities;Functional mobility training;Stair training;Gait training    PT Goals (Current goals can be found in the Care Plan section)  Acute Rehab PT Goals Patient Stated Goal: get out of bd PT Goal Formulation: With patient Time For Goal Achievement: 03/07/24 Potential to Achieve Goals: Good    Frequency Min 2X/week     Co-evaluation               AM-PAC PT 6 Clicks Mobility  Outcome Measure Help needed turning from your back to your side while in a flat bed without using bedrails?: A Little Help needed moving from lying on your back to sitting on the side of a flat bed without using bedrails?: A Little Help needed moving to and from a bed to a chair (including a  wheelchair)?: A Little Help needed standing up from a chair using your arms (e.g., wheelchair or bedside chair)?: A Little Help needed to walk in hospital room?: A Little Help needed climbing 3-5 steps with a railing? : A Little 6 Click Score: 18    End of Session Equipment Utilized During Treatment: Gait belt Activity Tolerance: Patient tolerated treatment well Patient left: in bed   PT Visit Diagnosis: Unsteadiness on feet (R26.81);Other abnormalities of gait and mobility (R26.89);Muscle weakness (generalized) (M62.81);Difficulty in walking, not elsewhere classified (R26.2);Other symptoms and signs involving the nervous system (R29.898)    Time: 8585-8563 PT Time Calculation (min) (ACUTE ONLY): 22 min   Charges:   PT Evaluation $PT Eval Moderate Complexity: 1 Mod   PT General Charges $$ ACUTE PT VISIT: 1 Visit         Vernell DONEEN KLEIN, DPT Acute Rehabilitation Services Office [phone]  02/22/24 4:08 PM

## 2024-02-22 NOTE — Plan of Care (Signed)

## 2024-02-23 DIAGNOSIS — D509 Iron deficiency anemia, unspecified: Secondary | ICD-10-CM

## 2024-02-23 DIAGNOSIS — F17210 Nicotine dependence, cigarettes, uncomplicated: Secondary | ICD-10-CM

## 2024-02-23 DIAGNOSIS — J441 Chronic obstructive pulmonary disease with (acute) exacerbation: Secondary | ICD-10-CM | POA: Diagnosis not present

## 2024-02-23 DIAGNOSIS — F10931 Alcohol use, unspecified with withdrawal delirium: Secondary | ICD-10-CM | POA: Diagnosis not present

## 2024-02-23 DIAGNOSIS — E878 Other disorders of electrolyte and fluid balance, not elsewhere classified: Secondary | ICD-10-CM

## 2024-02-23 LAB — MRSA NEXT GEN BY PCR, NASAL: MRSA by PCR Next Gen: NOT DETECTED

## 2024-02-23 LAB — CBC
HCT: 22.1 % — ABNORMAL LOW (ref 39.0–52.0)
Hemoglobin: 7.1 g/dL — ABNORMAL LOW (ref 13.0–17.0)
MCH: 27.6 pg (ref 26.0–34.0)
MCHC: 32.1 g/dL (ref 30.0–36.0)
MCV: 86 fL (ref 80.0–100.0)
Platelets: 198 K/uL (ref 150–400)
RBC: 2.57 MIL/uL — ABNORMAL LOW (ref 4.22–5.81)
RDW: 18.9 % — ABNORMAL HIGH (ref 11.5–15.5)
WBC: 8.3 K/uL (ref 4.0–10.5)
nRBC: 0 % (ref 0.0–0.2)

## 2024-02-23 LAB — COMPREHENSIVE METABOLIC PANEL WITH GFR
ALT: 25 U/L (ref 0–44)
AST: 46 U/L — ABNORMAL HIGH (ref 15–41)
Albumin: 3.4 g/dL — ABNORMAL LOW (ref 3.5–5.0)
Alkaline Phosphatase: 48 U/L (ref 38–126)
Anion gap: 13 (ref 5–15)
BUN: 10 mg/dL (ref 6–20)
CO2: 20 mmol/L — ABNORMAL LOW (ref 22–32)
Calcium: 8.9 mg/dL (ref 8.9–10.3)
Chloride: 110 mmol/L (ref 98–111)
Creatinine, Ser: 1.03 mg/dL (ref 0.61–1.24)
GFR, Estimated: >60 mL/min (ref >60)
Glucose, Bld: 83 mg/dL (ref 70–99)
Potassium: 3.5 mmol/L (ref 3.5–5.1)
Sodium: 143 mmol/L (ref 135–145)
Total Bilirubin: 0.7 mg/dL (ref 0.0–1.2)
Total Protein: 6.5 g/dL (ref 6.5–8.1)

## 2024-02-23 LAB — LACTIC ACID, PLASMA
Lactic Acid, Venous: 2.2 mmol/L (ref 0.5–1.9)
Lactic Acid, Venous: 1.5 mmol/L (ref 0.5–1.9)

## 2024-02-23 LAB — MAGNESIUM: Magnesium: 2 mg/dL (ref 1.7–2.4)

## 2024-02-23 LAB — PHOSPHORUS: Phosphorus: 3.6 mg/dL (ref 2.5–4.6)

## 2024-02-23 MED ORDER — CLONIDINE HCL 0.1 MG PO TABS
0.1000 mg | ORAL_TABLET | Freq: Three times a day (TID) | ORAL | Status: DC
  Administered 2024-02-23 – 2024-02-24 (×5): 0.1 mg via ORAL
  Filled 2024-02-23 (×5): qty 1

## 2024-02-23 MED ORDER — SODIUM CHLORIDE 0.9 % IV BOLUS
1000.0000 mL | Freq: Once | INTRAVENOUS | Status: AC
  Administered 2024-02-23: 1000 mL via INTRAVENOUS

## 2024-02-23 MED ORDER — PHENOBARBITAL 32.4 MG PO TABS
97.2000 mg | ORAL_TABLET | Freq: Three times a day (TID) | ORAL | Status: AC
  Administered 2024-02-23 – 2024-02-25 (×6): 97.2 mg via ORAL
  Filled 2024-02-23 (×6): qty 3

## 2024-02-23 MED ORDER — CHLORHEXIDINE GLUCONATE CLOTH 2 % EX PADS
6.0000 | MEDICATED_PAD | Freq: Every day | CUTANEOUS | Status: DC
  Administered 2024-02-23 – 2024-02-25 (×3): 6 via TOPICAL

## 2024-02-23 MED ORDER — CLONIDINE HCL 0.1 MG PO TABS: 0.2000 mg | ORAL_TABLET | Freq: Three times a day (TID) | ORAL | Status: DC

## 2024-02-23 MED ORDER — PHENOBARBITAL SODIUM 130 MG/ML IJ SOLN
130.0000 mg | Freq: Once | INTRAMUSCULAR | Status: AC
  Administered 2024-02-23: 130 mg via INTRAVENOUS
  Filled 2024-02-23: qty 1

## 2024-02-23 MED ORDER — LORAZEPAM 2 MG/ML IJ SOLN
1.0000 mg | INTRAMUSCULAR | Status: DC | PRN
  Administered 2024-02-23 (×3): 1 mg via INTRAVENOUS
  Filled 2024-02-23 (×4): qty 1

## 2024-02-23 MED ORDER — PHENOBARBITAL 32.4 MG PO TABS
64.8000 mg | ORAL_TABLET | Freq: Three times a day (TID) | ORAL | Status: AC
  Administered 2024-02-25 – 2024-02-27 (×6): 64.8 mg via ORAL
  Filled 2024-02-23 (×6): qty 2

## 2024-02-23 MED ORDER — PHENOBARBITAL 32.4 MG PO TABS: 32.4000 mg | ORAL_TABLET | Freq: Three times a day (TID) | ORAL | Status: DC

## 2024-02-23 NOTE — Progress Notes (Signed)
 At 333 Patient observed in bed having severe tremors to left arm and bilat lower extremities. And beads of sweat noted to forehead, scored at 20 CIWA medicated with Ativan  as per order, will reassess as per order in 1 hour.

## 2024-02-23 NOTE — Plan of Care (Signed)

## 2024-02-23 NOTE — Progress Notes (Signed)
 OT Cancellation Note  Patient Details Name: [name] MRN: [id number] DOB: June 18, 1966   Cancelled Treatment:    Reason Eval/Treat Not Completed: Patient not medically ready (Per RN, pt with increased agitation and requiring restraints at this time. RN requests OT reattempt to see pt for OT eval tomorrow. OT to reattempt as appropriate/available.)  Margarie Rockey HERO., OTR/L, MA Acute Rehab [phone]   Margarie FORBES Horns 02/23/2024, 2:49 PM

## 2024-02-23 NOTE — Progress Notes (Signed)
 PROGRESS NOTE  [name] FMW:[id number] DOB: [birth year]   PCP: Pcp, No  Patient is from: Home.  DOA: 02/21/2024 LOS: 2  Chief complaints Chief Complaint  Patient presents with   Shortness of Breath     Brief Narrative / Interim history: 58 year old M with PMH of asthma/COPD, alcohol  and tobacco use disorder, anxiety, depression, alcohol  induced polyneuropathy brought to ED by EMS with shortness of breath and found to be hypoxic to 86% on RA when fire fighter's found him.  He was admitted with acute respiratory failure with hypoxia due to COPD exacerbation and alcohol  withdrawal.  In the ED, pt tachycardic. Na 130, K 3.2, Mg 1.4, Phos 5.3, lactic acid 4.3, serum osm 339, COVID-19, influenza and RSV PCR nonreactive.    Electrolyte improved but patient went into DT with CIWA as high at 33 despite Ativan , Librium  and clonidine .  Transferred to ICU on 7/5.  Subjective: Seen and examined earlier this morning.  Patient NGT with CIWA as high as 33 this morning.  He received up to 20 mg of IV Ativan  in addition to Librium  and clonidine .   Objective: Vitals:   02/23/24 0550 02/23/24 0600 02/23/24 0657 02/23/24 0743  BP: (!) 146/100 127/79 138/84 128/67  Pulse: (!) 110 (!) 105 (!) 111 (!) 131  Resp:  20    Temp:  98 F (36.7 C)    TempSrc:  Axillary    SpO2:  100%    Weight:      Height:        Examination:  GENERAL: No apparent distress.  Appears frail. HEENT: MMM.  Vision and hearing grossly intact.  NECK: Supple.  No apparent JVD.  RESP:  No IWOB.  Fair aeration bilaterally. CVS: Tachycardic to 120s.  Heart sounds normal.  ABD/GI/GU: BS+. Abd soft, NTND.  MSK/EXT:  Moves extremities.  Bilateral wrist restraints in place. SKIN: no apparent skin lesion or wound NEURO: Confused.  Disoriented.  Tremulous.  Sweaty.  Consultants:  None  Procedures: None  Microbiology summarized: COVID-19, influenza and RSV PCR nonreactive  Assessment and plan: Acute COPD  exacerbation: Called EMS due to shortness of breath and found to be hypoxic to 86 when firefighters arrived.  Reportedly ran out of his inhalers.  Does not seem to have respiratory distress at the moment.  Saturating in upper 90s on room air.  CXR without acute finding.  No fever or leukocytosis. -Continue ceftriaxone , Singulair  and as needed DuoNebs -Decrease Solu-Medrol  to 40 mg daily starting tomorrow - Continue Anoro Ellipta . -Encouraged smoking cessation when able to comprehend    [facility] withdrawal/delirium tremens: History unclear but prior history of alcohol  withdrawal requiring ICU stay.  CIWA was high at 33 this morning.  Received up to 20 mg of IV Ativan  in addition to Librium  and clonidine .  -Discussed with PCCM.  Transferred to ICU.  May need either Precedex  drip or phenobarbital  taper. -Multivitamin, thiamine  and folic acid  -Fall precaution and abdominal belt. -Reorientation and delirium precaution  Hyponatremia/hypokalemia/hypomagnesemia: Likely due to alcohol . - Follow morning labs.  Iron  deficiency anemia: Anemia panel with severe iron  deficiency Recent Labs    02/21/24 0127 02/22/24 1215  HGB 7.9* 7.9*  - IV Venofer  300 mg x 1 on 7/4.  Tobacco use disorder - Nicotine  patch - Will counsel on cessation  Hyperphosphatemia: Resolved.  Lactic acidosis: Likely due to alcohol . - Continue IV fluid - Recheck this morning  Physical deconditioning - PT/OT  Body mass index is 20.67 kg/m.  DVT prophylaxis:  SCDs Start: 02/21/24 0407  Code Status: Full code Family Communication: Attempted to call patient's sister, Orlean over the phone but no answer. Level of care: ICU Status is: Inpatient Remains inpatient appropriate because: Alcohol  withdrawal/delirium tremens   Final disposition: To be determined   CRITICAL CARE Performed by: Mignon ONEIDA Bump   Total critical care time: 45 minutes  Critical care time was exclusive of separately billable  procedures and treating other patients.  Critical care was necessary to treat or prevent imminent or life-threatening deterioration.  Critical care was time spent personally by me on the following activities: development of treatment plan with patient and/or surrogate as well as nursing, discussions with consultants, evaluation of patient's response to treatment, examination of patient, obtaining history from patient or surrogate, ordering and performing treatments and interventions, ordering and review of laboratory studies, ordering and review of radiographic studies, pulse oximetry and re-evaluation of patient's condition.   Sch Meds:  Scheduled Meds:  chlordiazePOXIDE   10 mg Oral TID   Followed by   NOREEN ON 02/24/2024] chlordiazePOXIDE   10 mg Oral BH-qamhs   Followed by   NOREEN ON 02/25/2024] chlordiazePOXIDE   10 mg Oral Daily   cloNIDine   0.1 mg Oral TID   folic acid   1 mg Oral Daily   methylPREDNISolone  (SOLU-MEDROL ) injection  40 mg Intravenous Q24H   montelukast   10 mg Oral QHS   multivitamin with minerals  1 tablet Oral Daily   nicotine   14 mg Transdermal Daily   pantoprazole   40 mg Oral Daily   sodium chloride  flush  3 mL Intravenous Q12H   thiamine   100 mg Oral Daily   Or   thiamine   100 mg Intravenous Daily   umeclidinium-vilanterol  1 puff Inhalation Daily   Continuous Infusions:  0.9 % NaCl with KCl 20 mEq / L 75 mL/hr at 02/23/24 0005   cefTRIAXone  (ROCEPHIN )  IV 1 g (02/22/24 1512)   sodium chloride      PRN Meds:.acetaminophen , ipratropium-albuterol , loperamide , LORazepam  **OR** LORazepam , melatonin, metoprolol  tartrate, ondansetron  (ZOFRAN ) IV, ondansetron , polyethylene glycol  Antimicrobials: Anti-infectives (From admission, onward)    Start     Dose/Rate Route Frequency Ordered Stop   02/21/24 1415  cefTRIAXone  (ROCEPHIN ) 1 g in sodium chloride  0.9 % 100 mL IVPB        1 g 200 mL/hr over 30 Minutes Intravenous Every 24 hours 02/21/24 1413 02/26/24 1359         I have personally reviewed the following labs and images: CBC: Recent Labs  Lab 02/21/24 0127 02/22/24 1215  WBC 4.4 7.6  HGB 7.9* 7.9*  HCT 23.3* 24.2*  MCV 83.2 85.8  PLT 209 219   BMP &GFR Recent Labs  Lab 02/21/24 0127 02/21/24 0502 02/22/24 1215  NA 125* 130* 138  K 3.4* 3.2* 3.6  CL 89* 92* 101  CO2 22 20* 22  GLUCOSE 89 99 116*  BUN <5* <5* 6  CREATININE 0.60* 0.61 0.74  CALCIUM 8.7* 8.7* 9.4  MG  --  1.4* 2.1  PHOS  --  5.3* 4.0   Estimated Creatinine Clearance: 85.3 mL/min (by C-G formula based on SCr of 0.74 mg/dL). Liver & Pancreas: Recent Labs  Lab 02/21/24 0459 02/22/24 1215  AST 73* 50*  ALT 33 29  ALKPHOS 58 61  BILITOT 0.3 0.9  PROT 7.3 7.4  ALBUMIN 3.5 3.8   No results for input(s): LIPASE, AMYLASE in the last 168 hours. No results for input(s): AMMONIA in the last 168  hours. Diabetic: No results for input(s): HGBA1C in the last 72 hours. No results for input(s): GLUCAP in the last 168 hours. Cardiac Enzymes: No results for input(s): CKTOTAL, CKMB, CKMBINDEX, TROPONINI in the last 168 hours. No results for input(s): PROBNP in the last 8760 hours. Coagulation Profile: No results for input(s): INR, PROTIME in the last 168 hours. Thyroid  Function Tests: No results for input(s): TSH, T4TOTAL, FREET4, T3FREE, THYROIDAB in the last 72 hours. Lipid Profile: No results for input(s): CHOL, HDL, LDLCALC, TRIG, CHOLHDL, LDLDIRECT in the last 72 hours. Anemia Panel: Recent Labs    02/21/24 0502  VITAMINB12 811  FOLATE 10.4  FERRITIN 23*  TIBC 477*  IRON  9*   Urine analysis:    Component Value Date/Time   COLORURINE COLORLESS (A) 02/21/2024 0422   APPEARANCEUR CLEAR 02/21/2024 0422   LABSPEC 1.002 (L) 02/21/2024 0422   PHURINE 5.0 02/21/2024 0422   GLUCOSEU NEGATIVE 02/21/2024 0422   HGBUR NEGATIVE 02/21/2024 0422   BILIRUBINUR NEGATIVE 02/21/2024 0422   KETONESUR NEGATIVE 02/21/2024  0422   PROTEINUR NEGATIVE 02/21/2024 0422   UROBILINOGEN 0.2 06/19/2013 2025   NITRITE NEGATIVE 02/21/2024 0422   LEUKOCYTESUR NEGATIVE 02/21/2024 0422   Sepsis Labs: Invalid input(s): PROCALCITONIN, LACTICIDVEN  Microbiology: Recent Results (from the past 240 hours)  Resp panel by RT-PCR (RSV, Flu A&B, Covid) Anterior Nasal Swab     Status: None   Collection Time: 02/21/24  1:27 AM   Specimen: Anterior Nasal Swab  Result Value Ref Range Status   SARS Coronavirus 2 by RT PCR NEGATIVE NEGATIVE Final   Influenza A by PCR NEGATIVE NEGATIVE Final   Influenza B by PCR NEGATIVE NEGATIVE Final    Comment: (NOTE) The Xpert Xpress SARS-CoV-2/FLU/RSV plus assay is intended as an aid in the diagnosis of influenza from Nasopharyngeal swab specimens and should not be used as a sole basis for treatment. Nasal washings and aspirates are unacceptable for Xpert Xpress SARS-CoV-2/FLU/RSV testing.  Fact Sheet for Patients: BloggerCourse.com  Fact Sheet for Healthcare Providers: SeriousBroker.it  This test is not yet approved or cleared by the [place]  FDA and has been authorized for detection and/or diagnosis of SARS-CoV-2 by FDA under an Emergency Use Authorization (EUA). This EUA will remain in effect (meaning this test can be used) for the duration of the COVID-19 declaration under Section 564(b)(1) of the Act, 21 U.S.C. section 360bbb-3(b)(1), unless the authorization is terminated or revoked.     Resp Syncytial Virus by PCR NEGATIVE NEGATIVE Final    Comment: (NOTE) Fact Sheet for Patients: BloggerCourse.com  Fact Sheet for Healthcare Providers: SeriousBroker.it  This test is not yet approved or cleared by the [place]  FDA and has been authorized for detection and/or diagnosis of SARS-CoV-2 by FDA under an Emergency Use Authorization (EUA). This EUA will remain in  effect (meaning this test can be used) for the duration of the COVID-19 declaration under Section 564(b)(1) of the Act, 21 U.S.C. section 360bbb-3(b)(1), unless the authorization is terminated or revoked.  Performed at [facility], [address], [place], [place] [address]     Radiology Studies: No results found.    Adleigh Mcmasters T. Aeric Burnham [facility]  If 7PM-7AM, please contact night-coverage [email or web address] 02/23/2024, 8:04 AM

## 2024-02-23 NOTE — H&P (Signed)
 NAME:  [name], MRN:  [id number], DOB:  1966/01/25, LOS: 2 ADMISSION DATE:  02/21/2024, CONSULTATION DATE:  02/23/2024 REFERRING MD:  Dr. Kathrin - EDP , CHIEF COMPLAINT:  ETOH withdrawal    History of Present Illness:  [name] is a 58 y.o. male with a PMH significant for EtOH abuse, COPD, HTN, depression, and anxiety who presented to the ED July 3 for complaints of shortness of breath was found to be hypoxic to 86% on room air he was admitted per hospitalist service for acute hypoxic respiratory failure due to COPD exacerbation with underlying alcohol  withdrawal.  Overnight of 7/5 patient required high dose of as needed Ativan  for elevated CIWA protocols upwards of 20 mg with persistent ongoing withdrawal therefore PCCM consulted for further management and transfer to ICU for phenobarbital  initiation.  Pertinent  Medical History   EtOH abuse, COPD, HTN, depression, and anxiety  Significant Hospital Events: Including procedures, antibiotic start and stop dates in addition to other pertinent events   77/3 presented with acute onset shortness of breath admitted for COPD exacerbation with underlying alcohol  withdrawal 7/5 progressive worsening withdrawal prompted transfer to ICU and initiation of phenobarbital   Interim History / Subjective:  As above  Objective    Blood pressure 128/67, pulse (!) 131, temperature 98 F (36.7 C), temperature source Axillary, resp. rate 20, height 5' 7 (1.702 m), weight 59.9 kg, SpO2 100%.        Intake/Output Summary (Last 24 hours) at 02/23/2024 0806 Last data filed at 02/22/2024 1549 Gross per 24 hour  Intake 778.6 ml  Output --  Net 778.6 ml   Filed Weights   02/21/24 0015  Weight: 59.9 kg    Examination: General: Acute om chronically ill appearing middle aged male lying in bed, in NAD HEENT: [place]/AT, MM pink/moist, PERRL,  Neuro: Alert and oriented x2, confused to situation  CV: s1s2 regular rate and rhythm, no murmur, rubs, or  gallops,  PULM:  Clear to ascultation, no increased work of breathing, no wheezing, on RA GI: soft, bowel sounds active in all 4 quadrants, non-tender, non-distended Extremities: warm/dry, no edema  Skin: no rashes or lesions  Resolved problem list   Assessment and Plan  Acute alcohol  withdrawal - Unclear history and amount of alcohol  consumption at baseline.  Received upwards of 20 mg of Ativan  overnight prompting transfer to ICU a.m. 7/5 P: Transfer to ICU for close monitoring Start phenobarbital  taper As needed benzodiazepine Seizure precautions Aspiration precautions  History of COPD with acute exacerbation on admission -Initially presented to the ED with complaints of shortness of breath, he was found hypoxic on EMS arrival.  Reports running out of rescue inhaler Tobacco use disorder P: Continue empiric ceftriaxone  Hold Ellipta inhaler in favor of nebulizers Solu-Medrol  40 mg daily when able to safely take orals transition to p.o. Smoking cessation education Encourage pulmonary hygiene Mobilize as able  Iron  deficiency anemia P: Transfuse per protocol Hemoglobin goal greater than 7 Platelet goal greater than 10  Lactic acidosis P: Continue empiric ceftriaxone  Follow for clearance   Best Practice (right click and Reselect all SmartList Selections daily)   Diet/type: NPO DVT prophylaxis SCD Pressure ulcer(s): N/A GI prophylaxis: PPI Lines: N/A Foley:  N/A Code Status:  full code Last date of multidisciplinary goals of care discussion: pending   Labs   CBC: Recent Labs  Lab 02/21/24 0127 02/22/24 1215  WBC 4.4 7.6  HGB 7.9* 7.9*  HCT 23.3* 24.2*  MCV 83.2 85.8  PLT 209 219    Basic Metabolic Panel: Recent Labs  Lab 02/21/24 0127 02/21/24 0502 02/22/24 1215  NA 125* 130* 138  K 3.4* 3.2* 3.6  CL 89* 92* 101  CO2 22 20* 22  GLUCOSE 89 99 116*  BUN <5* <5* 6  CREATININE 0.60* 0.61 0.74  CALCIUM 8.7* 8.7* 9.4  MG  --  1.4* 2.1  PHOS  --   5.3* 4.0   GFR: Estimated Creatinine Clearance: 85.3 mL/min (by C-G formula based on SCr of 0.74 mg/dL). Recent Labs  Lab 02/21/24 0127 02/21/24 0502 02/21/24 0700 02/22/24 1215 02/22/24 1541  WBC 4.4  --   --  7.6  --   LATICACIDVEN  --  4.3* 4.3* 3.0* 4.4*    Liver Function Tests: Recent Labs  Lab 02/21/24 0459 02/22/24 1215  AST 73* 50*  ALT 33 29  ALKPHOS 58 61  BILITOT 0.3 0.9  PROT 7.3 7.4  ALBUMIN 3.5 3.8   No results for input(s): LIPASE, AMYLASE in the last 168 hours. No results for input(s): AMMONIA in the last 168 hours.  ABG    Component Value Date/Time   TCO2 24 04/22/2012 0922     Coagulation Profile: No results for input(s): INR, PROTIME in the last 168 hours.  Cardiac Enzymes: No results for input(s): CKTOTAL, CKMB, CKMBINDEX, TROPONINI in the last 168 hours.  HbA1C: No results found for: HGBA1C  CBG: No results for input(s): GLUCAP in the last 168 hours.  Review of Systems:   Unable to assess   Past Medical History:  He,  has a past medical history of Alcohol  abuse, Allergy, Anxiety, Arthritis, Asthma, COPD (chronic obstructive pulmonary disease) (HCC), Depression, Hypertension, and Shortness of breath.   Surgical History:   Past Surgical History:  Procedure Laterality Date   BACK SURGERY     BIOPSY  06/02/2019   Procedure: BIOPSY;  Surgeon: Elicia Claw, MD;  Location: [facility];  Service: Gastroenterology;;   COLONOSCOPY WITH PROPOFOL  N/A 06/02/2019   Procedure: COLONOSCOPY WITH PROPOFOL ;  Surgeon: Elicia Claw, MD;  Location: [facility];  Service: Gastroenterology;  Laterality: N/A;   ESOPHAGOGASTRODUODENOSCOPY (EGD) WITH PROPOFOL  N/A 06/02/2019   Procedure: ESOPHAGOGASTRODUODENOSCOPY (EGD) WITH PROPOFOL ;  Surgeon: Elicia Claw, MD;  Location: [facility];  Service: Gastroenterology;  Laterality: N/A;   HEMORRHOID SURGERY     HERNIA REPAIR     MANDIBLE FRACTURE SURGERY     POLYPECTOMY   06/02/2019   Procedure: POLYPECTOMY;  Surgeon: Elicia Claw, MD;  Location: [facility];  Service: Gastroenterology;;     Social History:   reports that he has been smoking cigarettes. He has a 17.5 pack-year smoking history. He has never used smokeless tobacco. He reports current alcohol  use of about 6.0 standard drinks of alcohol  per week. He reports that he does not use drugs.   Family History:  His family history includes Hypertension in his brother, father, and mother.   Allergies No Known Allergies   Home Medications  Prior to Admission medications   Medication Sig Start Date End Date Taking? Authorizing Provider  albuterol  (VENTOLIN  HFA) 108 (90 Base) MCG/ACT inhaler Inhale 2 puffs into the lungs every 4 (four) hours as needed for wheezing or shortness of breath. Patient not taking: Reported on 02/22/2024 08/27/22   Lenor Hollering, MD     Critical care time:   CRITICAL CARE Performed by: Trevon Strothers D. Harris   Total critical care time: 40 minutes  Critical care time was exclusive of separately billable procedures and  treating other patients.  Critical care was necessary to treat or prevent imminent or life-threatening deterioration.  Critical care was time spent personally by me on the following activities: development of treatment plan with patient and/or surrogate as well as nursing, discussions with consultants, evaluation of patient's response to treatment, examination of patient, obtaining history from patient or surrogate, ordering and performing treatments and interventions, ordering and review of laboratory studies, ordering and review of radiographic studies, pulse oximetry and re-evaluation of patient's condition.  Rito Lecomte D. Harris, NP-C [facility] Personal contact information can be found on Amion  If no contact or response made please call [phone] 02/23/2024, 8:48 AM

## 2024-02-24 DIAGNOSIS — J441 Chronic obstructive pulmonary disease with (acute) exacerbation: Secondary | ICD-10-CM | POA: Diagnosis not present

## 2024-02-24 DIAGNOSIS — F10931 Alcohol use, unspecified with withdrawal delirium: Secondary | ICD-10-CM | POA: Diagnosis not present

## 2024-02-24 DIAGNOSIS — D509 Iron deficiency anemia, unspecified: Secondary | ICD-10-CM | POA: Diagnosis not present

## 2024-02-24 LAB — CBC
HCT: 21.8 % — ABNORMAL LOW (ref 39.0–52.0)
Hemoglobin: 7 g/dL — ABNORMAL LOW (ref 13.0–17.0)
MCH: 28.5 pg (ref 26.0–34.0)
MCHC: 32.1 g/dL (ref 30.0–36.0)
MCV: 88.6 fL (ref 80.0–100.0)
Platelets: 165 K/uL (ref 150–400)
RBC: 2.46 MIL/uL — ABNORMAL LOW (ref 4.22–5.81)
RDW: 19.4 % — ABNORMAL HIGH (ref 11.5–15.5)
WBC: 5.9 K/uL (ref 4.0–10.5)
nRBC: 0.7 % — ABNORMAL HIGH (ref 0.0–0.2)

## 2024-02-24 LAB — BASIC METABOLIC PANEL WITH GFR
Anion gap: 9 (ref 5–15)
BUN: 9 mg/dL (ref 6–20)
CO2: 22 mmol/L (ref 22–32)
Calcium: 8.4 mg/dL — ABNORMAL LOW (ref 8.9–10.3)
Chloride: 108 mmol/L (ref 98–111)
Creatinine, Ser: 0.55 mg/dL — ABNORMAL LOW (ref 0.61–1.24)
GFR, Estimated: >60 mL/min (ref >60)
Glucose, Bld: 78 mg/dL (ref 70–99)
Potassium: 3 mmol/L — ABNORMAL LOW (ref 3.5–5.1)
Sodium: 139 mmol/L (ref 135–145)

## 2024-02-24 LAB — PHOSPHORUS: Phosphorus: 4.5 mg/dL (ref 2.5–4.6)

## 2024-02-24 LAB — MAGNESIUM: Magnesium: 1.9 mg/dL (ref 1.7–2.4)

## 2024-02-24 MED ORDER — ARFORMOTEROL TARTRATE 15 MCG/2ML IN NEBU
15.0000 ug | INHALATION_SOLUTION | Freq: Two times a day (BID) | RESPIRATORY_TRACT | Status: DC
  Administered 2024-02-24 – 2024-02-27 (×7): 15 ug via RESPIRATORY_TRACT
  Filled 2024-02-24 (×8): qty 2

## 2024-02-24 MED ORDER — METHYLPREDNISOLONE 16 MG PO TABS
40.0000 mg | ORAL_TABLET | Freq: Once | ORAL | Status: AC
  Administered 2024-02-25: 40 mg via ORAL
  Filled 2024-02-24 (×2): qty 3

## 2024-02-24 MED ORDER — BUDESONIDE 0.5 MG/2ML IN SUSP
0.5000 mg | Freq: Two times a day (BID) | RESPIRATORY_TRACT | Status: DC
  Administered 2024-02-24 – 2024-02-27 (×7): 0.5 mg via RESPIRATORY_TRACT
  Filled 2024-02-24 (×7): qty 2

## 2024-02-24 MED ORDER — METHYLPREDNISOLONE 4 MG PO TABS
40.0000 mg | ORAL_TABLET | Freq: Every day | ORAL | Status: DC
  Filled 2024-02-24: qty 2

## 2024-02-24 MED ORDER — MAGNESIUM SULFATE 2 GM/50ML IV SOLN
2.0000 g | Freq: Once | INTRAVENOUS | Status: AC
  Administered 2024-02-24: 2 g via INTRAVENOUS
  Filled 2024-02-24: qty 50

## 2024-02-24 MED ORDER — POTASSIUM CHLORIDE 10 MEQ/100ML IV SOLN
10.0000 meq | INTRAVENOUS | Status: AC
  Administered 2024-02-24 (×4): 10 meq via INTRAVENOUS
  Filled 2024-02-24 (×4): qty 100

## 2024-02-24 MED ORDER — POTASSIUM CHLORIDE CRYS ER 20 MEQ PO TBCR
20.0000 meq | EXTENDED_RELEASE_TABLET | ORAL | Status: AC
  Administered 2024-02-24 (×2): 20 meq via ORAL
  Filled 2024-02-24 (×2): qty 1

## 2024-02-24 NOTE — Progress Notes (Signed)
 Patient arrived from ICU to 5W at around 1650.Vitals stable,Alert and oriented*3,calm and cooperative.Didn't have any restraints on.

## 2024-02-24 NOTE — Progress Notes (Signed)
 NAME:  [name], MRN:  [id number], DOB:  03/29/1966, LOS: 3 ADMISSION DATE:  02/21/2024, CONSULTATION DATE:  02/23/2024 REFERRING MD:  Dr. Kathrin - EDP , CHIEF COMPLAINT:  ETOH withdrawal    History of Present Illness:  [name] is a 58 y.o. male with a PMH significant for EtOH abuse, COPD, HTN, depression, and anxiety who presented to the ED July 3 for complaints of shortness of breath was found to be hypoxic to 86% on room air he was admitted per hospitalist service for acute hypoxic respiratory failure due to COPD exacerbation with underlying alcohol  withdrawal.  Overnight of 7/5 patient required high dose of as needed Ativan  for elevated CIWA protocols upwards of 20 mg with persistent ongoing withdrawal therefore PCCM consulted for further management and transfer to ICU for phenobarbital  initiation.  Pertinent  Medical History   EtOH abuse, COPD, HTN, depression, and anxiety  Significant Hospital Events: Including procedures, antibiotic start and stop dates in addition to other pertinent events   77/3 presented with acute onset shortness of breath admitted for COPD exacerbation with underlying alcohol  withdrawal 7/5 progressive worsening withdrawal prompted transfer to ICU and initiation of phenobarbital   Interim History / Subjective:   No acute events overnight He is alert to person and place this morning He has been getting ativan  as needed  Objective    Blood pressure 131/77, pulse 61, temperature 98.1 F (36.7 C), temperature source Oral, resp. rate 14, height 5' 7 (1.702 m), weight 59.9 kg, SpO2 98%.        Intake/Output Summary (Last 24 hours) at 02/24/2024 0747 Last data filed at 02/24/2024 9383 Gross per 24 hour  Intake 1626.72 ml  Output 1000 ml  Net 626.72 ml   Filed Weights   02/21/24 0015  Weight: 59.9 kg    Examination: General: Acute om chronically ill appearing middle aged male lying in bed, in NAD HEENT: [place]/AT, MM pink/moist, PERRL,  Neuro:  Alert and oriented x2, confused to time CV: s1s2 regular rate and rhythm, no murmur, rubs, or gallops,  PULM:  Clear to ascultation, no increased work of breathing, no wheezing, on RA GI: soft, bowel sounds active in all 4 quadrants, non-tender, non-distended Extremities: warm/dry, no edema  Skin: no rashes or lesions  Resolved problem list   Assessment and Plan  Acute alcohol  withdrawal - Drinks 3, 40oz ice beers per day.  Received upwards of 20 mg of Ativan  overnight prompting transfer to ICU a.m. 7/5 P: Continue phenobarbital  taper As needed benzodiazepine Seizure precautions Aspiration precautions  History of COPD with acute exacerbation on admission -Initially presented to the ED with complaints of shortness of breath, he was found hypoxic on EMS arrival.  Reports running out of rescue inhaler Tobacco use disorder P: Continue empiric ceftriaxone  Hold Ellipta inhaler in favor of nebulizers Budesonide /brovana  nebs twice daily Solu-Medrol  40 mg daily when able to safely take orals transition to p.o. Smoking cessation education Encourage pulmonary hygiene Mobilize as able  Iron  deficiency anemia P: Transfuse per protocol Hemoglobin goal greater than 7 Platelet goal greater than 10   Best Practice (right click and Reselect all SmartList Selections daily)   Diet/type: Regular consistency (see orders) DVT prophylaxis SCD Pressure ulcer(s): N/A GI prophylaxis: PPI Lines: N/A Foley:  N/A Code Status:  full code Last date of multidisciplinary goals of care discussion: pending   Labs   CBC: Recent Labs  Lab 02/21/24 0127 02/22/24 1215 02/23/24 0835 02/24/24 0342  WBC 4.4 7.6 8.3 5.9  HGB 7.9* 7.9* 7.1* 7.0*  HCT 23.3* 24.2* 22.1* 21.8*  MCV 83.2 85.8 86.0 88.6  PLT 209 219 198 165    Basic Metabolic Panel: Recent Labs  Lab 02/21/24 0127 02/21/24 0502 02/22/24 1215 02/23/24 0835 02/24/24 0342  NA 125* 130* 138 143 139  K 3.4* 3.2* 3.6 3.5 3.0*  CL  89* 92* 101 110 108  CO2 22 20* 22 20* 22  GLUCOSE 89 99 116* 83 78  BUN <5* <5* 6 10 9   CREATININE 0.60* 0.61 0.74 1.03 0.55*  CALCIUM 8.7* 8.7* 9.4 8.9 8.4*  MG  --  1.4* 2.1 2.0 1.9  PHOS  --  5.3* 4.0 3.6 4.5   GFR: Estimated Creatinine Clearance: 85.3 mL/min (A) (by C-G formula based on SCr of 0.55 mg/dL (L)). Recent Labs  Lab 02/21/24 0127 02/21/24 0502 02/22/24 1215 02/22/24 1541 02/23/24 0835 02/23/24 1038 02/24/24 0342  WBC 4.4  --  7.6  --  8.3  --  5.9  LATICACIDVEN  --    < > 3.0* 4.4* 2.2* 1.5  --    < > = values in this interval not displayed.    Liver Function Tests: Recent Labs  Lab 02/21/24 0459 02/22/24 1215 02/23/24 0835  AST 73* 50* 46*  ALT 33 29 25  ALKPHOS 58 61 48  BILITOT 0.3 0.9 0.7  PROT 7.3 7.4 6.5  ALBUMIN 3.5 3.8 3.4*   No results for input(s): LIPASE, AMYLASE in the last 168 hours. No results for input(s): AMMONIA in the last 168 hours.  ABG    Component Value Date/Time   TCO2 24 04/22/2012 0922     Coagulation Profile: No results for input(s): INR, PROTIME in the last 168 hours.  Cardiac Enzymes: No results for input(s): CKTOTAL, CKMB, CKMBINDEX, TROPONINI in the last 168 hours.  HbA1C: No results found for: HGBA1C  CBG: No results for input(s): GLUCAP in the last 168 hours.     Critical care time: n/a   Dorn Chill, MD [facility] Office: [phone]   See Amion for personal pager PCCM on call pager [phone] until 7pm. Please call Elink 7p-7a. [phone]

## 2024-02-24 NOTE — Progress Notes (Signed)
 [facility] ADULT ICU REPLACEMENT PROTOCOL   The patient does apply for the [facility] Adult ICU Electrolyte Replacment Protocol based on the criteria listed below:   1.Exclusion criteria: TCTS, ECMO, Dialysis, and Myasthenia Gravis patients 2. Is GFR >/= 30 ml/min? Yes.    Patient's GFR today is >60 3. Is SCr </= 2? Yes.   Patient's SCr is 0.55 mg/dL 4. Did SCr increase >/= 0.5 in 24 hours? No. 5.Pt's weight >40kg  Yes.   6. Abnormal electrolyte(s): K, Mag  7. Electrolytes replaced per protocol 8.  Call MD STAT for K+ </= 2.5, Phos </= 1, or Mag </= 1 Physician:  Joelyn Hunter BRAVO Emonni Depasquale 02/24/2024 4:14 AM

## 2024-02-25 ENCOUNTER — Inpatient Hospital Stay (HOSPITAL_COMMUNITY)

## 2024-02-25 DIAGNOSIS — F10931 Alcohol use, unspecified with withdrawal delirium: Secondary | ICD-10-CM | POA: Diagnosis not present

## 2024-02-25 LAB — COMPREHENSIVE METABOLIC PANEL WITH GFR
ALT: 26 U/L (ref 0–44)
AST: 36 U/L (ref 15–41)
Albumin: 3.1 g/dL — ABNORMAL LOW (ref 3.5–5.0)
Alkaline Phosphatase: 43 U/L (ref 38–126)
Anion gap: 11 (ref 5–15)
BUN: 12 mg/dL (ref 6–20)
CO2: 24 mmol/L (ref 22–32)
Calcium: 8.8 mg/dL — ABNORMAL LOW (ref 8.9–10.3)
Chloride: 99 mmol/L (ref 98–111)
Creatinine, Ser: 0.64 mg/dL (ref 0.61–1.24)
GFR, Estimated: >60 mL/min (ref >60)
Glucose, Bld: 93 mg/dL (ref 70–99)
Potassium: 4 mmol/L (ref 3.5–5.1)
Sodium: 134 mmol/L — ABNORMAL LOW (ref 135–145)
Total Bilirubin: 0.5 mg/dL (ref 0.0–1.2)
Total Protein: 5.9 g/dL — ABNORMAL LOW (ref 6.5–8.1)

## 2024-02-25 LAB — C-REACTIVE PROTEIN: CRP: <0.5 mg/dL (ref <1.0)

## 2024-02-25 LAB — CBC WITH DIFFERENTIAL/PLATELET
Abs Immature Granulocytes: 0.03 K/uL (ref 0.00–0.07)
Basophils Absolute: 0 K/uL (ref 0.0–0.1)
Basophils Relative: 0 %
Eosinophils Absolute: 0.1 K/uL (ref 0.0–0.5)
Eosinophils Relative: 1 %
HCT: 23.8 % — ABNORMAL LOW (ref 39.0–52.0)
Hemoglobin: 7.6 g/dL — ABNORMAL LOW (ref 13.0–17.0)
Immature Granulocytes: 1 %
Lymphocytes Relative: 16 %
Lymphs Abs: 1.1 K/uL (ref 0.7–4.0)
MCH: 28.4 pg (ref 26.0–34.0)
MCHC: 31.9 g/dL (ref 30.0–36.0)
MCV: 88.8 fL (ref 80.0–100.0)
Monocytes Absolute: 0.7 K/uL (ref 0.1–1.0)
Monocytes Relative: 12 %
Neutro Abs: 4.5 K/uL (ref 1.7–7.7)
Neutrophils Relative %: 70 %
Platelets: 184 K/uL (ref 150–400)
RBC: 2.68 MIL/uL — ABNORMAL LOW (ref 4.22–5.81)
RDW: 19.2 % — ABNORMAL HIGH (ref 11.5–15.5)
WBC: 6.4 K/uL (ref 4.0–10.5)
nRBC: 0.9 % — ABNORMAL HIGH (ref 0.0–0.2)

## 2024-02-25 LAB — PROCALCITONIN: Procalcitonin: <0.1 ng/mL

## 2024-02-25 LAB — AMMONIA: Ammonia: <13 umol/L (ref 9–35)

## 2024-02-25 LAB — BRAIN NATRIURETIC PEPTIDE: B Natriuretic Peptide: 80.1 pg/mL (ref 0.0–100.0)

## 2024-02-25 LAB — PHOSPHORUS: Phosphorus: 3.5 mg/dL (ref 2.5–4.6)

## 2024-02-25 LAB — MAGNESIUM: Magnesium: 1.8 mg/dL (ref 1.7–2.4)

## 2024-02-25 MED ORDER — FERROUS SULFATE 325 (65 FE) MG PO TABS
325.0000 mg | ORAL_TABLET | Freq: Two times a day (BID) | ORAL | Status: DC
  Administered 2024-02-25 – 2024-02-27 (×4): 325 mg via ORAL
  Filled 2024-02-25 (×4): qty 1

## 2024-02-25 MED ORDER — CHLORDIAZEPOXIDE HCL 5 MG PO CAPS
10.0000 mg | ORAL_CAPSULE | Freq: Three times a day (TID) | ORAL | Status: DC
  Administered 2024-02-25 – 2024-02-27 (×7): 10 mg via ORAL
  Filled 2024-02-25 (×7): qty 2

## 2024-02-25 NOTE — Care Management Important Message (Signed)
 Important Message  Patient Details  Name: [name] MRN: [id number] Date of Birth: Jun 05, 1966   Important Message Given:  Yes - Medicare IM     Claretta Deed 02/25/2024, 4:06 PM

## 2024-02-25 NOTE — TOC Progression Note (Signed)
 Transition of Care [facility]) - Progression Note    Patient Details  Name: [name] MRN: [id number] Date of Birth: [birth year]  [facility] of [facility]) CM/SW Contact  Robynn Eileen Hoose, RN Phone Number: 02/25/2024, 2:06 PM  Clinical Narrative:   Patient given Medicare.gov list of HH agencies. Patient currently not using oxygen.          Expected Discharge Plan and Services                                               Social Determinants of Health (SDOH) Interventions SDOH Screenings   Food Insecurity: Food Insecurity Present (02/22/2024)  Housing: Low Risk  (02/22/2024)  Transportation Needs: No Transportation Needs (02/22/2024)  Utilities: Not At Risk (02/22/2024)  Alcohol  Screen: Medium Risk (06/14/2021)  Depression (PHQ2-9): Low Risk  (09/14/2021)  Financial Resource Strain: Medium Risk (09/06/2022)  Tobacco Use: High Risk (02/21/2024)    Readmission Risk Interventions     No data to display

## 2024-02-25 NOTE — Evaluation (Signed)
 Occupational Therapy Evaluation Patient Details Name: SIMUEL STEBNER MRN: [id number] DOB: 1966/02/08 Today's Date: 02/25/2024   History of Present Illness   58 yo male adm 02/21/24 with SOB, COPD exacerbation and ETOH withdrawal. PMHx: COPD, ETOH and tobacco use d/o, anxiety, depression, polyneuropathy     Clinical Impressions Pt reported they live with his sister and normally gets around with a walking stick, indep in ADLs and sister assists with groceries. At this time needed set up with containers for self feeding, min assist with UE dressing and simulated mod assist for LE ADLS. At this time recommendation for SNF but pt is declining but is open for [facility] services at dc. Acute Occupational Therapy to follow.      If plan is discharge home, recommend the following:   A little help with walking and/or transfers;A lot of help with bathing/dressing/bathroom;Assistance with cooking/housework;Assist for transportation;Help with stairs or ramp for entrance;Direct supervision/assist for financial management;Direct supervision/assist for medications management     Functional Status Assessment         Equipment Recommendations   Tub/shower seat     Recommendations for Other Services         Precautions/Restrictions   Precautions Precautions: Fall Recall of Precautions/Restrictions: Impaired Precaution/Restrictions Comments: CIWA Restrictions Weight Bearing Restrictions Per Provider Order: No     Mobility Bed Mobility               General bed mobility comments: Pt presented in chair    Transfers Overall transfer level: Needs assistance Equipment used: Rolling walker (2 wheels) Transfers: Sit to/from Stand Sit to Stand: Contact guard assist                  Balance Overall balance assessment: Needs assistance Sitting-balance support: Feet supported Sitting balance-Leahy Scale: Good     Standing balance support: Bilateral upper extremity  supported, During functional activity, Reliant on assistive device for balance Standing balance-Leahy Scale: Poor                             ADL either performed or assessed with clinical judgement   ADL Overall ADL's : Needs assistance/impaired Eating/Feeding: Set up;Sitting Eating/Feeding Details (indicate cue type and reason): needs assist opneing items Grooming: Wash/dry face;Set up;Sitting   Upper Body Bathing: Minimal assistance;Sitting;Moderate assistance   Lower Body Bathing: Moderate assistance   Upper Body Dressing : Minimal assistance;Sitting;Moderate assistance   Lower Body Dressing: Moderate assistance;Sit to/from stand               Functional mobility during ADLs: Contact guard assist;Rolling walker (2 wheels)       Vision   Vision Assessment?: Vision impaired- to be further tested in functional context Additional Comments: Pt reported wears glasses and does not have them at this time. Pt reported more blurry out of L eye.     Perception Perception: Within Functional Limits       Praxis         Pertinent Vitals/Pain Pain Assessment Pain Assessment: No/denies pain     Extremity/Trunk Assessment Upper Extremity Assessment Upper Extremity Assessment: Left hand dominant;RUE deficits/detail;LUE deficits/detail RUE Deficits / Details: Pt in BUE reported how they have had chronic limitations in AROM in B shoulders. Pt limited to about 15 degrees shoulder flexion/abduction. Pt very limited grip in B hands RUE Sensation: WNL RUE Coordination: decreased fine motor;decreased gross motor LUE Deficits / Details: Pt in BUE reported how they have had  chronic limitations in AROM in B shoulders. Pt limited to about 15 degrees shoulder flexion/abduction. Pt very limited grip in B hands LUE Sensation: WNL LUE Coordination: decreased fine motor;decreased gross motor   Lower Extremity Assessment Lower Extremity Assessment: Defer to PT evaluation        Communication Communication Communication: No apparent difficulties   Cognition Arousal: Alert Behavior During Therapy: WFL for tasks assessed/performed Cognition: Cognition impaired   Orientation impairments: Time Awareness: Online awareness impaired   Attention impairment (select first level of impairment): Divided attention Executive functioning impairment (select all impairments): Problem solving                   Following commands: Impaired Following commands impaired: Follows one step commands inconsistently     Cueing  General Comments   Cueing Techniques: Verbal cues;Gestural cues;Tactile cues      Exercises     Shoulder Instructions      Home Living Family/patient expects to be discharged to:: Private residence Living Arrangements: Other relatives Available Help at Discharge: Family Type of Home: House Home Access: Stairs to enter Secretary/administrator of Steps: 1   Home Layout: One level     Bathroom Shower/Tub: IT trainer: Standard Bathroom Accessibility: Yes How Accessible: Accessible via walker Home Equipment: Rolling Walker (2 wheels) (walking stick)          Prior Functioning/Environment Prior Level of Function : Independent/Modified Independent             Mobility Comments: pt reports ind at baseline ADLs Comments: indep    OT Problem List:     OT Treatment/Interventions:        OT Goals(Current goals can be found in the care plan section)       OT Frequency:  Min 2X/week    Co-evaluation              AM-PAC OT 6 Clicks Daily Activity     Outcome Measure Help from another person eating meals?: A Little Help from another person taking care of personal grooming?: A Little Help from another person toileting, which includes using toliet, bedpan, or urinal?: A Lot Help from another person bathing (including washing, rinsing, drying)?: A Lot Help from another person to put on  and taking off regular upper body clothing?: A Little Help from another person to put on and taking off regular lower body clothing?: A Lot 6 Click Score: 15   End of Session Equipment Utilized During Treatment: Gait belt;Rolling walker (2 wheels) Nurse Communication: Mobility status  Activity Tolerance: Patient limited by fatigue;Other (comment) (HR max 152) Patient left: in chair;with call bell/phone within reach;with chair alarm set  OT Visit Diagnosis: Unsteadiness on feet (R26.81);Other abnormalities of gait and mobility (R26.89);Muscle weakness (generalized) (M62.81)                Time: 9149-9067 OT Time Calculation (min): 42 min Charges:  OT General Charges $OT Visit: 1 Visit OT Evaluation $OT Eval Low Complexity: 1 Low OT Treatments $Self Care/Home Management : 23-37 mins  Warrick POUR OTR/L  Acute Rehab Services  [phone] office number   Warrick Berber 02/25/2024, 10:46 AM

## 2024-02-25 NOTE — Plan of Care (Signed)

## 2024-02-25 NOTE — Progress Notes (Signed)
 Physical Therapy Treatment Patient Details Name: [name] MRN: [id number] DOB: [birth year] Today's Date: 02/25/2024   History of Present Illness 58 yo male adm 02/21/24 with SOB, COPD exacerbation and ETOH withdrawal. PMHx: COPD, ETOH and tobacco use d/o, anxiety, depression, polyneuropathy    PT Comments  Pt with improved mobility and able to amb in hallway with CGA. Pt not interested in SNF placement but is open to [facility] therapy follow up. He reports his walker at home is in bad condition and he would like another one. He is unsure how long he has owned the walker.     If plan is discharge home, recommend the following: A little help with walking and/or transfers;A little help with bathing/dressing/bathroom;Assistance with cooking/housework;Direct supervision/assist for medications management;Assist for transportation;Help with stairs or ramp for entrance   Can travel by private vehicle     Yes  Equipment Recommendations  Rolling walker (2 wheels) (pt reports his is in bad condition)    Recommendations for Other Services       Precautions / Restrictions Precautions Precautions: Fall Recall of Precautions/Restrictions: Impaired Precaution/Restrictions Comments: CIWA Restrictions Weight Bearing Restrictions Per Provider Order: No     Mobility  Bed Mobility               General bed mobility comments: Pt up in chair    Transfers Overall transfer level: Needs assistance Equipment used: Rolling walker (2 wheels) Transfers: Sit to/from Stand Sit to Stand: Contact guard assist           General transfer comment: Verbal cues for hand placement and incr time to rise    Ambulation/Gait Ambulation/Gait assistance: Contact guard assist Gait Distance (Feet): 150 Feet Assistive device: Rolling walker (2 wheels) Gait Pattern/deviations: Step-through pattern, Decreased stride length Gait velocity: decr Gait velocity interpretation: 1.31 - 2.62 ft/sec, indicative of  limited community ambulator   General Gait Details: Assist for safety and to [employer] walker when turning around.   Stairs             Wheelchair Mobility     Tilt Bed    Modified Rankin (Stroke Patients Only)       Balance Overall balance assessment: Needs assistance Sitting-balance support: Feet supported Sitting balance-Leahy Scale: Good     Standing balance support: Bilateral upper extremity supported, During functional activity, Reliant on assistive device for balance Standing balance-Leahy Scale: Poor Standing balance comment: UE support                            Communication Communication Communication: No apparent difficulties  Cognition Arousal: Alert Behavior During Therapy: WFL for tasks assessed/performed   PT - Cognitive impairments: No family/caregiver present to determine baseline, Awareness, Orientation, Safety/Judgement, Problem solving                         Following commands: Impaired Following commands impaired: Only follows one step commands consistently    Cueing Cueing Techniques: Verbal cues, Gestural cues, Tactile cues  Exercises      General Comments General comments (skin integrity, edema, etc.): HR to 147 with amb      Pertinent Vitals/Pain      Home Living Family/patient expects to be discharged to:: Private residence Living Arrangements: Other relatives Available Help at Discharge: Family Type of Home: House Home Access: Stairs to enter   [employer] of Steps: 1   Home Layout: One level Home Equipment:  Rolling Walker (2 wheels) (walking stick)      Prior Function            PT Goals (current goals can now be found in the care plan section) Progress towards PT goals: Progressing toward goals;Goals updated    Frequency    Min 2X/week      PT Plan      Co-evaluation              AM-PAC PT 6 Clicks Mobility   Outcome Measure  Help needed turning from your  back to your side while in a flat bed without using bedrails?: A Little Help needed moving from lying on your back to sitting on the side of a flat bed without using bedrails?: A Little Help needed moving to and from a bed to a chair (including a wheelchair)?: A Little Help needed standing up from a chair using your arms (e.g., wheelchair or bedside chair)?: A Little Help needed to walk in hospital room?: A Little Help needed climbing 3-5 steps with a railing? : A Little 6 Click Score: 18    End of Session Equipment Utilized During Treatment: Gait belt Activity Tolerance: Patient tolerated treatment well Patient left: in chair;with call bell/phone within reach;with chair alarm set   PT Visit Diagnosis: Unsteadiness on feet (R26.81);Other abnormalities of gait and mobility (R26.89);Muscle weakness (generalized) (M62.81)     Time: 8971-8955 PT Time Calculation (min) (ACUTE ONLY): 16 min  Charges:    $Gait Training: 8-22 mins PT General Charges $$ ACUTE PT VISIT: 1 Visit                     [facility] PT Acute Rehabilitation Services Office [phone]    Rodgers ORN [facility] 02/25/2024, 11:36 AM

## 2024-02-25 NOTE — Progress Notes (Signed)
 PROGRESS NOTE  [name] FMW:[id number] DOB: 1966/06/22   PCP: Pcp, No  Patient is from: Home.  DOA: 02/21/2024 LOS: 4  Chief complaints Chief Complaint  Patient presents with   Shortness of Breath     Brief Narrative / Interim history: 58 year old M with PMH of asthma/COPD, alcohol  and tobacco use disorder, anxiety, depression, alcohol  induced polyneuropathy brought to ED by EMS with shortness of breath and found to be hypoxic to 86% on RA when fire fighter's found him.  He was admitted with acute respiratory failure with hypoxia due to COPD exacerbation and severe DTs, initially required brief hospital stay as well for phenobarb taper, now stabilized and transferred to my care on 02/25/2024 on day 4 of his hospital stay   Subjective:  Patient in bed, appears comfortable, denies any headache, no fever, no chest pain or pressure, no shortness of breath , no abdominal pain. No focal weakness.  Objective: Vitals:   02/25/24 0000 02/25/24 0400 02/25/24 0815 02/25/24 0846  BP: (!) 87/61 103/71  121/74  Pulse: 82 66  (!) 108  Resp: 16 15  (!) 27  Temp:      TempSrc:      SpO2: 100% 100% 99%   Weight:      Height:        Examination:  Awake Alert, No new F.N deficits, mild to moderate tremors of DTs [place].AT,PERRAL Supple Neck, No JVD,   Symmetrical Chest wall movement, Good air movement bilaterally, CTAB RRR,No Gallops, Rubs or new Murmurs,  +ve B.Sounds, Abd Soft, No tenderness,   No Cyanosis, Clubbing or edema    Consultants:  PCCM  Procedures: None  Microbiology summarized: COVID-19, influenza and RSV PCR nonreactive  Assessment and plan:  Acute COPD exacerbation:  -Continue ceftriaxone , Singulair  and as needed DuoNebs -Decrease Solu-Medrol  to 40 mg daily starting tomorrow - Continue Anoro Ellipta . -Encouraged smoking cessation, clinically improving continue to monitor encouraged to sit in chair use I-S and flutter valve for pulmonary toiletry.  Chest  x-ray nonacute.    EtoH withdrawal/delirium tremens: Severe withdrawals, currently on phenobarb taper, added low-dose Librium  along with as needed Ativan , continue folic acid  and IV thiamine , clinically improving.  Hyponatremia/hypokalemia/hypomagnesemia, hypophosphatemia: Hydrated and electrolytes replaced, improving  Iron  deficiency anemia: Present on admission, received Venofer  on 02/22/2024, now on oral iron  and folic acid  supplementation along with PPI, no signs of ongoing bleeding, postdischarge follow-up with PCP for age-appropriate outpatient iron  deficiency anemia workup  Tobacco use disorder - Nicotine  patch - Counseled to quit  Lactic acidosis: Likely due to alcohol  dehydration, hydrated and improving clinically.  Physical deconditioning - PT/OT  Body mass index is 20.67 kg/m.          DVT prophylaxis:  SCDs Start: 02/21/24 0407  Code Status: Full code Family Communication: Attempted to call patient's sister, Orlean over the phone but no answer. Level of care: Progressive Status is: Inpatient Remains inpatient appropriate because: Alcohol  withdrawal/delirium tremens   Final disposition: To be determined  Sch Meds:  Scheduled Meds:  arformoterol   15 mcg Nebulization BID   budesonide  (PULMICORT ) nebulizer solution  0.5 mg Nebulization BID   chlordiazePOXIDE   10 mg Oral TID   Chlorhexidine  Gluconate Cloth  6 each Topical Daily   ferrous sulfate   325 mg Oral BID WC   folic acid   1 mg Oral Daily   methylPREDNISolone   40 mg Oral Once   montelukast   10 mg Oral QHS   multivitamin with minerals  1 tablet Oral Daily  nicotine   14 mg Transdermal Daily   pantoprazole   40 mg Oral Daily   phenobarbital   64.8 mg Oral Q8H   Followed by   NOREEN ON 02/27/2024] phenobarbital   32.4 mg Oral Q8H   sodium chloride  flush  3 mL Intravenous Q12H   thiamine   100 mg Intravenous Daily   Continuous Infusions:  cefTRIAXone  (ROCEPHIN )  IV 1 g (02/24/24 1322)   PRN  Meds:.acetaminophen , ipratropium-albuterol , loperamide , LORazepam , melatonin, ondansetron  (ZOFRAN ) IV, ondansetron , polyethylene glycol   Data Review:     Recent Labs  Lab 02/21/24 0127 02/22/24 1215 02/23/24 0835 02/24/24 0342 02/25/24 0552  WBC 4.4 7.6 8.3 5.9 6.4  HGB 7.9* 7.9* 7.1* 7.0* 7.6*  HCT 23.3* 24.2* 22.1* 21.8* 23.8*  PLT 209 219 198 165 184  MCV 83.2 85.8 86.0 88.6 88.8  MCH 28.2 28.0 27.6 28.5 28.4  MCHC 33.9 32.6 32.1 32.1 31.9  RDW 17.7* 18.6* 18.9* 19.4* 19.2*  LYMPHSABS  --   --   --   --  1.1  MONOABS  --   --   --   --  0.7  EOSABS  --   --   --   --  0.1  BASOSABS  --   --   --   --  0.0    Recent Labs  Lab 02/21/24 0127 02/21/24 0459 02/21/24 0502 02/21/24 0700 02/22/24 1215 02/22/24 1541 02/23/24 0835 02/23/24 1038 02/24/24 0342 02/25/24 0552 02/25/24 0649  NA  --   --  130*  --  138  --  143  --  139 134*  --   K  --   --  3.2*  --  3.6  --  3.5  --  3.0* 4.0  --   CL  --   --  92*  --  101  --  110  --  108 99  --   CO2  --   --  20*  --  22  --  20*  --  22 24  --   ANIONGAP  --   --  18*  --  15  --  13  --  9 11  --   GLUCOSE  --   --  99  --  116*  --  83  --  78 93  --   BUN  --   --  <5*  --  6  --  10  --  9 12  --   CREATININE  --   --  0.61  --  0.74  --  1.03  --  0.55* 0.64  --   AST  --  73*  --   --  50*  --  46*  --   --  36  --   ALT  --  33  --   --  29  --  25  --   --  26  --   ALKPHOS  --  58  --   --  61  --  48  --   --  43  --   BILITOT  --  0.3  --   --  0.9  --  0.7  --   --  0.5  --   ALBUMIN  --  3.5  --   --  3.8  --  3.4*  --   --  3.1*  --   CRP  --   --   --   --   --   --   --   --   --  <  0.5  --   PROCALCITON  --   --   --   --   --   --   --   --   --  <0.10  --   LATICACIDVEN   < >  --  4.3* 4.3* 3.0* 4.4* 2.2* 1.5  --   --   --   AMMONIA  --   --   --   --   --   --   --   --   --   --  <13  BNP  --   --   --   --   --   --   --   --   --  80.1  --   MG  --   --  1.4*  --  2.1  --  2.0  --  1.9 1.8   --   PHOS  --   --  5.3*  --  4.0  --  3.6  --  4.5 3.5  --   CALCIUM  --   --  8.7*  --  9.4  --  8.9  --  8.4* 8.8*  --    < > = values in this interval not displayed.      Recent Labs  Lab 02/21/24 0502 02/21/24 0700 02/22/24 1215 02/22/24 1541 02/23/24 0835 02/23/24 1038 02/24/24 0342 02/25/24 0552 02/25/24 0649  CRP  --   --   --   --   --   --   --  <0.5  --   PROCALCITON  --   --   --   --   --   --   --  <0.10  --   LATICACIDVEN 4.3* 4.3* 3.0* 4.4* 2.2* 1.5  --   --   --   AMMONIA  --   --   --   --   --   --   --   --  <13  BNP  --   --   --   --   --   --   --  80.1  --   MG 1.4*  --  2.1  --  2.0  --  1.9 1.8  --   CALCIUM 8.7*  --  9.4  --  8.9  --  8.4* 8.8*  --     Micro Results Recent Results (from the past 240 hours)  Resp panel by RT-PCR (RSV, Flu A&B, Covid) Anterior Nasal Swab     Status: None   Collection Time: 02/21/24  1:27 AM   Specimen: Anterior Nasal Swab  Result Value Ref Range Status   SARS Coronavirus 2 by RT PCR NEGATIVE NEGATIVE Final   Influenza A by PCR NEGATIVE NEGATIVE Final   Influenza B by PCR NEGATIVE NEGATIVE Final    Comment: (NOTE) The Xpert Xpress SARS-CoV-2/FLU/RSV plus assay is intended as an aid in the diagnosis of influenza from Nasopharyngeal swab specimens and should not be used as a sole basis for treatment. Nasal washings and aspirates are unacceptable for Xpert Xpress SARS-CoV-2/FLU/RSV testing.  Fact Sheet for Patients: BloggerCourse.com  Fact Sheet for Healthcare Providers: SeriousBroker.it  This test is not yet approved or cleared by the [place]  FDA and has been authorized for detection and/or diagnosis of SARS-CoV-2 by FDA under an Emergency Use Authorization (EUA). This EUA will remain in effect (meaning this test can be used) for the duration of the COVID-19 declaration under Section 564(b)(1) of the Act,  21 U.S.C. section 360bbb-3(b)(1), unless the  authorization is terminated or revoked.     Resp Syncytial Virus by PCR NEGATIVE NEGATIVE Final    Comment: (NOTE) Fact Sheet for Patients: BloggerCourse.com  Fact Sheet for Healthcare Providers: SeriousBroker.it  This test is not yet approved or cleared by the [place]  FDA and has been authorized for detection and/or diagnosis of SARS-CoV-2 by FDA under an Emergency Use Authorization (EUA). This EUA will remain in effect (meaning this test can be used) for the duration of the COVID-19 declaration under Section 564(b)(1) of the Act, 21 U.S.C. section 360bbb-3(b)(1), unless the authorization is terminated or revoked.  Performed at [facility], [address], [place], [place] [address]   MRSA Next Gen by PCR, Nasal     Status: None   Collection Time: 02/23/24  9:03 AM   Specimen: Nasal Mucosa; Nasal Swab  Result Value Ref Range Status   MRSA by PCR Next Gen NOT DETECTED NOT DETECTED Final    Comment: (NOTE) The GeneXpert MRSA Assay (FDA approved for NASAL specimens only), is one component of a comprehensive MRSA colonization surveillance program. It is not intended to diagnose MRSA infection nor to guide or monitor treatment for MRSA infections. Test performance is not FDA approved in patients less than 64 years old. Performed at [facility], [address], [place], [place] [address]     Radiology Reports  DG Chest [place] 1 View Result Date: 02/25/2024 CLINICAL DATA:  Shortness of breath. EXAM: PORTABLE CHEST 1 VIEW COMPARISON:  02/21/2024 FINDINGS: Cardiopericardial silhouette is at upper limits of normal for size. The lungs are clear without focal pneumonia, edema, pneumothorax or pleural effusion. Streaky/linear density at the bases suggest atelectasis. No acute bony abnormality. Telemetry leads overlie the chest. IMPRESSION: Streaky/linear density at the bases suggest atelectasis. Electronically Signed    By: Camellia Candle M.D.   On: 02/25/2024 06:44     Signature  -   Lavada Stank M.D on 02/25/2024 at 9:26 AM   -  To page go to [email or web address]

## 2024-02-26 DIAGNOSIS — F10931 Alcohol use, unspecified with withdrawal delirium: Secondary | ICD-10-CM | POA: Diagnosis not present

## 2024-02-26 LAB — COMPREHENSIVE METABOLIC PANEL WITH GFR
ALT: 26 U/L (ref 0–44)
AST: 32 U/L (ref 15–41)
Albumin: 3.1 g/dL — ABNORMAL LOW (ref 3.5–5.0)
Alkaline Phosphatase: 42 U/L (ref 38–126)
Anion gap: 7 (ref 5–15)
BUN: 6 mg/dL (ref 6–20)
CO2: 24 mmol/L (ref 22–32)
Calcium: 8.8 mg/dL — ABNORMAL LOW (ref 8.9–10.3)
Chloride: 103 mmol/L (ref 98–111)
Creatinine, Ser: 0.51 mg/dL — ABNORMAL LOW (ref 0.61–1.24)
GFR, Estimated: >60 mL/min (ref >60)
Glucose, Bld: 92 mg/dL (ref 70–99)
Potassium: 3.4 mmol/L — ABNORMAL LOW (ref 3.5–5.1)
Sodium: 134 mmol/L — ABNORMAL LOW (ref 135–145)
Total Bilirubin: 0.5 mg/dL (ref 0.0–1.2)
Total Protein: 6.1 g/dL — ABNORMAL LOW (ref 6.5–8.1)

## 2024-02-26 LAB — TYPE AND SCREEN
ABO/RH(D): O POS
Antibody Screen: NEGATIVE

## 2024-02-26 LAB — CBC WITH DIFFERENTIAL/PLATELET
Abs Immature Granulocytes: 0.04 K/uL (ref 0.00–0.07)
Basophils Absolute: 0 K/uL (ref 0.0–0.1)
Basophils Relative: 0 %
Eosinophils Absolute: 0.1 K/uL (ref 0.0–0.5)
Eosinophils Relative: 2 %
HCT: 22.7 % — ABNORMAL LOW (ref 39.0–52.0)
Hemoglobin: 7.5 g/dL — ABNORMAL LOW (ref 13.0–17.0)
Immature Granulocytes: 1 %
Lymphocytes Relative: 16 %
Lymphs Abs: 1 K/uL (ref 0.7–4.0)
MCH: 28.6 pg (ref 26.0–34.0)
MCHC: 33 g/dL (ref 30.0–36.0)
MCV: 86.6 fL (ref 80.0–100.0)
Monocytes Absolute: 1.1 K/uL — ABNORMAL HIGH (ref 0.1–1.0)
Monocytes Relative: 17 %
Neutro Abs: 4.1 K/uL (ref 1.7–7.7)
Neutrophils Relative %: 64 %
Platelets: 169 K/uL (ref 150–400)
RBC: 2.62 MIL/uL — ABNORMAL LOW (ref 4.22–5.81)
RDW: 18.9 % — ABNORMAL HIGH (ref 11.5–15.5)
WBC: 6.3 K/uL (ref 4.0–10.5)
nRBC: 0.5 % — ABNORMAL HIGH (ref 0.0–0.2)

## 2024-02-26 LAB — MAGNESIUM: Magnesium: 1.6 mg/dL — ABNORMAL LOW (ref 1.7–2.4)

## 2024-02-26 LAB — PHOSPHORUS: Phosphorus: 3.9 mg/dL (ref 2.5–4.6)

## 2024-02-26 MED ORDER — MAGNESIUM SULFATE 4 GM/100ML IV SOLN
4.0000 g | Freq: Once | INTRAVENOUS | Status: AC
  Administered 2024-02-26: 4 g via INTRAVENOUS
  Filled 2024-02-26: qty 100

## 2024-02-26 MED ORDER — POTASSIUM CHLORIDE CRYS ER 20 MEQ PO TBCR
40.0000 meq | EXTENDED_RELEASE_TABLET | Freq: Once | ORAL | Status: AC
  Administered 2024-02-26: 40 meq via ORAL
  Filled 2024-02-26: qty 2

## 2024-02-26 MED ORDER — THIAMINE MONONITRATE 100 MG PO TABS
100.0000 mg | ORAL_TABLET | Freq: Every day | ORAL | Status: DC
  Administered 2024-02-27: 100 mg via ORAL
  Filled 2024-02-26: qty 1

## 2024-02-26 NOTE — Progress Notes (Signed)
 Physical Therapy Treatment Patient Details Name: [name] MRN: [id number] DOB: [birth year] Today's Date: 02/26/2024   History of Present Illness 58 yo male adm 02/21/24 with SOB, COPD exacerbation and ETOH withdrawal. PMHx: COPD, ETOH and tobacco use d/o, anxiety, depression, polyneuropathy    PT Comments  Pt making steady progress with mobility. Pt declines SNF. Recommend HHPT. Will always be a fall risk due to etoh use.     If plan is discharge home, recommend the following: A little help with bathing/dressing/bathroom;Assistance with cooking/housework;Direct supervision/assist for medications management;Direct supervision/assist for financial management;Assist for transportation;Help with stairs or ramp for entrance   Can travel by private vehicle        Equipment Recommendations  Rolling walker (2 wheels) (Pt reports his walker is in bad condition)    Recommendations for Other Services       Precautions / Restrictions Precautions Precautions: Fall Recall of Precautions/Restrictions: Impaired Precaution/Restrictions Comments: CIWA Restrictions Weight Bearing Restrictions Per Provider Order: No     Mobility  Bed Mobility Overal bed mobility: Needs Assistance Bed Mobility: Supine to Sit     Supine to sit: Contact guard     General bed mobility comments: Incr time    Transfers Overall transfer level: Needs assistance Equipment used: Rolling walker (2 wheels) Transfers: Sit to/from Stand Sit to Stand: Contact guard assist, Supervision           General transfer comment: Progressed to supervision with bed elevated 1    Ambulation/Gait Ambulation/Gait assistance: Supervision Gait Distance (Feet): 350 Feet Assistive device: Rolling walker (2 wheels) Gait Pattern/deviations: Step-through pattern, Decreased stride length Gait velocity: decr Gait velocity interpretation: 1.31 - 2.62 ft/sec, indicative of limited community ambulator   General Gait Details:  Assist for safety. Verbal cues to keep feet inside walker base when manuvering in small space.   Stairs             Wheelchair Mobility     Tilt Bed    Modified Rankin (Stroke Patients Only)       Balance Overall balance assessment: Needs assistance Sitting-balance support: Feet supported Sitting balance-Leahy Scale: Good     Standing balance support: Bilateral upper extremity supported, During functional activity, Reliant on assistive device for balance Standing balance-Leahy Scale: Poor Standing balance comment: UE support                            Communication Communication Communication: No apparent difficulties  Cognition Arousal: Alert Behavior During Therapy: WFL for tasks assessed/performed   PT - Cognitive impairments: No family/caregiver present to determine baseline, Problem solving, Safety/Judgement                         Following commands: Impaired Following commands impaired: Only follows one step commands consistently    Cueing Cueing Techniques: Verbal cues, Gestural cues, Tactile cues  Exercises      General Comments General comments (skin integrity, edema, etc.): HR 140's with activity      Pertinent Vitals/Pain      Home Living                          Prior Function            PT Goals (current goals can now be found in the care plan section) Progress towards PT goals: Progressing toward goals    Frequency    Min  2X/week      PT Plan      Co-evaluation              AM-PAC PT 6 Clicks Mobility   Outcome Measure  Help needed turning from your back to your side while in a flat bed without using bedrails?: A Little Help needed moving from lying on your back to sitting on the side of a flat bed without using bedrails?: A Little Help needed moving to and from a bed to a chair (including a wheelchair)?: A Little Help needed standing up from a chair using your arms (e.g.,  wheelchair or bedside chair)?: A Little Help needed to walk in hospital room?: A Little Help needed climbing 3-5 steps with a railing? : A Little 6 Click Score: 18    End of Session   Activity Tolerance: Patient tolerated treatment well Patient left: in chair;with call bell/phone within reach;with chair alarm set Nurse Communication: Mobility status PT Visit Diagnosis: Unsteadiness on feet (R26.81);Other abnormalities of gait and mobility (R26.89);Muscle weakness (generalized) (M62.81)     Time: 8785-8763 PT Time Calculation (min) (ACUTE ONLY): 22 min  Charges:    $Gait Training: 8-22 mins PT General Charges $$ ACUTE PT VISIT: 1 Visit                     [facility] PT Acute Rehabilitation Services Office [phone]    Rodgers ORN [facility] 02/26/2024, 2:34 PM

## 2024-02-26 NOTE — Progress Notes (Signed)
 PROGRESS NOTE  [name] FMW:[id number] DOB: 1966-03-07   PCP: Pcp, No  Patient is from: Home.  DOA: 02/21/2024 LOS: 5  Chief complaints Chief Complaint  Patient presents with   Shortness of Breath     Brief Narrative / Interim history: 58 year old M with PMH of asthma/COPD, alcohol  and tobacco use disorder, anxiety, depression, alcohol  induced polyneuropathy brought to ED by EMS with shortness of breath and found to be hypoxic to 86% on RA when fire fighter's found him.  He was admitted with acute respiratory failure with hypoxia due to COPD exacerbation and severe DTs, initially required brief hospital stay as well for phenobarb taper, now stabilized and transferred to my care on 02/25/2024 on day 4 of his hospital stay   Subjective:  Patient in bed, appears comfortable, denies any headache, no fever, no chest pain or pressure, no shortness of breath , no abdominal pain. No focal weakness.  Objective: Vitals:   02/26/24 0000 02/26/24 0400 02/26/24 0809 02/26/24 0840  BP: 119/75 106/68  127/83  Pulse: 74 71  (!) 127  Resp: 15 15  18   Temp: 98.9 F (37.2 C) 97.7 F (36.5 C)  98 F (36.7 C)  TempSrc: Oral Oral  Oral  SpO2: 100% 99% 100% 100%  Weight:      Height:        Examination:  Awake Alert, No new F.N deficits, minimal tremors of DTs, much improved [place].AT,PERRAL Supple Neck, No JVD,   Symmetrical Chest wall movement, Good air movement bilaterally, CTAB RRR,No Gallops, Rubs or new Murmurs,  +ve B.Sounds, Abd Soft, No tenderness,   No Cyanosis, Clubbing or edema    Consultants:  PCCM  Procedures: None  Microbiology summarized: COVID-19, influenza and RSV PCR nonreactive  Assessment and plan:  Acute COPD exacerbation:  -Continue ceftriaxone , Singulair  and as needed DuoNebs - Continue to taper off Solu-Medrol  quickly - Continue Anoro Ellipta . -Encouraged smoking cessation, clinically improving continue to monitor encouraged to sit in chair use I-S  and flutter valve for pulmonary toiletry.  Chest x-ray nonacute.    EtoH withdrawal/severe delirium tremens: Severe withdrawals, currently on phenobarb taper, added low-dose Librium  along with as needed Ativan , continue folic acid  and IV thiamine , clinically improving.  Hyponatremia/hypokalemia/hypomagnesemia, hypophosphatemia: Hydrated and electrolytes replaced, improving  Iron  deficiency anemia: Present on admission, received Venofer  on 02/22/2024, now on oral iron  and folic acid  supplementation along with PPI, no signs of ongoing bleeding, postdischarge follow-up with PCP for age-appropriate outpatient iron  deficiency anemia workup  Tobacco use disorder - Nicotine  patch - Counseled to quit  Lactic acidosis: Likely due to alcohol  dehydration, hydrated and improving clinically.  Physical deconditioning - PT/OT  Body mass index is 20.67 kg/m.          DVT prophylaxis:  SCDs Start: 02/21/24 0407  Code Status: Full code Family Communication: Attempted to call patient's sister, Orlean over the phone but no answer. Level of care: Progressive Status is: Inpatient Remains inpatient appropriate because: Alcohol  withdrawal/delirium tremens   Final disposition: To be determined  Sch Meds:  Scheduled Meds:  arformoterol   15 mcg Nebulization BID   budesonide  (PULMICORT ) nebulizer solution  0.5 mg Nebulization BID   chlordiazePOXIDE   10 mg Oral TID   Chlorhexidine  Gluconate Cloth  6 each Topical Daily   ferrous sulfate   325 mg Oral BID WC   folic acid   1 mg Oral Daily   montelukast   10 mg Oral QHS   multivitamin with minerals  1 tablet Oral Daily  nicotine   14 mg Transdermal Daily   pantoprazole   40 mg Oral Daily   phenobarbital   64.8 mg Oral Q8H   Followed by   NOREEN ON 02/27/2024] phenobarbital   32.4 mg Oral Q8H   potassium chloride   40 mEq Oral Once   sodium chloride  flush  3 mL Intravenous Q12H   thiamine   100 mg Intravenous Daily   Continuous Infusions:  magnesium   sulfate bolus IVPB     PRN Meds:.acetaminophen , ipratropium-albuterol , LORazepam , melatonin, ondansetron  (ZOFRAN ) IV, polyethylene glycol   Data Review:     Recent Labs  Lab 02/22/24 1215 02/23/24 0835 02/24/24 0342 02/25/24 0552 02/26/24 0605  WBC 7.6 8.3 5.9 6.4 6.3  HGB 7.9* 7.1* 7.0* 7.6* 7.5*  HCT 24.2* 22.1* 21.8* 23.8* 22.7*  PLT 219 198 165 184 169  MCV 85.8 86.0 88.6 88.8 86.6  MCH 28.0 27.6 28.5 28.4 28.6  MCHC 32.6 32.1 32.1 31.9 33.0  RDW 18.6* 18.9* 19.4* 19.2* 18.9*  LYMPHSABS  --   --   --  1.1 1.0  MONOABS  --   --   --  0.7 1.1*  EOSABS  --   --   --  0.1 0.1  BASOSABS  --   --   --  0.0 0.0    Recent Labs  Lab 02/21/24 0459 02/21/24 0502 02/21/24 0700 02/22/24 1215 02/22/24 1541 02/23/24 0835 02/23/24 1038 02/24/24 0342 02/25/24 0552 02/25/24 0649 02/26/24 0605  NA  --    < >  --  138  --  143  --  139 134*  --  134*  K  --    < >  --  3.6  --  3.5  --  3.0* 4.0  --  3.4*  CL  --    < >  --  101  --  110  --  108 99  --  103  CO2  --    < >  --  22  --  20*  --  22 24  --  24  ANIONGAP  --    < >  --  15  --  13  --  9 11  --  7  GLUCOSE  --    < >  --  116*  --  83  --  78 93  --  92  BUN  --    < >  --  6  --  10  --  9 12  --  6  CREATININE  --    < >  --  0.74  --  1.03  --  0.55* 0.64  --  0.51*  AST 73*  --   --  50*  --  46*  --   --  36  --  32  ALT 33  --   --  29  --  25  --   --  26  --  26  ALKPHOS 58  --   --  61  --  48  --   --  43  --  42  BILITOT 0.3  --   --  0.9  --  0.7  --   --  0.5  --  0.5  ALBUMIN 3.5  --   --  3.8  --  3.4*  --   --  3.1*  --  3.1*  CRP  --   --   --   --   --   --   --   --  <  0.5  --   --   PROCALCITON  --   --   --   --   --   --   --   --  <0.10  --   --   LATICACIDVEN  --    < > 4.3* 3.0* 4.4* 2.2* 1.5  --   --   --   --   AMMONIA  --   --   --   --   --   --   --   --   --  <13  --   BNP  --   --   --   --   --   --   --   --  80.1  --   --   MG  --    < >  --  2.1  --  2.0  --  1.9 1.8  --   1.6*  PHOS  --    < >  --  4.0  --  3.6  --  4.5 3.5  --  3.9  CALCIUM  --    < >  --  9.4  --  8.9  --  8.4* 8.8*  --  8.8*   < > = values in this interval not displayed.      Recent Labs  Lab 02/21/24 0700 02/22/24 1215 02/22/24 1541 02/23/24 0835 02/23/24 1038 02/24/24 0342 02/25/24 0552 02/25/24 0649 02/26/24 0605  CRP  --   --   --   --   --   --  <0.5  --   --   PROCALCITON  --   --   --   --   --   --  <0.10  --   --   LATICACIDVEN 4.3* 3.0* 4.4* 2.2* 1.5  --   --   --   --   AMMONIA  --   --   --   --   --   --   --  <13  --   BNP  --   --   --   --   --   --  80.1  --   --   MG  --  2.1  --  2.0  --  1.9 1.8  --  1.6*  CALCIUM  --  9.4  --  8.9  --  8.4* 8.8*  --  8.8*    Micro Results Recent Results (from the past 240 hours)  Resp panel by RT-PCR (RSV, Flu A&B, Covid) Anterior Nasal Swab     Status: None   Collection Time: 02/21/24  1:27 AM   Specimen: Anterior Nasal Swab  Result Value Ref Range Status   SARS Coronavirus 2 by RT PCR NEGATIVE NEGATIVE Final   Influenza A by PCR NEGATIVE NEGATIVE Final   Influenza B by PCR NEGATIVE NEGATIVE Final    Comment: (NOTE) The Xpert Xpress SARS-CoV-2/FLU/RSV plus assay is intended as an aid in the diagnosis of influenza from Nasopharyngeal swab specimens and should not be used as a sole basis for treatment. Nasal washings and aspirates are unacceptable for Xpert Xpress SARS-CoV-2/FLU/RSV testing.  Fact Sheet for Patients: BloggerCourse.com  Fact Sheet for Healthcare Providers: SeriousBroker.it  This test is not yet approved or cleared by the [place]  FDA and has been authorized for detection and/or diagnosis of SARS-CoV-2 by FDA under an Emergency Use Authorization (EUA). This EUA will remain in effect (meaning this test can be used) for the duration  of the COVID-19 declaration under Section 564(b)(1) of the Act, 21 U.S.C. section 360bbb-3(b)(1), unless the  authorization is terminated or revoked.     Resp Syncytial Virus by PCR NEGATIVE NEGATIVE Final    Comment: (NOTE) Fact Sheet for Patients: BloggerCourse.com  Fact Sheet for Healthcare Providers: SeriousBroker.it  This test is not yet approved or cleared by the [place]  FDA and has been authorized for detection and/or diagnosis of SARS-CoV-2 by FDA under an Emergency Use Authorization (EUA). This EUA will remain in effect (meaning this test can be used) for the duration of the COVID-19 declaration under Section 564(b)(1) of the Act, 21 U.S.C. section 360bbb-3(b)(1), unless the authorization is terminated or revoked.  Performed at [facility], [address], [place], [place] [address]   MRSA Next Gen by PCR, Nasal     Status: None   Collection Time: 02/23/24  9:03 AM   Specimen: Nasal Mucosa; Nasal Swab  Result Value Ref Range Status   MRSA by PCR Next Gen NOT DETECTED NOT DETECTED Final    Comment: (NOTE) The GeneXpert MRSA Assay (FDA approved for NASAL specimens only), is one component of a comprehensive MRSA colonization surveillance program. It is not intended to diagnose MRSA infection nor to guide or monitor treatment for MRSA infections. Test performance is not FDA approved in patients less than 35 years old. Performed at [facility], [address] [place], [place] [address]     Radiology Reports  DG Chest [place] 1 View Result Date: 02/25/2024 CLINICAL DATA:  Shortness of breath. EXAM: PORTABLE CHEST 1 VIEW COMPARISON:  02/21/2024 FINDINGS: Cardiopericardial silhouette is at upper limits of normal for size. The lungs are clear without focal pneumonia, edema, pneumothorax or pleural effusion. Streaky/linear density at the bases suggest atelectasis. No acute bony abnormality. Telemetry leads overlie the chest. IMPRESSION: Streaky/linear density at the bases suggest atelectasis. Electronically Signed    By: Camellia Candle M.D.   On: 02/25/2024 06:44     Signature  -   Lavada Stank M.D on 02/26/2024 at 9:27 AM   -  To page go to [email or web address]

## 2024-02-26 NOTE — Plan of Care (Signed)

## 2024-02-26 NOTE — Plan of Care (Signed)
   Problem: Activity: Goal: Risk for activity intolerance will decrease Outcome: Progressing   Problem: Nutrition: Goal: Adequate nutrition will be maintained Outcome: Progressing   Problem: Pain Managment: Goal: General experience of comfort will improve and/or be controlled Outcome: Progressing   Problem: Safety: Goal: Ability to remain free from injury will improve Outcome: Progressing

## 2024-02-26 NOTE — Progress Notes (Signed)
   02/26/24 0935  Mobility  Activity Ambulated with assistance in hallway  Level of Assistance Standby assist, set-up cues, supervision of patient - no hands on  Assistive Device Front wheel walker  Distance Ambulated (ft) 300 ft  Activity Response Tolerated well  Mobility Referral Yes  Mobility visit 1 Mobility  Mobility Specialist Start Time (ACUTE ONLY) 0935  Mobility Specialist Stop Time (ACUTE ONLY) 0948  Mobility Specialist Time Calculation (min) (ACUTE ONLY) 13 min   Mobility Specialist: Progress Note  Pre-Mobility:      HR 103 During Mobility: HR 133, SpO2 100% RA Post-Mobility:    HR 115  Pt agreeable to mobility session - received in bed. Pt was asymptomatic throughout session with no complaints. Pt denied SOB throughout ambulation. Pt stated his BLE's feel strong. Returned to bed with all needs met - call bell within reach. Bed alarm on.   Virgle Boards, BS Mobility Specialist Please contact via SecureChat or  Rehab office at [phone].

## 2024-02-26 NOTE — Progress Notes (Signed)
 Occupational Therapy Treatment Patient Details Name: [name] MRN: [id number] DOB: January 30, 1966 Today's Date: 02/26/2024   History of present illness 58 yo male adm 02/21/24 with SOB, COPD exacerbation and ETOH withdrawal. PMHx: COPD, ETOH and tobacco use d/o, anxiety, depression, polyneuropathy   OT comments  Pt presented in bed an noted to have decrease in shaking today to completed tasks and feeling better. He was able to complete sit to stand transfers with CGA, ambulation with CGA to supervision in small areas, CGA to min assist with UE ADLS and min assist for LE ADLS while sitting to standing at sink level. Still recommendation for SNF but pt declining but agreeable to [facility] therapy.        If plan is discharge home, recommend the following:  A little help with walking and/or transfers;A lot of help with bathing/dressing/bathroom;Assistance with cooking/housework;Assist for transportation;Help with stairs or ramp for entrance;Direct supervision/assist for financial management;Direct supervision/assist for medications management   Equipment Recommendations  Tub/shower bench (RW)    Recommendations for Other Services      Precautions / Restrictions Precautions Precautions: Fall Recall of Precautions/Restrictions: Impaired Precaution/Restrictions Comments: CIWA Restrictions Weight Bearing Restrictions Per Provider Order: No       Mobility Bed Mobility Overal bed mobility: Needs Assistance Bed Mobility: Supine to Sit, Sit to Supine     Supine to sit: Contact guard Sit to supine: Contact guard assist        Transfers Overall transfer level: Needs assistance Equipment used: Rolling walker (2 wheels) Transfers: Sit to/from Stand Sit to Stand: Contact guard assist           General transfer comment: cues on positioning     Balance Overall balance assessment: Needs assistance Sitting-balance support: Feet supported Sitting balance-Leahy Scale: Good      Standing balance support: Bilateral upper extremity supported, During functional activity, Reliant on assistive device for balance Standing balance-Leahy Scale: Poor Standing balance comment: UE support                           ADL either performed or assessed with clinical judgement   ADL Overall ADL's : Needs assistance/impaired Eating/Feeding: Set up;Sitting Eating/Feeding Details (indicate cue type and reason): pt noted that they could open items more today and noted decrease in shaking from prior session Grooming: Wash/dry face;Set up;Sitting   Upper Body Bathing: Contact guard assist;Minimal assistance;Sitting   Lower Body Bathing: Minimal assistance;Sitting/lateral leans;Sit to/from stand   Upper Body Dressing : Contact guard assist;Sitting   Lower Body Dressing: Minimal assistance;Sit to/from stand;Sitting/lateral leans       Toileting- Clothing Manipulation and Hygiene: Contact guard assist;Sit to/from stand       Functional mobility during ADLs: Contact guard assist;Rolling walker (2 wheels)      Extremity/Trunk Assessment Upper Extremity Assessment Upper Extremity Assessment: Left hand dominant;RUE deficits/detail;LUE deficits/detail RUE Deficits / Details: Pt in BUE reported how they have had chronic limitations in AROM in B shoulders. Pt limited to about 15 degrees shoulder flexion/abduction. Pt very limited grip in B hands RUE Sensation: WNL RUE Coordination: decreased fine motor;decreased gross motor LUE Deficits / Details: Pt in BUE reported how they have had chronic limitations in AROM in B shoulders. Pt limited to about 15 degrees shoulder flexion/abduction. Pt very limited grip in B hands LUE Sensation: WNL LUE Coordination: decreased fine motor;decreased gross motor   Lower Extremity Assessment Lower Extremity Assessment: Defer to PT evaluation  Vision   Vision Assessment?: Vision impaired- to be further tested in functional  context Additional Comments: Pt wears glasses at baseline, L side more impaired vs R   Perception Perception Perception: Within Functional Limits   Praxis Praxis Praxis: WFL   Communication Communication Communication: No apparent difficulties   Cognition Arousal: Alert Behavior During Therapy: WFL for tasks assessed/performed                                 Following commands: Impaired Following commands impaired: Only follows one step commands consistently      Cueing   Cueing Techniques: Verbal cues, Gestural cues, Tactile cues  Exercises      Shoulder Instructions       General Comments max hr 142    Pertinent Vitals/ Pain       Pain Assessment Pain Assessment: No/denies pain  Home Living                                          Prior Functioning/Environment              Frequency  Min 2X/week        Progress Toward Goals  OT Goals(current goals can now be found in the care plan section)  Progress towards OT goals: Progressing toward goals  ADL Goals Pt Will Perform Upper Body Bathing: with modified independence;sitting Pt Will Perform Lower Body Bathing: with contact guard assist;with adaptive equipment;sit to/from stand Pt Will Perform Upper Body Dressing: with modified independence;sitting Pt Will Perform Lower Body Dressing: with contact guard assist;sit to/from stand Pt Will Transfer to Toilet: with supervision;ambulating  Plan      Co-evaluation                 AM-PAC OT 6 Clicks Daily Activity     Outcome Measure   Help from another person eating meals?: A Little Help from another person taking care of personal grooming?: A Little Help from another person toileting, which includes using toliet, bedpan, or urinal?: A Lot Help from another person bathing (including washing, rinsing, drying)?: A Lot Help from another person to put on and taking off regular upper body clothing?: A Little Help  from another person to put on and taking off regular lower body clothing?: A Lot 6 Click Score: 15    End of Session Equipment Utilized During Treatment: Gait belt;Rolling walker (2 wheels)  OT Visit Diagnosis: Unsteadiness on feet (R26.81);Other abnormalities of gait and mobility (R26.89);Muscle weakness (generalized) (M62.81)   Activity Tolerance Patient tolerated treatment well   Patient Left in bed;with call bell/phone within reach;with bed alarm set   Nurse Communication Mobility status        Time: 8997-8957 OT Time Calculation (min): 40 min  Charges: OT General Charges $OT Visit: 1 Visit OT Treatments $Self Care/Home Management : 38-52 mins  [name] OTR/L  Acute Rehab Services  [phone] office number   [name] 02/26/2024, 10:50 AM

## 2024-02-27 ENCOUNTER — Other Ambulatory Visit (HOSPITAL_COMMUNITY): Payer: Self-pay

## 2024-02-27 DIAGNOSIS — F10931 Alcohol use, unspecified with withdrawal delirium: Secondary | ICD-10-CM | POA: Diagnosis not present

## 2024-02-27 LAB — CBC WITH DIFFERENTIAL/PLATELET
Abs Immature Granulocytes: 0.03 K/uL (ref 0.00–0.07)
Basophils Absolute: 0 K/uL (ref 0.0–0.1)
Basophils Relative: 1 %
Eosinophils Absolute: 0.2 K/uL (ref 0.0–0.5)
Eosinophils Relative: 4 %
HCT: 21.8 % — ABNORMAL LOW (ref 39.0–52.0)
Hemoglobin: 7.1 g/dL — ABNORMAL LOW (ref 13.0–17.0)
Immature Granulocytes: 1 %
Lymphocytes Relative: 18 %
Lymphs Abs: 0.9 K/uL (ref 0.7–4.0)
MCH: 28.7 pg (ref 26.0–34.0)
MCHC: 32.6 g/dL (ref 30.0–36.0)
MCV: 88.3 fL (ref 80.0–100.0)
Monocytes Absolute: 1 K/uL (ref 0.1–1.0)
Monocytes Relative: 19 %
Neutro Abs: 3 K/uL (ref 1.7–7.7)
Neutrophils Relative %: 57 %
Platelets: 183 K/uL (ref 150–400)
RBC: 2.47 MIL/uL — ABNORMAL LOW (ref 4.22–5.81)
RDW: 19.9 % — ABNORMAL HIGH (ref 11.5–15.5)
WBC: 5.1 K/uL (ref 4.0–10.5)
nRBC: 0 % (ref 0.0–0.2)

## 2024-02-27 LAB — COMPREHENSIVE METABOLIC PANEL WITH GFR
ALT: 30 U/L (ref 0–44)
AST: 34 U/L (ref 15–41)
Albumin: 2.9 g/dL — ABNORMAL LOW (ref 3.5–5.0)
Alkaline Phosphatase: 43 U/L (ref 38–126)
Anion gap: 7 (ref 5–15)
BUN: 8 mg/dL (ref 6–20)
CO2: 26 mmol/L (ref 22–32)
Calcium: 8.1 mg/dL — ABNORMAL LOW (ref 8.9–10.3)
Chloride: 101 mmol/L (ref 98–111)
Creatinine, Ser: 0.57 mg/dL — ABNORMAL LOW (ref 0.61–1.24)
GFR, Estimated: >60 mL/min (ref >60)
Glucose, Bld: 92 mg/dL (ref 70–99)
Potassium: 3.2 mmol/L — ABNORMAL LOW (ref 3.5–5.1)
Sodium: 134 mmol/L — ABNORMAL LOW (ref 135–145)
Total Bilirubin: 0.3 mg/dL (ref 0.0–1.2)
Total Protein: 5.7 g/dL — ABNORMAL LOW (ref 6.5–8.1)

## 2024-02-27 LAB — PHOSPHORUS: Phosphorus: 4 mg/dL (ref 2.5–4.6)

## 2024-02-27 LAB — MAGNESIUM: Magnesium: 1.8 mg/dL (ref 1.7–2.4)

## 2024-02-27 MED ORDER — ARFORMOTEROL TARTRATE 15 MCG/2ML IN NEBU
15.0000 ug | INHALATION_SOLUTION | Freq: Two times a day (BID) | RESPIRATORY_TRACT | 0 refills | Status: DC
  Filled 2024-02-27: qty 120, 30d supply, fill #0

## 2024-02-27 MED ORDER — SEREVENT DISKUS 50 MCG/ACT IN AEPB: 1.0000 | INHALATION_SPRAY | Freq: Two times a day (BID) | RESPIRATORY_TRACT | 0 refills | Status: AC

## 2024-02-27 MED ORDER — PREDNISONE 10 MG (21) PO TBPK: ORAL_TABLET | ORAL | 0 refills | Status: AC

## 2024-02-27 MED ORDER — PANTOPRAZOLE SODIUM 40 MG PO TBEC
40.0000 mg | DELAYED_RELEASE_TABLET | Freq: Every day | ORAL | 0 refills | Status: AC
  Filled 2024-02-27: qty 30, 30d supply, fill #0

## 2024-02-27 MED ORDER — POTASSIUM CHLORIDE CRYS ER 20 MEQ PO TBCR
40.0000 meq | EXTENDED_RELEASE_TABLET | Freq: Four times a day (QID) | ORAL | Status: DC
  Administered 2024-02-27: 40 meq via ORAL
  Filled 2024-02-27: qty 2

## 2024-02-27 MED ORDER — IPRATROPIUM-ALBUTEROL 0.5-2.5 (3) MG/3ML IN SOLN
3.0000 mL | Freq: Four times a day (QID) | RESPIRATORY_TRACT | 0 refills | Status: AC | PRN
  Filled 2024-02-27: qty 360, 30d supply, fill #0

## 2024-02-27 MED ORDER — ADULT MULTIVITAMIN W/MINERALS CH
1.0000 | ORAL_TABLET | Freq: Every day | ORAL | 0 refills | Status: AC
  Filled 2024-02-27: qty 30, 30d supply, fill #0

## 2024-02-27 MED ORDER — THIAMINE HCL 100 MG PO TABS
100.0000 mg | ORAL_TABLET | Freq: Every day | ORAL | 0 refills | Status: AC
  Filled 2024-02-27: qty 30, 30d supply, fill #0

## 2024-02-27 MED ORDER — BUDESONIDE 0.5 MG/2ML IN SUSP
0.5000 mg | Freq: Two times a day (BID) | RESPIRATORY_TRACT | 0 refills | Status: AC
  Filled 2024-02-27: qty 120, 30d supply, fill #0

## 2024-02-27 MED ORDER — MONTELUKAST SODIUM 10 MG PO TABS
10.0000 mg | ORAL_TABLET | Freq: Every day | ORAL | 0 refills | Status: AC
  Filled 2024-02-27: qty 30, 30d supply, fill #0

## 2024-02-27 MED ORDER — FERROUS SULFATE 325 (65 FE) MG PO TABS
325.0000 mg | ORAL_TABLET | Freq: Two times a day (BID) | ORAL | 0 refills | Status: AC
  Filled 2024-02-27: qty 60, 30d supply, fill #0

## 2024-02-27 MED ORDER — SEREVENT DISKUS 50 MCG/ACT IN AEPB
1.0000 | INHALATION_SPRAY | Freq: Two times a day (BID) | RESPIRATORY_TRACT | 0 refills | Status: DC
  Filled 2024-02-27: qty 60, 30d supply, fill #0

## 2024-02-27 MED ORDER — FOLIC ACID 1 MG PO TABS
1.0000 mg | ORAL_TABLET | Freq: Every day | ORAL | 0 refills | Status: AC
  Filled 2024-02-27: qty 30, 30d supply, fill #0

## 2024-02-27 NOTE — Discharge Summary (Signed)
 Physician Discharge Summary  [name] FMW:[id number] DOB: October 26, 1965 DOA: 02/21/2024  PCP: Pcp, No  Admit date: 02/21/2024 Discharge date: 02/27/2024  Admitted From: (Home) Disposition:  (Home)  Recommendations for Outpatient Follow-up:  Follow up with PCP in 1-2 weeks Please obtain BMP/CBC in one week Please pursue iron  deficiency anemia workup as an outpatient.    Brief/Interim Summary: 58 year old M with PMH of asthma/COPD, alcohol  and tobacco use disorder, anxiety, depression, alcohol  induced polyneuropathy brought to ED by EMS with shortness of breath and found to be hypoxic to 86% on RA when fire fighter's found him. He was admitted with acute respiratory failure with hypoxia due to COPD exacerbation and severe DTs, initially required brief ICU stay as well for phenobarb taper, now stabilized and transferred to medical unit on 02/25/2024, was weaned off steroids, and alcohol  withdrawals has resolved.   Acute COPD exacerbation:  - Treated ceftriaxone , Singulair  and as needed DuoNebs, as well IV steroids, started on an oral Ellipta, he was using incentive spirometer, flutter valve, no further wheezing on discharge, he will be discharged on prednisone  taper, no need for antibiotics, DME with DuoNebs has been provided, he will be discharged on Serevent  and Pulmicort .     EtoH withdrawal/severe delirium tremens: Severe withdrawals, treated with phenobarbital  taper, and low-dose Librium , no further withdrawals at time of discharge, he was discharged on folic acid , thiamine  and multivitamins.     Hyponatremia/hypokalemia/hypomagnesemia, hypophosphatemia: Replaced, much improved  Iron  deficiency anemia: Present on admission, received Venofer  on 02/22/2024, now on oral iron  and folic acid  supplementation along with PPI, no signs of ongoing bleeding, postdischarge follow-up with PCP for age-appropriate outpatient iron  deficiency anemia workup -12 within normal limit   Tobacco use  disorder - Nicotine  patch - Counseled to quit   Lactic acidosis: Likely due to alcohol  dehydration, hydrated and improving clinically.    Discharge Diagnoses:  Principal Problem:   Alcohol  withdrawal [facility])    Discharge Instructions  Discharge Instructions     Diet - low sodium heart healthy   Complete by: As directed    Discharge instructions   Complete by: As directed    Follow with Primary MD  in 7 days   Get CBC, CMP,  checked  by Primary MD next visit.    Activity: As tolerated with Full fall precautions use walker/cane & assistance as needed   Disposition Home    Diet: Regular diet   On your next visit with your primary care physician please Get Medicines reviewed and adjusted.   Please request your Prim.MD to go over all Hospital Tests and Procedure/Radiological results at the follow up, please get all Hospital records sent to your Prim MD by signing hospital release before you go home.   If you experience worsening of your admission symptoms, develop shortness of breath, life threatening emergency, suicidal or homicidal thoughts you must seek medical attention immediately by calling 911 or calling your MD immediately  if symptoms less severe.  You Must read complete instructions/literature along with all the possible adverse reactions/side effects for all the Medicines you take and that have been prescribed to you. Take any new Medicines after you have completely understood and accpet all the possible adverse reactions/side effects.   Do not drive, operating heavy machinery, perform activities at heights, swimming or participation in water activities or provide baby sitting services if your were admitted for syncope or siezures until you have seen by Primary MD or a Neurologist and advised to do so again.  Do not drive when taking Pain medications.    Do not take more than prescribed Pain, Sleep and Anxiety Medications  Special Instructions: If you have  smoked or chewed Tobacco  in the last 2 yrs please stop smoking, stop any regular Alcohol   and or any Recreational drug use.  Wear Seat belts while driving.   Please note  You were cared for by a hospitalist during your hospital stay. If you have any questions about your discharge medications or the care you received while you were in the hospital after you are discharged, you can call the unit and asked to speak with the hospitalist on call if the hospitalist that took care of you is not available. Once you are discharged, your primary care physician will handle any further medical issues. Please note that NO REFILLS for any discharge medications will be authorized once you are discharged, as it is imperative that you return to your primary care physician (or establish a relationship with a primary care physician if you do not have one) for your aftercare needs so that they can reassess your need for medications and monitor your lab values.   Increase activity slowly   Complete by: As directed       Allergies as of 02/27/2024   No Known Allergies      Medication List     STOP taking these medications    chlordiazePOXIDE  25 MG capsule Commonly known as: LIBRIUM    doxycycline  100 MG capsule Commonly known as: VIBRAMYCIN    gabapentin  300 MG capsule Commonly known as: NEURONTIN    PHENobarbital  32.4 MG tablet Commonly known as: LUMINAL   tiZANidine  2 MG tablet Commonly known as: ZANAFLEX        TAKE these medications    albuterol  108 (90 Base) MCG/ACT inhaler Commonly known as: VENTOLIN  HFA Inhale 2 puffs into the lungs every 4 (four) hours as needed for wheezing or shortness of breath.   budesonide  0.5 MG/2ML nebulizer solution Commonly known as: PULMICORT  Take 2 mLs (0.5 mg total) by nebulization 2 (two) times daily.   CertaVite/Antioxidants Tabs Take 1 tablet by mouth daily. Start taking on: February 28, 2024   FeroSul 325 (65 Fe) MG tablet Generic drug: ferrous  sulfate Take 1 tablet (325 mg total) by mouth 2 (two) times daily with a meal.   folic acid  1 MG tablet Commonly known as: FOLVITE  Take 1 tablet (1 mg total) by mouth daily. Start taking on: February 28, 2024   ipratropium-albuterol  0.5-2.5 (3) MG/3ML Soln Commonly known as: DUONEB Take 3 mLs by nebulization every 6 (six) hours as needed.   montelukast  10 MG tablet Commonly known as: SINGULAIR  Take 1 tablet (10 mg total) by mouth at bedtime.   pantoprazole  40 MG tablet Commonly known as: Protonix  Take 1 tablet (40 mg total) by mouth daily.   predniSONE  10 MG (21) Tbpk tablet Commonly known as: STERAPRED UNI-PAK 21 TAB Use per package instruction   Serevent  Diskus 50 MCG/ACT diskus inhaler Generic drug: salmeterol Inhale 1 puff into the lungs 2 (two) times daily.   thiamine  100 MG tablet Commonly known as: VITAMIN B1 Take 1 tablet (100 mg total) by mouth daily. Start taking on: February 28, 2024               Durable Medical Equipment  (From admission, onward)           Start     Ordered   02/27/24 1151  For home use only DME Other see  comment  Once       Comments: Tub Bench  Question:  Length of Need  Answer:  12 Months   02/27/24 1151   02/27/24 1022  For home use only DME Walker rolling  Once       Question Answer Comment  Walker: With 5 Inch Wheels   Patient needs a walker to treat with the following condition Generalized weakness      02/27/24 1022   02/27/24 1017  For home use only DME Nebulizer machine  Once       Question Answer Comment  Patient needs a nebulizer to treat with the following condition COPD (chronic obstructive pulmonary disease) (HCC)   Length of Need 12 Months   Additional equipment included Administration kit      02/27/24 1016            Follow-up Information     [facility], [place] Follow up.   Why: SOmeone from [facility] will contact you to arrange start date and time for your  physical therapy. Contact information: [address] [place] [address] [phone]                No Known Allergies  Consultations: PCCM  Procedures/Studies: DG Chest Port 1 View Result Date: 02/25/2024 CLINICAL DATA:  Shortness of breath. EXAM: PORTABLE CHEST 1 VIEW COMPARISON:  02/21/2024 FINDINGS: Cardiopericardial silhouette is at upper limits of normal for size. The lungs are clear without focal pneumonia, edema, pneumothorax or pleural effusion. Streaky/linear density at the bases suggest atelectasis. No acute bony abnormality. Telemetry leads overlie the chest. IMPRESSION: Streaky/linear density at the bases suggest atelectasis. Electronically Signed   By: Camellia Candle M.D.   On: 02/25/2024 06:44   DG Chest Port 1 View Result Date: 02/21/2024 CLINICAL DATA:  Shortness of breath. EXAM: PORTABLE CHEST 1 VIEW COMPARISON:  February 17, 2023 FINDINGS: The heart size and mediastinal contours are within normal limits. Trace atelectatic changes are seen within the bilateral lung bases. There is no evidence of acute infiltrate, pleural effusion or pneumothorax. The visualized skeletal structures are unremarkable. IMPRESSION: No acute cardiopulmonary disease. Electronically Signed   By: Suzen Dials M.D.   On: 02/21/2024 01:44      Subjective:  Significant events overnight, eager to go home, no evidence of withdrawals, ambulating in the hallway with no dyspnea or hypoxia Discharge Exam: Vitals:   02/27/24 0813 02/27/24 0834  BP: (!) 144/85   Pulse: 76   Resp: 16   Temp: 97.7 F (36.5 C)   SpO2: 92% 98%   Vitals:   02/27/24 0200 02/27/24 0400 02/27/24 0813 02/27/24 0834  BP:  112/76 (!) 144/85   Pulse: 78 84 76   Resp: (!) 8 15 16    Temp:  98.2 F (36.8 C) 97.7 F (36.5 C)   TempSrc:  Oral Oral   SpO2: 97% 100% 92% 98%  Weight:      Height:        General: Pt is alert, awake, not in acute distress Cardiovascular: RRR, S1/S2 +, no rubs, no  gallops Respiratory: CTA bilaterally, no wheezing, no rhonchi Abdominal: Soft, NT, ND, bowel sounds + Extremities: no edema, no cyanosis    The results of significant diagnostics from this hospitalization (including imaging, microbiology, ancillary and laboratory) are listed below for reference.     Microbiology: Recent Results (from the past 240 hours)  Resp panel by RT-PCR (RSV, Flu A&B, Covid)  Anterior Nasal Swab     Status: None   Collection Time: 02/21/24  1:27 AM   Specimen: Anterior Nasal Swab  Result Value Ref Range Status   SARS Coronavirus 2 by RT PCR NEGATIVE NEGATIVE Final   Influenza A by PCR NEGATIVE NEGATIVE Final   Influenza B by PCR NEGATIVE NEGATIVE Final    Comment: (NOTE) The Xpert Xpress SARS-CoV-2/FLU/RSV plus assay is intended as an aid in the diagnosis of influenza from Nasopharyngeal swab specimens and should not be used as a sole basis for treatment. Nasal washings and aspirates are unacceptable for Xpert Xpress SARS-CoV-2/FLU/RSV testing.  Fact Sheet for Patients: BloggerCourse.com  Fact Sheet for Healthcare Providers: SeriousBroker.it  This test is not yet approved or cleared by the [place]  FDA and has been authorized for detection and/or diagnosis of SARS-CoV-2 by FDA under an Emergency Use Authorization (EUA). This EUA will remain in effect (meaning this test can be used) for the duration of the COVID-19 declaration under Section 564(b)(1) of the Act, 21 U.S.C. section 360bbb-3(b)(1), unless the authorization is terminated or revoked.     Resp Syncytial Virus by PCR NEGATIVE NEGATIVE Final    Comment: (NOTE) Fact Sheet for Patients: BloggerCourse.com  Fact Sheet for Healthcare Providers: SeriousBroker.it  This test is not yet approved or cleared by the [place]  FDA and has been authorized for detection and/or diagnosis of  SARS-CoV-2 by FDA under an Emergency Use Authorization (EUA). This EUA will remain in effect (meaning this test can be used) for the duration of the COVID-19 declaration under Section 564(b)(1) of the Act, 21 U.S.C. section 360bbb-3(b)(1), unless the authorization is terminated or revoked.  Performed at [facility], [address], [place], [place] [address]   MRSA Next Gen by PCR, Nasal     Status: None   Collection Time: 02/23/24  9:03 AM   Specimen: Nasal Mucosa; Nasal Swab  Result Value Ref Range Status   MRSA by PCR Next Gen NOT DETECTED NOT DETECTED Final    Comment: (NOTE) The GeneXpert MRSA Assay (FDA approved for NASAL specimens only), is one component of a comprehensive MRSA colonization surveillance program. It is not intended to diagnose MRSA infection nor to guide or monitor treatment for MRSA infections. Test performance is not FDA approved in patients less than 35 years old. Performed at [facility], [address] [place], [place] [address]      Labs: BNP (last 3 results) Recent Labs    02/25/24 0552  BNP 80.1   Basic Metabolic Panel: Recent Labs  Lab 02/23/24 0835 02/24/24 0342 02/25/24 0552 02/26/24 0605 02/27/24 0448  NA 143 139 134* 134* 134*  K 3.5 3.0* 4.0 3.4* 3.2*  CL 110 108 99 103 101  CO2 20* 22 24 24 26   GLUCOSE 83 78 93 92 92  BUN 10 9 12 6 8   CREATININE 1.03 0.55* 0.64 0.51* 0.57*  CALCIUM 8.9 8.4* 8.8* 8.8* 8.1*  MG 2.0 1.9 1.8 1.6* 1.8  PHOS 3.6 4.5 3.5 3.9 4.0   Liver Function Tests: Recent Labs  Lab 02/22/24 1215 02/23/24 0835 02/25/24 0552 02/26/24 0605 02/27/24 0448  AST 50* 46* 36 32 34  ALT 29 25 26 26 30   ALKPHOS 61 48 43 42 43  BILITOT 0.9 0.7 0.5 0.5 0.3  PROT 7.4 6.5 5.9* 6.1* 5.7*  ALBUMIN 3.8 3.4* 3.1* 3.1* 2.9*   No results for input(s): LIPASE, AMYLASE in the last 168 hours. Recent Labs  Lab  02/25/24 0649  AMMONIA <13   CBC: Recent Labs  Lab 02/23/24 0835 02/24/24 0342  02/25/24 0552 02/26/24 0605 02/27/24 0448  WBC 8.3 5.9 6.4 6.3 5.1  NEUTROABS  --   --  4.5 4.1 3.0  HGB 7.1* 7.0* 7.6* 7.5* 7.1*  HCT 22.1* 21.8* 23.8* 22.7* 21.8*  MCV 86.0 88.6 88.8 86.6 88.3  PLT 198 165 184 169 183   Cardiac Enzymes: No results for input(s): CKTOTAL, CKMB, CKMBINDEX, TROPONINI in the last 168 hours. BNP: Invalid input(s): POCBNP CBG: No results for input(s): GLUCAP in the last 168 hours. D-Dimer No results for input(s): DDIMER in the last 72 hours. Hgb A1c No results for input(s): HGBA1C in the last 72 hours. Lipid Profile No results for input(s): CHOL, HDL, LDLCALC, TRIG, CHOLHDL, LDLDIRECT in the last 72 hours. Thyroid  function studies No results for input(s): TSH, T4TOTAL, T3FREE, THYROIDAB in the last 72 hours.  Invalid input(s): FREET3 Anemia work up No results for input(s): VITAMINB12, FOLATE, FERRITIN, TIBC, IRON , RETICCTPCT in the last 72 hours. Urinalysis    Component Value Date/Time   COLORURINE COLORLESS (A) 02/21/2024 0422   APPEARANCEUR CLEAR 02/21/2024 0422   LABSPEC 1.002 (L) 02/21/2024 0422   PHURINE 5.0 02/21/2024 0422   GLUCOSEU NEGATIVE 02/21/2024 0422   HGBUR NEGATIVE 02/21/2024 0422   BILIRUBINUR NEGATIVE 02/21/2024 0422   KETONESUR NEGATIVE 02/21/2024 0422   PROTEINUR NEGATIVE 02/21/2024 0422   UROBILINOGEN 0.2 06/19/2013 2025   NITRITE NEGATIVE 02/21/2024 0422   LEUKOCYTESUR NEGATIVE 02/21/2024 0422   Sepsis Labs Recent Labs  Lab 02/24/24 0342 02/25/24 0552 02/26/24 0605 02/27/24 0448  WBC 5.9 6.4 6.3 5.1   Microbiology Recent Results (from the past 240 hours)  Resp panel by RT-PCR (RSV, Flu A&B, Covid) Anterior Nasal Swab     Status: None   Collection Time: 02/21/24  1:27 AM   Specimen: Anterior Nasal Swab  Result Value Ref Range Status   SARS Coronavirus 2 by RT PCR NEGATIVE NEGATIVE Final   Influenza A by PCR NEGATIVE NEGATIVE Final   Influenza B by PCR  NEGATIVE NEGATIVE Final    Comment: (NOTE) The Xpert Xpress SARS-CoV-2/FLU/RSV plus assay is intended as an aid in the diagnosis of influenza from Nasopharyngeal swab specimens and should not be used as a sole basis for treatment. Nasal washings and aspirates are unacceptable for Xpert Xpress SARS-CoV-2/FLU/RSV testing.  Fact Sheet for Patients: BloggerCourse.com  Fact Sheet for Healthcare Providers: SeriousBroker.it  This test is not yet approved or cleared by the [place]  FDA and has been authorized for detection and/or diagnosis of SARS-CoV-2 by FDA under an Emergency Use Authorization (EUA). This EUA will remain in effect (meaning this test can be used) for the duration of the COVID-19 declaration under Section 564(b)(1) of the Act, 21 U.S.C. section 360bbb-3(b)(1), unless the authorization is terminated or revoked.     Resp Syncytial Virus by PCR NEGATIVE NEGATIVE Final    Comment: (NOTE) Fact Sheet for Patients: BloggerCourse.com  Fact Sheet for Healthcare Providers: SeriousBroker.it  This test is not yet approved or cleared by the [place]  FDA and has been authorized for detection and/or diagnosis of SARS-CoV-2 by FDA under an Emergency Use Authorization (EUA). This EUA will remain in effect (meaning this test can be used) for the duration of the COVID-19 declaration under Section 564(b)(1) of the Act, 21 U.S.C. section 360bbb-3(b)(1), unless the authorization is terminated or revoked.  Performed at [facility], [address] [place], [place] [address]  MRSA Next Gen by PCR, Nasal     Status: None   Collection Time: 02/23/24  9:03 AM   Specimen: Nasal Mucosa; Nasal Swab  Result Value Ref Range Status   MRSA by PCR Next Gen NOT DETECTED NOT DETECTED Final    Comment: (NOTE) The GeneXpert MRSA Assay (FDA approved for NASAL specimens only), is one  component of a comprehensive MRSA colonization surveillance program. It is not intended to diagnose MRSA infection nor to guide or monitor treatment for MRSA infections. Test performance is not FDA approved in patients less than 68 years old. Performed at [facility], [address], [place], [place] [address]      Time coordinating discharge: Over 30 minutes  SIGNED:   Brayton Lye, MD  [facility] 02/27/2024, 4:09 PM Pager   If 7PM-7AM, please contact night-coverage [email or web address] Password [email or web address]

## 2024-02-27 NOTE — TOC Progression Note (Signed)
 Transition of Care [facility]) - Progression Note    Patient Details  Name: [name] MRN: [id number] Date of Birth: 1966/02/26  Transition of Care [facility]) CM/SW Contact  Nola Devere Hands, RN Phone Number: 02/27/2024, 11:05 AM  Clinical Narrative:    Case manager contacted [facility] Liaison with referral for Home Health. Request approved by her leadership.    Expected Discharge Plan: Home w Home Health Services Barriers to Discharge: No Barriers Identified  Expected Discharge Plan and Services   Discharge Planning Services: CM Consult Post Acute Care Choice: Durable Medical Equipment, Home Health Living arrangements for the past 2 months: Single Family Home Expected Discharge Date: 02/27/24               DME Arranged: Nebulizer machine, Walker rolling, Tub bench         HH Arranged: PT HH Agency: Other - See comment Production assistant, radio Marena)) Date HH Agency Contacted: 02/27/24 Time HH Agency Contacted: 1020 Representative spoke with at [facility] Agency: Jon   Social Determinants of Health (SDOH) Interventions SDOH Screenings   Food Insecurity: Food Insecurity Present (02/22/2024)  Housing: Low Risk  (02/22/2024)  Transportation Needs: No Transportation Needs (02/22/2024)  Utilities: Not At Risk (02/22/2024)  Alcohol  Screen: Medium Risk (06/14/2021)  Depression (PHQ2-9): Low Risk  (09/14/2021)  Financial Resource Strain: Medium Risk (09/06/2022)  Tobacco Use: High Risk (02/21/2024)    Readmission Risk Interventions     No data to display

## 2024-02-27 NOTE — Progress Notes (Signed)
   02/27/24 1101  Mobility  Activity Ambulated with assistance in hallway  Level of Assistance Standby assist, set-up cues, supervision of patient - no hands on  Assistive Device Front wheel walker  Distance Ambulated (ft) 350 ft  Activity Response Tolerated fair  Mobility Referral Yes  Mobility visit 1 Mobility  Mobility Specialist Start Time (ACUTE ONLY) 1101  Mobility Specialist Stop Time (ACUTE ONLY) 1118  Mobility Specialist Time Calculation (min) (ACUTE ONLY) 17 min   Mobility Specialist: Progress Note  Pre-Mobility:      HR 100 During Mobility: HR 133 Post-Mobility:    HR 100  Pt agreeable to mobility session - received in chair. Pt with increased tremors towards EOS. Returned to bed with all needs met - call bell within reach. Chair alarm on.   Virgle Boards, BS Mobility Specialist Please contact via SecureChat or  Rehab office at [phone].

## 2024-02-27 NOTE — Plan of Care (Signed)

## 2024-02-28 ENCOUNTER — Telehealth: Payer: Self-pay

## 2024-02-28 NOTE — Transitions of Care (Post Inpatient/ED Visit) (Signed)
   02/28/2024  Name: [name] MRN: [id number] DOB: August 20, 1966  Today's TOC FU Call Status: Today's TOC FU Call Status:: Unsuccessful Call (1st Attempt) Unsuccessful Call (1st Attempt) Date: 02/28/24  Attempted to reach the patient regarding the most recent Inpatient/ED visit.  Follow Up Plan: Additional outreach attempts will be made to reach the patient to complete the Transitions of Care (Post Inpatient/ED visit) call.   Makilah Dowda J. Danis Pembleton RN, MSN [facility], [facility] Health RN Care Manager Direct Dial: [phone]  Fax: [phone] Website: [email or web address]

## 2024-02-29 ENCOUNTER — Telehealth: Payer: Self-pay

## 2024-02-29 NOTE — Transitions of Care (Post Inpatient/ED Visit) (Signed)
   02/29/2024  Name: [name] MRN: [id number] DOB: June 04, 1966  Today's TOC FU Call Status: Today's TOC FU Call Status:: Unsuccessful Call (2nd Attempt) Unsuccessful Call (2nd Attempt) Date: 02/29/24  Attempted to reach the patient regarding the most recent Inpatient/ED visit.  Follow Up Plan: Additional outreach attempts will be made to reach the patient to complete the Transitions of Care (Post Inpatient/ED visit) call.   [name] J. Guy Seese RN, MSN [facility], [facility] Health RN Care Manager Direct Dial: [phone]  Fax: [phone] Website: [email or web address]

## 2024-03-03 ENCOUNTER — Telehealth: Payer: Self-pay

## 2024-03-03 NOTE — Transitions of Care (Post Inpatient/ED Visit) (Signed)
   03/03/2024  Name: DONN ZANETTI MRN: [id number] DOB: [birth year]  Today's TOC FU Call Status: Today's TOC FU Call Status:: Unsuccessful Call (3rd Attempt) Unsuccessful Call (3rd Attempt) Date: 03/03/24  Attempted to reach the patient regarding the most recent Inpatient/ED visit.  Follow Up Plan: No further outreach attempts will be made at this time. We have been unable to contact the patient.  Breahna Boylen J. Kihanna Kamiya RN, MSN [facility], [facility] Health RN Care Manager Direct Dial: [phone]  Fax: [phone] Website: [email or web address]
